# Patient Record
Sex: Male | Born: 1940 | Race: White | Hispanic: No | Marital: Married | Smoking: Former smoker
Health system: Southern US, Community
[De-identification: ages and names within clinical notes are randomized; demographics above are authoritative.]

## PROBLEM LIST (undated history)

## (undated) DIAGNOSIS — K219 Gastro-esophageal reflux disease without esophagitis: Secondary | ICD-10-CM

## (undated) DIAGNOSIS — C61 Malignant neoplasm of prostate: Secondary | ICD-10-CM

## (undated) DIAGNOSIS — Z87442 Personal history of urinary calculi: Secondary | ICD-10-CM

## (undated) DIAGNOSIS — IMO0001 Reserved for inherently not codable concepts without codable children: Secondary | ICD-10-CM

## (undated) DIAGNOSIS — K449 Diaphragmatic hernia without obstruction or gangrene: Secondary | ICD-10-CM

## (undated) DIAGNOSIS — E8881 Metabolic syndrome: Secondary | ICD-10-CM

## (undated) DIAGNOSIS — G4700 Insomnia, unspecified: Secondary | ICD-10-CM

## (undated) DIAGNOSIS — Z974 Presence of external hearing-aid: Secondary | ICD-10-CM

## (undated) DIAGNOSIS — K589 Irritable bowel syndrome without diarrhea: Secondary | ICD-10-CM

## (undated) DIAGNOSIS — N189 Chronic kidney disease, unspecified: Secondary | ICD-10-CM

## (undated) DIAGNOSIS — I350 Nonrheumatic aortic (valve) stenosis: Secondary | ICD-10-CM

## (undated) DIAGNOSIS — I10 Essential (primary) hypertension: Secondary | ICD-10-CM

## (undated) DIAGNOSIS — H9190 Unspecified hearing loss, unspecified ear: Secondary | ICD-10-CM

## (undated) DIAGNOSIS — E669 Obesity, unspecified: Secondary | ICD-10-CM

## (undated) DIAGNOSIS — N200 Calculus of kidney: Secondary | ICD-10-CM

## (undated) DIAGNOSIS — I447 Left bundle-branch block, unspecified: Secondary | ICD-10-CM

## (undated) DIAGNOSIS — M1990 Unspecified osteoarthritis, unspecified site: Secondary | ICD-10-CM

## (undated) DIAGNOSIS — E785 Hyperlipidemia, unspecified: Secondary | ICD-10-CM

## (undated) DIAGNOSIS — F17201 Nicotine dependence, unspecified, in remission: Secondary | ICD-10-CM

## (undated) DIAGNOSIS — E039 Hypothyroidism, unspecified: Secondary | ICD-10-CM

## (undated) DIAGNOSIS — J302 Other seasonal allergic rhinitis: Secondary | ICD-10-CM

## (undated) DIAGNOSIS — R011 Cardiac murmur, unspecified: Secondary | ICD-10-CM

## (undated) DIAGNOSIS — I639 Cerebral infarction, unspecified: Secondary | ICD-10-CM

## (undated) DIAGNOSIS — K6289 Other specified diseases of anus and rectum: Secondary | ICD-10-CM

## (undated) DIAGNOSIS — N400 Enlarged prostate without lower urinary tract symptoms: Secondary | ICD-10-CM

## (undated) DIAGNOSIS — K5790 Diverticulosis of intestine, part unspecified, without perforation or abscess without bleeding: Secondary | ICD-10-CM

## (undated) DIAGNOSIS — K649 Unspecified hemorrhoids: Secondary | ICD-10-CM

## (undated) HISTORY — DX: Hyperlipidemia, unspecified: E78.5

## (undated) HISTORY — DX: Metabolic syndrome: E88.810

## (undated) HISTORY — DX: Metabolic syndrome: E88.81

## (undated) HISTORY — DX: Nicotine dependence, unspecified, in remission: F17.201

## (undated) HISTORY — DX: Essential (primary) hypertension: I10

## (undated) HISTORY — DX: Nonrheumatic aortic (valve) stenosis: I35.0

## (undated) HISTORY — PX: ANKLE FRACTURE SURGERY: SHX122

## (undated) HISTORY — DX: Chronic kidney disease, unspecified: N18.9

## (undated) HISTORY — DX: Calculus of kidney: N20.0

## (undated) HISTORY — PX: TONSILLECTOMY: SUR1361

## (undated) HISTORY — DX: Insomnia, unspecified: G47.00

## (undated) HISTORY — DX: Benign prostatic hyperplasia without lower urinary tract symptoms: N40.0

## (undated) HISTORY — DX: Diverticulosis of intestine, part unspecified, without perforation or abscess without bleeding: K57.90

## (undated) HISTORY — DX: Left bundle-branch block, unspecified: I44.7

## (undated) HISTORY — DX: Obesity, unspecified: E66.9

## (undated) HISTORY — PX: CHOLECYSTECTOMY: SHX55

## (undated) HISTORY — DX: Diaphragmatic hernia without obstruction or gangrene: K44.9

## (undated) HISTORY — DX: Other specified diseases of anus and rectum: K62.89

## (undated) HISTORY — DX: Malignant neoplasm of prostate: C61

## (undated) HISTORY — DX: Unspecified hemorrhoids: K64.9

## (undated) HISTORY — PX: CATARACT EXTRACTION: SUR2

---

## 1998-05-26 DIAGNOSIS — I350 Nonrheumatic aortic (valve) stenosis: Secondary | ICD-10-CM

## 1998-05-26 HISTORY — DX: Nonrheumatic aortic (valve) stenosis: I35.0

## 2001-04-19 ENCOUNTER — Encounter: Payer: Self-pay | Admitting: Family Medicine

## 2001-04-19 ENCOUNTER — Ambulatory Visit (HOSPITAL_COMMUNITY): Admission: RE | Admit: 2001-04-19 | Discharge: 2001-04-19 | Payer: Self-pay | Admitting: Family Medicine

## 2004-03-07 ENCOUNTER — Encounter (HOSPITAL_COMMUNITY): Admission: RE | Admit: 2004-03-07 | Discharge: 2004-04-06 | Payer: Self-pay | Admitting: Family Medicine

## 2004-05-26 DIAGNOSIS — K649 Unspecified hemorrhoids: Secondary | ICD-10-CM

## 2004-05-26 DIAGNOSIS — K449 Diaphragmatic hernia without obstruction or gangrene: Secondary | ICD-10-CM

## 2004-05-26 HISTORY — PX: COLONOSCOPY: SHX174

## 2004-05-26 HISTORY — PX: ESOPHAGOGASTRODUODENOSCOPY: SHX1529

## 2004-05-26 HISTORY — DX: Unspecified hemorrhoids: K64.9

## 2004-05-26 HISTORY — DX: Diaphragmatic hernia without obstruction or gangrene: K44.9

## 2004-08-13 ENCOUNTER — Encounter: Admission: RE | Admit: 2004-08-13 | Discharge: 2004-08-13 | Payer: Self-pay | Admitting: Gastroenterology

## 2004-08-30 ENCOUNTER — Encounter: Admission: RE | Admit: 2004-08-30 | Discharge: 2004-08-30 | Payer: Self-pay | Admitting: Gastroenterology

## 2005-03-05 ENCOUNTER — Other Ambulatory Visit: Admission: RE | Admit: 2005-03-05 | Discharge: 2005-03-05 | Payer: Self-pay | Admitting: Dermatology

## 2006-03-11 ENCOUNTER — Ambulatory Visit (HOSPITAL_COMMUNITY): Admission: RE | Admit: 2006-03-11 | Discharge: 2006-03-11 | Payer: Self-pay | Admitting: Urology

## 2009-08-24 DIAGNOSIS — N200 Calculus of kidney: Secondary | ICD-10-CM

## 2009-08-24 HISTORY — DX: Calculus of kidney: N20.0

## 2009-08-26 ENCOUNTER — Emergency Department (HOSPITAL_COMMUNITY): Admission: EM | Admit: 2009-08-26 | Discharge: 2009-08-26 | Payer: Self-pay | Admitting: Emergency Medicine

## 2009-08-26 ENCOUNTER — Encounter: Payer: Self-pay | Admitting: Cardiology

## 2009-08-26 LAB — CONVERTED CEMR LAB
CO2: 24 meq/L
Calcium: 9.7 mg/dL
Chloride: 104 meq/L
HCT: 45.8 %
Hemoglobin: 15.8 g/dL
Platelets: 205 10*3/uL
WBC: 11 10*3/uL

## 2010-05-26 DIAGNOSIS — I447 Left bundle-branch block, unspecified: Secondary | ICD-10-CM

## 2010-05-26 HISTORY — DX: Left bundle-branch block, unspecified: I44.7

## 2010-06-03 ENCOUNTER — Ambulatory Visit (HOSPITAL_COMMUNITY)
Admission: RE | Admit: 2010-06-03 | Discharge: 2010-06-03 | Payer: Self-pay | Source: Home / Self Care | Attending: Family Medicine | Admitting: Family Medicine

## 2010-06-06 ENCOUNTER — Encounter (INDEPENDENT_AMBULATORY_CARE_PROVIDER_SITE_OTHER): Payer: Self-pay | Admitting: *Deleted

## 2010-06-06 LAB — CONVERTED CEMR LAB
ALT: 42 units/L
AST: 27 units/L
Alkaline Phosphatase: 60 units/L
BUN: 15 mg/dL
Calcium: 9.4 mg/dL
Chloride: 105 meq/L
Cholesterol: 170 mg/dL
Creatinine, Ser: 1.15 mg/dL
Hgb A1c MFr Bld: 5.9 %
Sodium: 140 meq/L
Triglycerides: 177 mg/dL

## 2010-06-28 ENCOUNTER — Encounter (INDEPENDENT_AMBULATORY_CARE_PROVIDER_SITE_OTHER): Payer: Self-pay | Admitting: *Deleted

## 2010-07-02 ENCOUNTER — Encounter: Payer: Self-pay | Admitting: Cardiology

## 2010-07-02 ENCOUNTER — Ambulatory Visit (INDEPENDENT_AMBULATORY_CARE_PROVIDER_SITE_OTHER): Payer: Medicare Other | Admitting: Cardiology

## 2010-07-02 DIAGNOSIS — R9431 Abnormal electrocardiogram [ECG] [EKG]: Secondary | ICD-10-CM

## 2010-07-02 DIAGNOSIS — I359 Nonrheumatic aortic valve disorder, unspecified: Secondary | ICD-10-CM

## 2010-07-02 DIAGNOSIS — E669 Obesity, unspecified: Secondary | ICD-10-CM | POA: Insufficient documentation

## 2010-07-02 DIAGNOSIS — N200 Calculus of kidney: Secondary | ICD-10-CM | POA: Insufficient documentation

## 2010-07-02 DIAGNOSIS — G4700 Insomnia, unspecified: Secondary | ICD-10-CM | POA: Insufficient documentation

## 2010-07-03 ENCOUNTER — Encounter: Payer: Self-pay | Admitting: Cardiology

## 2010-07-03 NOTE — Miscellaneous (Signed)
Summary: LABS CMP,LIPIDS,A1C,06/06/2009  Clinical Lists Changes  Observations: Added new observation of CALCIUM: 9.4 mg/dL (60/45/4098 11:91) Added new observation of ALBUMIN: 4.2 g/dL (47/82/9562 13:08) Added new observation of PROTEIN, TOT: 6.9 g/dL (65/78/4696 29:52) Added new observation of SGPT (ALT): 42 units/L (06/06/2010 10:08) Added new observation of SGOT (AST): 27 units/L (06/06/2010 10:08) Added new observation of ALK PHOS: 60 units/L (06/06/2010 10:08) Added new observation of CREATININE: 1.15 mg/dL (84/13/2440 10:27) Added new observation of BUN: 15 mg/dL (25/36/6440 34:74) Added new observation of BG RANDOM: 122 mg/dL (25/95/6387 56:43) Added new observation of CO2 PLSM/SER: 25 meq/L (06/06/2010 10:08) Added new observation of CL SERUM: 105 meq/L (06/06/2010 10:08) Added new observation of K SERUM: 4.3 meq/L (06/06/2010 10:08) Added new observation of NA: 140 meq/L (06/06/2010 10:08) Added new observation of LDL: 101 mg/dL (32/95/1884 16:60) Added new observation of HDL: 34 mg/dL (63/05/6008 93:23) Added new observation of TRIGLYC TOT: 177 mg/dL (55/73/2202 54:27) Added new observation of CHOLESTEROL: 170 mg/dL (11/16/7626 31:51) Added new observation of HGBA1C: 5.9 % (06/06/2010 10:08)

## 2010-07-17 NOTE — Assessment & Plan Note (Signed)
Summary: [facility]   Visit Type:  Initial Consult Primary Provider:  Dr. Katharine Look   History of Present Illness: [name] is seen at the kind request of Dr. Sudie Bailey for evaluation of aortic valve disease and left bundle branch block.  This nice gentleman was previously seen by Dr. Daleen Squibb for approximately 10 years ago after experiencing an episode of diaphoresis, nausea and near syncope during a highly stressful conversation at work.  Noninvasive assessment at that time was apparently normal.  Records have been requested, but are not yet available.  Since then, he has done quite well.  He has been told of a murmur for some years, but has never previously been told of an abnormal EKG.  No prior EKG tracings are available in the [facility] system.  CT Scan  Procedure date:  08/26/2009  Findings:       CT ABDOMEN AND PELVIS WITHOUT CONTRAST   IMPRESSION:   2-3 mm stone identified in the bladder, just to the right at the   UVJ.  Given the position, this is probably in the bladder lumen   although it is possible it could still be just barely in the UVJ.   There is some edema around the right ureter, but no evidence for   hydronephrosis at this time.    Read By:  Kennith Center,  M.D.  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  THE LEFT VERTEBRAL IS DOMINANT.  THE CAROTID BIFURCATIONS APPEAR UNREMARKABLE.  MILD POSTERIOR WALL PLAQUING OF THE PROXIMAL RIGHT INTERNAL CAROTID ARTERY.  NO SIGNIFICANT STENOSIS IS DETECTED.  THERE IS NO EVIDENCE OF UNUSUAL COURSE OF THE ARTERIES.  NO EVIDENCE OF  DISSECTION.   IMPRESSION:  MILD STENOSIS OF PROXIMAL RIGHT ICA, LESS   THAN 30%     VO:HYWVPX E. Frazier Richards, M.D.  EKG  Procedure date:  07/02/2010  Findings:      Normal sinus rhythm with PVCs Borderline left atrial abnormality Left axis deviation Left bundle branch block No previous tracing for comparison  -  Date:  08/26/2009    WBC: 11    HGB: 15.8    HCT: 45.8    PLT: 205    MCV: 92   Current Medications (verified): 1)  Terazosin Hcl 5 Mg Caps (Terazosin Hcl) .... Take 1 Tab Daily 2)  Alprazolam 0.5 Mg Tabs (Alprazolam) .... Take 1 Tab Qid 3)  Vitamin C Cr 500 Mg Cr-Caps (Ascorbic Acid) .... Take 1 Tab Daily 4)  Acyclovir 800 Mg Tabs (Acyclovir) .... Take 1 Tab Daily 5)  Vitamin D3 1000 Unit Tabs (Cholecalciferol) .... Take 1 Tab Daily 6)  Aspirin 325 Mg Tabs (Aspirin) .... Take 1 Tab Daily 7)  Omega-3 Fish Oil 1200 Mg Caps (Omega-3 Fatty Acids) .... Take 1 Tab Daily 8)  Melatonin 3 Mg Tabs (Melatonin) .... Take 1 Tab Daily 9)  Meclizine Hcl 25 Mg Tabs (Meclizine Hcl) .... Take 1 Tab Daily 10)  Glycopyrrolate 2 Mg Tabs (Glycopyrrolate) .... As Needed 11)  Robinul-Forte 2 Mg Tabs (Glycopyrrolate) .... Take 2 Times Daily  Allergies (verified): No Known Drug Allergies  Comments:  Nurse/Medical Assistant: patient brought med list uses [facility] mail order  [facility] short term [place]  Past History:  Family History: Last updated: 07-14-10 Father: Died at age 80 as a result of myocardial infarction Mother: Died at age 58 as a result of congestive heart failure Siblings: 2 brothers are deceased, one due to cirrhosis and one due to CHF; 2 sisters are alive and well.  Social History: Last updated: 07/14/2010 Employment-retired; previously worked for [employer]; lives locally; 2 children Tobacco Use - No.  Alcohol Use - no Regular Exercise - no Drug Use - no  Past Medical History: Mild aortic stenosis and insufficiency-evaluation  by Dr. 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  Social History: Employment-retired; previously worked for [employer]; lives locally; 2 children Tobacco Use - No.  Alcohol Use - no Regular Exercise - no Drug Use - no  Review of Systems       Requires corrective lenses; occasional positional vertigo when moving her head quickly or in directions; mild prostatism; arthritic discomfort of the fingers, hands and back.  All other systems reviewed and are negative.  Vital Signs:  Patient profile:   70 year old male Height:      66 inches Weight:      180 pounds BMI:     29.16 O2 Sat:      96 % on Room air Pulse rate:   94 / minute BP sitting:   136 / 84  (left arm)  Vitals Entered By: Dreama Saa, CNA 07/14/2010 1:52 PM)  O2 Flow:  Room air  Physical Exam  General:  Overweight but vigorous appearing; well-developed; no acute distress: HEENT-[place]/AT; PERRL; bilateral arcus; EOM intact; conjunctiva and lids nl:  Neck-No JVD; no carotid bruits: Endocrine-No thyromegaly: Lungs-No tachypnea, clear without rales, rhonchi or wheezes: CV-normal PMI; normal S1 and S2; grade 2/6 early to mid peaking systolic ejection murmur at the cardiac base without radiation to the carotids Abdomen-BS normal; soft and non-tender without masses or organomegaly: MS-No deformities, cyanosis or clubbing: Neurologic-Nl cranial nerves;  symmetric strength and tone: Skin- Warm, no sig. 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  This conduction abnormality can be related to aortic stenosis when calcification extends into the left bundle, but certainly is common in the absence of valvular heart disease.  Although it is associated with a somewhat higher risk for coronary disease, no special testing or treatment based solely upon the presence of LBBB is appropriate.  I described the possibility of progression of disease resulting in need for pacemaker at some point in the future, but such an event is of low likelihood.  Problem # 2:  HYPERTENSION (ICD-401.1) Blood pressure control is adequate on a single agent.  Although treatment of hypertension with Hytrin alone is not ideal, this agent is providing benefit in managing symptoms of benign prostatic hypertrophy and  will be continued.  He may well need a second antihypertensive as he ages.  Problem # 3:  HYPERLIPIDEMIA (ICD-272.4) He has mildly elevated triglycerides and depressed HDL.  No pharmacologic therapy is warranted.  Problem # 4:  FASTING HYPERGLYCEMIA (ICD-790.29) Patient has evidence for metabolic syndrome with obesity, insulin resistance, and elevated triglycerides, depressed HDL and mild hypertension.  Weight loss would certainly be desirable.  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  My hope is that progression will be gradual and that intervention will never be required; however, I will plan to follow Calvin Moreno annually for management of his valvular disease as well as his other cardiovascular issues.  Patient Instructions: 1)  Your physician recommends that you schedule a follow-up appointment in: 1 year

## 2010-08-14 LAB — CBC
HCT: 45.8 % (ref 39.0–52.0)
Hemoglobin: 15.8 g/dL (ref 13.0–17.0)
WBC: 11 10*3/uL — ABNORMAL HIGH (ref 4.0–10.5)

## 2010-08-14 LAB — DIFFERENTIAL
Basophils Absolute: 0 10*3/uL (ref 0.0–0.1)
Basophils Relative: 0 % (ref 0–1)
Eosinophils Absolute: 0 10*3/uL (ref 0.0–0.7)
Lymphocytes Relative: 9 % — ABNORMAL LOW (ref 12–46)
Monocytes Absolute: 0.4 10*3/uL (ref 0.1–1.0)
Monocytes Relative: 3 % (ref 3–12)
Neutro Abs: 9.6 10*3/uL — ABNORMAL HIGH (ref 1.7–7.7)

## 2010-08-14 LAB — BASIC METABOLIC PANEL
Calcium: 9.7 mg/dL (ref 8.4–10.5)
Creatinine, Ser: 1.61 mg/dL — ABNORMAL HIGH (ref 0.4–1.5)
GFR calc non Af Amer: 43 mL/min — ABNORMAL LOW (ref >60)

## 2010-08-14 LAB — URINALYSIS, ROUTINE W REFLEX MICROSCOPIC
Hgb urine dipstick: NEGATIVE
Protein, ur: NEGATIVE mg/dL
Urobilinogen, UA: 0.2 mg/dL (ref 0.0–1.0)
pH: 5 (ref 5.0–8.0)

## 2010-11-24 DIAGNOSIS — C61 Malignant neoplasm of prostate: Secondary | ICD-10-CM

## 2010-11-24 HISTORY — DX: Malignant neoplasm of prostate: C61

## 2011-02-12 ENCOUNTER — Ambulatory Visit (HOSPITAL_COMMUNITY)
Admission: RE | Admit: 2011-02-12 | Discharge: 2011-02-12 | Disposition: A | Discharge status: Home / Self Care | Payer: Medicare Other | Source: Ambulatory Visit | Attending: Family Medicine | Admitting: Family Medicine

## 2011-02-12 ENCOUNTER — Other Ambulatory Visit (HOSPITAL_COMMUNITY): Payer: Self-pay | Admitting: Family Medicine

## 2011-02-12 DIAGNOSIS — R05 Cough: Secondary | ICD-10-CM

## 2011-02-12 DIAGNOSIS — R059 Cough, unspecified: Secondary | ICD-10-CM | POA: Insufficient documentation

## 2011-05-12 ENCOUNTER — Encounter: Payer: Self-pay | Admitting: Cardiology

## 2011-05-27 DIAGNOSIS — K5790 Diverticulosis of intestine, part unspecified, without perforation or abscess without bleeding: Secondary | ICD-10-CM

## 2011-05-27 HISTORY — DX: Diverticulosis of intestine, part unspecified, without perforation or abscess without bleeding: K57.90

## 2011-07-08 ENCOUNTER — Encounter: Payer: Self-pay | Admitting: Cardiology

## 2011-07-08 ENCOUNTER — Ambulatory Visit (INDEPENDENT_AMBULATORY_CARE_PROVIDER_SITE_OTHER): Payer: Medicare Other | Admitting: Cardiology

## 2011-07-08 DIAGNOSIS — E785 Hyperlipidemia, unspecified: Secondary | ICD-10-CM | POA: Insufficient documentation

## 2011-07-08 DIAGNOSIS — I10 Essential (primary) hypertension: Secondary | ICD-10-CM | POA: Insufficient documentation

## 2011-07-08 DIAGNOSIS — I350 Nonrheumatic aortic (valve) stenosis: Secondary | ICD-10-CM

## 2011-07-08 DIAGNOSIS — E782 Mixed hyperlipidemia: Secondary | ICD-10-CM | POA: Insufficient documentation

## 2011-07-08 DIAGNOSIS — E8881 Metabolic syndrome: Secondary | ICD-10-CM | POA: Insufficient documentation

## 2011-07-08 DIAGNOSIS — I359 Nonrheumatic aortic valve disorder, unspecified: Secondary | ICD-10-CM

## 2011-07-08 DIAGNOSIS — F17201 Nicotine dependence, unspecified, in remission: Secondary | ICD-10-CM | POA: Insufficient documentation

## 2011-07-08 DIAGNOSIS — I447 Left bundle-branch block, unspecified: Secondary | ICD-10-CM | POA: Insufficient documentation

## 2011-07-08 NOTE — Assessment & Plan Note (Addendum)
Physical examination suggests that aortic valve disease is very mild.  There is no suggestion of symptoms related to AS/AI.  Continued annual reassessments are all that is required at present.

## 2011-07-08 NOTE — Assessment & Plan Note (Signed)
Blood pressure is a bit elevated at this visit, but patient monitors at home and rarely detects systolics above 140.  Accordingly, current therapy will be continued.

## 2011-07-08 NOTE — Assessment & Plan Note (Addendum)
Patient has done well with low to moderate dose statin therapy.  Recent lipid profile reveals optimal results, especially in the absence of documented significant vascular disease.

## 2011-07-08 NOTE — Patient Instructions (Addendum)
Your physician recommends that you schedule a follow-up appointment in: 1 year  Low salt diet (Information included)

## 2011-07-08 NOTE — Progress Notes (Signed)
Patient ID: Calvin Moreno, male   DOB: [birth year], 71 y.o.   MRN: [id number] HPI: Scheduled return visit for this very nice gentleman for further evaluation and treatment of aortic valve disease, hypertension and hyperlipidemia.  Calvin Moreno also has metabolic syndrome, but has not required pharmacologic therapy for mild hyperglycemia.  He has done well since his last visit with fairly good exercise tolerance, no need for urgent medical care and no hospitalizations.  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  Presently, he feels as if he is back to normal.  Prior to Admission medications   Medication Sig Start Date End Date Taking? Authorizing Provider  acyclovir (ZOVIRAX) 800 MG tablet Take 800 mg by mouth daily.     Yes Historical Provider, MD  ALPRAZolam Prudy Feeler) 0.5 MG tablet Take 0.5 mg by mouth 4 (four) times daily.     Yes Historical Provider, MD  aspirin 325 MG tablet Take 325 mg by mouth daily.     Yes Historical Provider, MD  cholecalciferol (VITAMIN D) 1000 UNITS tablet Take 1,000 Units by mouth daily.     Yes Historical Provider, MD  glycopyrrolate (ROBINUL) 2 MG tablet Take 2 mg by mouth as needed.     Yes Historical Provider, MD  meclizine (ANTIVERT) 25 MG tablet Take 25 mg by mouth daily.     Yes Historical Provider, MD  Melatonin 3 MG TABS Take 1 tablet by mouth daily.     Yes Historical Provider, MD  Omega-3 Fatty Acids (OMEGA-3 FISH OIL) 1200 MG CAPS Take 1 capsule by mouth daily.     Yes Historical Provider, MD  simvastatin (ZOCOR) 20 MG tablet Take 20 mg by mouth every evening.   Yes Historical Provider, MD  terazosin (HYTRIN) 5 MG capsule Take 5 mg by mouth at bedtime.     Yes Historical Provider, MD  vitamin C (ASCORBIC ACID) 500 MG tablet Take 500 mg by mouth daily.     Yes Historical Provider, MD     No Known Allergies    Past medical history, social history, and family history reviewed and updated.  ROS: Denies orthopnea, PND, pedal edema, claudication, chest discomfort, exertional dyspnea, palpitations, lightheadedness and syncope.  PHYSICAL EXAM: BP 152/84  Pulse 98  Resp 16  Ht 5\' 6"  (1.676 m)  Wt 82.101 kg (181 lb)  BMI 29.21 kg/m2; 1 pound increase in weight since last office visit General-Well developed; no acute distress Body habitus-overweight Neck-No JVD; no carotid bruits Lungs-clear lung fields; resonant to percussion Cardiovascular-normal PMI; normal S1 and S2; grade 1/6 systolic ejection murmur best heard at the cardiac base Abdomen-normal bowel sounds; soft and non-tender without masses or organomegaly Musculoskeletal-No deformities, no cyanosis or clubbing Neurologic-Normal cranial nerves; symmetric strength and tone Skin-Warm, no significant lesions Extremities-distal pulses intact; trace edema  ASSESSMENT AND PLAN:  Le[place] Bing, MD 07/08/2011 2:12 PM

## 2011-07-09 ENCOUNTER — Encounter: Payer: Self-pay | Admitting: Cardiology

## 2011-11-06 ENCOUNTER — Ambulatory Visit (INDEPENDENT_AMBULATORY_CARE_PROVIDER_SITE_OTHER): Payer: Medicare Other | Admitting: Gastroenterology

## 2011-11-06 ENCOUNTER — Encounter: Payer: Self-pay | Admitting: Gastroenterology

## 2011-11-06 ENCOUNTER — Other Ambulatory Visit: Payer: Self-pay | Admitting: Internal Medicine

## 2011-11-06 VITALS — BP 149/72 | HR 111 | Temp 98.1°F | Ht 66.0 in | Wt 182.0 lb

## 2011-11-06 DIAGNOSIS — R194 Change in bowel habit: Secondary | ICD-10-CM

## 2011-11-06 DIAGNOSIS — K625 Hemorrhage of anus and rectum: Secondary | ICD-10-CM

## 2011-11-06 DIAGNOSIS — R198 Other specified symptoms and signs involving the digestive system and abdomen: Secondary | ICD-10-CM

## 2011-11-06 MED ORDER — PEG 3350-KCL-NA BICARB-NACL 420 G PO SOLR: ORAL | Status: AC

## 2011-11-06 NOTE — Progress Notes (Signed)
Primary Care Physician:  KNOWLTON,STEPHEN D, MD  Primary Gastroenterologist:  Michael Rourk, MD  Chief Complaint  Patient presents with  . Encopresis    HPI:  Calvin Moreno is a 70 y.o. male here at the request of Dr. Knowlton for further evaluation of change in stools.  Alternating normal to loose stools noted since XRT for prostate cancer last year. Occasional brbpr on toilet tissue. Fecal urgency with episodes of incontinence. Symptoms seemed to start with treatments but seemed to get worse over the past few months.  Last TCS in 2007 by Dr. Edwards. Patient states at that time he was having postprandial loose stool. We have requested records. He states he was given Robinul Forte which seemed to help but caused constipation. No melena or recent abx. No heartburn, anorexia, weight loss.    Current Outpatient Prescriptions  Medication Sig Dispense Refill  . acyclovir (ZOVIRAX) 800 MG tablet Take 800 mg by mouth daily.        . ALPRAZolam (XANAX) 0.5 MG tablet Take 0.5 mg by mouth at bedtime as needed.       . aspirin 325 MG tablet Take 325 mg by mouth daily.        . cholecalciferol (VITAMIN D) 1000 UNITS tablet Take 1,000 Units by mouth daily.        . fexofenadine (ALLEGRA) 180 MG tablet Take 180 mg by mouth daily.      . meclizine (ANTIVERT) 25 MG tablet Take 25 mg by mouth daily.        . Melatonin 3 MG TABS Take 1 tablet by mouth daily.        . Omega-3 Fatty Acids (OMEGA-3 FISH OIL) 1200 MG CAPS Take 1 capsule by mouth daily.        . simvastatin (ZOCOR) 20 MG tablet Take 20 mg by mouth every evening.      . terazosin (HYTRIN) 5 MG capsule Take 5 mg by mouth at bedtime.        . vitamin C (ASCORBIC ACID) 500 MG tablet Take 500 mg by mouth daily.          Allergies as of 11/06/2011 - Review Complete 11/06/2011  Allergen Reaction Noted  . Codeine Nausea And Vomiting 11/06/2011   Past Medical History  Diagnosis Date  . Aortic stenosis, mild 2000    And insufficiency,  evaluation by Dr. Wall in 2000  . Hypertension     Mild internal carotid artery plaque on MRI/MRA in 2002  . Hyperlipidemia     Elevated triglycerides  . Left bundle branch block 2012    2012  . Tobacco abuse, in remission     60 pack years; quit in 1999  . Insomnia   . Obesity   . Nephrolithiasis 08/2009    2011  . Chronic kidney disease     Borderline; creatinine of 1.6 in 08/2009, but 1.15 in 05/2010  . Benign prostatic hypertrophy     PSA of 1.39 in 09/2010  . Chronic sinusitis     By MRI  . Metabolic syndrome     Fasting hyperglycemia  . Prostate cancer 11/2010    s/p XRT    Past Surgical History  Procedure Date  . Cholecystectomy   . Tonsillectomy   . Colonoscopy 2007    Dr. Edwards  . Ankle fracture surgery     right    Family History  Problem Relation Age of Onset  . Heart failure Mother   . Heart attack   Father   . Heart failure Brother     bladder cancer (agent orange exposure)  . Cirrhosis Brother     chronic viral hepatitis  . Colon cancer Neg Hx    History   Social History  . Marital Status: Married    Spouse Name: N/A    Number of Children: 2  . Years of Education: N/A   Occupational History  . Retired [employer]   Social History Main Topics  . Smoking status: Former Smoker -- 1.5 packs/day for 40 years    Quit date: 11/04/1997  . Smokeless tobacco: Never Used   Comment: Quit in 1999  . Alcohol Use: No  . Drug Use: No  . Sexually Active: None   Other Topics Concern  . None   Social History Narrative   Married with 2 childrenLives locallyNo regular exercise     ROS:  General: Negative for anorexia, weight loss, fever, chills, fatigue, weakness. Eyes: Negative for vision changes.  ENT: Negative for hoarseness, difficulty swallowing , nasal congestion. CV: Negative for chest pain, angina, palpitations, dyspnea on exertion, peripheral edema.  Respiratory: Negative for dyspnea at rest, dyspnea on exertion, cough, sputum, wheezing.   GI: See history of present illness. GU:  Negative for dysuria, hematuria, urinary incontinence, urinary frequency, nocturnal urination.  MS: Negative for joint pain, low back pain.  Derm: positive for rash on arms, has appt with dermatologist  Neuro: Negative for weakness, abnormal sensation, seizure, frequent headaches, memory loss, confusion.  Psych: Negative for anxiety, depression, suicidal ideation, hallucinations.  Endo: Negative for unusual weight change.  Heme: Negative for bruising or bleeding. Allergy: Negative for rash or hives.    Physical Examination:  BP 149/72  Pulse 111  Temp 98.1 F (36.7 C) (Temporal)  Ht 5' 6" (1.676 m)  Wt 182 lb (82.555 kg)  BMI 29.38 kg/m2   General: Well-nourished, well-developed in no acute distress.  Head: Normocephalic, atraumatic.   Eyes: Conjunctiva pink, no icterus. Mouth: Oropharyngeal mucosa moist and pink , no lesions erythema or exudate. Neck: Supple without thyromegaly, masses, or lymphadenopathy.  Lungs: Clear to auscultation bilaterally.  Heart: Regular rate and rhythm, no murmurs rubs or gallops.  Abdomen: Bowel sounds are normal, nontender, nondistended, no hepatosplenomegaly or masses, no abdominal bruits or    hernia , no rebound or guarding.   Rectal: defer to time of tcs Extremities: No lower extremity edema. No clubbing or deformities.  Neuro: Alert and oriented x 4 , grossly normal neurologically.  Skin: Warm and dry, no rash or jaundice.   Psych: Alert and cooperative, normal mood and affect.   

## 2011-11-07 ENCOUNTER — Encounter: Payer: Self-pay | Admitting: Gastroenterology

## 2011-11-07 NOTE — Assessment & Plan Note (Signed)
Change in bowels since XRT for prostate cancer last year. Now with fecal urgency and episodic incontinence, intermittent rectal bleeding. Patient describes previous postprandial loose stools years ago. Will request copy of prior TCS. Recommend TCS at this time for further evaluation of new symptoms. Suspect radiation proctitis, but need to consider microscopic colitis, IBD.

## 2011-11-07 NOTE — Progress Notes (Signed)
Faxed to PCP

## 2011-11-18 ENCOUNTER — Encounter (HOSPITAL_COMMUNITY): Payer: Self-pay | Admitting: Pharmacy Technician

## 2011-12-03 ENCOUNTER — Encounter (HOSPITAL_COMMUNITY): Payer: Self-pay | Admitting: *Deleted

## 2011-12-03 ENCOUNTER — Ambulatory Visit (HOSPITAL_COMMUNITY)
Admission: RE | Admit: 2011-12-03 | Discharge: 2011-12-03 | Disposition: A | Discharge status: Home / Self Care | Payer: Medicare Other | Source: Ambulatory Visit | Attending: Internal Medicine | Admitting: Internal Medicine

## 2011-12-03 ENCOUNTER — Encounter (HOSPITAL_COMMUNITY)
Admission: RE | Disposition: A | Discharge status: Home / Self Care | Payer: Self-pay | Source: Ambulatory Visit | Attending: Internal Medicine

## 2011-12-03 DIAGNOSIS — K5730 Diverticulosis of large intestine without perforation or abscess without bleeding: Secondary | ICD-10-CM | POA: Insufficient documentation

## 2011-12-03 DIAGNOSIS — E785 Hyperlipidemia, unspecified: Secondary | ICD-10-CM | POA: Insufficient documentation

## 2011-12-03 DIAGNOSIS — R197 Diarrhea, unspecified: Secondary | ICD-10-CM | POA: Insufficient documentation

## 2011-12-03 DIAGNOSIS — K6289 Other specified diseases of anus and rectum: Secondary | ICD-10-CM | POA: Insufficient documentation

## 2011-12-03 DIAGNOSIS — K921 Melena: Secondary | ICD-10-CM | POA: Insufficient documentation

## 2011-12-03 DIAGNOSIS — I10 Essential (primary) hypertension: Secondary | ICD-10-CM | POA: Insufficient documentation

## 2011-12-03 DIAGNOSIS — K625 Hemorrhage of anus and rectum: Secondary | ICD-10-CM

## 2011-12-03 DIAGNOSIS — Z79899 Other long term (current) drug therapy: Secondary | ICD-10-CM | POA: Insufficient documentation

## 2011-12-03 DIAGNOSIS — R194 Change in bowel habit: Secondary | ICD-10-CM

## 2011-12-03 HISTORY — DX: Unspecified osteoarthritis, unspecified site: M19.90

## 2011-12-03 HISTORY — PX: COLONOSCOPY: SHX5424

## 2011-12-03 HISTORY — DX: Cerebral infarction, unspecified: I63.9

## 2011-12-03 HISTORY — DX: Irritable bowel syndrome, unspecified: K58.9

## 2011-12-03 HISTORY — PX: OTHER SURGICAL HISTORY: SHX169

## 2011-12-03 HISTORY — DX: Cardiac murmur, unspecified: R01.1

## 2011-12-03 SURGERY — COLONOSCOPY
Anesthesia: Moderate Sedation

## 2011-12-03 MED ORDER — MEPERIDINE HCL 100 MG/ML IJ SOLN
INTRAMUSCULAR | Status: AC
  Filled 2011-12-03: qty 1

## 2011-12-03 MED ORDER — MIDAZOLAM HCL 5 MG/5ML IJ SOLN
INTRAMUSCULAR | Status: DC | PRN
  Administered 2011-12-03: 2 mg via INTRAVENOUS
  Administered 2011-12-03: 0.5 mg via INTRAVENOUS
  Administered 2011-12-03 (×3): 1 mg via INTRAVENOUS

## 2011-12-03 MED ORDER — STERILE WATER FOR IRRIGATION IR SOLN
Status: DC | PRN
  Administered 2011-12-03: 09:00:00

## 2011-12-03 MED ORDER — MIDAZOLAM HCL 5 MG/5ML IJ SOLN
INTRAMUSCULAR | Status: AC
  Filled 2011-12-03: qty 10

## 2011-12-03 MED ORDER — SODIUM CHLORIDE 0.45 % IV SOLN
Freq: Once | INTRAVENOUS | Status: AC
  Administered 2011-12-03: 1000 mL via INTRAVENOUS

## 2011-12-03 MED ORDER — MEPERIDINE HCL 100 MG/ML IJ SOLN
INTRAMUSCULAR | Status: DC | PRN
  Administered 2011-12-03: 25 mg via INTRAVENOUS
  Administered 2011-12-03: 50 mg via INTRAVENOUS
  Administered 2011-12-03 (×2): 25 mg via INTRAVENOUS

## 2011-12-03 NOTE — H&P (View-Only) (Signed)
Primary Care Physician:  Milana Obey, MD  Primary Gastroenterologist:  Roetta Sessions, MD  Chief Complaint  Patient presents with  . Encopresis    HPI:  Calvin Moreno is a 71 y.o. male here at the request of Dr. Sudie Bailey for further evaluation of change in stools.  Alternating normal to loose stools noted since XRT for prostate cancer last year. Occasional brbpr on toilet tissue. Fecal urgency with episodes of incontinence. Symptoms seemed to start with treatments but seemed to get worse over the past few months.  Last TCS in 2007 by Dr. Randa Evens. Patient states at that time he was having postprandial loose stool. We have requested records. He states he was given Robinul Forte which seemed to help but caused constipation. No melena or recent abx. No heartburn, anorexia, weight loss.    Current Outpatient Prescriptions  Medication Sig Dispense Refill  . acyclovir (ZOVIRAX) 800 MG tablet Take 800 mg by mouth daily.        Marland Kitchen ALPRAZolam (XANAX) 0.5 MG tablet Take 0.5 mg by mouth at bedtime as needed.       Marland Kitchen aspirin 325 MG tablet Take 325 mg by mouth daily.        . cholecalciferol (VITAMIN D) 1000 UNITS tablet Take 1,000 Units by mouth daily.        . fexofenadine (ALLEGRA) 180 MG tablet Take 180 mg by mouth daily.      . meclizine (ANTIVERT) 25 MG tablet Take 25 mg by mouth daily.        . Melatonin 3 MG TABS Take 1 tablet by mouth daily.        . Omega-3 Fatty Acids (OMEGA-3 FISH OIL) 1200 MG CAPS Take 1 capsule by mouth daily.        . simvastatin (ZOCOR) 20 MG tablet Take 20 mg by mouth every evening.      . terazosin (HYTRIN) 5 MG capsule Take 5 mg by mouth at bedtime.        . vitamin C (ASCORBIC ACID) 500 MG tablet Take 500 mg by mouth daily.          Allergies as of 11/06/2011 - Review Complete 11/06/2011  Allergen Reaction Noted  . Codeine Nausea And Vomiting 11/06/2011   Past Medical History  Diagnosis Date  . Aortic stenosis, mild 2000    And insufficiency,  evaluation by Dr. Daleen Squibb in 2000  . Hypertension     Mild internal carotid artery plaque on MRI/MRA in 2002  . Hyperlipidemia     Elevated triglycerides  . Left bundle branch block 2012    2012  . Tobacco abuse, in remission     60 pack years; quit in 1999  . Insomnia   . Obesity   . Nephrolithiasis 08/2009    2011  . Chronic kidney disease     Borderline; creatinine of 1.6 in 08/2009, but 1.15 in 05/2010  . Benign prostatic hypertrophy     PSA of 1.39 in 09/2010  . Chronic sinusitis     By MRI  . Metabolic syndrome     Fasting hyperglycemia  . Prostate cancer 11/2010    s/p XRT    Past Surgical History  Procedure Date  . Cholecystectomy   . Tonsillectomy   . Colonoscopy 2007    Dr. Randa Evens  . Ankle fracture surgery     right    Family History  Problem Relation Age of Onset  . Heart failure Mother   . Heart attack  Father   . Heart failure Brother     bladder cancer (agent orange exposure)  . Cirrhosis Brother     chronic viral hepatitis  . Colon cancer Neg Hx    History   Social History  . Marital Status: Married    Spouse Name: N/A    Number of Children: 2  . Years of Education: N/A   Occupational History  . Retired [employer]   Social History Main Topics  . Smoking status: Former Smoker -- 1.5 packs/day for 40 years    Quit date: 11/04/1997  . Smokeless tobacco: Never Used   Comment: Quit in 1999  . Alcohol Use: No  . Drug Use: No  . Sexually Active: None   Other Topics Concern  . None   Social History Narrative   Married with 2 childrenLives locallyNo regular exercise     ROS:  General: Negative for anorexia, weight loss, fever, chills, fatigue, weakness. Eyes: Negative for vision changes.  ENT: Negative for hoarseness, difficulty swallowing , nasal congestion. CV: Negative for chest pain, angina, palpitations, dyspnea on exertion, peripheral edema.  Respiratory: Negative for dyspnea at rest, dyspnea on exertion, cough, sputum, wheezing.   GI: See history of present illness. GU:  Negative for dysuria, hematuria, urinary incontinence, urinary frequency, nocturnal urination.  MS: Negative for joint pain, low back pain.  Derm: positive for rash on arms, has appt with dermatologist  Neuro: Negative for weakness, abnormal sensation, seizure, frequent headaches, memory loss, confusion.  Psych: Negative for anxiety, depression, suicidal ideation, hallucinations.  Endo: Negative for unusual weight change.  Heme: Negative for bruising or bleeding. Allergy: Negative for rash or hives.    Physical Examination:  BP 149/72  Pulse 111  Temp 98.1 F (36.7 C) (Temporal)  Ht 5\' 6"  (1.676 m)  Wt 182 lb (82.555 kg)  BMI 29.38 kg/m2   General: Well-nourished, well-developed in no acute distress.  Head: Normocephalic, atraumatic.   Eyes: Conjunctiva pink, no icterus. Mouth: Oropharyngeal mucosa moist and pink , no lesions erythema or exudate. Neck: Supple without thyromegaly, masses, or lymphadenopathy.  Lungs: Clear to auscultation bilaterally.  Heart: Regular rate and rhythm, no murmurs rubs or gallops.  Abdomen: Bowel sounds are normal, nontender, nondistended, no hepatosplenomegaly or masses, no abdominal bruits or    hernia , no rebound or guarding.   Rectal: defer to time of tcs Extremities: No lower extremity edema. No clubbing or deformities.  Neuro: Alert and oriented x 4 , grossly normal neurologically.  Skin: Warm and dry, no rash or jaundice.   Psych: Alert and cooperative, normal mood and affect.

## 2011-12-03 NOTE — Progress Notes (Signed)
Ambulated to BR to expel lots of flatus, thereby relieving abd distension.

## 2011-12-03 NOTE — Op Note (Signed)
[facility] [address] [place], [place]  [address]  COLONOSCOPY PROCEDURE REPORT  PATIENT:  Calvin Moreno, Calvin Moreno  MR#:  [id number] BIRTHDATE:  1940/08/15, 70 yrs. old  GENDER:  male ENDOSCOPIST:  R. Roetta Sessions, MD FACP [facility] REF. BY:  Gareth Morgan, M.D. PROCEDURE DATE:  12/03/2011 PROCEDURE:  Ileocolonoscopy with segmental biopsy and APC ablation of rectal lesion  INDICATIONS:  Hematochezia; diarrhea; intermittent incontinence status post radiation therapy for prostate cancer  INFORMED CONSENT:  The risks, benefits, alternatives and imponderables including but not limited to bleeding, perforation as well as the possibility of a missed lesion have been reviewed. The potential for biopsy, lesion removal, etc. have also been discussed.  Questions have been answered.  All parties agreeable. Please see the history and physical in the medical record for more information.  MEDICATIONS:  Demerol 125 mg IV and Versed 5.5 mg IV in divided doses  DESCRIPTION OF PROCEDURE:  After a digital rectal exam was performed, the EC-[id number] ([id number]) colonoscope was advanced from the anus through the rectum and colon to the area of the cecum, ileocecal valve and appendiceal orifice.  The cecum was deeply intubated.  These structures were well-seen and photographed for the record.  From the level of the cecum and ileocecal valve, the scope was slowly and cautiously withdrawn.  The mucosal surfaces were carefully surveyed utilizing scope tip deflection to facilitate fold flattening as needed.  The scope was pulled down into the rectum where a thorough examination including retroflexion was performed. <<PROCEDUREIMAGES>>  FINDINGS:  Adequate preparation. Neovascular changes involving the distal 2 cm of rectal mucosa circumferentially with some friability. 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 Remainder of the rectum appeared normal. Sigmoid diverticula;  the remainder of the colonic mucosa as well as the distal 10 cm of terminal ileal mucosa appeared normal.  THERAPEUTIC / DIAGNOSTIC MANEUVERS PERFORMED:   Biopsies of the Ascending and sigmoid segments were taken to rule out microscopic colitis.    Subsequently, utilizing the APC circular probe on rectal settings multiple applications at 20 J each were applied to the neovascular changes in the distal rectum. Blood vessels were sealed nicely without apparent complication and no bleeding.  COMPLICATIONS:  None  CECAL WITHDRAWAL TIME: 14 minutes  IMPRESSION:  Colonic diverticulosis. 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 Status post segmental colon Biopsy  RECOMMENDATIONS:  Will make further recommendations once pathology is available for review. Patient will likely benefit from a combination daily fiber and resin binder therapy if there is no evidence of microscopic colitis.  ______________________________ R. Roetta Sessions, MD Caleen Essex  CC:  Gareth Morgan, M.D.  n. eSIGNED:   R. Roetta Sessions at 12/03/2011 09:54 AM  Elna Breslow, [phone]

## 2011-12-03 NOTE — Interval H&P Note (Signed)
History and Physical Interval Note:  12/03/2011 8:54 AM  [name]  has presented today for surgery, with the diagnosis of RECTAL BLEED, CHANGE IN BOWEL HABITS  The various methods of treatment have been discussed with the patient and family. After consideration of risks, benefits and other options for treatment, the patient has consented to  Procedure(s) (LRB): COLONOSCOPY (N/A) as a surgical intervention .  The patient's history has been reviewed, patient examined, no change in status, stable for surgery.  I have reviewed the patients' chart and labs.  Questions were answered to the patient's satisfaction.     Calvin Moreno

## 2011-12-08 ENCOUNTER — Encounter (HOSPITAL_COMMUNITY): Payer: Self-pay | Admitting: Internal Medicine

## 2011-12-09 ENCOUNTER — Encounter: Payer: Self-pay | Admitting: Internal Medicine

## 2011-12-09 NOTE — Progress Notes (Unsigned)
rx called to [facility].

## 2011-12-09 NOTE — Progress Notes (Unsigned)
  Pt said he really needs to talk with you personally on the phone. He has multiple questions and wants to get to the bottom of his problems. His phone number is [phone] jl

## 2011-12-09 NOTE — Progress Notes (Unsigned)
Per RMR-Discussed the findings of recent colonoscopy with patient. He is having a little bit of bloody mucous-like discharge with bowel movements. He says he's not constipated. After some discussion, I have decided to hold off on Questran. We'll give him a one-month course of Canasa suppositories (1 Gram per rectum at bedtime) x30 days. No refills. Office visit with [place] in one month to reassess. I told him he may need another APC treatment later this year.

## 2011-12-22 ENCOUNTER — Encounter: Payer: Self-pay | Admitting: Internal Medicine

## 2011-12-30 ENCOUNTER — Ambulatory Visit: Payer: Medicare Other | Admitting: Internal Medicine

## 2012-01-20 ENCOUNTER — Ambulatory Visit: Payer: Medicare Other | Admitting: Internal Medicine

## 2012-02-03 ENCOUNTER — Ambulatory Visit (INDEPENDENT_AMBULATORY_CARE_PROVIDER_SITE_OTHER): Payer: Medicare Other | Admitting: Internal Medicine

## 2012-02-03 ENCOUNTER — Encounter: Payer: Self-pay | Admitting: Internal Medicine

## 2012-02-03 VITALS — BP 137/79 | HR 99 | Temp 98.2°F | Ht 66.0 in | Wt 181.2 lb

## 2012-02-03 DIAGNOSIS — K6289 Other specified diseases of anus and rectum: Secondary | ICD-10-CM

## 2012-02-03 DIAGNOSIS — K589 Irritable bowel syndrome without diarrhea: Secondary | ICD-10-CM

## 2012-02-03 DIAGNOSIS — K627 Radiation proctitis: Secondary | ICD-10-CM

## 2012-02-03 NOTE — Patient Instructions (Signed)
Use Levsin - one under the tongue before a meal as needed  Office visit with me in 3 months  Call in the interim if you don't see steady improvement

## 2012-02-03 NOTE — Progress Notes (Signed)
Primary Care Physician:  Milana Obey, MD Primary Gastroenterologist:  Dr. Jena Gauss  Pre-Procedure History & Physical: HPI:  Calvin Moreno is a 71 y.o. male here for radiation induced proctitis. Status post APC treatment of the proctitis back in June/July. Course of mesalamine suppositories add. Biopsies were negative for microscopic colitis. All in all, patient is doing better. He does have baseline symptoms of intermittent postprandial abdominal cramps and nonbloody diarrhea for years Dr. Randa Evens previously gave him Robinul Forte. He says he took some of his old [place] recently seem to help him bowel function he been more. Has 2-3 bowel movements daily blood per rectum this steadily tapered off. No real abdominal pain. No history of urinary difficulty or glaucoma.  Past Medical History  Diagnosis Date  . Aortic stenosis, mild 2000    And insufficiency, evaluation by Dr. Daleen Squibb in 2000  . Hypertension     Mild internal carotid artery plaque on MRI/MRA in 2002  . Hyperlipidemia     Elevated triglycerides  . Left bundle branch block 2012    2012  . Tobacco abuse, in remission     60 pack years; quit in 1999  . Insomnia   . Obesity   . Nephrolithiasis 08/2009    2011  . Chronic kidney disease     Borderline; creatinine of 1.6 in 08/2009, but 1.15 in 05/2010  . Benign prostatic hypertrophy     PSA of 1.39 in 09/2010  . Chronic sinusitis     By MRI  . Metabolic syndrome     Fasting hyperglycemia  . Prostate cancer 11/2010    s/p XRT  . Murmur   . Stroke     TIA  . IBS (irritable bowel syndrome)   . Arthritis   . Diverticulosis   . Proctitis     radiation induced    Past Surgical History  Procedure Date  . Cholecystectomy   . Tonsillectomy   . Colonoscopy 2007    Dr. Randa Evens  . Ankle fracture surgery     left  . Colonoscopy 12/03/2011    Dr. Jena Gauss- colonic diverticulosis, radiation-induced proctitis- s/p APC ablation, no microscopic colitis on bx    Prior to Admission  medications   Medication Sig Start Date End Date Taking? Authorizing Provider  acyclovir (ZOVIRAX) 800 MG tablet Take 800 mg by mouth daily.     Yes Historical Provider, MD  ALPRAZolam Prudy Feeler) 0.5 MG tablet Take 0.5 mg by mouth at bedtime as needed. For sleep   Yes Historical Provider, MD  aspirin 325 MG tablet Take 325 mg by mouth daily.     Yes Historical Provider, MD  cholecalciferol (VITAMIN D) 1000 UNITS tablet Take 1,000 Units by mouth daily.     Yes Historical Provider, MD  fexofenadine (ALLEGRA) 180 MG tablet Take 180 mg by mouth daily as needed. For allergies   Yes Historical Provider, MD  glycopyrrolate (ROBINUL) 2 MG tablet Take 2 mg by mouth daily.   Yes Historical Provider, MD  meclizine (ANTIVERT) 25 MG tablet Take 25 mg by mouth at bedtime.    Yes Historical Provider, MD  Melatonin 3 MG TABS Take 1 tablet by mouth at bedtime as needed. For sleep   Yes Historical Provider, MD  Omega-3 Fatty Acids (OMEGA-3 FISH OIL) 1200 MG CAPS Take 1 capsule by mouth daily.     Yes Historical Provider, MD  simvastatin (ZOCOR) 20 MG tablet Take 20 mg by mouth every evening.   Yes Historical Provider, MD  terazosin (HYTRIN) 5 MG capsule Take 5 mg by mouth at bedtime.     Yes Historical Provider, MD  vitamin C (ASCORBIC ACID) 500 MG tablet Take 500 mg by mouth daily.     Yes Historical Provider, MD    Allergies as of 02/03/2012 - Review Complete 02/03/2012  Allergen Reaction Noted  . Codeine Nausea And Vomiting 11/06/2011    Family History  Problem Relation Age of Onset  . Heart failure Mother   . Heart attack Father   . Heart failure Brother     bladder cancer (agent orange exposure)  . Cirrhosis Brother     chronic viral hepatitis  . Colon cancer Neg Hx     History   Social History  . Marital Status: Married    Spouse Name: N/A    Number of Children: 2  . Years of Education: N/A   Occupational History  . Retired [employer]   Social History Main Topics  . Smoking  status: Former Smoker -- 1.5 packs/day for 40 years    Quit date: 11/04/1997  . Smokeless tobacco: Never Used   Comment: Quit in 1999  . Alcohol Use: No  . Drug Use: No  . Sexually Active: Not on file   Other Topics Concern  . Not on file   Social History Narrative   Married with 2 childrenLives locallyNo regular exercise    Review of Systems: See HPI, otherwise negative ROS  Physical Exam: BP 137/79  Pulse 99  Temp 98.2 F (36.8 C) (Temporal)  Ht 5\' 6"  (1.676 m)  Wt 181 lb 3.2 oz (82.192 kg)  BMI 29.25 kg/m2 General:   Alert,  Well-developed, well-nourished, pleasant and cooperative in NAD Skin:  Intact without significant lesions or rashes. Eyes:  Sclera clear, no icterus.   Conjunctiva pink. Ears:  Normal auditory acuity. Abdomen: Non-distended, normal bowel sounds.  Soft and nontender without appreciable mass or hepatosplenomegaly.    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 Doing better since APC ablation and mesalamine therapy topically. He wants additional help with slowing urgency down which is basically going back to some of his IBS symptoms.  Recommendations: A trial of Levsin 0.125 mg tablets sublingually or orally before meals as needed.  Office visit with me in 3 months. If he does not experience any improvement, he is to let me know in the interim.

## 2012-03-10 ENCOUNTER — Other Ambulatory Visit: Payer: Self-pay

## 2012-03-10 MED ORDER — HYOSCYAMINE SULFATE 0.125 MG SL SUBL: 0.1250 mg | SUBLINGUAL_TABLET | SUBLINGUAL | Status: DC | PRN

## 2012-04-26 ENCOUNTER — Encounter: Payer: Self-pay | Admitting: *Deleted

## 2012-06-18 ENCOUNTER — Ambulatory Visit (INDEPENDENT_AMBULATORY_CARE_PROVIDER_SITE_OTHER): Payer: Medicare Other | Admitting: Internal Medicine

## 2012-06-18 ENCOUNTER — Encounter: Payer: Self-pay | Admitting: Internal Medicine

## 2012-06-18 VITALS — BP 147/79 | HR 91 | Temp 98.4°F | Ht 66.0 in | Wt 182.4 lb

## 2012-06-18 DIAGNOSIS — K589 Irritable bowel syndrome without diarrhea: Secondary | ICD-10-CM

## 2012-06-18 DIAGNOSIS — K6289 Other specified diseases of anus and rectum: Secondary | ICD-10-CM

## 2012-06-18 DIAGNOSIS — K627 Radiation proctitis: Secondary | ICD-10-CM

## 2012-06-18 DIAGNOSIS — K580 Irritable bowel syndrome with diarrhea: Secondary | ICD-10-CM

## 2012-06-18 NOTE — Progress Notes (Signed)
Primary Care Physician:  Milana Obey, MD Primary Gastroenterologist:  Dr. Jena Gauss  Pre-Procedure History & Physical: HPI:  Calvin Moreno is a 72 y.o. male here for followup of IBS-D. and radiation proctitis. Patient reports some decrease in rectal bleeding with harder stools over the past few months. Continues to have intermittent bouts of abdominal cramps and nonbloody diarrhea. Occasional bouts of rectal incontinence. Patient did not think Levsin helped. We recommended Questran previously but patient denies taking.  Dr. Randa Evens endoscopy reports reviewed. He also apparently made a diagnosis of irritable bowel syndrome.  Past Medical History  Diagnosis Date  . Aortic stenosis, mild 2000    And insufficiency, evaluation by Dr. Daleen Squibb in 2000  . Hypertension     Mild internal carotid artery plaque on MRI/MRA in 2002  . Hyperlipidemia     Elevated triglycerides  . Left bundle branch block 2012    2012  . Tobacco abuse, in remission     60 pack years; quit in 1999  . Insomnia   . Obesity   . Nephrolithiasis 08/2009    2011  . Chronic kidney disease     Borderline; creatinine of 1.6 in 08/2009, but 1.15 in 05/2010  . Benign prostatic hypertrophy     PSA of 1.39 in 09/2010  . Chronic sinusitis     By MRI  . Metabolic syndrome     Fasting hyperglycemia  . Prostate cancer 11/2010    s/p XRT  . Murmur   . Stroke     TIA  . IBS (irritable bowel syndrome)   . Arthritis   . Diverticulosis 2013  . Proctitis     radiation induced  . Hiatal hernia 2006  . Hemorrhoids 2006    Past Surgical History  Procedure Date  . Cholecystectomy   . Tonsillectomy   . Colonoscopy 2006    Dr. Henry Russel mucosa throughout colon, moderate internal hemorrhoids. bx= benign colonic mucosa  . Ankle fracture surgery     left  . Colonoscopy 12/03/2011    Dr. Jena Gauss- colonic diverticulosis, radiation-induced proctitis- s/p APC ablation, no microscopic colitis on bx  . Esophagogastroduodenoscopy 2006     Dr. Randa Evens- small hiatal hernia and slightly reddened distal esophagus o/w normal    Prior to Admission medications   Medication Sig Start Date End Date Taking? Authorizing Provider  acyclovir (ZOVIRAX) 800 MG tablet Take 800 mg by mouth daily.     Yes Historical Provider, MD  ALPRAZolam Prudy Feeler) 0.5 MG tablet Take 0.5 mg by mouth at bedtime as needed. For sleep   Yes Historical Provider, MD  aspirin 325 MG tablet Take 325 mg by mouth daily.     Yes Historical Provider, MD  cholecalciferol (VITAMIN D) 1000 UNITS tablet Take 1,000 Units by mouth daily.     Yes Historical Provider, MD  fexofenadine (ALLEGRA) 180 MG tablet Take 180 mg by mouth daily as needed. For allergies   Yes Historical Provider, MD  glycopyrrolate (ROBINUL) 2 MG tablet Take 2 mg by mouth daily.   Yes Historical Provider, MD  hyoscyamine (LEVSIN SL) 0.125 MG SL tablet Place 1 tablet (0.125 mg total) under the tongue every 4 (four) hours as needed for cramping. 03/10/12  Yes Nira Retort, NP  meclizine (ANTIVERT) 25 MG tablet Take 25 mg by mouth at bedtime.    Yes Historical Provider, MD  Melatonin 3 MG TABS Take 1 tablet by mouth at bedtime as needed. For sleep   Yes Historical Provider, MD  Omega-3  Fatty Acids (OMEGA-3 FISH OIL) 1200 MG CAPS Take 1 capsule by mouth daily.     Yes Historical Provider, MD  simvastatin (ZOCOR) 20 MG tablet Take 20 mg by mouth every evening.   Yes Historical Provider, MD  terazosin (HYTRIN) 5 MG capsule Take 5 mg by mouth at bedtime.     Yes Historical Provider, MD  vitamin C (ASCORBIC ACID) 500 MG tablet Take 500 mg by mouth daily.     Yes Historical Provider, MD    Allergies as of 06/18/2012 - Review Complete 06/18/2012  Allergen Reaction Noted  . Codeine Nausea And Vomiting 11/06/2011    Family History  Problem Relation Age of Onset  . Heart failure Mother   . Heart attack Father   . Heart failure Brother     bladder cancer (agent orange exposure)  . Cirrhosis Brother     chronic  viral hepatitis  . Colon cancer Neg Hx     History   Social History  . Marital Status: Married    Spouse Name: N/A    Number of Children: 2  . Years of Education: N/A   Occupational History  . Retired [employer]   Social History Main Topics  . Smoking status: Former Smoker -- 1.5 packs/day for 40 years    Quit date: 11/04/1997  . Smokeless tobacco: Never Used     Comment: Quit in 1999  . Alcohol Use: No  . Drug Use: No  . Sexually Active: Not on file   Other Topics Concern  . Not on file   Social History Narrative   Married with 2 childrenLives locallyNo regular exercise    Review of Systems: See HPI, otherwise negative ROS  Physical Exam: BP 147/79  Pulse 91  Temp 98.4 F (36.9 C) (Oral)  Ht 5\' 6"  (1.676 m)  Wt 182 lb 6.4 oz (82.736 kg)  BMI 29.44 kg/m2 General:   Alert,  Well-developed, well-nourished, pleasant and cooperative in NAD Detailed examination deferred   Impression/Plan:  Pleasant 72 year old, long-standing IBS-D now with superimposed radiation proctitis. Rectal bleeding has lessened since APC treatment of his inflamed rectal mucosa. He continues to have intermittent abdominal cramps and diarrhea and occasional bouts of incontinence.  Denies trying Questran previously although we previously recommended it. Levsin did not help much previously.  Recommendations:  Let's go ahead and try  Questran 2 g orally once daily. Medication not be taken within 2 hours of other medications This medication is primarily for improving triglycerides and cholesterol. Off label use reviewed. Trial of Librax 1 tablet before meals and at bedtime as needed for abdominal cramps and episodes of diarrhea Patient is to call me in 3 weeks and let me know how you doing with these new medications Office visit with me in 6 weeks He may need one more APC treatment of his rectum at some point.

## 2012-06-18 NOTE — Patient Instructions (Addendum)
Try Questran 2 g orally once daily. Medication not be taken within 2 hours of other medications This medication is primarily for improving triglycerides and cholesterol. In your case, I am utilizing it to help with your bowel symptoms.  Trial of Librax 1 tablet before meals and at bedtime as needed for abdominal cramps and episodes of diarrhea  Call me in 3 weeks and let me know how you doing with these new medications  Office visit with me in 6 weeks  As discussed, you may need one more treatment of your radiation induced rectal inflammation later in the year

## 2012-07-09 ENCOUNTER — Ambulatory Visit: Payer: Medicare Other | Admitting: Cardiology

## 2012-07-20 ENCOUNTER — Telehealth: Payer: Self-pay

## 2012-07-20 NOTE — Telephone Encounter (Signed)
Pt called with 3 week progress report. He said he was doing somewhat better on the new medications. He stated he was going to continue to take them for now and will call back with any other problems.

## 2012-07-20 NOTE — Telephone Encounter (Signed)
Noted  

## 2012-07-21 ENCOUNTER — Ambulatory Visit (INDEPENDENT_AMBULATORY_CARE_PROVIDER_SITE_OTHER): Payer: Medicare Other | Admitting: Physician Assistant

## 2012-07-21 ENCOUNTER — Encounter: Payer: Self-pay | Admitting: Physician Assistant

## 2012-07-21 VITALS — BP 145/75 | HR 93 | Ht 66.0 in | Wt 188.4 lb

## 2012-07-21 DIAGNOSIS — E785 Hyperlipidemia, unspecified: Secondary | ICD-10-CM

## 2012-07-21 DIAGNOSIS — I359 Nonrheumatic aortic valve disorder, unspecified: Secondary | ICD-10-CM

## 2012-07-21 DIAGNOSIS — I447 Left bundle-branch block, unspecified: Secondary | ICD-10-CM

## 2012-07-21 DIAGNOSIS — E669 Obesity, unspecified: Secondary | ICD-10-CM

## 2012-07-21 DIAGNOSIS — I350 Nonrheumatic aortic (valve) stenosis: Secondary | ICD-10-CM

## 2012-07-21 DIAGNOSIS — I10 Essential (primary) hypertension: Secondary | ICD-10-CM

## 2012-07-21 NOTE — Assessment & Plan Note (Signed)
Recent labs brought with him today cholesterol is 124 HDL early one triglycerides 207 glucose 132

## 2012-07-21 NOTE — Assessment & Plan Note (Signed)
unchanged

## 2012-07-21 NOTE — Progress Notes (Signed)
HPI:  This is a 72 year old gentleman who is here for followup of hypertension, hyperlipidemia, and aortic valve disease. He has mild aortic stenosis on 2-D echo on 06/03/2010  He is here for his yearly followup. He denies any chest pain, palpitations, dyspnea, dyspnea on exertion,  or presyncope. He usually walks regularly but has had a hard time keeping up with the weather this winter. He he hopes to start a regular exercise program the spring.  He does have dizziness with quick movement of his neck or head for which he takes meclizine.   Allergies: -- Codeine -- Nausea And Vomiting  Current Outpatient Prescriptions on File Prior to Visit: acyclovir (ZOVIRAX) 800 MG tablet, Take 800 mg by mouth daily.  , Disp: , Rfl:  ALPRAZolam (XANAX) 0.5 MG tablet, Take 0.5 mg by mouth at bedtime as needed. For sleep, Disp: , Rfl:  aspirin 325 MG tablet, Take 325 mg by mouth daily.  , Disp: , Rfl:  cholecalciferol (VITAMIN D) 1000 UNITS tablet, Take 1,000 Units by mouth daily.  , Disp: , Rfl:  fexofenadine (ALLEGRA) 180 MG tablet, Take 180 mg by mouth daily as needed. For allergies, Disp: , Rfl:  glycopyrrolate (ROBINUL) 2 MG tablet, Take 2 mg by mouth daily., Disp: , Rfl:  hyoscyamine (LEVSIN SL) 0.125 MG SL tablet, Place 1 tablet (0.125 mg total) under the tongue every 4 (four) hours as needed for cramping., Disp: 60 tablet, Rfl: 1 meclizine (ANTIVERT) 25 MG tablet, Take 25 mg by mouth at bedtime. , Disp: , Rfl:  Melatonin 3 MG TABS, Take 1 tablet by mouth at bedtime as needed. For sleep, Disp: , Rfl:  Omega-3 Fatty Acids (OMEGA-3 FISH OIL) 1200 MG CAPS, Take 1 capsule by mouth daily.  , Disp: , Rfl:  simvastatin (ZOCOR) 20 MG tablet, Take 20 mg by mouth every evening., Disp: , Rfl:  terazosin (HYTRIN) 5 MG capsule, Take 5 mg by mouth at bedtime.  , Disp: , Rfl:  vitamin C (ASCORBIC ACID) 500 MG tablet, Take 500 mg by mouth daily.  , Disp: , Rfl:   No current facility-administered medications on file  prior to visit.   Past Medical History:   Aortic stenosis, mild                           2000           Comment:And insufficiency, evaluation by Dr. Daleen Squibb in               2000   Hypertension                                                   Comment:Mild internal carotid artery plaque on MRI/MRA               in 2002   Hyperlipidemia                                                 Comment:Elevated triglycerides   Left bundle branch block                        2012  Comment:2012   Tobacco abuse, in remission                                    Comment:60 pack years; quit in 1999   Insomnia                                                     Obesity                                                      Nephrolithiasis                                 08/2009         Comment:2011   Chronic kidney disease                                         Comment:Borderline; creatinine of 1.6 in 08/2009, but               1.15 in 05/2010   Benign prostatic hypertrophy                                   Comment:PSA of 1.39 in 09/2010   Chronic sinusitis                                              Comment:By MRI   Metabolic syndrome                                             Comment:Fasting hyperglycemia   Prostate cancer                                 11/2010         Comment:s/p XRT   Murmur                                                       Stroke                                                         Comment:TIA   IBS (irritable bowel syndrome)  Arthritis                                                    Diverticulosis                                  2013         Proctitis                                                      Comment:radiation induced   Hiatal hernia                                   2006         Hemorrhoids                                     2006        Past Surgical History:   CHOLECYSTECTOMY                                                TONSILLECTOMY                                                 COLONOSCOPY                                      2006           Comment:Dr. Edwards-normal mucosa throughout colon,               moderate internal hemorrhoids. bx= benign               colonic mucosa   ANKLE FRACTURE SURGERY                                          Comment:left   COLONOSCOPY                                      12/03/2011      Comment:Dr. Rourk- colonic diverticulosis,               radiation-induced proctitis- s/p APC ablation,               no microscopic colitis on bx   ESOPHAGOGASTRODUODENOSCOPY                       2006  Comment:Dr. Randa Evens- small hiatal hernia and slightly               reddened distal esophagus o/w normal  Review of patient's family history indicates:   Heart failure                  Mother                   Heart attack                   Father                   Heart failure                  Brother                    Comment: bladder cancer (agent orange exposure)   Cirrhosis                      Brother                    Comment: chronic viral hepatitis   Colon cancer                   Neg Hx                   Social History   Marital Status: Married             Spouse Name:                      Years of Education:                 Number of children: 2           Occupational History Occupation          Associate Professor            Comment              Retired             Nurse, adult [employer]     Social History Main Topics   Smoking Status: Former Smoker                   Packs/Day: 1.50  Years: 40        Quit date: 11/04/1997   Smokeless Status: Never Used                       Comment: Quit in 1999   Alcohol Use: No             Drug Use: No             Sexual Activity: Not on file        Other Topics            Concern   None on file  Social History Narrative   Married with 2 children   Lives locally   No regular exercise    OZH:YQMV of hearing, see  history of present illness otherwise negative   PHYSICAL EXAM: Well-nournished, in no acute distress. Neck: No JVD, HJR, Bruit, or thyroid enlargement  Lungs: No tachypnea, clear without wheezing, rales, or rhonchi  Cardiovascular: RRR, PMI not displaced, 2/6 systolic murmur at the left sternal border and 2/6 systolic murmur at the apex, no  bruit, thrill, or heave.  Abdomen: BS normal. Soft without organomegaly, masses, lesions or tenderness.  Extremities: without cyanosis, clubbing or edema. Good distal pulses bilateral  SKin: Warm, no lesions or rashes   Musculoskeletal: No deformities  Neuro: no focal signs  BP 145/75  Pulse 93  Ht 5\' 6"  (1.676 m)  Wt 188 lb 6.4 oz (85.458 kg)  BMI 30.42 kg/m2    ZOX:WRUEAV sinus rhythm with left bundle branch block

## 2012-07-21 NOTE — Patient Instructions (Addendum)
Your physician recommends that you schedule a follow-up appointment in: ONE YEAR WITH RR  Your physician has requested that you have an echocardiogram. Echocardiography is a painless test that uses sound waves to create images of your heart. It provides your doctor with information about the size and shape of your heart and how well your heart's chambers and valves are working. This procedure takes approximately one hour. There are no restrictions for this procedure.  WE WILL CALL YOU WITH THE TEST RESULTS

## 2012-07-21 NOTE — Assessment & Plan Note (Signed)
Weight loss recommended 

## 2012-07-21 NOTE — Assessment & Plan Note (Signed)
Is been 2 years since his last 2-D echo at which time he had mild aortic stenosis. We will recheck a 2-D echo

## 2012-07-28 ENCOUNTER — Ambulatory Visit (HOSPITAL_COMMUNITY)
Admission: RE | Admit: 2012-07-28 | Discharge: 2012-07-28 | Disposition: A | Discharge status: Home / Self Care | Payer: Medicare Other | Source: Ambulatory Visit | Attending: Physician Assistant | Admitting: Physician Assistant

## 2012-07-28 DIAGNOSIS — I359 Nonrheumatic aortic valve disorder, unspecified: Secondary | ICD-10-CM

## 2012-07-28 DIAGNOSIS — E785 Hyperlipidemia, unspecified: Secondary | ICD-10-CM | POA: Insufficient documentation

## 2012-07-28 DIAGNOSIS — I350 Nonrheumatic aortic (valve) stenosis: Secondary | ICD-10-CM

## 2012-07-28 DIAGNOSIS — E669 Obesity, unspecified: Secondary | ICD-10-CM

## 2012-07-28 DIAGNOSIS — I447 Left bundle-branch block, unspecified: Secondary | ICD-10-CM | POA: Insufficient documentation

## 2012-07-28 DIAGNOSIS — I10 Essential (primary) hypertension: Secondary | ICD-10-CM | POA: Insufficient documentation

## 2012-07-28 NOTE — Progress Notes (Signed)
*  PRELIMINARY RESULTS* Echocardiogram 2D Echocardiogram has been performed.  Conrad [place] 07/28/2012, 2:33 PM

## 2012-07-30 ENCOUNTER — Ambulatory Visit: Payer: Medicare Other | Admitting: Internal Medicine

## 2012-08-26 ENCOUNTER — Encounter: Payer: Self-pay | Admitting: Internal Medicine

## 2012-08-31 ENCOUNTER — Ambulatory Visit (INDEPENDENT_AMBULATORY_CARE_PROVIDER_SITE_OTHER): Payer: Medicare Other | Admitting: Internal Medicine

## 2012-08-31 ENCOUNTER — Encounter: Payer: Self-pay | Admitting: Internal Medicine

## 2012-08-31 VITALS — BP 136/75 | HR 89 | Temp 98.2°F | Ht 66.0 in | Wt 186.6 lb

## 2012-08-31 DIAGNOSIS — K627 Radiation proctitis: Secondary | ICD-10-CM

## 2012-08-31 DIAGNOSIS — K589 Irritable bowel syndrome without diarrhea: Secondary | ICD-10-CM

## 2012-08-31 DIAGNOSIS — K6289 Other specified diseases of anus and rectum: Secondary | ICD-10-CM

## 2012-08-31 NOTE — Patient Instructions (Addendum)
Continue Metamucil daily  Finish out current prescription of Questran and then stop  Continue to use librax as needed  Office visit here in 6 months

## 2012-08-31 NOTE — Progress Notes (Signed)
Primary Care Physician:  Milana Obey, MD Primary Gastroenterologist:  Dr. Jena Gauss  Pre-Procedure History & Physical: HPI:  Calvin Moreno is a 72 y.o. male here for  symptomatic radiation-induced proctitis/IBS. Overall, the general trend has been towards decrease episodes of rectal bleeding and diarrhea. Started taking Metamucil on his own and this seemed to normalize his bowel function even more. He feels Questran from time to time, even at low dose, "binds him up". Librax before meals but tends to ameliorate episodes of postprandial abdominal cramps and diarrhea.  Past Medical History  Diagnosis Date  . Aortic stenosis, mild 2000    And insufficiency, evaluation by Dr. Daleen Squibb in 2000  . Hypertension     Mild internal carotid artery plaque on MRI/MRA in 2002  . Hyperlipidemia     Elevated triglycerides  . Left bundle branch block 2012    2012  . Tobacco abuse, in remission     60 pack years; quit in 1999  . Insomnia   . Obesity   . Nephrolithiasis 08/2009    2011  . Chronic kidney disease     Borderline; creatinine of 1.6 in 08/2009, but 1.15 in 05/2010  . Benign prostatic hypertrophy     PSA of 1.39 in 09/2010  . Chronic sinusitis     By MRI  . Metabolic syndrome     Fasting hyperglycemia  . Prostate cancer 11/2010    s/p XRT  . Murmur   . Stroke     TIA  . IBS (irritable bowel syndrome)   . Arthritis   . Diverticulosis 2013  . Proctitis     radiation induced  . Hiatal hernia 2006  . Hemorrhoids 2006    Past Surgical History  Procedure Laterality Date  . Cholecystectomy    . Tonsillectomy    . Colonoscopy  2006    Dr. Henry Russel mucosa throughout colon, moderate internal hemorrhoids. bx= benign colonic mucosa  . Ankle fracture surgery      left  . Colonoscopy  12/03/2011    Dr. Jena Gauss- colonic diverticulosis, radiation-induced proctitis- s/p APC ablation, no microscopic colitis on bx  . Esophagogastroduodenoscopy  2006    Dr. Randa Evens- small hiatal hernia and  slightly reddened distal esophagus o/w normal  . Ileocolonoscopy  12/03/2011    [id number] diverticulosis. Radiation-induced proctitis status post APC ablation. Status post segmental colon Biopsy    Prior to Admission medications   Medication Sig Start Date End Date Taking? Authorizing Provider  acyclovir (ZOVIRAX) 800 MG tablet Take 800 mg by mouth 3 (three) times daily.    Yes Historical Provider, MD  ALPRAZolam Prudy Feeler) 0.5 MG tablet Take 0.5 mg by mouth at bedtime as needed. For sleep   Yes Historical Provider, MD  aspirin 325 MG tablet Take 325 mg by mouth daily.     Yes Historical Provider, MD  cholecalciferol (VITAMIN D) 1000 UNITS tablet Take 1,000 Units by mouth daily.     Yes Historical Provider, MD  clidinium-chlordiazePOXIDE (LIBRAX) 2.5-5 MG per capsule Take 1 capsule by mouth 4 (four) times daily -  before meals and at bedtime.   Yes Historical Provider, MD  fexofenadine (ALLEGRA) 180 MG tablet Take 180 mg by mouth daily as needed. For allergies   Yes Historical Provider, MD  glycopyrrolate (ROBINUL) 2 MG tablet Take 2 mg by mouth daily.   Yes Historical Provider, MD  hyoscyamine (LEVSIN SL) 0.125 MG SL tablet Place 1 tablet (0.125 mg total) under the tongue every 4 (four) hours as  needed for cramping. 03/10/12  Yes Nira Retort, NP  meclizine (ANTIVERT) 25 MG tablet Take 25 mg by mouth at bedtime.    Yes Historical Provider, MD  Melatonin 3 MG TABS Take 1 tablet by mouth at bedtime as needed. For sleep   Yes Historical Provider, MD  Omega-3 Fatty Acids (OMEGA-3 FISH OIL) 1200 MG CAPS Take 1 capsule by mouth daily.     Yes Historical Provider, MD  simvastatin (ZOCOR) 20 MG tablet Take 20 mg by mouth every evening.   Yes Historical Provider, MD  terazosin (HYTRIN) 5 MG capsule Take 5 mg by mouth at bedtime.     Yes Historical Provider, MD  vitamin C (ASCORBIC ACID) 500 MG tablet Take 500 mg by mouth daily.     Yes Historical Provider, MD    Allergies as of 08/31/2012 - Review  Complete 08/31/2012  Allergen Reaction Noted  . Codeine Nausea And Vomiting 11/06/2011    Family History  Problem Relation Age of Onset  . Heart failure Mother   . Heart attack Father   . Heart failure Brother     bladder cancer (agent orange exposure)  . Cirrhosis Brother     chronic viral hepatitis  . Colon cancer Neg Hx     History   Social History  . Marital Status: Married    Spouse Name: N/A    Number of Children: 2  . Years of Education: N/A   Occupational History  . Retired [employer]   Social History Main Topics  . Smoking status: Former Smoker -- 1.50 packs/day for 40 years    Quit date: 11/04/1997  . Smokeless tobacco: Never Used     Comment: Quit in 1999  . Alcohol Use: No  . Drug Use: No  . Sexually Active: Not on file   Other Topics Concern  . Not on file   Social History Narrative   Married with 2 children   Lives locally   No regular exercise    Review of Systems: See HPI, otherwise negative ROS  Physical Exam: BP 136/75  Pulse 89  Temp(Src) 98.2 F (36.8 C) (Oral)  Ht 5\' 6"  (1.676 m)  Wt 186 lb 9.6 oz (84.641 kg)  BMI 30.13 kg/m2 General:   Alert,  Well-developed, well-nourished, pleasant and cooperative in NAD Skin:  Intact without significant lesions or rashes. Eyes:  Sclera clear, no icterus.   Conjunctiva pink. Ears:  Normal auditory acuity. Nose:  No deformity, discharge,  or lesions. Mouth:  No deformity or lesions. Neck:  Supple; no masses or thyromegaly. No significant cervical adenopathy. Lungs:  Clear throughout to auscultation.   No wheezes, crackles, or rhonchi. No acute distress. Heart:  Regular rate and rhythm; no murmurs, clicks, rubs,  or gallops. Abdomen: Non-distended, normal bowel sounds.  Soft and nontender without appreciable mass or hepatosplenomegaly.  Pulses:  Normal pulses noted. Extremities:  Without clubbing or edema.  Impression/Plan: 72 year old fellow of radiation-induced proctitis now with  gradually improving symptoms after APC treatment of the inflamed rectal mucosa. 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  He may well do as good with Metamucil in the past with the addition of Questran.  Recommendations:   Take Metamucil every day, even on days you feel  is not needed. Finish out prescription of Questran and stop it. Utilize Librax before meals and at bedtime as needed for abdominal cramps and diarrhea. Would consider taking one of these capsules particularly before going out  to eat.  Office visit here in 6 months.

## 2013-02-18 ENCOUNTER — Encounter: Payer: Self-pay | Admitting: Internal Medicine

## 2013-06-28 ENCOUNTER — Other Ambulatory Visit (HOSPITAL_COMMUNITY): Payer: Self-pay | Admitting: Family Medicine

## 2013-06-28 ENCOUNTER — Ambulatory Visit (HOSPITAL_COMMUNITY)
Admission: RE | Admit: 2013-06-28 | Discharge: 2013-06-28 | Disposition: A | Discharge status: Home / Self Care | Payer: Medicare Other | Source: Ambulatory Visit | Attending: Family Medicine | Admitting: Family Medicine

## 2013-06-28 DIAGNOSIS — R61 Generalized hyperhidrosis: Secondary | ICD-10-CM

## 2013-07-01 ENCOUNTER — Ambulatory Visit (INDEPENDENT_AMBULATORY_CARE_PROVIDER_SITE_OTHER): Payer: Medicare Other | Admitting: Internal Medicine

## 2013-07-01 ENCOUNTER — Encounter: Payer: Self-pay | Admitting: Internal Medicine

## 2013-07-01 VITALS — BP 142/79 | HR 111 | Temp 98.3°F | Wt 182.0 lb

## 2013-07-01 DIAGNOSIS — K6289 Other specified diseases of anus and rectum: Secondary | ICD-10-CM

## 2013-07-01 DIAGNOSIS — K589 Irritable bowel syndrome without diarrhea: Secondary | ICD-10-CM

## 2013-07-01 DIAGNOSIS — K627 Radiation proctitis: Secondary | ICD-10-CM

## 2013-07-01 NOTE — Patient Instructions (Signed)
Schedule FS with sedation for APC ablate radiation proctitis

## 2013-07-01 NOTE — Progress Notes (Signed)
Primary Care Physician:  Cadance Raus Bellow, MD Primary Gastroenterologist:  Dr. Gala Romney  Pre-Procedure History & Physical: HPI:  Calvin Moreno is a 72 y.o. male here for  followup of IBS and radiation proctitis. Has to 3 episodes of rectal bleeding weekly. Has intermittent diarrhea. Had one APC session back in 2013. Has intermittent postprandial abdominal cramps and diarrhea. No microscopic colitis on prior biopsies.   Past Medical History  Diagnosis Date  . Aortic stenosis, mild 2000    And insufficiency, evaluation by Dr. Verl Blalock in 2000  . Hypertension     Mild internal carotid artery plaque on MRI/MRA in 2002  . Hyperlipidemia     Elevated triglycerides  . Left bundle branch block 2012    2012  . Tobacco abuse, in remission     60 pack years; quit in 1999  . Insomnia   . Obesity   . Nephrolithiasis 08/2009    2011  . Chronic kidney disease     Borderline; creatinine of 1.6 in 08/2009, but 1.15 in 05/2010  . Benign prostatic hypertrophy     PSA of 1.39 in 09/2010  . Chronic sinusitis     By MRI  . Metabolic syndrome     Fasting hyperglycemia  . Prostate cancer 11/2010    s/p XRT  . Murmur   . Stroke     TIA  . IBS (irritable bowel syndrome)   . Arthritis   . Diverticulosis 2013  . Proctitis     radiation induced  . Hiatal hernia 2006  . Hemorrhoids 2006    Past Surgical History  Procedure Laterality Date  . Cholecystectomy    . Tonsillectomy    . Colonoscopy  2006    Dr. Ward Givens mucosa throughout colon, moderate internal hemorrhoids. bx= benign colonic mucosa  . Ankle fracture surgery      left  . Colonoscopy  12/03/2011    Dr. Gala Romney- colonic diverticulosis, radiation-induced proctitis- s/p APC ablation, no microscopic colitis on bx  . Esophagogastroduodenoscopy  2006    Dr. Oletta Lamas- small hiatal hernia and slightly reddened distal esophagus o/w normal  . Ileocolonoscopy  12/03/2011    [id number] diverticulosis. Radiation-induced proctitis status post APC  ablation. Status post segmental colon Biopsy    Prior to Admission medications   Medication Sig Start Date End Date Taking? Authorizing Provider  acyclovir (ZOVIRAX) 800 MG tablet Take 800 mg by mouth 3 (three) times daily.    Yes Historical Provider, MD  ALPRAZolam Duanne Moron) 0.5 MG tablet Take 0.5 mg by mouth at bedtime as needed. For sleep   Yes Historical Provider, MD  aspirin 325 MG tablet Take 325 mg by mouth daily.     Yes Historical Provider, MD  cholecalciferol (VITAMIN D) 1000 UNITS tablet Take 1,000 Units by mouth daily.     Yes Historical Provider, MD  glycopyrrolate (ROBINUL) 2 MG tablet Take 2 mg by mouth daily.   Yes Historical Provider, MD  meclizine (ANTIVERT) 25 MG tablet Take 25 mg by mouth at bedtime.    Yes Historical Provider, MD  Melatonin 3 MG TABS Take 1 tablet by mouth at bedtime as needed. For sleep   Yes Historical Provider, MD  Omega-3 Fatty Acids (OMEGA-3 FISH OIL) 1200 MG CAPS Take 1 capsule by mouth daily.     Yes Historical Provider, MD  simvastatin (ZOCOR) 20 MG tablet Take 20 mg by mouth every evening.   Yes Historical Provider, MD  terazosin (HYTRIN) 5 MG capsule Take 5 mg by mouth  at bedtime.     Yes Historical Provider, MD  vitamin C (ASCORBIC ACID) 500 MG tablet Take 500 mg by mouth daily.     Yes Historical Provider, MD  clidinium-chlordiazePOXIDE (LIBRAX) 2.5-5 MG per capsule Take 1 capsule by mouth 4 (four) times daily -  before meals and at bedtime.    Historical Provider, MD  fexofenadine (ALLEGRA) 180 MG tablet Take 180 mg by mouth daily as needed. For allergies    Historical Provider, MD  hyoscyamine (LEVSIN SL) 0.125 MG SL tablet Place 1 tablet (0.125 mg total) under the tongue every 4 (four) hours as needed for cramping. 03/10/12   Orvil Feil, NP    Allergies as of 07/01/2013 - Review Complete 07/01/2013  Allergen Reaction Noted  . Codeine Nausea And Vomiting 11/06/2011    Family History  Problem Relation Age of Onset  . Heart failure Mother    . Heart attack Father   . Heart failure Brother     bladder cancer (agent orange exposure)  . Cirrhosis Brother     chronic viral hepatitis  . Colon cancer Neg Hx     History   Social History  . Marital Status: Married    Spouse Name: N/A    Number of Children: 2  . Years of Education: N/A   Occupational History  . Retired [employer]   Social History Main Topics  . Smoking status: Former Smoker -- 1.50 packs/day for 40 years    Quit date: 11/04/1997  . Smokeless tobacco: Never Used     Comment: Quit in 1999  . Alcohol Use: No  . Drug Use: No  . Sexual Activity: Not on file   Other Topics Concern  . Not on file   Social History Narrative   Married with 2 children   Lives locally   No regular exercise    Review of Systems: See HPI, otherwise negative ROS  Physical Exam: BP 142/79  Pulse 111  Temp(Src) 98.3 F (36.8 C) (Oral)  Wt 182 lb (82.555 kg) General:   Alert,  Well-developed, well-nourished, pleasant and cooperative in NAD Skin:  Intact without significant lesions or rashes. Eyes:  Sclera clear, no icterus.   Conjunctiva pink. Ears:  Normal auditory acuity. Nose:  No deformity, discharge,  or lesions. Mouth:  No deformity or lesions. Neck:  Supple; no masses or thyromegaly. No significant cervical adenopathy. Lungs:  Clear throughout to auscultation.   No wheezes, crackles, or rhonchi. No acute distress. Heart:  Regular rate and rhythm; no murmurs, clicks, rubs,  or gallops. Abdomen: Non-distended, normal bowel sounds.  Soft and nontender without appreciable mass or hepatosplenomegaly.  Pulses:  Normal pulses noted. Extremities:  Without clubbing or edema. Rectal-  deferred until the time of sigmoidoscopy  Impression/Plan: 73 year old gentleman with a radiation proctitis intermittent paper hematochezia. This is an aggravation for the patient. I feel he would benefit from additional APC treatment. Although even another treatment may not  completely resolve his rectal bleeding. Plan for sigmoidoscopy with IV conscious sedation and APC ablation of abnormal rectal blood vessels in the near future. Risks, benefits, limitations and imponderables reviewed. He is agreeable. I will likely add back low-dose Questran to his regimen and hold on the Metamucil as he complained of significant diarrhea after APC treatment previously. Further recommendations to follow.

## 2013-07-04 ENCOUNTER — Other Ambulatory Visit: Payer: Self-pay | Admitting: Internal Medicine

## 2013-07-04 ENCOUNTER — Encounter (HOSPITAL_COMMUNITY): Payer: Self-pay | Admitting: Pharmacy Technician

## 2013-07-04 DIAGNOSIS — K627 Radiation proctitis: Secondary | ICD-10-CM

## 2013-07-13 ENCOUNTER — Ambulatory Visit: Admit: 2013-07-13 | Payer: Self-pay | Admitting: Internal Medicine

## 2013-07-13 ENCOUNTER — Encounter (HOSPITAL_COMMUNITY): Payer: Self-pay | Admitting: *Deleted

## 2013-07-13 ENCOUNTER — Encounter (HOSPITAL_COMMUNITY)
Admission: RE | Disposition: A | Discharge status: Home / Self Care | Payer: Self-pay | Source: Ambulatory Visit | Attending: Internal Medicine

## 2013-07-13 ENCOUNTER — Ambulatory Visit (HOSPITAL_COMMUNITY)
Admission: RE | Admit: 2013-07-13 | Discharge: 2013-07-13 | Disposition: A | Discharge status: Home / Self Care | Payer: Medicare Other | Source: Ambulatory Visit | Attending: Internal Medicine | Admitting: Internal Medicine

## 2013-07-13 DIAGNOSIS — Z8546 Personal history of malignant neoplasm of prostate: Secondary | ICD-10-CM | POA: Insufficient documentation

## 2013-07-13 DIAGNOSIS — Z87891 Personal history of nicotine dependence: Secondary | ICD-10-CM | POA: Insufficient documentation

## 2013-07-13 DIAGNOSIS — K625 Hemorrhage of anus and rectum: Secondary | ICD-10-CM | POA: Insufficient documentation

## 2013-07-13 DIAGNOSIS — K6289 Other specified diseases of anus and rectum: Secondary | ICD-10-CM | POA: Insufficient documentation

## 2013-07-13 DIAGNOSIS — K5120 Ulcerative (chronic) proctitis without complications: Secondary | ICD-10-CM

## 2013-07-13 DIAGNOSIS — K627 Radiation proctitis: Secondary | ICD-10-CM

## 2013-07-13 DIAGNOSIS — N189 Chronic kidney disease, unspecified: Secondary | ICD-10-CM | POA: Insufficient documentation

## 2013-07-13 DIAGNOSIS — I129 Hypertensive chronic kidney disease with stage 1 through stage 4 chronic kidney disease, or unspecified chronic kidney disease: Secondary | ICD-10-CM | POA: Insufficient documentation

## 2013-07-13 DIAGNOSIS — Y842 Radiological procedure and radiotherapy as the cause of abnormal reaction of the patient, or of later complication, without mention of misadventure at the time of the procedure: Secondary | ICD-10-CM | POA: Insufficient documentation

## 2013-07-13 HISTORY — PX: HOT HEMOSTASIS: SHX5433

## 2013-07-13 HISTORY — PX: FLEXIBLE SIGMOIDOSCOPY: SHX5431

## 2013-07-13 SURGERY — SIGMOIDOSCOPY, FLEXIBLE
Anesthesia: Moderate Sedation

## 2013-07-13 MED ORDER — MEPERIDINE HCL 100 MG/ML IJ SOLN
INTRAMUSCULAR | Status: AC
  Filled 2013-07-13: qty 2

## 2013-07-13 MED ORDER — STERILE WATER FOR IRRIGATION IR SOLN
Status: DC | PRN
  Administered 2013-07-13: 12:00:00

## 2013-07-13 MED ORDER — SODIUM CHLORIDE 0.9 % IV SOLN: INTRAVENOUS | Status: DC

## 2013-07-13 MED ORDER — ONDANSETRON HCL 4 MG/2ML IJ SOLN
INTRAMUSCULAR | Status: AC
  Filled 2013-07-13: qty 2

## 2013-07-13 MED ORDER — MIDAZOLAM HCL 5 MG/5ML IJ SOLN
INTRAMUSCULAR | Status: AC
  Filled 2013-07-13: qty 10

## 2013-07-13 MED ORDER — ONDANSETRON HCL 4 MG/2ML IJ SOLN
INTRAMUSCULAR | Status: DC | PRN
  Administered 2013-07-13: 4 mg via INTRAVENOUS

## 2013-07-13 MED ORDER — MIDAZOLAM HCL 5 MG/5ML IJ SOLN
INTRAMUSCULAR | Status: DC | PRN
  Administered 2013-07-13: 1 mg via INTRAVENOUS
  Administered 2013-07-13: 2 mg via INTRAVENOUS
  Administered 2013-07-13 (×2): 1 mg via INTRAVENOUS
  Administered 2013-07-13: 2 mg via INTRAVENOUS

## 2013-07-13 MED ORDER — MEPERIDINE HCL 100 MG/ML IJ SOLN
INTRAMUSCULAR | Status: DC | PRN
  Administered 2013-07-13: 50 mg via INTRAVENOUS
  Administered 2013-07-13 (×3): 25 mg via INTRAVENOUS

## 2013-07-13 NOTE — H&P (View-Only) (Signed)
Primary Care Physician:  Glynis Hunsucker Bellow, MD Primary Gastroenterologist:  Dr. Gala Romney  Pre-Procedure History & Physical: HPI:  Calvin Moreno is a 73 y.o. male here for  followup of IBS and radiation proctitis. Has to 3 episodes of rectal bleeding weekly. Has intermittent diarrhea. Had one APC session back in 2013. Has intermittent postprandial abdominal cramps and diarrhea. No microscopic colitis on prior biopsies.   Past Medical History  Diagnosis Date  . Aortic stenosis, mild 2000    And insufficiency, evaluation by Dr. Verl Blalock in 2000  . Hypertension     Mild internal carotid artery plaque on MRI/MRA in 2002  . Hyperlipidemia     Elevated triglycerides  . Left bundle branch block 2012    2012  . Tobacco abuse, in remission     60 pack years; quit in 1999  . Insomnia   . Obesity   . Nephrolithiasis 08/2009    2011  . Chronic kidney disease     Borderline; creatinine of 1.6 in 08/2009, but 1.15 in 05/2010  . Benign prostatic hypertrophy     PSA of 1.39 in 09/2010  . Chronic sinusitis     By MRI  . Metabolic syndrome     Fasting hyperglycemia  . Prostate cancer 11/2010    s/p XRT  . Murmur   . Stroke     TIA  . IBS (irritable bowel syndrome)   . Arthritis   . Diverticulosis 2013  . Proctitis     radiation induced  . Hiatal hernia 2006  . Hemorrhoids 2006    Past Surgical History  Procedure Laterality Date  . Cholecystectomy    . Tonsillectomy    . Colonoscopy  2006    Dr. Ward Givens mucosa throughout colon, moderate internal hemorrhoids. bx= benign colonic mucosa  . Ankle fracture surgery      left  . Colonoscopy  12/03/2011    Dr. Gala Romney- colonic diverticulosis, radiation-induced proctitis- s/p APC ablation, no microscopic colitis on bx  . Esophagogastroduodenoscopy  2006    Dr. Oletta Lamas- small hiatal hernia and slightly reddened distal esophagus o/w normal  . Ileocolonoscopy  12/03/2011    [id number] diverticulosis. Radiation-induced proctitis status post APC  ablation. Status post segmental colon Biopsy    Prior to Admission medications   Medication Sig Start Date End Date Taking? Authorizing Provider  acyclovir (ZOVIRAX) 800 MG tablet Take 800 mg by mouth 3 (three) times daily.    Yes Historical Provider, MD  ALPRAZolam Duanne Moron) 0.5 MG tablet Take 0.5 mg by mouth at bedtime as needed. For sleep   Yes Historical Provider, MD  aspirin 325 MG tablet Take 325 mg by mouth daily.     Yes Historical Provider, MD  cholecalciferol (VITAMIN D) 1000 UNITS tablet Take 1,000 Units by mouth daily.     Yes Historical Provider, MD  glycopyrrolate (ROBINUL) 2 MG tablet Take 2 mg by mouth daily.   Yes Historical Provider, MD  meclizine (ANTIVERT) 25 MG tablet Take 25 mg by mouth at bedtime.    Yes Historical Provider, MD  Melatonin 3 MG TABS Take 1 tablet by mouth at bedtime as needed. For sleep   Yes Historical Provider, MD  Omega-3 Fatty Acids (OMEGA-3 FISH OIL) 1200 MG CAPS Take 1 capsule by mouth daily.     Yes Historical Provider, MD  simvastatin (ZOCOR) 20 MG tablet Take 20 mg by mouth every evening.   Yes Historical Provider, MD  terazosin (HYTRIN) 5 MG capsule Take 5 mg by mouth  at bedtime.     Yes Historical Provider, MD  vitamin C (ASCORBIC ACID) 500 MG tablet Take 500 mg by mouth daily.     Yes Historical Provider, MD  clidinium-chlordiazePOXIDE (LIBRAX) 2.5-5 MG per capsule Take 1 capsule by mouth 4 (four) times daily -  before meals and at bedtime.    Historical Provider, MD  fexofenadine (ALLEGRA) 180 MG tablet Take 180 mg by mouth daily as needed. For allergies    Historical Provider, MD  hyoscyamine (LEVSIN SL) 0.125 MG SL tablet Place 1 tablet (0.125 mg total) under the tongue every 4 (four) hours as needed for cramping. 03/10/12   Anna W Sams, NP    Allergies as of 07/01/2013 - Review Complete 07/01/2013  Allergen Reaction Noted  . Codeine Nausea And Vomiting 11/06/2011    Family History  Problem Relation Age of Onset  . Heart failure Mother    . Heart attack Father   . Heart failure Brother     bladder cancer (agent orange exposure)  . Cirrhosis Brother     chronic viral hepatitis  . Colon cancer Neg Hx     History   Social History  . Marital Status: Married    Spouse Name: N/A    Number of Children: 2  . Years of Education: N/A   Occupational History  . Retired [employer]   Social History Main Topics  . Smoking status: Former Smoker -- 1.50 packs/day for 40 years    Quit date: 11/04/1997  . Smokeless tobacco: Never Used     Comment: Quit in 1999  . Alcohol Use: No  . Drug Use: No  . Sexual Activity: Not on file   Other Topics Concern  . Not on file   Social History Narrative   Married with 2 children   Lives locally   No regular exercise    Review of Systems: See HPI, otherwise negative ROS  Physical Exam: BP 142/79  Pulse 111  Temp(Src) 98.3 F (36.8 C) (Oral)  Wt 182 lb (82.555 kg) General:   Alert,  Well-developed, well-nourished, pleasant and cooperative in NAD Skin:  Intact without significant lesions or rashes. Eyes:  Sclera clear, no icterus.   Conjunctiva pink. Ears:  Normal auditory acuity. Nose:  No deformity, discharge,  or lesions. Mouth:  No deformity or lesions. Neck:  Supple; no masses or thyromegaly. No significant cervical adenopathy. Lungs:  Clear throughout to auscultation.   No wheezes, crackles, or rhonchi. No acute distress. Heart:  Regular rate and rhythm; no murmurs, clicks, rubs,  or gallops. Abdomen: Non-distended, normal bowel sounds.  Soft and nontender without appreciable mass or hepatosplenomegaly.  Pulses:  Normal pulses noted. Extremities:  Without clubbing or edema. Rectal-  deferred until the time of sigmoidoscopy  Impression/Plan: 73-year-old gentleman with a radiation proctitis intermittent paper hematochezia. This is an aggravation for the patient. I feel he would benefit from additional APC treatment. Although even another treatment may not  completely resolve his rectal bleeding. Plan for sigmoidoscopy with IV conscious sedation and APC ablation of abnormal rectal blood vessels in the near future. Risks, benefits, limitations and imponderables reviewed. He is agreeable. I will likely add back low-dose Questran to his regimen and hold on the Metamucil as he complained of significant diarrhea after APC treatment previously. Further recommendations to follow.  

## 2013-07-13 NOTE — Discharge Instructions (Signed)
Standard sigmoidoscopy instructions provided  The Xylocaine jelly provided 3 times a day as needed for transient rectal discomfort  Begin Questran 2 g orally daily not to be taken within 2 hours of other medications. This is given to firm up stool  a bit and not for treating a lipid issue  Office visit with [place] in 6 weeks    Flexible Sigmoidoscopy Your caregiver has ordered a flexible sigmoidoscopy. This is an exam to evaluate your lower colon. In this exam your colon is cleansed and a short fiber optic tube is inserted through your rectum and into your colon. The fiber optic scope (endoscope) is a short bundle of enclosed flexible small glass fibers. It transmits light to the area examined and images from that area to your caregiver. You do not have to worry about glass breakage in the endoscope. Discomfort is usually minimal. Sedatives and pain medications are generally not required. This exam helps to detect tumors (lumps), polyps, inflammation (swelling and soreness), and areas of bleeding. It may also be used to take biopsies. These are small pieces of tissue taken to examine under a microscope. LET YOUR CAREGIVER KNOW ABOUT:  Allergies.  Medications taken including herbs, eye drops, over the counter medications, and creams.  Use of steroids (by mouth or creams).  Previous problems with anesthetics or novocaine  Possibility of pregnancy, if this applies.  History of blood clots (thrombophlebitis).  History of bleeding or blood problems.  Previous surgery.  Other health problems. BEFORE THE PROCEDURE Eat normally the night before the exam. Your caregiver may order a mild enema or laxative the night before. No eating or drinking should occur after midnight until the procedure is completed. A rectal suppository or enemas may be given in the morning prior to your procedure. You will be brought to the examination area in a hospital gown. You should be present 60 minutes prior to your  procedure or as directed.  AFTER THE PROCEDURE   There is sometimes a little blood passed with the first bowel movement. Do not be concerned. Because air is often used during the exam, it is not unusual to pass gas and experience abdominal (belly) cramping. Walking or a warm pack on your abdomen may help with this. Do not sleep with a heating pad as burns can occur.  You may resume all normal eating and activities.  Only take over-the-counter or prescription medicines for pain, discomfort, or fever as directed by your caregiver. Do not use aspirin or blood thinners if a biopsy (tissue sample) was taken. Consult your caregiver for medication usage if biopsies were taken.  Call for your results as instructed by your caregiver. Remember, it is your responsibility to obtain the results of your biopsy. Do not assume everything is fine because you do not hear from your caregiver. SEEK IMMEDIATE MEDICAL CARE IF:  An oral temperature above 102 F (38.9 C) develops.  You pass large blood clots or fill a toilet with blood following the procedure. This may also occur 10 to 14 days following the procedure. It is more likely if a biopsy was taken.  You develop abdominal pain not relieved with medication or that is getting worse rather than better. Document Released: 05/09/2000 Document Revised: 08/04/2011 Document Reviewed: 02/19/2005 [facility] Patient Information 2014 [place].

## 2013-07-13 NOTE — Op Note (Signed)
NAME:  Calvin Moreno, Calvin Moreno              ACCOUNT NO.:  [id number]  MEDICAL RECORD NO.:  [id number]  LOCATION:  [facility]                          FACILITY:  [facility]  PHYSICIAN:  R. Garfield Cornea, MD [place]:  06-14-1940  DATE OF PROCEDURE:  07/13/2013 DATE OF DISCHARGE:                              OPERATIVE REPORT   PROCEDURE PERFORMED:  Sigmoidoscopy with argon plasma coagulation ablation.  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  This a.m. intermittent diarrhea, sigmoidoscopy now being done with intent on ablating the neovascularization with APC treatment.  Risks, benefits, limitations, alternatives have been reviewed.  Questions answered. Please see the documentation medical record.  PROCEDURE NOTE:  O2 saturation, blood pressure, pulse, respirations monitored throughout the entirety of the procedure.  CONSCIOUS SEDATION:  Versed 7 mg IV Demerol 125 mg IV in divided doses. Zofran 4 mg IV.  INSTRUMENT:  Pentax video chip colonoscope.  FINDINGS:  Digital rectal exam revealed no abnormalities.  Endoscopic findings revealed diffuse areas of neovascularization and active oozing along the distal anterior rectal wall, otherwise the rectal mucosa appeared normal.  Scope was advanced into the sigmoid colon in one-to- one fashion to 40 cm.  The patient had a couple of diverticula, otherwise the colonic mucosa at 40 cm appeared normal from this level, scope was slowly withdrawn.  All previously mucosal surfaces were again seen.  No other abnormalities were observed.  Utilizing the APC circular probe set on rectal settings, the neovascular mucosa was ablated with multiple applications of 20 joules each.  This was done without difficulty or apparent complication.  The distal and inflamed rectal mucosa was treated with topical Xylocaine jelly following the procedure. The patient tolerated the  procedure well.  IMPRESSION: 1. Neovascular changes of the rectum with active oozing consistent     with chronic radiation proctitis.  Otherwise, negative     sigmoidoscopy to 40 cm. 2. Status post argon plasma coagulation ablation as described above.  RECOMMENDATIONS: 1. Questran 2 g daily off label, use reviewed with the patient.  Not     to be taken within 2 hours before or after other medications.  This     will be done to form up his stool a bit and diminished stooling     frequency. 2. Office visit with [place] in 6 weeks.     Bridgette Habermann, MD Quentin Ore     RMR/MEDQ  D:  07/13/2013  T:  07/13/2013  Job:  [id number]

## 2013-07-13 NOTE — Interval H&P Note (Signed)
History and Physical Interval Note:  07/13/2013 11:44 AM  Calvin Moreno  has presented today for surgery, with the diagnosis of RADIATION PROCTITIS  The various methods of treatment have been discussed with the patient and family. After consideration of risks, benefits and other options for treatment, the patient has consented to  Procedure(s) with comments: FLEXIBLE SIGMOIDOSCOPY (N/A) - 12:15 HOT HEMOSTASIS (ARGON PLASMA COAGULATION/BICAP) (N/A) as a surgical intervention .  The patient's history has been reviewed, patient examined, no change in status, stable for surgery.  I have reviewed the patient's chart and labs.  Questions were answered to the patient's satisfaction.   No change. Sigmoidoscopy with therapeutic intervention per plan.   Risks, benefits, alternatives and imponderables reviewed. All parties agreeable.   Manus Rudd

## 2013-07-15 ENCOUNTER — Encounter (HOSPITAL_COMMUNITY): Payer: Self-pay | Admitting: Internal Medicine

## 2013-07-25 ENCOUNTER — Encounter: Payer: Self-pay | Admitting: Cardiology

## 2013-07-25 ENCOUNTER — Ambulatory Visit (INDEPENDENT_AMBULATORY_CARE_PROVIDER_SITE_OTHER): Payer: Medicare Other | Admitting: Cardiology

## 2013-07-25 VITALS — BP 153/87 | HR 118 | Ht 66.0 in | Wt 182.0 lb

## 2013-07-25 DIAGNOSIS — I359 Nonrheumatic aortic valve disorder, unspecified: Secondary | ICD-10-CM

## 2013-07-25 DIAGNOSIS — E785 Hyperlipidemia, unspecified: Secondary | ICD-10-CM

## 2013-07-25 DIAGNOSIS — I10 Essential (primary) hypertension: Secondary | ICD-10-CM

## 2013-07-25 DIAGNOSIS — I350 Nonrheumatic aortic (valve) stenosis: Secondary | ICD-10-CM

## 2013-07-25 NOTE — Patient Instructions (Signed)
Your physician recommends that you schedule a follow-up appointment in: 1 year with Dr Bryna Colander will receive a reminder letter two months in advance reminding you to call and schedule your appointment. If you don't receive this letter, please contact our office.  Your physician has recommended you make the following change in your medication:  Please start Aspirin 81 mg daily Stop Aspirin 325 mg

## 2013-07-25 NOTE — Progress Notes (Signed)
Clinical Summary [name] is a 73 y.o.male former patient of Dr Calvin Moreno, this is our first visit together. He is seen for the following medical problems.   1. HTN - does not check regularly at home - compliant with meds  2. Hyperlipidemia - compliant with statin - last panel in our system 05/2012: TC 124 HDL 31 TG 207 LDL 52  3. Aortic stenosis - recent follow up echo completed, mild to moderate AS (mean grad 11, AVA 1.2 by planimetry (continuity values not reported) - denies any chest pain, no SOB, no syncope   Past Medical History  Diagnosis Date  . Aortic stenosis, mild 2000    And insufficiency, evaluation by Dr. Verl Moreno in 2000  . Hypertension     Mild internal carotid artery plaque on MRI/MRA in 2002  . Hyperlipidemia     Elevated triglycerides  . Left bundle Calvin Moreno block 2012    2012  . Tobacco abuse, in remission     60 pack years; quit in 1999  . Insomnia   . Obesity   . Nephrolithiasis 08/2009    2011  . Chronic kidney disease     Borderline; creatinine of 1.6 in 08/2009, but 1.15 in 05/2010  . Benign prostatic hypertrophy     PSA of 1.39 in 09/2010  . Chronic sinusitis     By MRI  . Metabolic syndrome     Fasting hyperglycemia  . Prostate cancer 11/2010    s/p XRT  . Murmur   . Stroke     TIA  . IBS (irritable bowel syndrome)   . Arthritis   . Diverticulosis 2013  . Proctitis     radiation induced  . Hiatal hernia 2006  . Hemorrhoids 2006     Allergies  Allergen Reactions  . Codeine Nausea And Vomiting     Current Outpatient Prescriptions  Medication Sig Dispense Refill  . acyclovir (ZOVIRAX) 800 MG tablet Take 800 mg by mouth 3 (three) times daily.       Marland Kitchen ALPRAZolam (XANAX) 0.5 MG tablet Take 0.5 mg by mouth at bedtime as needed. For sleep      . aspirin 325 MG tablet Take 325 mg by mouth daily.        . cholecalciferol (VITAMIN D) 1000 UNITS tablet Take 1,000 Units by mouth daily.        . clidinium-chlordiazePOXIDE (LIBRAX) 2.5-5 MG  per capsule Take 1 capsule by mouth 4 (four) times daily -  before meals and at bedtime.      . fexofenadine (ALLEGRA) 180 MG tablet Take 180 mg by mouth daily as needed. For allergies      . glycopyrrolate (ROBINUL) 2 MG tablet Take 2 mg by mouth 2 (two) times daily as needed.       . meclizine (ANTIVERT) 25 MG tablet Take 25 mg by mouth at bedtime.       . Melatonin 3 MG TABS Take 1 tablet by mouth at bedtime as needed. For sleep      . Omega-3 Fatty Acids (OMEGA-3 FISH OIL) 1200 MG CAPS Take 1 capsule by mouth daily.        . simvastatin (ZOCOR) 20 MG tablet Take 20 mg by mouth every evening.      . terazosin (HYTRIN) 5 MG capsule Take 10 mg by mouth at bedtime.       . vitamin C (ASCORBIC ACID) 500 MG tablet Take 1,000 mg by mouth daily.  No current facility-administered medications for this visit.     Past Surgical History  Procedure Laterality Date  . Cholecystectomy    . Tonsillectomy    . Colonoscopy  2006    Dr. Ward Moreno mucosa throughout colon, moderate internal hemorrhoids. bx= benign colonic mucosa  . Ankle fracture surgery      left  . Colonoscopy  12/03/2011    Dr. Gala Moreno- colonic diverticulosis, radiation-induced proctitis- s/p APC ablation, no microscopic colitis on bx  . Esophagogastroduodenoscopy  2006    Dr. Oletta Moreno- small hiatal hernia and slightly reddened distal esophagus o/w normal  . Ileocolonoscopy  12/03/2011    [id number] diverticulosis. Radiation-induced proctitis status post APC ablation. Status post segmental colon Biopsy  . Flexible sigmoidoscopy N/A 07/13/2013    Procedure: FLEXIBLE SIGMOIDOSCOPY;  Surgeon: Calvin Dolin, MD;  Location: [facility];  Service: Endoscopy;  Laterality: N/A;  12:15  . Hot hemostasis N/A 07/13/2013    Procedure: HOT HEMOSTASIS (ARGON PLASMA COAGULATION/BICAP);  Surgeon: Calvin Dolin, MD;  Location: [facility];  Service: Endoscopy;  Laterality: N/A;     Allergies  Allergen Reactions  . Codeine Nausea And  Vomiting      Family History  Problem Relation Age of Onset  . Heart failure Mother   . Heart attack Father   . Heart failure Brother     bladder cancer (agent orange exposure)  . Cirrhosis Brother     chronic viral hepatitis  . Colon cancer Neg Hx      Social History [name] reports that he quit smoking about 15 years ago. He has never used smokeless tobacco. [name] reports that he does not drink alcohol.   Review of Systems CONSTITUTIONAL: No weight loss, fever, chills, weakness or fatigue.  HEENT: Eyes: No visual loss, blurred vision, double vision or yellow sclerae.No hearing loss, sneezing, congestion, runny nose or sore throat.  SKIN: No rash or itching.  CARDIOVASCULAR: per HPI RESPIRATORY: No shortness of breath, cough or sputum.  GASTROINTESTINAL: No anorexia, nausea, vomiting or diarrhea. No abdominal pain or blood.  GENITOURINARY: No burning on urination, no polyuria NEUROLOGICAL: No headache, dizziness, syncope, paralysis, ataxia, numbness or tingling in the extremities. No change in bowel or bladder control.  MUSCULOSKELETAL: No muscle, back pain, joint pain or stiffness.  LYMPHATICS: No enlarged nodes. No history of splenectomy.  PSYCHIATRIC: No history of depression or anxiety.  ENDOCRINOLOGIC: No reports of sweating, cold or heat intolerance. No polyuria or polydipsia.  Marland Kitchen   Physical Examination p 115 bp 140/70 Wt 182 lbs BMI 29 Gen: resting comfortably, no acute distress HEENT: no scleral icterus, pupils equal round and reactive, no palptable cervical adenopathy,  CV: RRR, 2/6 early peaking systolic murmur RUSB Resp: Clear to auscultation bilaterally GI: abdomen is soft, non-tender, non-distended, normal bowel sounds, no hepatosplenomegaly MSK: extremities are warm, no edema.  Skin: warm, no rash Neuro:  no focal deficits Psych: appropriate affect   Diagnostic Studies 07/2012 Echo LVEF 55-60%, no WMA, mild-mod AS (mean grad 11, AVA by  planimetry 1.2, continuity area not listed)    Assessment and Plan  1. HTN - at goal, continue current meds  2. Hyperlipidemia - at goal,continue current statin. Encouraged on dietary and lifestyle modifications to help lower TGs and increase HDL  3. Aortic stenosis - mild to moderate by most recent echo - denies any significant symptoms - continue to follow clinically.    Follow 1 year  Arnoldo Lenis, M.D., F.A.C.C.

## 2013-07-26 ENCOUNTER — Telehealth: Payer: Self-pay | Admitting: *Deleted

## 2013-07-26 NOTE — Telephone Encounter (Signed)
Spoke with pt- he was taking the [place] and got constipated. He went 3 days without a bm, finally had one today and it was very hard. He stopped the [place] and wants to know what he can do to soften the stool. Advised him to try otc miralax prn. He asked about a stool softener and I advised him about otc colace. Pt stated he would try colace first and let us know how he was doing. Asked him if he has ever tried any fiber and he said yes and that fiber works too good on him and gives him diarrhea. Pt will call back if he continues to have problems.

## 2013-07-26 NOTE — Telephone Encounter (Signed)
Pt called saying Dr. Gala Romney did a lower rectal check 07/13/13 and the last 3 days pt says he can't go to the bathroom, pt would like to know what does he need to do. Please advise [phone]

## 2013-07-27 NOTE — Telephone Encounter (Signed)
I'm glad we finally controlled his diarrhea. Once he gets his bowels moving and would consider Questran 2 g every other day along with Colace daily. This should combination should effectively combat diarrhea producing an easy stool without constipation

## 2013-08-04 ENCOUNTER — Encounter: Payer: Self-pay | Admitting: Cardiology

## 2013-08-25 ENCOUNTER — Encounter (INDEPENDENT_AMBULATORY_CARE_PROVIDER_SITE_OTHER): Payer: Self-pay

## 2013-08-25 ENCOUNTER — Encounter: Payer: Self-pay | Admitting: Gastroenterology

## 2013-08-25 ENCOUNTER — Ambulatory Visit (INDEPENDENT_AMBULATORY_CARE_PROVIDER_SITE_OTHER): Payer: Medicare Other | Admitting: Gastroenterology

## 2013-08-25 VITALS — BP 139/73 | HR 86 | Temp 97.1°F | Ht 66.0 in | Wt 182.6 lb

## 2013-08-25 DIAGNOSIS — K627 Radiation proctitis: Secondary | ICD-10-CM

## 2013-08-25 DIAGNOSIS — K6289 Other specified diseases of anus and rectum: Secondary | ICD-10-CM

## 2013-08-25 MED ORDER — MESALAMINE 1000 MG RE SUPP: 1000.0000 mg | Freq: Every day | RECTAL | Status: DC

## 2013-08-25 NOTE — Progress Notes (Signed)
cc'd to pcp 

## 2013-08-25 NOTE — Patient Instructions (Signed)
I have sent in a suppository for you to place per rectum each night for 2 weeks.   Continue Questran every other day or every few days as needed for loose stool.   We will see you back in 4 weeks!

## 2013-08-25 NOTE — Assessment & Plan Note (Signed)
With APC ablation Feb 2015; rectal bleeding resolved but notes intermittent rectal discomfort/pressure. Will provide [place] suppositories each evening for 2 weeks but have provided enough for a month if needed. Continue small dosing of supplemental fiber each evening with Questran prn . Return in 4 weeks.

## 2013-08-25 NOTE — Progress Notes (Signed)
Referring Provider: Robert Bellow, MD Primary Care Physician:  Robert Bellow, MD Primary GI: Dr. Gala Romney   Chief Complaint  Patient presents with  . Follow-up    having some problems since procedure.    HPI:   Calvin Moreno presents today in follow-up after ABC ablation for radiation-induced proctitis. Rectal bleeding resolved. Notes he goes from one extreme to the other (loose stool to constipation) but has figured out a way to "balance" it out. Takes a small amount of Metamucil each evening and Questran approximately 1-2 times a week. This has provided more predictable bowel habits. Occasionally will have postprandial loose stool if going out to eat. Notes rectal discomfort/pressure intermittently, without aggravating or relieving factors.    Past Medical History  Diagnosis Date  . Aortic stenosis, mild 2000    And insufficiency, evaluation by Dr. Verl Blalock in 2000  . Hypertension     Mild internal carotid artery plaque on MRI/MRA in 2002  . Hyperlipidemia     Elevated triglycerides  . Left bundle branch block 2012    2012  . Tobacco abuse, in remission     60 pack years; quit in 1999  . Insomnia   . Obesity   . Nephrolithiasis 08/2009    2011  . Chronic kidney disease     Borderline; creatinine of 1.6 in 08/2009, but 1.15 in 05/2010  . Benign prostatic hypertrophy     PSA of 1.39 in 09/2010  . Chronic sinusitis     By MRI  . Metabolic syndrome     Fasting hyperglycemia  . Prostate cancer 11/2010    s/p XRT  . Murmur   . Stroke     TIA  . IBS (irritable bowel syndrome)   . Arthritis   . Diverticulosis 2013  . Proctitis     radiation induced  . Hiatal hernia 2006  . Hemorrhoids 2006    Past Surgical History  Procedure Laterality Date  . Cholecystectomy    . Tonsillectomy    . Colonoscopy  2006    Dr. Ward Givens mucosa throughout colon, moderate internal hemorrhoids. bx= benign colonic mucosa  . Ankle fracture surgery      left  . Colonoscopy   12/03/2011    Dr. Gala Romney- colonic diverticulosis, radiation-induced proctitis- s/p APC ablation, no microscopic colitis on bx  . Esophagogastroduodenoscopy  2006    Dr. Oletta Lamas- small hiatal hernia and slightly reddened distal esophagus o/w normal  . Ileocolonoscopy  12/03/2011    [id number] diverticulosis. Radiation-induced proctitis status post APC ablation. Status post segmental colon Biopsy  . Flexible sigmoidoscopy N/A 07/13/2013    [id number] changes of the rectum with active oozing consistent with chronic radiation proctitis.  Otherwise, negative sigmoidoscopy to 40 cm.Status post argon plasma coagulation ablation   . Hot hemostasis N/A 07/13/2013    Procedure: HOT HEMOSTASIS (ARGON PLASMA COAGULATION/BICAP);  Surgeon: Daneil Dolin, MD;  Location: [facility];  Service: Endoscopy;  Laterality: N/A;    Current Outpatient Prescriptions  Medication Sig Dispense Refill  . acyclovir (ZOVIRAX) 800 MG tablet Take 800 mg by mouth 3 (three) times daily.       Marland Kitchen ALPRAZolam (XANAX) 0.5 MG tablet Take 0.5 mg by mouth at bedtime as needed. For sleep      . aspirin EC 81 MG tablet Take 81 mg by mouth daily.      . cholecalciferol (VITAMIN D) 1000 UNITS tablet Take 1,000 Units by mouth daily.        Marland Kitchen  cholestyramine (QUESTRAN) 4 GM/DOSE powder Take 2 g by mouth as needed.      . clidinium-chlordiazePOXIDE (LIBRAX) 2.5-5 MG per capsule Take 1 capsule by mouth 4 (four) times daily -  before meals and at bedtime.      . fexofenadine (ALLEGRA) 180 MG tablet Take 180 mg by mouth daily as needed. For allergies      . glycopyrrolate (ROBINUL) 2 MG tablet Take 2 mg by mouth 2 (two) times daily as needed.       . meclizine (ANTIVERT) 25 MG tablet Take 25 mg by mouth at bedtime.       . Melatonin 3 MG TABS Take 1 tablet by mouth at bedtime as needed. For sleep      . Omega-3 Fatty Acids (OMEGA-3 FISH OIL) 1200 MG CAPS Take 1 capsule by mouth daily.        . polyethylene glycol (MIRALAX / GLYCOLAX)  packet Take 17 g by mouth daily as needed.      . simvastatin (ZOCOR) 20 MG tablet Take 20 mg by mouth every evening.      . terazosin (HYTRIN) 5 MG capsule Take 10 mg by mouth at bedtime.       . vitamin C (ASCORBIC ACID) 500 MG tablet Take 1,000 mg by mouth daily.        No current facility-administered medications for this visit.    Allergies as of 08/25/2013 - Review Complete 08/25/2013  Allergen Reaction Noted  . Codeine Nausea And Vomiting 11/06/2011    Family History  Problem Relation Age of Onset  . Heart failure Mother   . Heart attack Father   . Heart failure Brother     bladder cancer (agent orange exposure)  . Cirrhosis Brother     chronic viral hepatitis  . Colon cancer Neg Hx     History   Social History  . Marital Status: Married    Spouse Name: N/A    Number of Children: 2  . Years of Education: N/A   Occupational History  . Retired [employer]   Social History Main Topics  . Smoking status: Former Smoker -- 1.50 packs/day for 40 years    Quit date: 11/04/1997  . Smokeless tobacco: Never Used     Comment: Quit in 1999  . Alcohol Use: No  . Drug Use: No  . Sexual Activity: None   Other Topics Concern  . None   Social History Narrative   Married with 2 children   [place]ives locally   No regular exercise    Review of Systems: Gen: Denies fever, chills, anorexia. Denies fatigue, weakness, weight loss.  CV: Denies chest pain, palpitations, syncope, peripheral edema, and claudication. Resp: Denies dyspnea at rest, cough, wheezing, coughing up blood, and pleurisy. GI: Denies vomiting blood, jaundice, and fecal incontinence.   Denies dysphagia or odynophagia. Derm: Denies rash, itching, dry skin Psych: Denies depression, anxiety, memory loss, confusion. No homicidal or suicidal ideation.  Heme: Denies bruising, bleeding, and enlarged lymph nodes.  Physical Exam: BP 139/73  Pulse 86  Temp(Src) 97.1 F (36.2 C) (Oral)  Ht 5\' 6"  (1.676 m)  Wt  182 lb 9.6 oz (82.827 kg)  BMI 29.49 kg/m2 General:   Alert and oriented. No distress noted. Pleasant and cooperative.  Head:  Normocephalic and atraumatic. Ears: HOH Mouth:  Oral mucosa pink and moist. Good dentition. No lesions. Abdomen:  +BS, soft, non-tender and non-distended. No rebound or guarding. ?possible umbilical hernia Extremities:  Without  edema. Neurologic:  Alert and  oriented x4;  grossly normal neurologically. Skin:  Intact without significant lesions or rashes. Psych:  Alert and cooperative. Normal mood and affect.

## 2013-10-04 ENCOUNTER — Encounter (INDEPENDENT_AMBULATORY_CARE_PROVIDER_SITE_OTHER): Payer: Self-pay

## 2013-10-04 ENCOUNTER — Ambulatory Visit (INDEPENDENT_AMBULATORY_CARE_PROVIDER_SITE_OTHER): Payer: Medicare Other | Admitting: Internal Medicine

## 2013-10-04 ENCOUNTER — Encounter: Payer: Self-pay | Admitting: Internal Medicine

## 2013-10-04 VITALS — BP 142/71 | HR 95 | Temp 98.0°F | Ht 66.0 in | Wt 182.4 lb

## 2013-10-04 DIAGNOSIS — K627 Radiation proctitis: Secondary | ICD-10-CM

## 2013-10-04 DIAGNOSIS — K6289 Other specified diseases of anus and rectum: Secondary | ICD-10-CM

## 2013-10-04 NOTE — Progress Notes (Signed)
Primary Care Physician:  Marijo Quizon Bellow, MD Primary Gastroenterologist:  Dr. Gala Romney  Pre-Procedure History & Physical: HPI:  Calvin Moreno is a 73 y.o. male here for rectal bleeding in the setting of known radiation proctitis. Status post APC ablation at sigmoidoscopy earlier this year. Had followup treatment with mesalamine suppositories. He is back today reporting he's done very well;  he has manipulated his Metamucil/cholestyramine regimen a bit. Has 1-3 bowel movements daily. Hasn't seen any further bleeding. He is quite pleased.  Past Medical History  Diagnosis Date  . Aortic stenosis, mild 2000    And insufficiency, evaluation by Dr. Verl Blalock in 2000  . Hypertension     Mild internal carotid artery plaque on MRI/MRA in 2002  . Hyperlipidemia     Elevated triglycerides  . Left bundle branch block 2012    2012  . Tobacco abuse, in remission     60 pack years; quit in 1999  . Insomnia   . Obesity   . Nephrolithiasis 08/2009    2011  . Chronic kidney disease     Borderline; creatinine of 1.6 in 08/2009, but 1.15 in 05/2010  . Benign prostatic hypertrophy     PSA of 1.39 in 09/2010  . Chronic sinusitis     By MRI  . Metabolic syndrome     Fasting hyperglycemia  . Prostate cancer 11/2010    s/p XRT  . Murmur   . Stroke     TIA  . IBS (irritable bowel syndrome)   . Arthritis   . Diverticulosis 2013  . Proctitis     radiation induced  . Hiatal hernia 2006  . Hemorrhoids 2006    Past Surgical History  Procedure Laterality Date  . Cholecystectomy    . Tonsillectomy    . Colonoscopy  2006    Dr. Ward Givens mucosa throughout colon, moderate internal hemorrhoids. bx= benign colonic mucosa  . Ankle fracture surgery      left  . Colonoscopy  12/03/2011    Dr. Gala Romney- colonic diverticulosis, radiation-induced proctitis- s/p APC ablation, no microscopic colitis on bx  . Esophagogastroduodenoscopy  2006    Dr. Oletta Lamas- small hiatal hernia and slightly reddened distal  esophagus o/w normal  . Ileocolonoscopy  12/03/2011    DVV:OHYWVPX diverticulosis. Radiation-induced proctitis status post APC ablation. Status post segmental colon Biopsy  . Flexible sigmoidoscopy N/A 07/13/2013    TGG:YIRSWNIOEVO changes of the rectum with active oozing consistent with chronic radiation proctitis.  Otherwise, negative sigmoidoscopy to 40 cm.Status post argon plasma coagulation ablation   . Hot hemostasis N/A 07/13/2013    Procedure: HOT HEMOSTASIS (ARGON PLASMA COAGULATION/BICAP);  Surgeon: Daneil Dolin, MD;  Location: [facility];  Service: Endoscopy;  Laterality: N/A;    Prior to Admission medications   Medication Sig Start Date End Date Taking? Authorizing Provider  acyclovir (ZOVIRAX) 800 MG tablet Take 800 mg by mouth 3 (three) times daily.    Yes Historical Provider, MD  ALPRAZolam Duanne Moron) 0.5 MG tablet Take 0.5 mg by mouth at bedtime as needed. For sleep   Yes Historical Provider, MD  aspirin EC 81 MG tablet Take 81 mg by mouth daily.   Yes Historical Provider, MD  cholecalciferol (VITAMIN D) 1000 UNITS tablet Take 1,000 Units by mouth daily.     Yes Historical Provider, MD  cholestyramine Lucrezia Starch) 4 GM/DOSE powder Take 2 g by mouth as needed.   Yes Historical Provider, MD  clidinium-chlordiazePOXIDE (LIBRAX) 2.5-5 MG per capsule Take 1 capsule  by mouth 4 (four) times daily -  before meals and at bedtime.   Yes Historical Provider, MD  fexofenadine (ALLEGRA) 180 MG tablet Take 180 mg by mouth daily as needed. For allergies   Yes Historical Provider, MD  glycopyrrolate (ROBINUL) 2 MG tablet Take 2 mg by mouth 2 (two) times daily as needed.    Yes Historical Provider, MD  meclizine (ANTIVERT) 25 MG tablet Take 25 mg by mouth at bedtime.    Yes Historical Provider, MD  Melatonin 3 MG TABS Take 1 tablet by mouth at bedtime as needed. For sleep   Yes Historical Provider, MD  mesalamine (CANASA) 1000 MG suppository Place 1 suppository (1,000 mg total) rectally at bedtime.  For 14 days 08/25/13  Yes Orvil Feil, NP  Omega-3 Fatty Acids (OMEGA-3 FISH OIL) 1200 MG CAPS Take 1 capsule by mouth daily.     Yes Historical Provider, MD  polyethylene glycol (MIRALAX / GLYCOLAX) packet Take 17 g by mouth daily as needed.   Yes Historical Provider, MD  simvastatin (ZOCOR) 20 MG tablet Take 20 mg by mouth every evening.   Yes Historical Provider, MD  terazosin (HYTRIN) 5 MG capsule Take 10 mg by mouth at bedtime.    Yes Historical Provider, MD  vitamin C (ASCORBIC ACID) 500 MG tablet Take 1,000 mg by mouth daily.    Yes Historical Provider, MD    Allergies as of 10/04/2013 - Review Complete 10/04/2013  Allergen Reaction Noted  . Codeine Nausea And Vomiting 11/06/2011    Family History  Problem Relation Age of Onset  . Heart failure Mother   . Heart attack Father   . Heart failure Brother     bladder cancer (agent orange exposure)  . Cirrhosis Brother     chronic viral hepatitis  . Colon cancer Neg Hx     History   Social History  . Marital Status: Married    Spouse Name: N/A    Number of Children: 2  . Years of Education: N/A   Occupational History  . Retired [employer]   Social History Main Topics  . Smoking status: Former Smoker -- 1.50 packs/day for 40 years    Quit date: 11/04/1997  . Smokeless tobacco: Never Used     Comment: Quit in 1999  . Alcohol Use: No  . Drug Use: No  . Sexual Activity: Not on file   Other Topics Concern  . Not on file   Social History Narrative   Married with 2 children   Lives locally   No regular exercise    Review of Systems: See HPI, otherwise negative ROS  Physical Exam: BP 142/71  Pulse 95  Temp(Src) 98 F (36.7 C) (Oral)  Ht 5\' 6"  (1.676 m)  Wt 182 lb 6.4 oz (82.736 kg)  BMI 29.45 kg/m2 General:   Alert,  Well-developed, well-nourished, pleasant and cooperative in NAD Detailed exam deferred   Impression:  Radiation proctitis-clinically significantly improved now. He's manipulated the  timing and dosing of Metamucil and Questran just a bit to optimize bowel function.  Recommendations:  Continue Metamucil and cholestyramine daily. Will allow him some latitude in dosing and frequency as long as he adheres to dosing precautions with cholestyramine and does not exceed 4 g daily. Office visit here in 6 months and when necessary.     Notice: This dictation was prepared with Dragon dictation along with smaller phrase technology. Any transcriptional errors that result from this process are unintentional and may  not be corrected upon review.

## 2013-10-04 NOTE — Patient Instructions (Addendum)
Continue Questran and Metamucil as directed  Office visit with [place] in 6 months

## 2013-12-07 ENCOUNTER — Encounter (HOSPITAL_COMMUNITY): Payer: Self-pay | Admitting: Pharmacy Technician

## 2013-12-09 NOTE — Patient Instructions (Signed)
Calvin Moreno  12/09/2013   Your procedure is scheduled on:  12/19/2013  Report to [facility] at 6:15 AM.  Call this number if you have problems the morning of surgery: [phone]   Remember:   Do not eat food or drink liquids after midnight.   Take these medicines the morning of surgery with A SIP OF WATER: Zovarax, Xanax, Allegra, Hytrin and Antivert if needed   Do not wear jewelry, make-up or nail polish.  Do not wear lotions, powders, or perfumes. You may wear deodorant.  Do not shave 48 hours prior to surgery. Men may shave face and neck.  Do not bring valuables to the hospital.  [facility] is not responsible for any belongings or valuables.               Contacts, dentures or bridgework may not be worn into surgery.  Leave suitcase in the car. After surgery it may be brought to your room.  For patients admitted to the hospital, discharge time is determined by your treatment team.               Patients discharged the day of surgery will not be allowed to drive home.  Name and phone number of your driver:   Special Instructions: N/A   Please read over the following fact sheets that you were given: Anesthesia Post-op Instructions and Care and Recovery After Surgery   PATIENT INSTRUCTIONS POST-ANESTHESIA  IMMEDIATELY FOLLOWING SURGERY:  Do not drive or operate machinery for the first twenty four hours after surgery.  Do not make any important decisions for twenty four hours after surgery or while taking narcotic pain medications or sedatives.  If you develop intractable nausea and vomiting or a severe headache please notify your doctor immediately.  FOLLOW-UP:  Please make an appointment with your surgeon as instructed. You do not need to follow up with anesthesia unless specifically instructed to do so.  WOUND CARE INSTRUCTIONS (if applicable):  Keep a dry clean dressing on the anesthesia/puncture wound site if there is drainage.  Once the wound has quit draining you may  leave it open to air.  Generally you should leave the bandage intact for twenty four hours unless there is drainage.  If the epidural site drains for more than 36-48 hours please call the anesthesia department.  QUESTIONS?:  Please feel free to call your physician or the hospital operator if you have any questions, and they will be happy to assist you.      Cataract Surgery Care After Refer to this sheet in the next few weeks. These instructions provide you with information on caring for yourself after your procedure. Your caregiver may also give you more specific instructions. Your treatment has been planned according to current medical practices, but problems sometimes occur. Call your caregiver if you have any problems or questions after your procedure.  HOME CARE INSTRUCTIONS   Avoid strenuous activities as directed by your caregiver.  Ask your caregiver when you can resume driving.  Use eyedrops or other medicines to help healing and control pressure inside your eye as directed by your caregiver.  Only take over-the-counter or prescription medicines for pain, discomfort, or fever as directed by your caregiver.  Do not to touch or rub your eyes.  You may be instructed to use a protective shield during the first few days and nights after surgery. If not, wear sunglasses to protect your eyes. This is to protect the eye from pressure or from  being accidentally bumped.  Keep the area around your eye clean and dry. Avoid swimming or allowing water to hit you directly in the face while showering. Keep soap and shampoo out of your eyes.  Do not bend or lift heavy objects. Bending increases pressure in the eye. You can walk, climb stairs, and do light household chores.  Do not put a contact lens into the eye that had surgery until your caregiver says it is okay to do so.  Ask your doctor when you can return to work. This will depend on the kind of work that you do. If you work in a dusty  environment, you may be advised to wear protective eyewear for a period of time.  Ask your caregiver when it will be safe to engage in sexual activity.  Continue with your regular eye exams as directed by your caregiver. What to expect:  It is normal to feel itching and mild discomfort for a few days after cataract surgery. Some fluid discharge is also common, and your eye may be sensitive to light and touch.  After 1 to 2 days, even moderate discomfort should disappear. In most cases, healing will take about 6 weeks.  If you received an intraocular lens (IOL), you may notice that colors are very bright or have a blue tinge. Also, if you have been in bright sunlight, everything may appear reddish for a few hours. If you see these color tinges, it is because your lens is clear and no longer cloudy. Within a few months after receiving an IOL, these extra colors should go away. When you have healed, you will probably need new glasses. SEEK MEDICAL CARE IF:   You have increased bruising around your eye.  You have discomfort not helped by medicine. SEEK IMMEDIATE MEDICAL CARE IF:   You have a fever.  You have a worsening or sudden vision loss.  You have redness, swelling, or increasing pain in the eye.  You have a thick discharge from the eye that had surgery. MAKE SURE YOU:  Understand these instructions.  Will watch your condition.  Will get help right away if you are not doing well or get worse. Document Released: 11/29/2004 Document Revised: 08/04/2011 Document Reviewed: 01/03/2011 [facility] Patient Information 2015 [place], [place]. This information is not intended to replace advice given to you by your health care provider. Make sure you discuss any questions you have with your health care provider.

## 2013-12-12 ENCOUNTER — Encounter (HOSPITAL_COMMUNITY)
Admission: RE | Admit: 2013-12-12 | Discharge: 2013-12-12 | Disposition: A | Discharge status: Home / Self Care | Payer: Medicare Other | Source: Ambulatory Visit | Attending: Ophthalmology | Admitting: Ophthalmology

## 2013-12-12 ENCOUNTER — Encounter (HOSPITAL_COMMUNITY): Payer: Self-pay

## 2013-12-12 DIAGNOSIS — Z01812 Encounter for preprocedural laboratory examination: Secondary | ICD-10-CM | POA: Insufficient documentation

## 2013-12-12 HISTORY — DX: Reserved for inherently not codable concepts without codable children: IMO0001

## 2013-12-12 HISTORY — DX: Other seasonal allergic rhinitis: J30.2

## 2013-12-12 HISTORY — DX: Presence of external hearing-aid: Z97.4

## 2013-12-12 HISTORY — DX: Unspecified hearing loss, unspecified ear: H91.90

## 2013-12-12 LAB — HEMOGLOBIN AND HEMATOCRIT, BLOOD
HCT: 44.1 % (ref 39.0–52.0)
Hemoglobin: 15.2 g/dL (ref 13.0–17.0)

## 2013-12-12 LAB — BASIC METABOLIC PANEL
Anion gap: 9 (ref 5–15)
BUN: 11 mg/dL (ref 6–23)
CHLORIDE: 101 meq/L (ref 96–112)
CO2: 29 mEq/L (ref 19–32)
CREATININE: 1.04 mg/dL (ref 0.50–1.35)
Calcium: 9.7 mg/dL (ref 8.4–10.5)
GFR calc Af Amer: 80 mL/min — ABNORMAL LOW (ref >90)
GFR calc non Af Amer: 69 mL/min — ABNORMAL LOW (ref >90)
GLUCOSE: 131 mg/dL — AB (ref 70–99)
POTASSIUM: 4.4 meq/L (ref 3.7–5.3)
Sodium: 139 mEq/L (ref 137–147)

## 2013-12-12 NOTE — Progress Notes (Signed)
12/12/13 1249  OBSTRUCTIVE SLEEP APNEA  Have you ever been diagnosed with sleep apnea through a sleep study? No  Do you snore loudly (loud enough to be heard through closed doors)?  1  Do you often feel tired, fatigued, or sleepy during the daytime? 0  Has anyone observed you stop breathing during your sleep? 0  Do you have, or are you being treated for high blood pressure? 1  BMI more than 35 kg/m2? 0  Age over 75 years old? 1  Neck circumference greater than 40 cm/16 inches? 1  Gender: 1  Obstructive Sleep Apnea Score 5  Score 4 or greater  Results sent to PCP

## 2013-12-16 MED ORDER — CYCLOPENTOLATE-PHENYLEPHRINE OP SOLN OPTIME - NO CHARGE
OPHTHALMIC | Status: AC
  Filled 2013-12-16: qty 2

## 2013-12-16 MED ORDER — NEOMYCIN-POLYMYXIN-DEXAMETH 3.5-10000-0.1 OP SUSP
OPHTHALMIC | Status: AC
  Filled 2013-12-16: qty 5

## 2013-12-16 MED ORDER — TETRACAINE HCL 0.5 % OP SOLN
OPHTHALMIC | Status: AC
  Filled 2013-12-16: qty 2

## 2013-12-16 MED ORDER — LIDOCAINE HCL 3.5 % OP GEL
OPHTHALMIC | Status: AC
  Filled 2013-12-16: qty 1

## 2013-12-16 MED ORDER — PHENYLEPHRINE HCL 2.5 % OP SOLN
OPHTHALMIC | Status: AC
  Filled 2013-12-16: qty 15

## 2013-12-16 MED ORDER — LIDOCAINE HCL (PF) 1 % IJ SOLN
INTRAMUSCULAR | Status: AC
  Filled 2013-12-16: qty 2

## 2013-12-19 ENCOUNTER — Ambulatory Visit (HOSPITAL_COMMUNITY)
Admission: RE | Admit: 2013-12-19 | Discharge: 2013-12-19 | Disposition: A | Discharge status: Home / Self Care | Payer: Medicare Other | Source: Ambulatory Visit | Attending: Ophthalmology | Admitting: Ophthalmology

## 2013-12-19 ENCOUNTER — Encounter (HOSPITAL_COMMUNITY)
Admission: RE | Disposition: A | Discharge status: Home / Self Care | Payer: Self-pay | Source: Ambulatory Visit | Attending: Ophthalmology

## 2013-12-19 ENCOUNTER — Ambulatory Visit (HOSPITAL_COMMUNITY): Payer: Medicare Other | Admitting: Anesthesiology

## 2013-12-19 ENCOUNTER — Encounter (HOSPITAL_COMMUNITY): Payer: Medicare Other | Admitting: Anesthesiology

## 2013-12-19 ENCOUNTER — Encounter (HOSPITAL_COMMUNITY): Payer: Self-pay | Admitting: *Deleted

## 2013-12-19 DIAGNOSIS — K449 Diaphragmatic hernia without obstruction or gangrene: Secondary | ICD-10-CM | POA: Insufficient documentation

## 2013-12-19 DIAGNOSIS — H2510 Age-related nuclear cataract, unspecified eye: Secondary | ICD-10-CM | POA: Insufficient documentation

## 2013-12-19 DIAGNOSIS — Z87891 Personal history of nicotine dependence: Secondary | ICD-10-CM | POA: Insufficient documentation

## 2013-12-19 DIAGNOSIS — I359 Nonrheumatic aortic valve disorder, unspecified: Secondary | ICD-10-CM | POA: Insufficient documentation

## 2013-12-19 HISTORY — PX: CATARACT EXTRACTION W/PHACO: SHX586

## 2013-12-19 SURGERY — PHACOEMULSIFICATION, CATARACT, WITH IOL INSERTION
Anesthesia: Monitor Anesthesia Care | Site: Eye | Laterality: Right

## 2013-12-19 MED ORDER — LIDOCAINE HCL (PF) 1 % IJ SOLN
INTRAOCULAR | Status: DC | PRN
  Administered 2013-12-19: 08:00:00 via OPHTHALMIC

## 2013-12-19 MED ORDER — EPINEPHRINE HCL 1 MG/ML IJ SOLN
INTRAMUSCULAR | Status: AC
  Filled 2013-12-19: qty 1

## 2013-12-19 MED ORDER — LACTATED RINGERS IV SOLN
INTRAVENOUS | Status: DC
  Administered 2013-12-19: 07:00:00 via INTRAVENOUS

## 2013-12-19 MED ORDER — EPINEPHRINE HCL 1 MG/ML IJ SOLN
INTRAOCULAR | Status: DC | PRN
  Administered 2013-12-19: 08:00:00

## 2013-12-19 MED ORDER — MIDAZOLAM HCL 2 MG/2ML IJ SOLN
INTRAMUSCULAR | Status: AC
  Filled 2013-12-19: qty 2

## 2013-12-19 MED ORDER — CYCLOPENTOLATE-PHENYLEPHRINE 0.2-1 % OP SOLN
1.0000 [drp] | OPHTHALMIC | Status: AC
  Administered 2013-12-19 (×3): 1 [drp] via OPHTHALMIC

## 2013-12-19 MED ORDER — FENTANYL CITRATE 0.05 MG/ML IJ SOLN
INTRAMUSCULAR | Status: AC
  Filled 2013-12-19: qty 2

## 2013-12-19 MED ORDER — BSS IO SOLN
INTRAOCULAR | Status: DC | PRN
  Administered 2013-12-19: 15 mL via INTRAOCULAR

## 2013-12-19 MED ORDER — NEOMYCIN-POLYMYXIN-DEXAMETH 3.5-10000-0.1 OP SUSP
OPHTHALMIC | Status: DC | PRN
  Administered 2013-12-19: 1 [drp] via OPHTHALMIC

## 2013-12-19 MED ORDER — PHENYLEPHRINE HCL 2.5 % OP SOLN
1.0000 [drp] | OPHTHALMIC | Status: AC
  Administered 2013-12-19 (×3): 1 [drp] via OPHTHALMIC

## 2013-12-19 MED ORDER — LIDOCAINE HCL 3.5 % OP GEL
1.0000 "application " | Freq: Once | OPHTHALMIC | Status: AC
  Administered 2013-12-19: 1 via OPHTHALMIC

## 2013-12-19 MED ORDER — KETOROLAC TROMETHAMINE 0.5 % OP SOLN: 1.0000 [drp] | OPHTHALMIC | Status: AC

## 2013-12-19 MED ORDER — POVIDONE-IODINE 5 % OP SOLN
OPHTHALMIC | Status: DC | PRN
  Administered 2013-12-19: 1 via OPHTHALMIC

## 2013-12-19 MED ORDER — MIDAZOLAM HCL 2 MG/2ML IJ SOLN
1.0000 mg | INTRAMUSCULAR | Status: DC | PRN
  Administered 2013-12-19: 2 mg via INTRAVENOUS

## 2013-12-19 MED ORDER — FENTANYL CITRATE 0.05 MG/ML IJ SOLN
25.0000 ug | INTRAMUSCULAR | Status: AC
  Administered 2013-12-19 (×2): 25 ug via INTRAVENOUS

## 2013-12-19 MED ORDER — LIDOCAINE 3.5 % OP GEL OPTIME - NO CHARGE
OPHTHALMIC | Status: DC | PRN
  Administered 2013-12-19: 1 [drp] via OPHTHALMIC

## 2013-12-19 MED ORDER — PROVISC 10 MG/ML IO SOLN
INTRAOCULAR | Status: DC | PRN
  Administered 2013-12-19: 0.85 mL via INTRAOCULAR

## 2013-12-19 MED ORDER — LACTATED RINGERS IV SOLN
INTRAVENOUS | Status: DC | PRN
  Administered 2013-12-19: 07:00:00 via INTRAVENOUS

## 2013-12-19 MED ORDER — MIDAZOLAM HCL 5 MG/5ML IJ SOLN
INTRAMUSCULAR | Status: DC | PRN
  Administered 2013-12-19 (×2): 1 mg via INTRAVENOUS

## 2013-12-19 MED ORDER — TETRACAINE HCL 0.5 % OP SOLN
1.0000 [drp] | OPHTHALMIC | Status: AC
  Administered 2013-12-19 (×3): 1 [drp] via OPHTHALMIC

## 2013-12-19 SURGICAL SUPPLY — 11 items
CLOTH BEACON ORANGE TIMEOUT ST (SAFETY) ×3 IMPLANT
EYE SHIELD UNIVERSAL CLEAR (GAUZE/BANDAGES/DRESSINGS) ×3 IMPLANT
GLOVE BIOGEL PI IND STRL 6.5 (GLOVE) ×1 IMPLANT
GLOVE BIOGEL PI INDICATOR 6.5 (GLOVE) ×2
GLOVE EXAM NITRILE MD LF STRL (GLOVE) ×3 IMPLANT
PAD ARMBOARD 7.5X6 YLW CONV (MISCELLANEOUS) ×3 IMPLANT
SIGHTPATH CAT PROC W REG LENS (Ophthalmic Related) ×3 IMPLANT
SYRINGE LUER LOK 1CC (MISCELLANEOUS) ×3 IMPLANT
TAPE SURG TRANSPORE 1 IN (GAUZE/BANDAGES/DRESSINGS) ×1 IMPLANT
TAPE SURGICAL TRANSPORE 1 IN (GAUZE/BANDAGES/DRESSINGS) ×2
WATER STERILE IRR 250ML POUR (IV SOLUTION) ×3 IMPLANT

## 2013-12-19 NOTE — Transfer of Care (Signed)
Immediate Anesthesia Transfer of Care Note  Patient: Calvin Moreno  Procedure(s) Performed: Procedure(s): CATARACT EXTRACTION PHACO AND INTRAOCULAR LENS PLACEMENT RIGHT EYE CDE=14.78 (Right)  Patient Location: Short Stay  Anesthesia Type:MAC  Level of Consciousness: awake, alert , oriented and patient cooperative  Airway & Oxygen Therapy: Patient Spontanous Breathing  Post-op Assessment: Report given to PACU RN and Post -op Vital signs reviewed and stable  Post vital signs: Reviewed and stable  Complications: No apparent anesthesia complications

## 2013-12-19 NOTE — Anesthesia Postprocedure Evaluation (Signed)
  Anesthesia Post-op Note  Patient: Calvin Moreno  Procedure(s) Performed: Procedure(s): CATARACT EXTRACTION PHACO AND INTRAOCULAR LENS PLACEMENT RIGHT EYE CDE=14.78 (Right)  Patient Location: Short Stay  Anesthesia Type:MAC  Level of Consciousness: awake, alert , oriented and patient cooperative  Airway and Oxygen Therapy: Patient Spontanous Breathing  Post-op Pain: none  Post-op Assessment: Post-op Vital signs reviewed, Patient's Cardiovascular Status Stable, Respiratory Function Stable, Patent Airway, No signs of Nausea or vomiting and Pain level controlled  Post-op Vital Signs: Reviewed and stable  Last Vitals:  Filed Vitals:   12/19/13 0715  BP: 136/67  Temp:   Resp: 16    Complications: No apparent anesthesia complications

## 2013-12-19 NOTE — Anesthesia Preprocedure Evaluation (Signed)
Anesthesia Evaluation  Patient identified by MRN, date of birth, ID band Patient awake    Reviewed: Allergy & Precautions, H&P , NPO status , Patient's Chart, lab work & pertinent test results  Airway Mallampati: III TM Distance: >3 FB     Dental  (+) Teeth Intact, Poor Dentition   Pulmonary former smoker,  breath sounds clear to auscultation        Cardiovascular Pt. on medications + Peripheral Vascular Disease (carotid) + dysrhythmias + Valvular Problems/Murmurs AS and AI Rhythm:Regular Rate:Normal     Neuro/Psych TIA   GI/Hepatic hiatal hernia,   Endo/Other    Renal/GU Renal disease     Musculoskeletal   Abdominal   Peds  Hematology   Anesthesia Other Findings   Reproductive/Obstetrics                           Anesthesia Physical Anesthesia Plan  ASA: III  Anesthesia Plan: MAC   Post-op Pain Management:    Induction: Intravenous  Airway Management Planned: Nasal Cannula  Additional Equipment:   Intra-op Plan:   Post-operative Plan:   Informed Consent: I have reviewed the patients History and Physical, chart, labs and discussed the procedure including the risks, benefits and alternatives for the proposed anesthesia with the patient or authorized representative who has indicated his/her understanding and acceptance.     Plan Discussed with:   Anesthesia Plan Comments:         Anesthesia Quick Evaluation

## 2013-12-19 NOTE — Op Note (Signed)
Date of Admission: 12/19/2013  Date of Surgery: 12/19/2013   Pre-Op Dx: Cataract Right Eye  Post-Op Dx: Nuclear Cataract Right  Eye,  Dx Code 366.16  Surgeon: Tonny Branch, M.D.  Assistants: None  Anesthesia: Topical with MAC  Indications: Painless, progressive loss of vision with compromise of daily activities.  Surgery: Cataract Extraction with Intraocular lens Implant Right Eye  Discription: The patient had dilating drops and viscous lidocaine placed into the Right eye in the pre-op holding area. After transfer to the operating room, a time out was performed. The patient was then prepped and draped. Beginning with a 22 degree blade a paracentesis port was made at the surgeon's 2 o'clock position. The anterior chamber was then filled with 1% non-preserved lidocaine. This was followed by filling the anterior chamber with Provisc.  A 2.81mm keratome blade was used to make a clear corneal incision at the temporal limbus.  A bent cystatome needle was used to create a continuous tear capsulotomy. Hydrodissection was performed with balanced salt solution on a Fine canula. The lens nucleus was then removed using the phacoemulsification handpiece. Residual cortex was removed with the I&A handpiece. The anterior chamber and capsular bag were refilled with Provisc. A posterior chamber intraocular lens was placed into the capsular bag with it's injector. The implant was positioned with the Kuglan hook. The Provisc was then removed from the anterior chamber and capsular bag with the I&A handpiece. Stromal hydration of the main incision and paracentesis port was performed with BSS on a Fine canula. The wounds were tested for leak which was negative. The patient tolerated the procedure well. There were no operative complications. The patient was then transferred to the recovery room in stable condition.  Complications: None  Specimen: None  EBL: None  Prosthetic device: Hoya iSert 250, power 20.5 D, SN  [id number].

## 2013-12-19 NOTE — H&P (Signed)
I have reviewed the H&P, the patient was re-examined, and I have identified no interval changes in medical condition and plan of care since the history and physical of record  

## 2013-12-19 NOTE — Discharge Instructions (Signed)

## 2013-12-19 NOTE — Anesthesia Procedure Notes (Signed)
Procedure Name: MAC Date/Time: 12/19/2013 7:25 AM Performed by: Andree Elk, AMY A Pre-anesthesia Checklist: Patient identified, Emergency Drugs available, Suction available, Patient being monitored and Timeout performed Oxygen Delivery Method: Nasal cannula

## 2013-12-20 ENCOUNTER — Encounter (HOSPITAL_COMMUNITY): Payer: Self-pay | Admitting: Ophthalmology

## 2014-03-07 ENCOUNTER — Encounter: Payer: Self-pay | Admitting: Internal Medicine

## 2014-07-31 ENCOUNTER — Ambulatory Visit (INDEPENDENT_AMBULATORY_CARE_PROVIDER_SITE_OTHER): Payer: Medicare Other | Admitting: Cardiology

## 2014-07-31 ENCOUNTER — Encounter: Payer: Self-pay | Admitting: Cardiology

## 2014-07-31 VITALS — BP 150/88 | HR 93 | Ht 66.0 in | Wt 182.8 lb

## 2014-07-31 DIAGNOSIS — I350 Nonrheumatic aortic (valve) stenosis: Secondary | ICD-10-CM

## 2014-07-31 DIAGNOSIS — I447 Left bundle-branch block, unspecified: Secondary | ICD-10-CM

## 2014-07-31 DIAGNOSIS — I10 Essential (primary) hypertension: Secondary | ICD-10-CM

## 2014-07-31 DIAGNOSIS — E785 Hyperlipidemia, unspecified: Secondary | ICD-10-CM

## 2014-07-31 MED ORDER — AMLODIPINE BESYLATE 5 MG PO TABS: 5.0000 mg | ORAL_TABLET | Freq: Every day | ORAL | Status: DC

## 2014-07-31 NOTE — Progress Notes (Signed)
Clinical Summary Mr. Gutridge is a 74 y.o.male seen today for follow up of the following medical problems.   1. HTN - does not check regularly at home - compliant with meds - on hytrin per pcp  2. Hyperlipidemia - compliant with statin - no recent panel in our system, reports recent labs with pcp this AM  3. Aortic stenosis -  mild to moderate AS (mean grad 11, AVA 1.2 by planimetry (continuity values not reported) - denies any chest pain, no SOB, no syncope Past Medical History  Diagnosis Date  . Aortic stenosis, mild 2000    And insufficiency, evaluation by Dr. Verl Blalock in 2000  . Hypertension     Mild internal carotid artery plaque on MRI/MRA in 2002  . Hyperlipidemia     Elevated triglycerides  . Left bundle Sharday Michl block 2012    2012  . Tobacco abuse, in remission     60 pack years; quit in 1999  . Insomnia   . Obesity   . Nephrolithiasis 08/2009    2011  . Chronic kidney disease     Borderline; creatinine of 1.6 in 08/2009, but 1.15 in 05/2010  . Benign prostatic hypertrophy     PSA of 1.39 in 09/2010  . Chronic sinusitis     By MRI  . Metabolic syndrome     Fasting hyperglycemia  . Prostate cancer 11/2010    s/p XRT  . Murmur   . IBS (irritable bowel syndrome)   . Arthritis   . Diverticulosis 2013  . Proctitis     radiation induced  . Hiatal hernia 2006  . Hemorrhoids 2006  . Seasonal allergies   . Stroke     TIA, no residual, seen on MRI  . Hearing impaired   . Uses hearing aid     bilateral     Allergies  Allergen Reactions  . Codeine Nausea And Vomiting     Current Outpatient Prescriptions  Medication Sig Dispense Refill  . acyclovir (ZOVIRAX) 800 MG tablet Take 800 mg by mouth 3 (three) times daily.     Marland Kitchen ALPRAZolam (XANAX) 0.5 MG tablet Take 0.5 mg by mouth at bedtime as needed. For sleep    . aspirin EC 81 MG tablet Take 81 mg by mouth daily.    . cholecalciferol (VITAMIN D) 1000 UNITS tablet Take 1,000 Units by mouth daily.      .  fexofenadine (ALLEGRA) 180 MG tablet Take 180 mg by mouth daily as needed. For allergies    . glycopyrrolate (ROBINUL) 2 MG tablet Take 2 mg by mouth 2 (two) times daily as needed (IBS).     Marland Kitchen meclizine (ANTIVERT) 25 MG tablet Take 25 mg by mouth at bedtime.     . Melatonin 3 MG TABS Take 1 tablet by mouth at bedtime. For sleep    . Omega-3 Fatty Acids (OMEGA-3 FISH OIL) 1200 MG CAPS Take 1 capsule by mouth daily.      . simvastatin (ZOCOR) 20 MG tablet Take 20 mg by mouth every evening.    . terazosin (HYTRIN) 5 MG capsule Take 10 mg by mouth at bedtime.     . vitamin C (ASCORBIC ACID) 500 MG tablet Take 1,000 mg by mouth daily.      No current facility-administered medications for this visit.     Past Surgical History  Procedure Laterality Date  . Cholecystectomy    . Tonsillectomy    . Colonoscopy  2006  Dr. Ward Givens mucosa throughout colon, moderate internal hemorrhoids. bx= benign colonic mucosa  . Ankle fracture surgery      left  . Colonoscopy  12/03/2011    Dr. Gala Romney- colonic diverticulosis, radiation-induced proctitis- s/p APC ablation, no microscopic colitis on bx  . Esophagogastroduodenoscopy  2006    Dr. Oletta Lamas- small hiatal hernia and slightly reddened distal esophagus o/w normal  . Ileocolonoscopy  12/03/2011    [id number] diverticulosis. Radiation-induced proctitis status post APC ablation. Status post segmental colon Biopsy  . Flexible sigmoidoscopy N/A 07/13/2013    [id number] changes of the rectum with active oozing consistent with chronic radiation proctitis.  Otherwise, negative sigmoidoscopy to 40 cm.Status post argon plasma coagulation ablation   . Hot hemostasis N/A 07/13/2013    Procedure: HOT HEMOSTASIS (ARGON PLASMA COAGULATION/BICAP);  Surgeon: Daneil Dolin, MD;  Location: [facility];  Service: Endoscopy;  Laterality: N/A;  . Cataract extraction w/phaco Right 12/19/2013    Procedure: CATARACT EXTRACTION PHACO AND INTRAOCULAR LENS PLACEMENT  RIGHT EYE CDE=14.78;  Surgeon: Tonny Wilferd Ritson, MD;  Location: [facility];  Service: Ophthalmology;  Laterality: Right;     Allergies  Allergen Reactions  . Codeine Nausea And Vomiting      Family History  Problem Relation Age of Onset  . Heart failure Mother   . Heart attack Father   . Heart failure Brother     bladder cancer (agent orange exposure)  . Cirrhosis Brother     chronic viral hepatitis  . Colon cancer Neg Hx      Social History Mr. Nohr reports that he quit smoking about 16 years ago. He has never used smokeless tobacco. Mr. Fentress reports that he does not drink alcohol.   Review of Systems CONSTITUTIONAL: No weight loss, fever, chills, weakness or fatigue.  HEENT: Eyes: No visual loss, blurred vision, double vision or yellow sclerae.No hearing loss, sneezing, congestion, runny nose or sore throat.  SKIN: No rash or itching.  CARDIOVASCULAR: per HPI RESPIRATORY: No shortness of breath, cough or sputum.  GASTROINTESTINAL: No anorexia, nausea, vomiting or diarrhea. No abdominal pain or blood.  GENITOURINARY: No burning on urination, no polyuria NEUROLOGICAL: No headache, dizziness, syncope, paralysis, ataxia, numbness or tingling in the extremities. No change in bowel or bladder control.  MUSCULOSKELETAL: No muscle, back pain, joint pain or stiffness.  LYMPHATICS: No enlarged nodes. No history of splenectomy.  PSYCHIATRIC: No history of depression or anxiety.  ENDOCRINOLOGIC: No reports of sweating, cold or heat intolerance. No polyuria or polydipsia.  Marland Kitchen   Physical Examination p 93 bp 150/88 Wt 182 lbs BMI 30 Gen: resting comfortably, no acute distress HEENT: no scleral icterus, pupils equal round and reactive, no palptable cervical adenopathy,  CV: RRR, 3/6 systolic murmur RUSB, no JVD Resp: Clear to auscultation bilaterally GI: abdomen is soft, non-tender, non-distended, normal bowel sounds, no hepatosplenomegaly MSK: extremities are warm, no edema.  Skin:  warm, no rash Neuro:  no focal deficits Psych: appropriate affect   Diagnostic Studies 07/2012 Echo LVEF 55-60%, no WMA, mild-mod AS (mean grad 11, AVA by planimetry 1.2, continuity area not listed)     Assessment and Plan  1. HTN - elevated in clinic, will start norvasc 5mg  daily  2. Hyperlipidemia - request most recent panel from pcp - continue current statin  3. Aortic stenosis - mild to moderate AS 2 years ago, will repeat echo    F/u 1 year  Arnoldo Lenis, M.D.

## 2014-07-31 NOTE — Patient Instructions (Signed)
Your physician wants you to follow-up in: 1 year with Dr.Branch You will receive a reminder letter in the mail two months in advance. If you don't receive a letter, please call our office to schedule the follow-up appointment.   START Norvasc 5 mg daily     Your physician has requested that you have an echocardiogram. Echocardiography is a painless test that uses sound waves to create images of your heart. It provides your doctor with information about the size and shape of your heart and how well your heart's chambers and valves are working. This procedure takes approximately one hour. There are no restrictions for this procedure.      Thank you for choosing [facility] !

## 2014-08-04 ENCOUNTER — Ambulatory Visit (HOSPITAL_COMMUNITY)
Admission: RE | Admit: 2014-08-04 | Discharge: 2014-08-04 | Disposition: A | Discharge status: Home / Self Care | Payer: Medicare Other | Source: Ambulatory Visit | Attending: Cardiology | Admitting: Cardiology

## 2014-08-04 DIAGNOSIS — I350 Nonrheumatic aortic (valve) stenosis: Secondary | ICD-10-CM | POA: Diagnosis not present

## 2014-08-04 NOTE — Progress Notes (Signed)
  Echocardiogram 2D Echocardiogram has been performed.  Calvin Moreno 08/04/2014, 3:09 PM

## 2014-09-18 ENCOUNTER — Telehealth: Payer: Self-pay | Admitting: Cardiovascular Disease

## 2014-09-18 DIAGNOSIS — I10 Essential (primary) hypertension: Secondary | ICD-10-CM

## 2014-09-18 NOTE — Telephone Encounter (Signed)
Patient states that new medications are making his ankles swell / tg

## 2014-09-18 NOTE — Telephone Encounter (Signed)
Pt started on Norvasc, will forward to Dr Harl Bowie for advise

## 2014-09-19 MED ORDER — HYDROCHLOROTHIAZIDE 25 MG PO TABS: 12.5000 mg | ORAL_TABLET | Freq: Every day | ORAL | Status: DC

## 2014-09-19 NOTE — Telephone Encounter (Signed)
Stop norvasc. Please start HCTZ 12.5mg  daily, needs BMET and Mg in 2 weeks   Zandra Abts MD

## 2014-09-19 NOTE — Telephone Encounter (Signed)
Spoke with pt,escribed rx to [facility],mailed lab slips

## 2014-10-03 LAB — BASIC METABOLIC PANEL
BUN: 13 mg/dL (ref 6–23)
CALCIUM: 9.7 mg/dL (ref 8.4–10.5)
CO2: 30 mEq/L (ref 19–32)
Chloride: 102 mEq/L (ref 96–112)
Creat: 1.16 mg/dL (ref 0.50–1.35)
Glucose, Bld: 116 mg/dL — ABNORMAL HIGH (ref 70–99)
POTASSIUM: 3.7 meq/L (ref 3.5–5.3)
Sodium: 141 mEq/L (ref 135–145)

## 2014-10-03 LAB — MAGNESIUM: Magnesium: 2 mg/dL (ref 1.5–2.5)

## 2015-08-10 ENCOUNTER — Encounter: Payer: Self-pay | Admitting: Cardiology

## 2015-08-10 ENCOUNTER — Ambulatory Visit (INDEPENDENT_AMBULATORY_CARE_PROVIDER_SITE_OTHER): Payer: Medicare Other | Admitting: Cardiology

## 2015-08-10 VITALS — BP 142/76 | HR 122 | Ht 65.0 in | Wt 181.0 lb

## 2015-08-10 DIAGNOSIS — R000 Tachycardia, unspecified: Secondary | ICD-10-CM | POA: Diagnosis not present

## 2015-08-10 DIAGNOSIS — I10 Essential (primary) hypertension: Secondary | ICD-10-CM | POA: Diagnosis not present

## 2015-08-10 DIAGNOSIS — E785 Hyperlipidemia, unspecified: Secondary | ICD-10-CM | POA: Diagnosis not present

## 2015-08-10 DIAGNOSIS — I350 Nonrheumatic aortic (valve) stenosis: Secondary | ICD-10-CM

## 2015-08-10 NOTE — Progress Notes (Addendum)
Patient ID: Calvin Moreno, male   DOB: 08-26-1940, 75 y.o.   MRN: [id number]     Clinical Summary [name] is a 75 y.o.male seen today for follow up of the following medical problems.   1. HTN - compliant with meds. Home bp's typically 100s/70s  2. Hyperlipidemia - compliant with statin - , reports recent labs with pcp   3. Aortic stenosis - mild to moderate AS by echo 07/2014 with mean grad 18, AVA 1.25.   - Denies any SOB, no chest pain, no syncope.   4. Sinus tachcyardia Patient with frequent elevated heart rates in clinic. Checks heart rates regularly at home, typically in low 90s. Denies any palpitations. Does have some white coat anxiety.   Past Medical History  Diagnosis Date  . Aortic stenosis, mild 2000    And insufficiency, evaluation by Dr. Verl Blalock in 2000  . Hypertension     Mild internal carotid artery plaque on MRI/MRA in 2002  . Hyperlipidemia     Elevated triglycerides  . Left bundle Calvin Moreno block 2012    2012  . Tobacco abuse, in remission     60 pack years; quit in 1999  . Insomnia   . Obesity   . Nephrolithiasis 08/2009    2011  . Chronic kidney disease     Borderline; creatinine of 1.6 in 08/2009, but 1.15 in 05/2010  . Benign prostatic hypertrophy     PSA of 1.39 in 09/2010  . Chronic sinusitis     By MRI  . Metabolic syndrome     Fasting hyperglycemia  . Prostate cancer 11/2010    s/p XRT  . Murmur   . IBS (irritable bowel syndrome)   . Arthritis   . Diverticulosis 2013  . Proctitis     radiation induced  . Hiatal hernia 2006  . Hemorrhoids 2006  . Seasonal allergies   . Stroke     TIA, no residual, seen on MRI  . Hearing impaired   . Uses hearing aid     bilateral     Allergies  Allergen Reactions  . Norvasc [Amlodipine Besylate] Swelling    Ankle swelling  . Codeine Nausea And Vomiting     Current Outpatient Prescriptions  Medication Sig Dispense Refill  . acyclovir (ZOVIRAX) 800 MG tablet Take 800 mg by mouth 3 (three)  times daily.     Marland Kitchen ALPRAZolam (XANAX) 0.5 MG tablet Take 0.5 mg by mouth at bedtime as needed. For sleep    . aspirin EC 81 MG tablet Take 81 mg by mouth daily.    . cholecalciferol (VITAMIN D) 1000 UNITS tablet Take 1,000 Units by mouth daily.      . fexofenadine (ALLEGRA) 180 MG tablet Take 180 mg by mouth daily as needed. For allergies    . glycopyrrolate (ROBINUL) 2 MG tablet Take 2 mg by mouth 2 (two) times daily as needed (IBS).     . hydrochlorothiazide (HYDRODIURIL) 25 MG tablet Take 0.5 tablets (12.5 mg total) by mouth daily. 45 tablet 0  . meclizine (ANTIVERT) 25 MG tablet Take 25 mg by mouth at bedtime.     . Melatonin 3 MG TABS Take 1 tablet by mouth at bedtime. For sleep    . Omega-3 Fatty Acids (OMEGA-3 FISH OIL) 1200 MG CAPS Take 1 capsule by mouth daily.      . simvastatin (ZOCOR) 20 MG tablet Take 20 mg by mouth every evening.    . terazosin (HYTRIN) 5 MG capsule  Take 10 mg by mouth at bedtime.     . vitamin C (ASCORBIC ACID) 500 MG tablet Take 1,000 mg by mouth daily.      No current facility-administered medications for this visit.     Past Surgical History  Procedure Laterality Date  . Cholecystectomy    . Tonsillectomy    . Colonoscopy  2006    Dr. Ward Givens mucosa throughout colon, moderate internal hemorrhoids. bx= benign colonic mucosa  . Ankle fracture surgery      left  . Colonoscopy  12/03/2011    Dr. Gala Romney- colonic diverticulosis, radiation-induced proctitis- s/p APC ablation, no microscopic colitis on bx  . Esophagogastroduodenoscopy  2006    Dr. Oletta Lamas- small hiatal hernia and slightly reddened distal esophagus o/w normal  . Ileocolonoscopy  12/03/2011    [id number] diverticulosis. Radiation-induced proctitis status post APC ablation. Status post segmental colon Biopsy  . Flexible sigmoidoscopy N/A 07/13/2013    [id number] changes of the rectum with active oozing consistent with chronic radiation proctitis.  Otherwise, negative sigmoidoscopy to  40 cm.Status post argon plasma coagulation ablation   . Hot hemostasis N/A 07/13/2013    Procedure: HOT HEMOSTASIS (ARGON PLASMA COAGULATION/BICAP);  Surgeon: Daneil Dolin, MD;  Location: [facility];  Service: Endoscopy;  Laterality: N/A;  . Cataract extraction w/phaco Right 12/19/2013    Procedure: CATARACT EXTRACTION PHACO AND INTRAOCULAR LENS PLACEMENT RIGHT EYE CDE=14.78;  Surgeon: Tonny , MD;  Location: [facility];  Service: Ophthalmology;  Laterality: Right;     Allergies  Allergen Reactions  . Norvasc [Amlodipine Besylate] Swelling    Ankle swelling  . Codeine Nausea And Vomiting      Family History  Problem Relation Age of Onset  . Heart failure Mother   . Heart attack Father   . Heart failure Brother     bladder cancer (agent orange exposure)  . Cirrhosis Brother     chronic viral hepatitis  . Colon cancer Neg Hx      Social History [name] reports that he quit smoking about 17 years ago. He has never used smokeless tobacco. [name] reports that he does not drink alcohol.   Review of Systems CONSTITUTIONAL: No weight loss, fever, chills, weakness or fatigue.  HEENT: Eyes: No visual loss, blurred vision, double vision or yellow sclerae.No hearing loss, sneezing, congestion, runny nose or sore throat.  SKIN: No rash or itching.  CARDIOVASCULAR: per HPI RESPIRATORY: No shortness of breath, cough or sputum.  GASTROINTESTINAL: No anorexia, nausea, vomiting or diarrhea. No abdominal pain or blood.  GENITOURINARY: No burning on urination, no polyuria NEUROLOGICAL: No headache, dizziness, syncope, paralysis, ataxia, numbness or tingling in the extremities. No change in bowel or bladder control.  MUSCULOSKELETAL: No muscle, back pain, joint pain or stiffness.  LYMPHATICS: No enlarged nodes. No history of splenectomy.  PSYCHIATRIC: No history of depression or anxiety.  ENDOCRINOLOGIC: No reports of sweating, cold or heat intolerance. No polyuria or polydipsia.    Marland Kitchen   Physical Examination Filed Vitals:   08/10/15 1126  BP: 142/76  Pulse: 122   Filed Vitals:   08/10/15 1126  Height: 5\' 5"  (1.651 m)  Weight: 181 lb (82.101 kg)    Gen: resting comfortably, no acute distress HEENT: no scleral icterus, pupils equal round and reactive, no palptable cervical adenopathy,  CV: RRR, no m/r/g, no jvd Resp: Clear to auscultation bilaterally GI: abdomen is soft, non-tender, non-distended, normal bowel sounds, no hepatosplenomegaly MSK: extremities are warm, no edema.  Skin: warm,  no rash Neuro:  no focal deficits Psych: appropriate affect   Diagnostic Studies 07/2012 Echo LVEF 55-60%, no WMA, mild-mod AS (mean grad 11, AVA by planimetry 1.2, continuity area not listed)  07/2014 echo Study Conclusions  - Left ventricle: The cavity size was normal. Wall thickness was increased in a pattern of mild LVH. Systolic function was normal. The estimated ejection fraction was in the range of 60% to 65%. Wall motion was normal; there were no regional wall motion abnormalities. Doppler parameters are consistent with abnormal left ventricular relaxation (grade 1 diastolic dysfunction). - Ventricular septum: Septal motion showed abnormal function and dyssynergy. - Aortic valve: Moderately calcified annulus. Trileaflet; moderately calcified leaflets. Cusp separation was moderately reduced. There was moderate stenosis. There was mild regurgitation. Mean gradient (S): 18 mm Hg. Peak gradient (S): 32 mm Hg. VTI ratio of LVOT to aortic valve: 0.43. Valve area (VTI): 1.25 cm^2. Planimetry valve area 1.5 cm2. - Mitral valve: Moderately calcified annulus. There was trivial regurgitation. - Left atrium: The atrium was mildly dilated. - Right atrium: Central venous pressure (est): 3 mm Hg. - Atrial septum: No defect or patent foramen ovale was identified. - Tricuspid valve: There was trivial regurgitation. - Pulmonary arteries: Systolic  pressure could not be accurately estimated. - Pericardium, extracardiac: There was no pericardial effusion.    Assessment and Plan  1. HTN - at goal by home numbers, continue current meds  2. Hyperlipidemia - request most recent panel from pcp - we will continue current statin  3. Aortic stenosis - mild to moderate AS, conitnue to monitor. Repeat echo next year  4. 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 Has some component of anxiety at our appointments, no symptoms. No further workup at this time.  F/u 1 year      Arnoldo Lenis, M.D.

## 2015-08-10 NOTE — Patient Instructions (Signed)
Your physician wants you to follow-up in: 1 year You will receive a reminder letter in the mail two months in advance. If you don't receive a letter, please call our office to schedule the follow-up appointment.   Your physician recommends that you continue on your current medications as directed. Please refer to the Current Medication list given to you today.    If you need a refill on your cardiac medications before your next appointment, please call your pharmacy.      Thank you for choosing [facility] !        

## 2016-01-14 ENCOUNTER — Encounter: Payer: Self-pay | Admitting: Cardiology

## 2016-07-18 ENCOUNTER — Encounter: Payer: Self-pay | Admitting: Internal Medicine

## 2016-07-18 ENCOUNTER — Ambulatory Visit (INDEPENDENT_AMBULATORY_CARE_PROVIDER_SITE_OTHER): Payer: Medicare Other | Admitting: Internal Medicine

## 2016-07-18 VITALS — BP 137/81 | HR 92 | Temp 97.9°F | Ht 66.0 in | Wt 177.6 lb

## 2016-07-18 DIAGNOSIS — R197 Diarrhea, unspecified: Secondary | ICD-10-CM

## 2016-07-18 NOTE — Progress Notes (Signed)
Primary Care Physician:  Kendry Pfarr Bellow, MD Primary Gastroenterologist:  Dr. Gala Romney  Pre-Procedure History & Physical: HPI:  Calvin Moreno is a 76 y.o. male here for further evaluation of recurrent diarrhea and rectal bleeding. History of radiation proctitis treated with APC 2015. Long history of IBS-D, with possibly an element of post cholecystectomy diarrhea not excluded. Patient states he has recurrent intermittent, watery diarrhea bouts of incontinence.   Previously, combination of Metamucil and Questran improve the symptoms considerably. Along the way, he came off those agents. He brings me a pamphlet in today talking about pancreatic exocrine insufficiency. He notes he's had greasy stools from time to time-particularly malodorous and hard to flush.  Colon biopsies previously negative for microscopic colitis.  Past Medical History:  Diagnosis Date  . Aortic stenosis, mild 2000   And insufficiency, evaluation by Dr. Verl Blalock in 2000  . Arthritis   . Benign prostatic hypertrophy    PSA of 1.39 in 09/2010  . Chronic kidney disease    Borderline; creatinine of 1.6 in 08/2009, but 1.15 in 05/2010  . Chronic sinusitis    By MRI  . Diverticulosis 2013  . Hearing impaired   . Hemorrhoids 2006  . Hiatal hernia 2006  . Hyperlipidemia    Elevated triglycerides  . Hypertension    Mild internal carotid artery plaque on MRI/MRA in 2002  . IBS (irritable bowel syndrome)   . Insomnia   . Left bundle branch block 2012   2012  . Metabolic syndrome    Fasting hyperglycemia  . Murmur   . Nephrolithiasis 08/2009   2011  . Obesity   . Proctitis    radiation induced  . Prostate cancer ([place]) 11/2010   s/p XRT  . Seasonal allergies   . Stroke [facility])    TIA, no residual, seen on MRI  . 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 Uses hearing aid    bilateral    Past Surgical History:  Procedure Laterality Date  . ANKLE FRACTURE SURGERY     left  . CATARACT  EXTRACTION W/PHACO Right 12/19/2013   Procedure: CATARACT EXTRACTION PHACO AND INTRAOCULAR LENS PLACEMENT RIGHT EYE CDE=14.78;  Surgeon: Tonny Branch, MD;  Location: [facility];  Service: Ophthalmology;  Laterality: Right;  . CHOLECYSTECTOMY    . COLONOSCOPY  2006   Dr. Ward Givens mucosa throughout colon, moderate internal hemorrhoids. bx= benign colonic mucosa  . COLONOSCOPY  12/03/2011   Dr. Gala Romney- colonic diverticulosis, radiation-induced proctitis- s/p APC ablation, no microscopic colitis on bx  . ESOPHAGOGASTRODUODENOSCOPY  2006   Dr. Oletta Lamas- small hiatal hernia and slightly reddened distal esophagus o/w normal  . FLEXIBLE SIGMOIDOSCOPY N/A 07/13/2013   [id number] changes of the rectum with active oozing consistent with chronic radiation proctitis.  Otherwise, negative sigmoidoscopy to 40 cm.Status post argon plasma coagulation ablation   . HOT HEMOSTASIS N/A 07/13/2013   Procedure: HOT HEMOSTASIS (ARGON PLASMA COAGULATION/BICAP);  Surgeon: Daneil Dolin, MD;  Location: [facility];  Service: Endoscopy;  Laterality: N/A;  . Ileocolonoscopy  12/03/2011   [id number] diverticulosis. Radiation-induced proctitis status post APC ablation. Status post segmental colon Biopsy  . TONSILLECTOMY      Prior to Admission medications   Medication Sig Start Date End Date Taking? Authorizing Provider  acyclovir (ZOVIRAX) 800 MG tablet Take 800 mg by mouth 3 (three) times daily.    Yes Historical Provider, MD  ALPRAZolam Duanne Moron) 0.5 MG tablet Take 0.5 mg  by mouth at bedtime as needed. For sleep   Yes Historical Provider, MD  aspirin EC 81 MG tablet Take 81 mg by mouth daily.   Yes Historical Provider, MD  cholecalciferol (VITAMIN D) 1000 UNITS tablet Take 1,000 Units by mouth daily.     Yes Historical Provider, MD  fexofenadine (ALLEGRA) 180 MG tablet Take 180 mg by mouth daily as needed. For allergies   Yes Historical Provider, MD  glycopyrrolate (ROBINUL) 2 MG tablet Take 2 mg by mouth 2 (two)  times daily as needed (IBS).    Yes Historical Provider, MD  hydrochlorothiazide (HYDRODIURIL) 25 MG tablet Take 0.5 tablets (12.5 mg total) by mouth daily. 09/19/14  Yes Arnoldo Lenis, MD  meclizine (ANTIVERT) 25 MG tablet Take 25 mg by mouth at bedtime.    Yes Historical Provider, MD  Melatonin 3 MG TABS Take 1 tablet by mouth at bedtime. For sleep   Yes Historical Provider, MD  Omega-3 Fatty Acids (OMEGA-3 FISH OIL) 1200 MG CAPS Take 1 capsule by mouth daily.     Yes Historical Provider, MD  simvastatin (ZOCOR) 20 MG tablet Take 20 mg by mouth every evening.   Yes Historical Provider, MD  terazosin (HYTRIN) 5 MG capsule Take 10 mg by mouth at bedtime.    Yes Historical Provider, MD  vitamin C (ASCORBIC ACID) 500 MG tablet Take 1,000 mg by mouth daily.    Yes Historical Provider, MD    Allergies as of 07/18/2016 - Review Complete 07/18/2016  Allergen Reaction Noted  . Norvasc [amlodipine besylate] Swelling 09/19/2014  . Codeine Nausea And Vomiting 11/06/2011    Family History  Problem Relation Age of Onset  . Heart failure Mother   . Heart attack Father   . Heart failure Brother     bladder cancer (agent orange exposure)  . Cirrhosis Brother     chronic viral hepatitis  . Colon cancer Neg Hx     Social History   Social History  . Marital status: Married    Spouse name: N/A  . Number of children: 2  . Years of education: N/A   Occupational History  . Retired [employer]   Social History Main Topics  . Smoking status: Former Smoker    Packs/day: 1.50    Years: 40.00    Quit date: 11/04/1997  . Smokeless tobacco: Never Used     Comment: Quit in 1999  . Alcohol use No  . Drug use: No  . Sexual activity: Not on file   Other Topics Concern  . Not on file   Social History Narrative   Married with 2 children   Lives locally   No regular exercise    Review of Systems: See HPI, otherwise negative ROS  Physical Exam: BP 137/81   Pulse 92   Temp 97.9 F  (36.6 C) (Oral)   Ht 5\' 6"  (1.676 m)   Wt 177 lb 9.6 oz (80.6 kg)   BMI 28.67 kg/m  General:   Alert,   pleasant and cooperative in NAD Neck:  Supple; no masses or thyromegaly. No significant cervical adenopathy. Lungs:  Clear throughout to auscultation.   No wheezes, crackles, or rhonchi. No acute distress. Heart:  Regular rate and rhythm; no murmurs, clicks, rubs,  or gallops. Abdomen: Non-distended, normal bowel sounds.  Soft and nontender without appreciable mass or hepatosplenomegaly.  Pulses:  Normal pulses noted. Extremities:  Without clubbing or edema.  Impression:  76 year old gentleman with recurrent intermittent diarrhea with  incontinence and  paper hematochezia as well. 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 Some symptoms including particularly malodorous, greasy stools is an interesting observation.  Recent antibiotics for staph infection involving his lower extremity. It's been 5 years since he had his entire colon looked at.   Recommendations:  Stool for Cdiff testing  If negative; 2 week trial of Creon  I have recommended a diagnostic colonoscopy in the near future.   The risks, benefits, limitations, alternatives and imponderables have been reviewed with the patient. Questions have been answered. Colonoscopy recommended but patient not interested in having one at this time.  This was discussed. He seems to understand the risks.  Further recommendations to follow        Notice: This dictation was prepared with Dragon dictation along with smaller phrase technology. Any transcriptional errors that result from this process are unintentional and may not be corrected upon review.

## 2016-07-18 NOTE — Patient Instructions (Signed)
Stool for Cdiff testing  If negative; 2 week trial of Creon  Colonoscopy recommended but will hold off for now at your request  Further recommendations to follow

## 2016-07-22 LAB — CLOSTRIDIUM DIFFICILE BY PCR: Toxigenic C. Difficile by PCR: NOT DETECTED

## 2016-08-05 ENCOUNTER — Encounter: Payer: Self-pay | Admitting: Cardiology

## 2016-08-05 ENCOUNTER — Ambulatory Visit (INDEPENDENT_AMBULATORY_CARE_PROVIDER_SITE_OTHER): Payer: Medicare Other | Admitting: Cardiology

## 2016-08-05 VITALS — BP 120/68 | HR 109 | Ht 66.0 in | Wt 181.0 lb

## 2016-08-05 DIAGNOSIS — R000 Tachycardia, unspecified: Secondary | ICD-10-CM

## 2016-08-05 DIAGNOSIS — E782 Mixed hyperlipidemia: Secondary | ICD-10-CM | POA: Diagnosis not present

## 2016-08-05 DIAGNOSIS — I350 Nonrheumatic aortic (valve) stenosis: Secondary | ICD-10-CM

## 2016-08-05 DIAGNOSIS — I10 Essential (primary) hypertension: Secondary | ICD-10-CM | POA: Diagnosis not present

## 2016-08-05 NOTE — Progress Notes (Signed)
Clinical Summary Calvin Moreno is a 76 y.o.male seen today for follow up of the following medical problems.   1. HTN - compliant with meds. Home bp's typically are around 130s/70s  2. Hyperlipidemia - regular labs with pcp - reports side effects on other statins in the past. Has done well on simva 20.   3. Aortic stenosis - mild to moderate AS by echo 07/2014 with mean grad 18, AVA 1.25.   - No SOB, no chest pain, or syncope.   4. Sinus tachcyardia Patient with frequent elevated heart rates in clinic. Checks heart rates regularly at home, typically in low 90s. Often has anxiety in the clinic - no recent palpitations.    5. Chronic LBBB  Past Medical History:  Diagnosis Date  . Aortic stenosis, mild 2000   And insufficiency, evaluation by Dr. Verl Blalock in 2000  . Arthritis   . Benign prostatic hypertrophy    PSA of 1.39 in 09/2010  . Chronic kidney disease    Borderline; creatinine of 1.6 in 08/2009, but 1.15 in 05/2010  . Chronic sinusitis    By MRI  . Diverticulosis 2013  . Hearing impaired   . Hemorrhoids 2006  . Hiatal hernia 2006  . Hyperlipidemia    Elevated triglycerides  . Hypertension    Mild internal carotid artery plaque on MRI/MRA in 2002  . IBS (irritable bowel syndrome)   . Insomnia   . Left bundle Edythe Riches block 2012   2012  . Metabolic syndrome    Fasting hyperglycemia  . Murmur   . Nephrolithiasis 08/2009   2011  . Obesity   . Proctitis    radiation induced  . Prostate cancer ([place]) 11/2010   s/p XRT  . Seasonal allergies   . Stroke [facility])    TIA, no residual, seen on MRI  . 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 Uses hearing aid    bilateral     Allergies  Allergen Reactions  . Norvasc [Amlodipine Besylate] Swelling    Ankle swelling  . Codeine Nausea And Vomiting     Current Outpatient Prescriptions  Medication Sig Dispense Refill  . acyclovir (ZOVIRAX) 800 MG tablet Take 800 mg by mouth 3 (three) times  daily.     Marland Kitchen ALPRAZolam (XANAX) 0.5 MG tablet Take 0.5 mg by mouth at bedtime as needed. For sleep    . aspirin EC 81 MG tablet Take 81 mg by mouth daily.    . cholecalciferol (VITAMIN D) 1000 UNITS tablet Take 1,000 Units by mouth daily.      . fexofenadine (ALLEGRA) 180 MG tablet Take 180 mg by mouth daily as needed. For allergies    . glycopyrrolate (ROBINUL) 2 MG tablet Take 2 mg by mouth 2 (two) times daily as needed (IBS).     . hydrochlorothiazide (HYDRODIURIL) 25 MG tablet Take 0.5 tablets (12.5 mg total) by mouth daily. 45 tablet 0  . meclizine (ANTIVERT) 25 MG tablet Take 25 mg by mouth at bedtime.     . Melatonin 3 MG TABS Take 1 tablet by mouth at bedtime. For sleep    . Omega-3 Fatty Acids (OMEGA-3 FISH OIL) 1200 MG CAPS Take 1 capsule by mouth daily.      . simvastatin (ZOCOR) 20 MG tablet Take 20 mg by mouth every evening.    . terazosin (HYTRIN) 5 MG capsule Take 10 mg by mouth at bedtime.     Marland Kitchen  vitamin C (ASCORBIC ACID) 500 MG tablet Take 1,000 mg by mouth daily.      No current facility-administered medications for this visit.      Past Surgical History:  Procedure Laterality Date  . ANKLE FRACTURE SURGERY     left  . CATARACT EXTRACTION W/PHACO Right 12/19/2013   Procedure: CATARACT EXTRACTION PHACO AND INTRAOCULAR LENS PLACEMENT RIGHT EYE CDE=14.78;  Surgeon: Tonny , MD;  Location: [facility];  Service: Ophthalmology;  Laterality: Right;  . CHOLECYSTECTOMY    . COLONOSCOPY  2006   Dr. Ward Givens mucosa throughout colon, moderate internal hemorrhoids. bx= benign colonic mucosa  . COLONOSCOPY  12/03/2011   Dr. Gala Romney- colonic diverticulosis, radiation-induced proctitis- s/p APC ablation, no microscopic colitis on bx  . ESOPHAGOGASTRODUODENOSCOPY  2006   Dr. Oletta Lamas- small hiatal hernia and slightly reddened distal esophagus o/w normal  . FLEXIBLE SIGMOIDOSCOPY N/A 07/13/2013   [id number] changes of the rectum with active oozing consistent with chronic radiation  proctitis.  Otherwise, negative sigmoidoscopy to 40 cm.Status post argon plasma coagulation ablation   . HOT HEMOSTASIS N/A 07/13/2013   Procedure: HOT HEMOSTASIS (ARGON PLASMA COAGULATION/BICAP);  Surgeon: Daneil Dolin, MD;  Location: [facility];  Service: Endoscopy;  Laterality: N/A;  . Ileocolonoscopy  12/03/2011   [id number] diverticulosis. Radiation-induced proctitis status post APC ablation. Status post segmental colon Biopsy  . TONSILLECTOMY       Allergies  Allergen Reactions  . Norvasc [Amlodipine Besylate] Swelling    Ankle swelling  . Codeine Nausea And Vomiting      Family History  Problem Relation Age of Onset  . Heart failure Mother   . Heart attack Father   . Heart failure Brother     bladder cancer (agent orange exposure)  . Cirrhosis Brother     chronic viral hepatitis  . Colon cancer Neg Hx      Social History Calvin Moreno reports that he quit smoking about 18 years ago. He has a 60.00 pack-year smoking history. He has never used smokeless tobacco. Calvin Moreno reports that he does not drink alcohol.   Review of Systems CONSTITUTIONAL: No weight loss, fever, chills, weakness or fatigue.  HEENT: Eyes: No visual loss, blurred vision, double vision or yellow sclerae.No hearing loss, sneezing, congestion, runny nose or sore throat.  SKIN: No rash or itching.  CARDIOVASCULAR: per hpi RESPIRATORY: No shortness of breath, cough or sputum.  GASTROINTESTINAL: No anorexia, nausea, vomiting or diarrhea. No abdominal pain or blood.  GENITOURINARY: No burning on urination, no polyuria NEUROLOGICAL: No headache, dizziness, syncope, paralysis, ataxia, numbness or tingling in the extremities. No change in bowel or bladder control.  MUSCULOSKELETAL: No muscle, back pain, joint pain or stiffness.  LYMPHATICS: No enlarged nodes. No history of splenectomy.  PSYCHIATRIC: No history of depression or anxiety.  ENDOCRINOLOGIC: No reports of sweating, cold or heat  intolerance. No polyuria or polydipsia.  Marland Kitchen   Physical Examination Vitals:   08/05/16 1416  BP: 120/68  Pulse: (!) 109   Vitals:   08/05/16 1416  Weight: 181 lb (82.1 kg)  Height: 5\' 6"  (1.676 m)    Gen: resting comfortably, no acute distress HEENT: no scleral icterus, pupils equal round and reactive, no palptable cervical adenopathy,  CV: RRR, 3/6 systolic murmur RUSB, no jvd Resp: Clear to auscultation bilaterally GI: abdomen is soft, non-tender, non-distended, normal bowel sounds, no hepatosplenomegaly MSK: extremities are warm, no edema.  Skin: warm, no rash Neuro:  no focal deficits Psych: appropriate affect  Diagnostic Studies 07/2012 Echo LVEF 55-60%, no WMA, mild-mod AS (mean grad 11, AVA by planimetry 1.2, continuity area not listed)  07/2014 echo Study Conclusions  - Left ventricle: The cavity size was normal. Wall thickness was increased in a pattern of mild LVH. Systolic function was normal. The estimated ejection fraction was in the range of 60% to 65%. Wall motion was normal; there were no regional wall motion abnormalities. Doppler parameters are consistent with abnormal left ventricular relaxation (grade 1 diastolic dysfunction). - Ventricular septum: Septal motion showed abnormal function and dyssynergy. - Aortic valve: Moderately calcified annulus. Trileaflet; moderately calcified leaflets. Cusp separation was moderately reduced. There was moderate stenosis. There was mild regurgitation. Mean gradient (S): 18 mm Hg. Peak gradient (S): 32 mm Hg. VTI ratio of LVOT to aortic valve: 0.43. Valve area (VTI): 1.25 cm^2. Planimetry valve area 1.5 cm2. - Mitral valve: Moderately calcified annulus. There was trivial regurgitation. - Left atrium: The atrium was mildly dilated. - Right atrium: Central venous pressure (est): 3 mm Hg. - Atrial septum: No defect or patent foramen ovale was identified. - Tricuspid valve: There was trivial  regurgitation. - Pulmonary arteries: Systolic pressure could not be accurately estimated. - Pericardium, extracardiac: There was no pericardial effusion.    Assessment and Plan  1. HTN - bp is at goal, continue current meds  2. Hyperlipidemia - we will continue current statin. Request labs from pcp  3. Aortic stenosis -mild to moderate AS by last echo. We will repeat study to evaluate for progression.   4. Sinus tachcardia - often anxious in clinic with high heart rates. Home heart rates WNL. EKG in clinic shows sinus tach chronic LBBB - continue to monitor.  F/u 1 year        Arnoldo Lenis, M.D

## 2016-08-05 NOTE — Patient Instructions (Signed)
Medication Instructions:  Your physician recommends that you continue on your current medications as directed. Please refer to the Current Medication list given to you today.   Labwork: I WILL REQUEST A COPY OF LAB WORK FROM PCP  Testing/Procedures: Your physician has requested that you have an echocardiogram. Echocardiography is a painless test that uses sound waves to create images of your heart. It provides your doctor with information about the size and shape of your heart and how well your heart's chambers and valves are working. This procedure takes approximately one hour. There are no restrictions for this procedure.   Follow-Up: Your physician wants you to follow-up in: 1 YEAR.  You will receive a reminder letter in the mail two months in advance. If you don't receive a letter, please call our office to schedule the follow-up appointment.   Any Other Special Instructions Will Be Listed Below (If Applicable).     If you need a refill on your cardiac medications before your next appointment, please call your pharmacy.

## 2016-08-07 ENCOUNTER — Telehealth: Payer: Self-pay | Admitting: Internal Medicine

## 2016-08-07 NOTE — Telephone Encounter (Signed)
Pt called to say that he has been using the Creon samples and they seem to be helping. Pt is asking for more samples of Creon. He said if he needed a prescription that if it was short term he uses [place] and if it was long term he uses the mail order with [employer]. Please advise. [phone]

## 2016-08-11 NOTE — Telephone Encounter (Signed)
3 with meals and 2 with snacks  Disp 300 w 11 refills

## 2016-08-11 NOTE — Telephone Encounter (Signed)
Dr.Rourk, we are out of creon samples. Do you want to send in rx? Will he be on it long term?

## 2016-08-12 ENCOUNTER — Ambulatory Visit (HOSPITAL_COMMUNITY)
Admission: RE | Admit: 2016-08-12 | Discharge: 2016-08-12 | Disposition: A | Discharge status: Home / Self Care | Payer: Medicare Other | Source: Ambulatory Visit | Attending: Cardiology | Admitting: Cardiology

## 2016-08-12 DIAGNOSIS — I083 Combined rheumatic disorders of mitral, aortic and tricuspid valves: Secondary | ICD-10-CM | POA: Insufficient documentation

## 2016-08-12 DIAGNOSIS — I350 Nonrheumatic aortic (valve) stenosis: Secondary | ICD-10-CM | POA: Diagnosis not present

## 2016-08-12 DIAGNOSIS — I700 Atherosclerosis of aorta: Secondary | ICD-10-CM | POA: Diagnosis not present

## 2016-08-12 LAB — ECHOCARDIOGRAM COMPLETE
AO mean calculated velocity dopler: 188 cm/s
AOVTI: 56.4 cm
AV Peak grad: 36 mmHg
AV VEL mean LVOT/AV: 0.4
AV peak Index: 0.38
AV pk vel: 299 cm/s
AVAREAMEANV: 0.8 cm2
AVAREAMEANVIN: 0.4 cm2/m2
AVAREAVTI: 0.76 cm2
AVAREAVTIIND: 0.39 cm2/m2
AVG: 18 mmHg
AVLVOTPG: 5 mmHg
AVPHT: 535 ms
Ao pk vel: 0.38 m/s
CHL CUP AV VEL: 0.77
CHL CUP STROKE VOLUME: 28 mL
EWDT: 201 ms
FS: 35 % (ref 28–44)
IV/PV OW: 1.03
LA diam end sys: 36 mm
LA diam index: 1.82 cm/m2
LA vol A4C: 40.3 ml
LA vol index: 20.6 mL/m2
LA vol: 40.8 mL
LASIZE: 36 mm
LDCA: 2.01 cm2
LVDIAVOL: 47 mL — AB (ref 62–150)
LVDIAVOLIN: 24 mL/m2
LVOT VTI: 21.5 cm
LVOT diameter: 16 mm
LVOT peak VTI: 0.38 cm
LVOTPV: 113 cm/s
LVOTSV: 43 mL
LVSYSVOL: 19 mL — AB (ref 21–61)
LVSYSVOLIN: 10 mL/m2
MV Dec: 201
MV Peak grad: 12 mmHg
MV pk E vel: 175 m/s
PW: 10.9 mm — AB (ref 0.6–1.1)
RV LATERAL S' VELOCITY: 12.4 cm/s
RV TAPSE: 12.1 mm
Simpson's disk: 60
Valve area index: 0.39
Valve area: 0.77 cm2

## 2016-08-12 MED ORDER — PANCRELIPASE (LIP-PROT-AMYL) 36000-114000 UNITS PO CPEP: ORAL_CAPSULE | ORAL | 11 refills | Status: DC

## 2016-08-12 NOTE — Telephone Encounter (Signed)
rx sent to the pharmacy. Pt is aware.

## 2016-08-12 NOTE — Progress Notes (Signed)
*  PRELIMINARY RESULTS* Echocardiogram 2D Echocardiogram has been performed.  Samuel Germany 08/12/2016, 3:56 PM

## 2016-08-12 NOTE — Addendum Note (Signed)
Addended by: Claudina Lick on: 08/12/2016 11:10 AM   Modules accepted: Orders

## 2016-09-09 ENCOUNTER — Other Ambulatory Visit (HOSPITAL_COMMUNITY): Payer: Self-pay | Admitting: Family Medicine

## 2016-09-09 DIAGNOSIS — I729 Aneurysm of unspecified site: Secondary | ICD-10-CM

## 2016-09-09 DIAGNOSIS — I10 Essential (primary) hypertension: Secondary | ICD-10-CM

## 2016-09-09 DIAGNOSIS — Z87891 Personal history of nicotine dependence: Secondary | ICD-10-CM

## 2016-09-09 DIAGNOSIS — Z136 Encounter for screening for cardiovascular disorders: Secondary | ICD-10-CM

## 2016-09-19 ENCOUNTER — Telehealth: Payer: Self-pay

## 2016-09-19 NOTE — Telephone Encounter (Signed)
Pt called- creon costs $2000.00 and has put him the doughnut hole. He cannot afford to keep taking it. He cannot use a copay card. I checked his formulary and Creon is on the cheapest tier.  I spoke with the creon drug rep, he advised pt try to get medicare prescription assistance and if denied for that, he may qualify for creon patient assistance. Drug rep gave me a card for 50 free Creon to help the pt while we are trying to get assistance. I need an rx for #50 creon.   Dr.Rourk is it ok for me to give the pt a rx for #50 creon to go with this card?

## 2016-09-19 NOTE — Telephone Encounter (Signed)
yes

## 2016-09-22 ENCOUNTER — Other Ambulatory Visit (HOSPITAL_COMMUNITY): Payer: Self-pay | Admitting: Family Medicine

## 2016-09-22 DIAGNOSIS — Z87891 Personal history of nicotine dependence: Secondary | ICD-10-CM

## 2016-09-22 NOTE — Telephone Encounter (Signed)
Pt came by the office, I gave him the rx and coupon and all the paperwork and explained everything to him. He stated he understood and would get to work on it right away and will bring paperwork back to me asap. He is also going to call his insurance co.

## 2016-09-22 NOTE — Telephone Encounter (Signed)
I have rx for #50 creon with coupon, LIS program information and patient assistance information ready for the pt. I tried to call him- [id number], asked him to either call me or to come to the office after 2pm and that I have information for him.

## 2016-09-26 ENCOUNTER — Encounter: Payer: Self-pay | Admitting: Cardiology

## 2016-09-26 ENCOUNTER — Ambulatory Visit (INDEPENDENT_AMBULATORY_CARE_PROVIDER_SITE_OTHER): Payer: Medicare Other | Admitting: Cardiology

## 2016-09-26 VITALS — BP 126/76 | HR 98 | Ht 66.0 in | Wt 177.0 lb

## 2016-09-26 DIAGNOSIS — I350 Nonrheumatic aortic (valve) stenosis: Secondary | ICD-10-CM

## 2016-09-26 NOTE — Patient Instructions (Signed)
Medication Instructions:  Your physician recommends that you continue on your current medications as directed. Please refer to the Current Medication list given to you today.   Labwork: NONE  Testing/Procedures: NONE  Follow-Up: Your physician recommends that you schedule a follow-up appointment in: 3 MONTHS    Any Other Special Instructions Will Be Listed Below (If Applicable).     If you need a refill on your cardiac medications before your next appointment, please call your pharmacy.   

## 2016-09-26 NOTE — Progress Notes (Signed)
Clinical Summary Calvin Moreno is a 76 y.o.male seen today for follow up of the following medical problems. This is a focused visit on his history of aortic stenosis.   1. Aortic stenosis - contacted by pcp to reevaluate for SOB.  - mild to moderate AS by echo 07/2014 with mean grad 18, AVA 1.25.  - No SOB, no chest pain, or syncope - note LVOT of 19 used for 2016 study, LVOT of 16 used in 2018 study - no chest pain - - reports severe sedentary lifestyle. Highest level of activity is working in garden. Can use ho or shovel 15-20 minutes, then has to rest. Though also limited by back pain. Mildy worst frome baseline. - no coughing or wheezing.         Past Medical History:  Diagnosis Date  . Aortic stenosis, mild 2000   And insufficiency, evaluation by Dr. Verl Blalock in 2000  . Arthritis   . Benign prostatic hypertrophy    PSA of 1.39 in 09/2010  . Chronic kidney disease    Borderline; creatinine of 1.6 in 08/2009, but 1.15 in 05/2010  . Chronic sinusitis    By MRI  . Diverticulosis 2013  . Hearing impaired   . Hemorrhoids 2006  . Hiatal hernia 2006  . Hyperlipidemia    Elevated triglycerides  . Hypertension    Mild internal carotid artery plaque on MRI/MRA in 2002  . IBS (irritable bowel syndrome)   . Insomnia   . Left bundle [place] block 2012   2012  . Metabolic syndrome    Fasting hyperglycemia  . Murmur   . Nephrolithiasis 08/2009   2011  . Obesity   . Proctitis    radiation induced  . Prostate cancer ([place]) 11/2010   s/p XRT  . Seasonal allergies   . Stroke [facility])    TIA, no residual, seen on MRI  . 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 Uses hearing aid    bilateral     Allergies  Allergen Reactions  . Norvasc [Amlodipine Besylate] Swelling    Ankle swelling  . Codeine Nausea And Vomiting     Current Outpatient Prescriptions  Medication Sig Dispense Refill  . acyclovir (ZOVIRAX) 800 MG tablet Take 800 mg by mouth 3 (three)  times daily.     Marland Kitchen ALPRAZolam (XANAX) 0.5 MG tablet Take 0.5 mg by mouth at bedtime as needed. For sleep    . aspirin EC 81 MG tablet Take 81 mg by mouth daily.    . cholecalciferol (VITAMIN D) 1000 UNITS tablet Take 1,000 Units by mouth daily.      . fexofenadine (ALLEGRA) 180 MG tablet Take 180 mg by mouth daily as needed. For allergies    . glycopyrrolate (ROBINUL) 2 MG tablet Take 2 mg by mouth 2 (two) times daily as needed (IBS).     . hydrochlorothiazide (HYDRODIURIL) 25 MG tablet Take 0.5 tablets (12.5 mg total) by mouth daily. 45 tablet 0  . lipase/protease/amylase (CREON) 36000 UNITS CPEP capsule Take 3 with meals and 2 with snacks. 300 capsule 11  . meclizine (ANTIVERT) 25 MG tablet Take 25 mg by mouth at bedtime.     . Melatonin 3 MG TABS Take 1 tablet by mouth at bedtime. For sleep    . Omega-3 Fatty Acids (OMEGA-3 FISH OIL) 1200 MG CAPS Take 1 capsule by mouth daily.      . simvastatin (ZOCOR) 20 MG tablet  Take 20 mg by mouth every evening.    . terazosin (HYTRIN) 5 MG capsule Take 10 mg by mouth at bedtime.     . vitamin C (ASCORBIC ACID) 500 MG tablet Take 1,000 mg by mouth daily.      No current facility-administered medications for this visit.      Past Surgical History:  Procedure Laterality Date  . ANKLE FRACTURE SURGERY     left  . CATARACT EXTRACTION W/PHACO Right 12/19/2013   Procedure: CATARACT EXTRACTION PHACO AND INTRAOCULAR LENS PLACEMENT RIGHT EYE CDE=14.78;  Surgeon: Tonny , MD;  Location: [facility];  Service: Ophthalmology;  Laterality: Right;  . CHOLECYSTECTOMY    . COLONOSCOPY  2006   Dr. Ward Givens mucosa throughout colon, moderate internal hemorrhoids. bx= benign colonic mucosa  . COLONOSCOPY  12/03/2011   Dr. Gala Romney- colonic diverticulosis, radiation-induced proctitis- s/p APC ablation, no microscopic colitis on bx  . ESOPHAGOGASTRODUODENOSCOPY  2006   Dr. Oletta Lamas- small hiatal hernia and slightly reddened distal esophagus o/w normal  . FLEXIBLE  SIGMOIDOSCOPY N/A 07/13/2013   [id number] changes of the rectum with active oozing consistent with chronic radiation proctitis.  Otherwise, negative sigmoidoscopy to 40 cm.Status post argon plasma coagulation ablation   . HOT HEMOSTASIS N/A 07/13/2013   Procedure: HOT HEMOSTASIS (ARGON PLASMA COAGULATION/BICAP);  Surgeon: Daneil Dolin, MD;  Location: [facility];  Service: Endoscopy;  Laterality: N/A;  . Ileocolonoscopy  12/03/2011   [id number] diverticulosis. Radiation-induced proctitis status post APC ablation. Status post segmental colon Biopsy  . TONSILLECTOMY       Allergies  Allergen Reactions  . Norvasc [Amlodipine Besylate] Swelling    Ankle swelling  . Codeine Nausea And Vomiting      Family History  Problem Relation Age of Onset  . Heart failure Mother   . Heart attack Father   . Heart failure Brother     bladder cancer (agent orange exposure)  . Cirrhosis Brother     chronic viral hepatitis  . Colon cancer Neg Hx      Social History Mr. Tener reports that he quit smoking about 18 years ago. He has a 60.00 pack-year smoking history. He has never used smokeless tobacco. Mr. Trautman reports that he does not drink alcohol.   Review of Systems CONSTITUTIONAL: No weight loss, fever, chills, weakness or fatigue.  HEENT: Eyes: No visual loss, blurred vision, double vision or yellow sclerae.No hearing loss, sneezing, congestion, runny nose or sore throat.  SKIN: No rash or itching.  CARDIOVASCULAR: per HPI RESPIRATORY: No shortness of breath, cough or sputum.  GASTROINTESTINAL: No anorexia, nausea, vomiting or diarrhea. No abdominal pain or blood.  GENITOURINARY: No burning on urination, no polyuria NEUROLOGICAL: No headache, dizziness, syncope, paralysis, ataxia, numbness or tingling in the extremities. No change in bowel or bladder control.  MUSCULOSKELETAL: No muscle, back pain, joint pain or stiffness.  LYMPHATICS: No enlarged nodes. No history of  splenectomy.  PSYCHIATRIC: No history of depression or anxiety.  ENDOCRINOLOGIC: No reports of sweating, cold or heat intolerance. No polyuria or polydipsia.  Marland Kitchen   Physical Examination Vitals:   09/26/16 1053  BP: 126/76  Pulse: 98   Vitals:   09/26/16 1053  Weight: 177 lb (80.3 kg)  Height: 5\' 6"  (1.676 m)    Gen: resting comfortably, no acute distress HEENT: no scleral icterus, pupils equal round and reactive, no palptable cervical adenopathy,  CV: RRR, 3/6 systolic murmur rusb, no jvd Resp: Clear to auscultation bilaterally GI: abdomen is soft, non-tender,  non-distended, normal bowel sounds, no hepatosplenomegaly MSK: extremities are warm, no edema.  Skin: warm, no rash Neuro:  no focal deficits Psych: appropriate affect   Diagnostic Studies 07/2012 Echo LVEF 55-60%, no WMA, mild-mod AS (mean grad 11, AVA by planimetry 1.2, continuity area not listed)  07/2014 echo Study Conclusions  - Left ventricle: The cavity size was normal. Wall thickness was increased in a pattern of mild LVH. Systolic function was normal. The estimated ejection fraction was in the range of 60% to 65%. Wall motion was normal; there were no regional wall motion abnormalities. Doppler parameters are consistent with abnormal left ventricular relaxation (grade 1 diastolic dysfunction). - Ventricular septum: Septal motion showed abnormal function and dyssynergy. - Aortic valve: Moderately calcified annulus. Trileaflet; moderately calcified leaflets. Cusp separation was moderately reduced. There was moderate stenosis. There was mild regurgitation. Mean gradient (S): 18 mm Hg. Peak gradient (S): 32 mm Hg. VTI ratio of LVOT to aortic valve: 0.43. Valve area (VTI): 1.25 cm^2. Planimetry valve area 1.5 cm2. - Mitral valve: Moderately calcified annulus. There was trivial regurgitation. - Left atrium: The atrium was mildly dilated. - Right atrium: Central venous pressure (est):  3 mm Hg. - Atrial septum: No defect or patent foramen ovale was identified. - Tricuspid valve: There was trivial regurgitation. - Pulmonary arteries: Systolic pressure could not be accurately estimated. - Pericardium, extracardiac: There was no pericardial effusion.   07/2016 echo Study Conclusions  - Left ventricle: The cavity size was normal. Wall thickness was   increased in a pattern of mild LVH. Systolic function was normal.   The estimated ejection fraction was in the range of 60% to 65%.   Wall motion was normal; there were no regional wall motion   abnormalities. - Aortic valve: Moderately calcified annulus. Moderately calcified   leaflets. There was mild regurgitation. Mean gradient (S): 18 mm   Hg. Peak gradient (S): 36 mm Hg. VTI ratio of LVOT to aortic   valve: 0.38. Valve area (VTI): 0.77 cm^2. Somewhat discordant   information in grading degree of aortic stenosis. Gradients   suggest mild range, however valve appearance and LVOT to AV   ratios are more consistent with moderate to severe aortic   stenosis. - Mitral valve: Moderately calcified annulus. There was trivial   regurgitation. - Right atrium: Central venous pressure (est): 3 mm Hg. - Atrial septum: No defect or patent foramen ovale was identified. - Tricuspid valve: There was trivial regurgitation. - Pulmonary arteries: Systolic pressure could not be accurately   estimated. - Pericardium, extracardiac: A prominent pericardial fat pad was   present.  Impressions:  - Mild LVH with LVEF 60-65%. Grade 2 diastolic dysfunction   suspected. Aortic valve is moderately calcified with at least   moderate calcific aortic stenosis as detailed above, information   is somewhat discordant however. Mild aortic regurgitation.   Moderate mitral annular calcification with trivial mitral   regurgitation. Trivial tricuspid regurgitation.  Assessment and Plan  1. Aortic stenosis - recent symptoms likely due to severe  deconditioning due to sedentary lifestyle - regarding his AV, the consensus of the data supports moderate AS. The AVA VTI is likely underestiamted due to LVOT measurement which was 16 vs 19 from prior study. The mean gradient and dimensionless index support moderate AS - based on data and symptoms do not suspect valve is an issue at this time - we will continue to monitor at this time. Symptoms also not conistent with CAD, do not see indication for  ischemic testing at this time.        Arnoldo Lenis, M.D.

## 2016-09-29 ENCOUNTER — Ambulatory Visit (HOSPITAL_COMMUNITY)
Admission: RE | Admit: 2016-09-29 | Discharge: 2016-09-29 | Disposition: A | Discharge status: Home / Self Care | Payer: Medicare Other | Source: Ambulatory Visit | Attending: Family Medicine | Admitting: Family Medicine

## 2016-09-29 DIAGNOSIS — Z87891 Personal history of nicotine dependence: Secondary | ICD-10-CM | POA: Insufficient documentation

## 2016-09-29 DIAGNOSIS — Z136 Encounter for screening for cardiovascular disorders: Secondary | ICD-10-CM | POA: Insufficient documentation

## 2017-01-12 ENCOUNTER — Encounter: Payer: Self-pay | Admitting: Cardiology

## 2017-01-12 ENCOUNTER — Ambulatory Visit (INDEPENDENT_AMBULATORY_CARE_PROVIDER_SITE_OTHER): Payer: Medicare Other | Admitting: Cardiology

## 2017-01-12 VITALS — BP 130/74 | HR 106 | Ht 65.5 in | Wt 178.0 lb

## 2017-01-12 DIAGNOSIS — I350 Nonrheumatic aortic (valve) stenosis: Secondary | ICD-10-CM

## 2017-01-12 DIAGNOSIS — E782 Mixed hyperlipidemia: Secondary | ICD-10-CM

## 2017-01-12 DIAGNOSIS — I10 Essential (primary) hypertension: Secondary | ICD-10-CM | POA: Diagnosis not present

## 2017-01-12 NOTE — Patient Instructions (Signed)

## 2017-01-12 NOTE — Progress Notes (Signed)
Clinical Summary Calvin Moreno is a 76 y.o.male seen today for follow up of lthe following medical problems.   1. Aortic stenosis - contacted by pcp to reevaluate for SOB.  - mild to moderate AS by echo 07/2014 with mean grad 18, AVA 1.25.  - No SOB, no chest pain, orsyncope - note LVOT of 19 used for 2016 study, LVOT of 16 used in 2018 study   - stable SOB/DOE since last visit. He reports he is mainly limited by lower back pain and leg weakness.   2. HTN -  Home bp's typically are around 140s/70s - compliant with meds  3. Hyperlipidemia - reports side effects on other statins in the past. Has done well on simva 20.  - most recent labs with pcp   4. Sinus tachcyardia Patient with frequent elevated heart rates in clinic. Checks heart rates regularly at home, typically in low 90s. Often has anxiety in the clinic    5. Chronic LBBB   Past Medical History:  Diagnosis Date  . Aortic stenosis, mild 2000   And insufficiency, evaluation by Dr. Verl Blalock in 2000  . Arthritis   . Benign prostatic hypertrophy    PSA of 1.39 in 09/2010  . Chronic kidney disease    Borderline; creatinine of 1.6 in 08/2009, but 1.15 in 05/2010  . Chronic sinusitis    By MRI  . Diverticulosis 2013  . Hearing impaired   . Hemorrhoids 2006  . Hiatal hernia 2006  . Hyperlipidemia    Elevated triglycerides  . Hypertension    Mild internal carotid artery plaque on MRI/MRA in 2002  . IBS (irritable bowel syndrome)   . Insomnia   . Left bundle Calvin Moreno block 2012   2012  . Metabolic syndrome    Fasting hyperglycemia  . Murmur   . Nephrolithiasis 08/2009   2011  . Obesity   . Proctitis    radiation induced  . Prostate cancer ([place]) 11/2010   s/p XRT  . Seasonal allergies   . Stroke [facility])    TIA, no residual, seen on MRI  . 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 Uses hearing aid    bilateral     Allergies  Allergen Reactions  . Norvasc [Amlodipine Besylate]  Swelling    Ankle swelling  . Codeine Nausea And Vomiting     Current Outpatient Prescriptions  Medication Sig Dispense Refill  . acyclovir (ZOVIRAX) 800 MG tablet Take 800 mg by mouth 3 (three) times daily.     Marland Kitchen ALPRAZolam (XANAX) 0.5 MG tablet Take 0.5 mg by mouth at bedtime as needed. For sleep    . aspirin EC 81 MG tablet Take 81 mg by mouth daily.    . cholecalciferol (VITAMIN D) 1000 UNITS tablet Take 1,000 Units by mouth daily.      . fexofenadine (ALLEGRA) 180 MG tablet Take 180 mg by mouth daily as needed. For allergies    . glycopyrrolate (ROBINUL) 2 MG tablet Take 2 mg by mouth 2 (two) times daily as needed (IBS).     . hydrochlorothiazide (HYDRODIURIL) 25 MG tablet Take 0.5 tablets (12.5 mg total) by mouth daily. 45 tablet 0  . lipase/protease/amylase (CREON) 36000 UNITS CPEP capsule Take 3 with meals and 2 with snacks. 300 capsule 11  . meclizine (ANTIVERT) 25 MG tablet Take 25 mg by mouth at bedtime.     . Melatonin 3 MG TABS Take 1 tablet  by mouth at bedtime. For sleep    . Omega-3 Fatty Acids (OMEGA-3 FISH OIL) 1200 MG CAPS Take 1 capsule by mouth daily.      . simvastatin (ZOCOR) 20 MG tablet Take 20 mg by mouth every evening.    . terazosin (HYTRIN) 5 MG capsule Take 10 mg by mouth at bedtime.     . vitamin C (ASCORBIC ACID) 500 MG tablet Take 1,000 mg by mouth daily.      No current facility-administered medications for this visit.      Past Surgical History:  Procedure Laterality Date  . ANKLE FRACTURE SURGERY     left  . CATARACT EXTRACTION W/PHACO Right 12/19/2013   Procedure: CATARACT EXTRACTION PHACO AND INTRAOCULAR LENS PLACEMENT RIGHT EYE CDE=14.78;  Surgeon: Tonny , MD;  Location: [facility]S;  Service: Ophthalmology;  Laterality: Right;  . CHOLECYSTECTOMY    . COLONOSCOPY  2006   Dr. Ward Givens mucosa throughout colon, moderate internal hemorrhoids. bx= benign colonic mucosa  . COLONOSCOPY  12/03/2011   Dr. Gala Romney- colonic diverticulosis,  radiation-induced proctitis- s/p APC ablation, no microscopic colitis on bx  . ESOPHAGOGASTRODUODENOSCOPY  2006   Dr. Oletta Lamas- small hiatal hernia and slightly reddened distal esophagus o/w normal  . FLEXIBLE SIGMOIDOSCOPY N/A 07/13/2013   [id number] changes of the rectum with active oozing consistent with chronic radiation proctitis.  Otherwise, negative sigmoidoscopy to 40 cm.Status post argon plasma coagulation ablation   . HOT HEMOSTASIS N/A 07/13/2013   Procedure: HOT HEMOSTASIS (ARGON PLASMA COAGULATION/BICAP);  Surgeon: Daneil Dolin, MD;  Location: [facility];  Service: Endoscopy;  Laterality: N/A;  . Ileocolonoscopy  12/03/2011   [id number] diverticulosis. Radiation-induced proctitis status post APC ablation. Status post segmental colon Biopsy  . TONSILLECTOMY       Allergies  Allergen Reactions  . Norvasc [Amlodipine Besylate] Swelling    Ankle swelling  . Codeine Nausea And Vomiting      Family History  Problem Relation Age of Onset  . Heart failure Mother   . Heart attack Father   . Heart failure Brother        bladder cancer (agent orange exposure)  . Cirrhosis Brother        chronic viral hepatitis  . Colon cancer Neg Hx      Social History Mr. Rugg reports that he quit smoking about 19 years ago. He has a 60.00 pack-year smoking history. He has never used smokeless tobacco. Mr. Blanke reports that he does not drink alcohol.   Review of Systems CONSTITUTIONAL: No weight loss, fever, chills, weakness or fatigue.  HEENT: Eyes: No visual loss, blurred vision, double vision or yellow sclerae.No hearing loss, sneezing, congestion, runny nose or sore throat.  SKIN: No rash or itching.  CARDIOVASCULAR: per hpi RESPIRATORY: per hpi GASTROINTESTINAL: No anorexia, nausea, vomiting or diarrhea. No abdominal pain or blood.  GENITOURINARY: No burning on urination, no polyuria NEUROLOGICAL: No headache, dizziness, syncope, paralysis, ataxia, numbness or  tingling in the extremities. No change in bowel or bladder control.  MUSCULOSKELETAL: No muscle, back pain, joint pain or stiffness.  LYMPHATICS: No enlarged nodes. No history of splenectomy.  PSYCHIATRIC: No history of depression or anxiety.  ENDOCRINOLOGIC: No reports of sweating, cold or heat intolerance. No polyuria or polydipsia.  Marland Kitchen   Physical Examination Vitals:   01/12/17 1143  BP: 130/74  Pulse: (!) 106  SpO2: 98%   Vitals:   01/12/17 1143  Weight: 178 lb (80.7 kg)  Height: 5' 5.5" (1.664 m)  Gen: resting comfortably, no acute distress HEENT: no scleral icterus, pupils equal round and reactive, no palptable cervical adenopathy,  CV: RRR, 3/6 systolic murmur rusb, no jvd Resp: Clear to auscultation bilaterally GI: abdomen is soft, non-tender, non-distended, normal bowel sounds, no hepatosplenomegaly MSK: extremities are warm, no edema.  Skin: warm, no rash Neuro:  no focal deficits Psych: appropriate affect   Diagnostic Studies 07/2012 Echo LVEF 55-60%, no WMA, mild-mod AS (mean grad 11, AVA by planimetry 1.2, continuity area not listed)  07/2014 echo Study Conclusions  - Left ventricle: The cavity size was normal. Wall thickness was increased in a pattern of mild LVH. Systolic function was normal. The estimated ejection fraction was in the range of 60% to 65%. Wall motion was normal; there were no regional wall motion abnormalities. Doppler parameters are consistent with abnormal left ventricular relaxation (grade 1 diastolic dysfunction). - Ventricular septum: Septal motion showed abnormal function and dyssynergy. - Aortic valve: Moderately calcified annulus. Trileaflet; moderately calcified leaflets. Cusp separation was moderately reduced. There was moderate stenosis. There was mild regurgitation. Mean gradient (S): 18 mm Hg. Peak gradient (S): 32 mm Hg. VTI ratio of LVOT to aortic valve: 0.43. Valve area (VTI): 1.25 cm^2.  Planimetry valve area 1.5 cm2. - Mitral valve: Moderately calcified annulus. There was trivial regurgitation. - Left atrium: The atrium was mildly dilated. - Right atrium: Central venous pressure (est): 3 mm Hg. - Atrial septum: No defect or patent foramen ovale was identified. - Tricuspid valve: There was trivial regurgitation. - Pulmonary arteries: Systolic pressure could not be accurately estimated. - Pericardium, extracardiac: There was no pericardial effusion.   07/2016 echo Study Conclusions  - Left ventricle: The cavity size was normal. Wall thickness was increased in a pattern of mild LVH. Systolic function was normal. The estimated ejection fraction was in the range of 60% to 65%. Wall motion was normal; there were no regional wall motion abnormalities. - Aortic valve: Moderately calcified annulus. Moderately calcified leaflets. There was mild regurgitation. Mean gradient (S): 18 mm Hg. Peak gradient (S): 36 mm Hg. VTI ratio of LVOT to aortic valve: 0.38. Valve area (VTI): 0.77 cm^2. Somewhat discordant information in grading degree of aortic stenosis. Gradients suggest mild range, however valve appearance and LVOT to AV ratios are more consistent with moderate to severe aortic stenosis. - Mitral valve: Moderately calcified annulus. There was trivial regurgitation. - Right atrium: Central venous pressure (est): 3 mm Hg. - Atrial septum: No defect or patent foramen ovale was identified. - Tricuspid valve: There was trivial regurgitation. - Pulmonary arteries: Systolic pressure could not be accurately estimated. - Pericardium, extracardiac: A prominent pericardial fat pad was present.  Impressions:  - Mild LVH with LVEF 60-65%. Grade 2 diastolic dysfunction suspected. Aortic valve is moderately calcified with at least moderate calcific aortic stenosis as detailed above, information is somewhat discordant however. Mild aortic  regurgitation. Moderate mitral annular calcification with trivial mitral regurgitation. Trivial tricuspid regurgitation.    Assessment and Plan  1. Aortic stenosis - recent symptoms likely due to severe deconditioning due to sedentary lifestyle - regarding his AV, the consensus of the data supports moderate AS. The AVA VTI is likely underestiamted due to LVOT measurement which was 16 vs 19 from prior study. The mean gradient and dimensionless index support moderate AS  - we will repeat echo in 6 months.     2. HTN - at goal, continue current meds  3. Hyperlipidemia - continue current statin, request labs from pcp  Arnoldo Lenis, M.D.

## 2017-07-15 ENCOUNTER — Encounter: Payer: Self-pay | Admitting: Cardiology

## 2017-07-15 ENCOUNTER — Ambulatory Visit (INDEPENDENT_AMBULATORY_CARE_PROVIDER_SITE_OTHER): Payer: Medicare Other | Admitting: Cardiology

## 2017-07-15 VITALS — BP 130/76 | HR 87 | Ht 66.0 in | Wt 181.0 lb

## 2017-07-15 DIAGNOSIS — E782 Mixed hyperlipidemia: Secondary | ICD-10-CM

## 2017-07-15 DIAGNOSIS — I10 Essential (primary) hypertension: Secondary | ICD-10-CM | POA: Diagnosis not present

## 2017-07-15 DIAGNOSIS — I350 Nonrheumatic aortic (valve) stenosis: Secondary | ICD-10-CM

## 2017-07-15 NOTE — Patient Instructions (Signed)
Medication Instructions:  Your physician recommends that you continue on your current medications as directed. Please refer to the Current Medication list given to you today.   Labwork: I WILL REQUEST LABS FROM PCP.   Testing/Procedures: Your physician has requested that you have an echocardiogram. Echocardiography is a painless test that uses sound waves to create images of your heart. It provides your doctor with information about the size and shape of your heart and how well your heart's chambers and valves are working. This procedure takes approximately one hour. There are no restrictions for this procedure.    Follow-Up: Your physician wants you to follow-up in: 6 MONTHS .  You will receive a reminder letter in the mail two months in advance. If you don't receive a letter, please call our office to schedule the follow-up appointment.   Any Other Special Instructions Will Be Listed Below (If Applicable).     If you need a refill on your cardiac medications before your next appointment, please call your pharmacy.   

## 2017-07-15 NOTE — Progress Notes (Signed)
Clinical Summary Mr. Lehrke is a 77 y.o.male  seen today for follow up of lthe following medical problems.   1. Aortic stenosis - contacted by pcp to reevaluate for SOB.  - mild to moderate AS by echo 07/2014 with mean grad 18, AVA 1.25.  - No SOB, no chest pain, orsyncope - note LVOT of 19 used for 2016 study, LVOT of 16 used in 2018 study   - stable SOB/DOE since last visit. He reports he is mainly limited by lower back pain and leg weakness.    2. HTN - compliant with meds  3. Hyperlipidemia - reports side effects on other statins in the past. Has done well on simva 20.  - compliant with meds   4. Sinus tachcyardia Patient with frequent elevated heart rates in clinic. Checks heart rates regularly at home, typically in low 90s. Often has anxiety in the clinic - no recent symptoms.     5. Chronic LBBB   Past Medical History:  Diagnosis Date  . Aortic stenosis, mild 2000   And insufficiency, evaluation by Dr. Verl Blalock in 2000  . Arthritis   . Benign prostatic hypertrophy    PSA of 1.39 in 09/2010  . Chronic kidney disease    Borderline; creatinine of 1.6 in 08/2009, but 1.15 in 05/2010  . Chronic sinusitis    By MRI  . Diverticulosis 2013  . Hearing impaired   . Hemorrhoids 2006  . Hiatal hernia 2006  . Hyperlipidemia    Elevated triglycerides  . Hypertension    Mild internal carotid artery plaque on MRI/MRA in 2002  . IBS (irritable bowel syndrome)   . Insomnia   . Left bundle Baldo Hufnagle block 2012   2012  . Metabolic syndrome    Fasting hyperglycemia  . Murmur   . Nephrolithiasis 08/2009   2011  . Obesity   . Proctitis    radiation induced  . Prostate cancer ([place]) 11/2010   s/p XRT  . Seasonal allergies   . Stroke [facility])    TIA, no residual, seen on MRI  . 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 Uses hearing aid    bilateral     Allergies  Allergen Reactions  . Norvasc [Amlodipine Besylate] Swelling    Ankle  swelling  . Codeine Nausea And Vomiting     Current Outpatient Medications  Medication Sig Dispense Refill  . acyclovir (ZOVIRAX) 800 MG tablet Take 800 mg by mouth 3 (three) times daily.     Marland Kitchen ALPRAZolam (XANAX) 0.5 MG tablet Take 0.5 mg by mouth at bedtime as needed. For sleep    . aspirin EC 81 MG tablet Take 81 mg by mouth daily.    . cholecalciferol (VITAMIN D) 1000 UNITS tablet Take 1,000 Units by mouth daily.      . fexofenadine (ALLEGRA) 180 MG tablet Take 180 mg by mouth daily as needed. For allergies    . glycopyrrolate (ROBINUL) 2 MG tablet Take 2 mg by mouth 2 (two) times daily as needed (IBS).     . hydrochlorothiazide (HYDRODIURIL) 25 MG tablet Take 0.5 tablets (12.5 mg total) by mouth daily. 45 tablet 0  . lipase/protease/amylase (CREON) 36000 UNITS CPEP capsule Take 3 with meals and 2 with snacks. 300 capsule 11  . meclizine (ANTIVERT) 25 MG tablet Take 25 mg by mouth at bedtime.     . Melatonin 3 MG TABS Take 1 tablet by mouth at  bedtime. For sleep    . Omega-3 Fatty Acids (OMEGA-3 FISH OIL) 1200 MG CAPS Take 1 capsule by mouth daily.      . simvastatin (ZOCOR) 20 MG tablet Take 20 mg by mouth every evening.    . terazosin (HYTRIN) 5 MG capsule Take 10 mg by mouth at bedtime.     . vitamin C (ASCORBIC ACID) 500 MG tablet Take 1,000 mg by mouth daily.      No current facility-administered medications for this visit.      Past Surgical History:  Procedure Laterality Date  . ANKLE FRACTURE SURGERY     left  . CATARACT EXTRACTION W/PHACO Right 12/19/2013   Procedure: CATARACT EXTRACTION PHACO AND INTRAOCULAR LENS PLACEMENT RIGHT EYE CDE=14.78;  Surgeon: Tonny , MD;  Location: [facility] ORS;  Service: Ophthalmology;  Laterality: Right;  . CHOLECYSTECTOMY    . COLONOSCOPY  2006   Dr. Ward Givens mucosa throughout colon, moderate internal hemorrhoids. bx= benign colonic mucosa  . COLONOSCOPY  12/03/2011   Dr. Gala Romney- colonic diverticulosis, radiation-induced proctitis- s/p  APC ablation, no microscopic colitis on bx  . ESOPHAGOGASTRODUODENOSCOPY  2006   Dr. Oletta Lamas- small hiatal hernia and slightly reddened distal esophagus o/w normal  . FLEXIBLE SIGMOIDOSCOPY N/A 07/13/2013   [id number] changes of the rectum with active oozing consistent with chronic radiation proctitis.  Otherwise, negative sigmoidoscopy to 40 cm.Status post argon plasma coagulation ablation   . HOT HEMOSTASIS N/A 07/13/2013   Procedure: HOT HEMOSTASIS (ARGON PLASMA COAGULATION/BICAP);  Surgeon: Daneil Dolin, MD;  Location: [facility] SUITE;  Service: Endoscopy;  Laterality: N/A;  . Ileocolonoscopy  12/03/2011   [id number] diverticulosis. Radiation-induced proctitis status post APC ablation. Status post segmental colon Biopsy  . TONSILLECTOMY       Allergies  Allergen Reactions  . Norvasc [Amlodipine Besylate] Swelling    Ankle swelling  . Codeine Nausea And Vomiting      Family History  Problem Relation Age of Onset  . Heart failure Mother   . Heart attack Father   . Heart failure Brother        bladder cancer (agent orange exposure)  . Cirrhosis Brother        chronic viral hepatitis  . Colon cancer Neg Hx      Social History Mr. Appling reports that he quit smoking about 19 years ago. He has a 60.00 pack-year smoking history. he has never used smokeless tobacco. Mr. Hollabaugh reports that he does not drink alcohol.   Review of Systems CONSTITUTIONAL: No weight loss, fever, chills, weakness or fatigue.  HEENT: Eyes: No visual loss, blurred vision, double vision or yellow sclerae.No hearing loss, sneezing, congestion, runny nose or sore throat.  SKIN: No rash or itching.  CARDIOVASCULAR: per hpi RESPIRATORY: per hpi GASTROINTESTINAL: No anorexia, nausea, vomiting or diarrhea. No abdominal pain or blood.  GENITOURINARY: No burning on urination, no polyuria NEUROLOGICAL: No headache, dizziness, syncope, paralysis, ataxia, numbness or tingling in the extremities. No change  in bowel or bladder control.  MUSCULOSKELETAL: No muscle, back pain, joint pain or stiffness.  LYMPHATICS: No enlarged nodes. No history of splenectomy.  PSYCHIATRIC: No history of depression or anxiety.  ENDOCRINOLOGIC: No reports of sweating, cold or heat intolerance. No polyuria or polydipsia.  Marland Kitchen   Physical Examination Vitals:   07/15/17 1401  BP: 130/76  Pulse: 87  SpO2: 98%   Vitals:   07/15/17 1401  Weight: 181 lb (82.1 kg)  Height: 5\' 6"  (1.676 m)    Gen: resting  comfortably, no acute distress HEENT: no scleral icterus, pupils equal round and reactive, no palptable cervical adenopathy,  CV: RRR, 3/6 systolic murmur rusb, no jvd Resp: Clear to auscultation bilaterally GI: abdomen is soft, non-tender, non-distended, normal bowel sounds, no hepatosplenomegaly MSK: extremities are warm, no edema.  Skin: warm, no rash Neuro:  no focal deficits Psych: appropriate affect   Diagnostic Studies 07/2012 Echo LVEF 55-60%, no WMA, mild-mod AS (mean grad 11, AVA by planimetry 1.2, continuity area not listed)  07/2014 echo Study Conclusions  - Left ventricle: The cavity size was normal. Wall thickness was increased in a pattern of mild LVH. Systolic function was normal. The estimated ejection fraction was in the range of 60% to 65%. Wall motion was normal; there were no regional wall motion abnormalities. Doppler parameters are consistent with abnormal left ventricular relaxation (grade 1 diastolic dysfunction). - Ventricular septum: Septal motion showed abnormal function and dyssynergy. - Aortic valve: Moderately calcified annulus. Trileaflet; moderately calcified leaflets. Cusp separation was moderately reduced. There was moderate stenosis. There was mild regurgitation. Mean gradient (S): 18 mm Hg. Peak gradient (S): 32 mm Hg. VTI ratio of LVOT to aortic valve: 0.43. Valve area (VTI): 1.25 cm^2. Planimetry valve area 1.5 cm2. - Mitral valve:  Moderately calcified annulus. There was trivial regurgitation. - Left atrium: The atrium was mildly dilated. - Right atrium: Central venous pressure (est): 3 mm Hg. - Atrial septum: No defect or patent foramen ovale was identified. - Tricuspid valve: There was trivial regurgitation. - Pulmonary arteries: Systolic pressure could not be accurately estimated. - Pericardium, extracardiac: There was no pericardial effusion.   07/2016 echo Study Conclusions  - Left ventricle: The cavity size was normal. Wall thickness was increased in a pattern of mild LVH. Systolic function was normal. The estimated ejection fraction was in the range of 60% to 65%. Wall motion was normal; there were no regional wall motion abnormalities. - Aortic valve: Moderately calcified annulus. Moderately calcified leaflets. There was mild regurgitation. Mean gradient (S): 18 mm Hg. Peak gradient (S): 36 mm Hg. VTI ratio of LVOT to aortic valve: 0.38. Valve area (VTI): 0.77 cm^2. Somewhat discordant information in grading degree of aortic stenosis. Gradients suggest mild range, however valve appearance and LVOT to AV ratios are more consistent with moderate to severe aortic stenosis. - Mitral valve: Moderately calcified annulus. There was trivial regurgitation. - Right atrium: Central venous pressure (est): 3 mm Hg. - Atrial septum: No defect or patent foramen ovale was identified. - Tricuspid valve: There was trivial regurgitation. - Pulmonary arteries: Systolic pressure could not be accurately estimated. - Pericardium, extracardiac: A prominent pericardial fat pad was present.  Impressions:  - Mild LVH with LVEF 60-65%. Grade 2 diastolic dysfunction suspected. Aortic valve is moderately calcified with at least moderate calcific aortic stenosis as detailed above, information is somewhat discordant however. Mild aortic regurgitation. Moderate mitral annular  calcification with trivial mitral regurgitation. Trivial tricuspid regurgitation.    Assessment and Plan  1. Aortic stenosis - we will repeat echo for continued surveillance     2. HTN - bp at goal, continue current meds  3. Hyperlipidemia - request labs from pcp - continue statin       Arnoldo Lenis, M.D.

## 2017-07-16 ENCOUNTER — Encounter: Payer: Self-pay | Admitting: Cardiology

## 2017-07-20 ENCOUNTER — Other Ambulatory Visit (HOSPITAL_COMMUNITY): Payer: Medicare Other

## 2017-07-20 ENCOUNTER — Ambulatory Visit (HOSPITAL_COMMUNITY)
Admission: RE | Admit: 2017-07-20 | Discharge: 2017-07-20 | Disposition: A | Discharge status: Home / Self Care | Payer: Medicare Other | Source: Ambulatory Visit | Attending: Cardiology | Admitting: Cardiology

## 2017-07-20 DIAGNOSIS — I061 Rheumatic aortic insufficiency: Secondary | ICD-10-CM | POA: Diagnosis not present

## 2017-07-20 DIAGNOSIS — I5030 Unspecified diastolic (congestive) heart failure: Secondary | ICD-10-CM | POA: Diagnosis not present

## 2017-07-20 DIAGNOSIS — I350 Nonrheumatic aortic (valve) stenosis: Secondary | ICD-10-CM | POA: Diagnosis present

## 2017-07-20 LAB — ECHOCARDIOGRAM COMPLETE
AO mean calculated velocity dopler: 205 cm/s
AV Area VTI: 0.9 cm2
AV Area mean vel: 0.99 cm2
AV Mean grad: 20 mmHg
AV Peak grad: 42 mmHg
AV VEL mean LVOT/AV: 0.35
AV peak Index: 0.46
AV pk vel: 325 cm/s
AVA: 1 cm2
AVAREAMEANVIN: 0.5 cm2/m2
AVAREAVTIIND: 0.51 cm2/m2
AVLVOTPG: 4 mmHg
Ao pk vel: 0.32 m/s
CHL CUP AV VALUE AREA INDEX: 0.51
CHL CUP AV VEL: 1
CHL CUP DOP CALC LVOT VTI: 22.3 cm
CHL CUP STROKE VOLUME: 41 mL
CHL CUP TV REG PEAK VELOCITY: 213 cm/s
E decel time: 190 msec
EERAT: 26.29
FS: 44 % (ref 28–44)
IVS/LV PW RATIO, ED: 1.05
LA diam index: 1.62 cm/m2
LA vol A4C: 37.4 ml
LA vol: 48.1 mL
LASIZE: 32 mm
LAVOLIN: 24.3 mL/m2
LDCA: 2.84 cm2
LEFT ATRIUM END SYS DIAM: 32 mm
LV E/e' medial: 26.29
LV E/e'average: 26.29
LV dias vol index: 34 mL/m2
LV e' LATERAL: 6.2 cm/s
LV sys vol index: 14 mL/m2
LVDIAVOL: 68 mL (ref 62–150)
LVOT SV: 63 mL
LVOT diameter: 19 mm
LVOT peak vel: 103 cm/s
LVOTVTI: 0.35 cm
LVSYSVOL: 28 mL
Lateral S' vel: 9.79 cm/s
MV Dec: 190
MV pk E vel: 163 m/s
MVPG: 11 mmHg
P 1/2 time: 456 ms
PW: 15.3 mm — AB (ref 0.6–1.1)
RV TAPSE: 10.8 mm
RV sys press: 21 mmHg
Simpson's disk: 59
TDI e' lateral: 6.2
TRMAXVEL: 213 cm/s
VTI: 63.2 cm

## 2017-07-20 NOTE — Progress Notes (Signed)
*  PRELIMINARY RESULTS* Echocardiogram 2D Echocardiogram has been performed.  Calvin Moreno 07/20/2017, 1:59 PM

## 2018-01-18 ENCOUNTER — Ambulatory Visit (INDEPENDENT_AMBULATORY_CARE_PROVIDER_SITE_OTHER): Payer: Medicare Other | Admitting: Cardiology

## 2018-01-18 ENCOUNTER — Encounter: Payer: Self-pay | Admitting: Cardiology

## 2018-01-18 VITALS — BP 130/74 | HR 102 | Ht 66.0 in | Wt 177.0 lb

## 2018-01-18 DIAGNOSIS — I10 Essential (primary) hypertension: Secondary | ICD-10-CM | POA: Diagnosis not present

## 2018-01-18 DIAGNOSIS — E782 Mixed hyperlipidemia: Secondary | ICD-10-CM

## 2018-01-18 DIAGNOSIS — I350 Nonrheumatic aortic (valve) stenosis: Secondary | ICD-10-CM

## 2018-01-18 NOTE — Patient Instructions (Signed)

## 2018-01-18 NOTE — Progress Notes (Signed)
Clinical Summary Calvin Moreno is a 77 y.o.male seen today for follow up of lthe following medical problems.  1. Aortic stenosis - mild to moderate AS by echo 07/2014 with mean grad 18, AVA 1.25.    - 06/2017 echo LVEF 60-65%, grade II diastolic dysfunction, moderate to severe AS per report (mean grad 22, AVA VTI 1, dimensionless index 0.35). Criteria would suggest more moderate AS  - some SOB he associates with deconditioning. Some chest pain better with belching. No syncope   2. HTN - he is compliant with meds  3. Hyperlipidemia - reports side effects on other statins in the past. Has done well on simva 20. - last labs with pcp   4. Sinus tachcyardia Patient with frequent elevated heart rates in clinic. Checks heart rates regularly at home, typically in low 90s. Often has anxiety in the clinic - no recent symptoms.     5. Chronic LBBB   Past Medical History:  Diagnosis Date  . Aortic stenosis, mild 2000   And insufficiency, evaluation by Calvin Moreno in 2000  . Arthritis   . Benign prostatic hypertrophy    PSA of 1.39 in 09/2010  . Chronic kidney disease    Borderline; creatinine of 1.6 in 08/2009, but 1.15 in 05/2010  . Chronic sinusitis    By MRI  . Diverticulosis 2013  . Hearing impaired   . Hemorrhoids 2006  . Hiatal hernia 2006  . Hyperlipidemia    Elevated triglycerides  . Hypertension    Mild internal carotid artery plaque on MRI/MRA in 2002  . IBS (irritable bowel syndrome)   . Insomnia   . Left bundle Calvin Moreno block 2012   2012  . Metabolic syndrome    Fasting hyperglycemia  . Murmur   . Nephrolithiasis 08/2009   2011  . Obesity   . Proctitis    radiation induced  . Prostate cancer (Calvin Moreno) 11/2010   s/p XRT  . Seasonal allergies   . Stroke [facility] More[facility])    TIA, no residual, seen on MRI  . 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 Uses hearing aid    bilateral     Allergies  Allergen Reactions  . Norvasc [Amlodipine  Besylate] Swelling    Ankle swelling  . Codeine Nausea And Vomiting     Current Outpatient Medications  Medication Sig Dispense Refill  . acyclovir (ZOVIRAX) 800 MG tablet Take 800 mg by mouth 3 (three) times daily.     Marland Kitchen ALPRAZolam (XANAX) 0.5 MG tablet Take 0.5 mg by mouth at bedtime as needed. For sleep    . aspirin EC 81 MG tablet Take 81 mg by mouth daily.    . cholecalciferol (VITAMIN D) 1000 UNITS tablet Take 1,000 Units by mouth daily.      . fexofenadine (ALLEGRA) 180 MG tablet Take 180 mg by mouth daily as needed. For allergies    . glycopyrrolate (ROBINUL) 2 MG tablet Take 2 mg by mouth 2 (two) times daily as needed (IBS).     . hydrochlorothiazide (HYDRODIURIL) 25 MG tablet Take 0.5 tablets (12.5 mg total) by mouth daily. 45 tablet 0  . lipase/protease/amylase (CREON) 36000 UNITS CPEP capsule Take 3 with meals and 2 with snacks. 300 capsule 11  . meclizine (ANTIVERT) 25 MG tablet Take 25 mg by mouth at bedtime.     . Melatonin 3 MG TABS Take 1 tablet by mouth at bedtime. For sleep    .  Omega-3 Fatty Acids (OMEGA-3 FISH OIL) 1200 MG CAPS Take 1 capsule by mouth daily.      . simvastatin (ZOCOR) 20 MG tablet Take 20 mg by mouth every evening.    . terazosin (HYTRIN) 5 MG capsule Take 10 mg by mouth at bedtime.     . vitamin C (ASCORBIC ACID) 500 MG tablet Take 1,000 mg by mouth daily.      No current facility-administered medications for this visit.      Past Surgical History:  Procedure Laterality Date  . ANKLE FRACTURE SURGERY     left  . CATARACT EXTRACTION W/PHACO Right 12/19/2013   Procedure: CATARACT EXTRACTION PHACO AND INTRAOCULAR LENS PLACEMENT RIGHT EYE CDE=14.78;  Surgeon: Calvin Moreno;  Location: [facility];  Service: Ophthalmology;  Laterality: Right;  . CHOLECYSTECTOMY    . COLONOSCOPY  2006   Calvin Moreno Moreno throughout colon, moderate internal hemorrhoids. bx= benign colonic Moreno  . COLONOSCOPY  12/03/2011   Calvin Moreno- colonic diverticulosis,  radiation-induced proctitis- s/p Calvin Moreno, no microscopic colitis on bx  . ESOPHAGOGASTRODUODENOSCOPY  2006   Calvin Moreno- small hiatal hernia and slightly reddened distal esophagus o/w normal  . FLEXIBLE SIGMOIDOSCOPY N/A 07/13/2013   [id number] changes of the rectum with active oozing consistent with chronic radiation proctitis.  Otherwise, negative sigmoidoscopy to 40 cm.Status post argon plasma coagulation Moreno   . HOT HEMOSTASIS N/A 07/13/2013   Procedure: HOT HEMOSTASIS (ARGON PLASMA COAGULATION/BICAP);  Surgeon: Calvin Moreno;  Location: [facility];  Service: Endoscopy;  Laterality: N/A;  . Ileocolonoscopy  12/03/2011   [id number] diverticulosis. Radiation-induced proctitis status post Calvin Moreno. Status post segmental colon Biopsy  . TONSILLECTOMY       Allergies  Allergen Reactions  . Norvasc [Amlodipine Besylate] Swelling    Ankle swelling  . Codeine Nausea And Vomiting      Family History  Problem Relation Age of Onset  . Heart failure Mother   . Heart attack Father   . Heart failure Brother        bladder cancer (agent orange exposure)  . Cirrhosis Brother        chronic viral hepatitis  . Colon cancer Neg Hx      Social History Calvin Moreno reports that he quit smoking about 20 years ago. He has a 60.00 pack-year smoking history. He has never used smokeless tobacco. Calvin Moreno reports that he does not drink alcohol.   Review of Systems CONSTITUTIONAL: No weight loss, fever, chills, weakness or fatigue.  HEENT: Eyes: No visual loss, blurred vision, double vision or yellow sclerae.No hearing loss, sneezing, congestion, runny nose or sore throat.  SKIN: No rash or itching.  CARDIOVASCULAR:  RESPIRATORY: No shortness of breath, cough or sputum.  GASTROINTESTINAL: No anorexia, nausea, vomiting or diarrhea. No abdominal pain or blood.  GENITOURINARY: No burning on urination, no polyuria NEUROLOGICAL: No headache, dizziness, syncope, paralysis,  ataxia, numbness or tingling in the extremities. No change in bowel or bladder control.  MUSCULOSKELETAL: No muscle, back pain, joint pain or stiffness.  LYMPHATICS: No enlarged nodes. No history of splenectomy.  PSYCHIATRIC: No history of depression or anxiety.  ENDOCRINOLOGIC: No reports of sweating, cold or heat intolerance. No polyuria or polydipsia.  Marland Kitchen   Physical Examination Vitals:   01/18/18 1253  BP: 130/74  Pulse: (!) 102  SpO2: 98%   Vitals:   01/18/18 1253  Weight: 177 lb (80.3 kg)  Height: 5\' 6"  (1.676 m)    Gen: resting comfortably,  no acute distress HEENT: no scleral icterus, pupils equal round and reactive, no palptable cervical adenopathy,  CV: RRR, 3/6 systolic murmur rusb, no jvd Resp: Clear to auscultation bilaterally GI: abdomen is soft, non-tender, non-distended, normal bowel sounds, no hepatosplenomegaly MSK: extremities are warm, no edema.  Skin: warm, no rash Neuro:  no focal deficits Psych: appropriate affect   Diagnostic Studies 07/2012 Echo LVEF 55-60%, no WMA, mild-mod AS (mean grad 11, AVA by planimetry 1.2, continuity area not listed)  07/2014 echo Study Conclusions  - Left ventricle: The cavity size was normal. Wall thickness was increased in a pattern of mild LVH. Systolic function was normal. The estimated ejection fraction was in the range of 60% to 65%. Wall motion was normal; there were no regional wall motion abnormalities. Doppler parameters are consistent with abnormal left ventricular relaxation (grade 1 diastolic dysfunction). - Ventricular septum: Septal motion showed abnormal function and dyssynergy. - Aortic valve: Moderately calcified annulus. Trileaflet; moderately calcified leaflets. Cusp separation was moderately reduced. There was moderate stenosis. There was mild regurgitation. Mean gradient (S): 18 mm Hg. Peak gradient (S): 32 mm Hg. VTI ratio of LVOT to aortic valve: 0.43. Valve area  (VTI): 1.25 cm^2. Planimetry valve area 1.5 cm2. - Mitral valve: Moderately calcified annulus. There was trivial regurgitation. - Left atrium: The atrium was mildly dilated. - Right atrium: Central venous pressure (est): 3 mm Hg. - Atrial septum: No defect or patent foramen ovale was identified. - Tricuspid valve: There was trivial regurgitation. - Pulmonary arteries: Systolic pressure could not be accurately estimated. - Pericardium, extracardiac: There was no pericardial effusion.   07/2016 echo Study Conclusions  - Left ventricle: The cavity size was normal. Wall thickness was increased in a pattern of mild LVH. Systolic function was normal. The estimated ejection fraction was in the range of 60% to 65%. Wall motion was normal; there were no regional wall motion abnormalities. - Aortic valve: Moderately calcified annulus. Moderately calcified leaflets. There was mild regurgitation. Mean gradient (S): 18 mm Hg. Peak gradient (S): 36 mm Hg. VTI ratio of LVOT to aortic valve: 0.38. Valve area (VTI): 0.77 cm^2. Somewhat discordant information in grading degree of aortic stenosis. Gradients suggest mild range, however valve appearance and LVOT to AV ratios are more consistent with moderate to severe aortic stenosis. - Mitral valve: Moderately calcified annulus. There was trivial regurgitation. - Right atrium: Central venous pressure (est): 3 mm Hg. - Atrial septum: No defect or patent foramen ovale was identified. - Tricuspid valve: There was trivial regurgitation. - Pulmonary arteries: Systolic pressure could not be accurately estimated. - Pericardium, extracardiac: A prominent pericardial fat pad was present.  Impressions:  - Mild LVH with LVEF 60-65%. Grade 2 diastolic dysfunction suspected. Aortic valve is moderately calcified with at least moderate calcific aortic stenosis as detailed above, information is somewhat discordant  however. Mild aortic regurgitation. Moderate mitral annular calcification with trivial mitral regurgitation. Trivial tricuspid regurgitation.  06/2017 echo Study Conclusions  - Left ventricle: The cavity size was normal. There was severe   concentric hypertrophy. Systolic function was normal. The   estimated ejection fraction was in the range of 60% to 65%. Wall   motion was normal; there were no regional wall motion   abnormalities. Features are consistent with a pseudonormal left   ventricular filling pattern, with concomitant abnormal relaxation   and increased filling pressure (grade 2 diastolic dysfunction).   Doppler parameters are consistent with high ventricular filling   pressure. - Ventricular septum: Septal motion  showed abnormal function and   dyssynergy. These changes are consistent with a left bundle   Tranquilino Fischler block. - Aortic valve: Moderately to severely calcified annulus.   Trileaflet; moderately calcified leaflets. There was moderate to   severe stenosis. There was mild regurgitation. Peak velocity (S):   325 cm/s. Mean gradient (S): 22 mm Hg. Valve area (VTI): 1 cm^2.   Valve area (Vmax): 0.9 cm^2. Valve area (Vmean): 0.99 cm^2. - Mitral valve: Moderately calcified annulus. - Right ventricle: Systolic function was mildly reduced. - Atrial septum: No defect or patent foramen ovale was identified.  Assessment and Plan  1. Aortic stenosis - moderate by majority of criteria, asymptomatic. COntinue to monitor at this time, repeat echo after next appointment in 06/2018 - EKG today shows mild sinus tach chronic LBBB  2. HTN - at goal continue current meds  3. Hyperlipidemia - continue statin, request labs from pcp      Arnoldo Lenis, M.D.

## 2018-02-23 ENCOUNTER — Ambulatory Visit (HOSPITAL_COMMUNITY)
Admission: RE | Admit: 2018-02-23 | Discharge: 2018-02-23 | Disposition: A | Discharge status: Home / Self Care | Payer: Medicare Other | Source: Ambulatory Visit | Attending: Nurse Practitioner | Admitting: Nurse Practitioner

## 2018-02-23 ENCOUNTER — Other Ambulatory Visit (HOSPITAL_COMMUNITY): Payer: Self-pay | Admitting: Nurse Practitioner

## 2018-02-23 DIAGNOSIS — R05 Cough: Secondary | ICD-10-CM | POA: Insufficient documentation

## 2018-02-23 DIAGNOSIS — R053 Chronic cough: Secondary | ICD-10-CM

## 2018-06-17 ENCOUNTER — Emergency Department (HOSPITAL_COMMUNITY): Payer: Medicare Other

## 2018-06-17 ENCOUNTER — Encounter (HOSPITAL_COMMUNITY): Payer: Self-pay | Admitting: Emergency Medicine

## 2018-06-17 ENCOUNTER — Emergency Department (HOSPITAL_COMMUNITY)
Admission: EM | Admit: 2018-06-17 | Discharge: 2018-06-17 | Disposition: A | Discharge status: Home / Self Care | Payer: Medicare Other | Attending: Emergency Medicine | Admitting: Emergency Medicine

## 2018-06-17 ENCOUNTER — Other Ambulatory Visit: Payer: Self-pay

## 2018-06-17 DIAGNOSIS — Z87891 Personal history of nicotine dependence: Secondary | ICD-10-CM | POA: Insufficient documentation

## 2018-06-17 DIAGNOSIS — R109 Unspecified abdominal pain: Secondary | ICD-10-CM | POA: Diagnosis not present

## 2018-06-17 DIAGNOSIS — I129 Hypertensive chronic kidney disease with stage 1 through stage 4 chronic kidney disease, or unspecified chronic kidney disease: Secondary | ICD-10-CM | POA: Insufficient documentation

## 2018-06-17 DIAGNOSIS — Z8673 Personal history of transient ischemic attack (TIA), and cerebral infarction without residual deficits: Secondary | ICD-10-CM | POA: Diagnosis not present

## 2018-06-17 DIAGNOSIS — N23 Unspecified renal colic: Secondary | ICD-10-CM | POA: Insufficient documentation

## 2018-06-17 DIAGNOSIS — N189 Chronic kidney disease, unspecified: Secondary | ICD-10-CM | POA: Diagnosis not present

## 2018-06-17 DIAGNOSIS — Z79899 Other long term (current) drug therapy: Secondary | ICD-10-CM | POA: Insufficient documentation

## 2018-06-17 DIAGNOSIS — Z7982 Long term (current) use of aspirin: Secondary | ICD-10-CM | POA: Insufficient documentation

## 2018-06-17 DIAGNOSIS — Z8546 Personal history of malignant neoplasm of prostate: Secondary | ICD-10-CM | POA: Insufficient documentation

## 2018-06-17 LAB — CBC WITH DIFFERENTIAL/PLATELET
Abs Immature Granulocytes: 0.01 10*3/uL (ref 0.00–0.07)
Basophils Absolute: 0 10*3/uL (ref 0.0–0.1)
Basophils Relative: 0 %
Eosinophils Absolute: 0.2 10*3/uL (ref 0.0–0.5)
Eosinophils Relative: 4 %
HCT: 45.4 % (ref 39.0–52.0)
Hemoglobin: 15.1 g/dL (ref 13.0–17.0)
Immature Granulocytes: 0 %
LYMPHS ABS: 1.3 10*3/uL (ref 0.7–4.0)
Lymphocytes Relative: 25 %
MCH: 35 pg — AB (ref 26.0–34.0)
MCHC: 33.3 g/dL (ref 30.0–36.0)
MCV: 105.3 fL — ABNORMAL HIGH (ref 80.0–100.0)
MONO ABS: 0.3 10*3/uL (ref 0.1–1.0)
Monocytes Relative: 6 %
Neutro Abs: 3.3 10*3/uL (ref 1.7–7.7)
Neutrophils Relative %: 65 %
Platelets: 183 10*3/uL (ref 150–400)
RBC: 4.31 MIL/uL (ref 4.22–5.81)
RDW: 12.1 % (ref 11.5–15.5)
WBC: 5.2 10*3/uL (ref 4.0–10.5)
nRBC: 0 % (ref 0.0–0.2)

## 2018-06-17 LAB — URINALYSIS, ROUTINE W REFLEX MICROSCOPIC
Bilirubin Urine: NEGATIVE
Glucose, UA: NEGATIVE mg/dL
KETONES UR: NEGATIVE mg/dL
Leukocytes, UA: NEGATIVE
Nitrite: NEGATIVE
PROTEIN: NEGATIVE mg/dL
Specific Gravity, Urine: 1.023 (ref 1.005–1.030)
pH: 5 (ref 5.0–8.0)

## 2018-06-17 LAB — BASIC METABOLIC PANEL
Anion gap: 9 (ref 5–15)
BUN: 14 mg/dL (ref 8–23)
CO2: 27 mmol/L (ref 22–32)
Calcium: 9 mg/dL (ref 8.9–10.3)
Chloride: 101 mmol/L (ref 98–111)
Creatinine, Ser: 1.23 mg/dL (ref 0.61–1.24)
GFR calc Af Amer: >60 mL/min (ref >60)
GFR calc non Af Amer: 55 mL/min — ABNORMAL LOW (ref >60)
Glucose, Bld: 170 mg/dL — ABNORMAL HIGH (ref 70–99)
Potassium: 3.5 mmol/L (ref 3.5–5.1)
Sodium: 137 mmol/L (ref 135–145)

## 2018-06-17 MED ORDER — ONDANSETRON HCL 4 MG PO TABS: 4.0000 mg | ORAL_TABLET | Freq: Four times a day (QID) | ORAL | 0 refills | Status: DC | PRN

## 2018-06-17 MED ORDER — HYDROMORPHONE HCL 1 MG/ML IJ SOLN
0.5000 mg | Freq: Once | INTRAMUSCULAR | Status: AC
  Administered 2018-06-17: 0.5 mg via INTRAVENOUS
  Filled 2018-06-17: qty 1

## 2018-06-17 MED ORDER — OXYCODONE-ACETAMINOPHEN 5-325 MG PO TABS: 1.0000 | ORAL_TABLET | ORAL | 0 refills | Status: DC | PRN

## 2018-06-17 MED ORDER — HYDROMORPHONE HCL 1 MG/ML IJ SOLN
1.0000 mg | Freq: Once | INTRAMUSCULAR | Status: AC
  Administered 2018-06-17: 1 mg via INTRAVENOUS
  Filled 2018-06-17: qty 1

## 2018-06-17 MED ORDER — TAMSULOSIN HCL 0.4 MG PO CAPS: 0.4000 mg | ORAL_CAPSULE | Freq: Every day | ORAL | 0 refills | Status: DC

## 2018-06-17 MED ORDER — ONDANSETRON HCL 4 MG/2ML IJ SOLN
4.0000 mg | Freq: Once | INTRAMUSCULAR | Status: AC
  Administered 2018-06-17: 4 mg via INTRAVENOUS
  Filled 2018-06-17: qty 2

## 2018-06-17 NOTE — ED Provider Notes (Signed)
[facility] EMERGENCY DEPARTMENT Provider Note   CSN: [id number] Arrival date & time: 06/17/18  0602     History   Chief Complaint Chief Complaint  Patient presents with  . Flank Pain    HPI Calvin Moreno is a 78 y.o. male.  Patient presents to the emergency department for evaluation of flank pain.  Patient reports that he was awakened from sleep by severe right flank pain this morning.  He reports that the pain was so bad that it caused him to have nausea and vomiting.  Patient reports that he had a kidney stone 8 or 10 years ago and thinks this feels similar.  He has not noticed any urinary frequency, dysuria or hematuria.  Pain is all in the low back, he has not had any abdominal pain.     Past Medical History:  Diagnosis Date  . Aortic stenosis, mild 2000   And insufficiency, evaluation by Dr. Verl Blalock in 2000  . Arthritis   . Benign prostatic hypertrophy    PSA of 1.39 in 09/2010  . Chronic kidney disease    Borderline; creatinine of 1.6 in 08/2009, but 1.15 in 05/2010  . Chronic sinusitis    By MRI  . Diverticulosis 2013  . Hearing impaired   . Hemorrhoids 2006  . Hiatal hernia 2006  . Hyperlipidemia    Elevated triglycerides  . Hypertension    Mild internal carotid artery plaque on MRI/MRA in 2002  . IBS (irritable bowel syndrome)   . Insomnia   . Left bundle branch block 2012   2012  . Metabolic syndrome    Fasting hyperglycemia  . Murmur   . Nephrolithiasis 08/2009   2011  . Obesity   . Proctitis    radiation induced  . Prostate cancer ([place]) 11/2010   s/p XRT  . Seasonal allergies   . Stroke [facility])    TIA, no residual, seen on MRI  . 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 Uses hearing aid    bilateral    Patient Active Problem List   Diagnosis Date Noted  . 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 Rectal bleed 11/06/2011  . Bowel habit changes 11/06/2011  . Aortic stenosis, mild   . Hyperlipidemia   . Left bundle branch block    . Tobacco abuse, in remission   . Metabolic syndrome   . Hypertension   . OBESITY 07/02/2010    Past Surgical History:  Procedure Laterality Date  . ANKLE FRACTURE SURGERY     left  . CATARACT EXTRACTION W/PHACO Right 12/19/2013   Procedure: CATARACT EXTRACTION PHACO AND INTRAOCULAR LENS PLACEMENT RIGHT EYE CDE=14.78;  Surgeon: Tonny Branch, MD;  Location: [facility] ORS;  Service: Ophthalmology;  Laterality: Right;  . CHOLECYSTECTOMY    . COLONOSCOPY  2006   Dr. Ward Givens mucosa throughout colon, moderate internal hemorrhoids. bx= benign colonic mucosa  . COLONOSCOPY  12/03/2011   Dr. Gala Romney- colonic diverticulosis, radiation-induced proctitis- s/p APC ablation, no microscopic colitis on bx  . ESOPHAGOGASTRODUODENOSCOPY  2006   Dr. Oletta Lamas- small hiatal hernia and slightly reddened distal esophagus o/w normal  . FLEXIBLE SIGMOIDOSCOPY N/A 07/13/2013   BDZ:[id number] changes of the rectum with active oozing consistent with chronic radiation proctitis.  Otherwise, negative sigmoidoscopy to 40 cm.Status post argon plasma coagulation ablation   . HOT HEMOSTASIS N/A 07/13/2013   Procedure: HOT HEMOSTASIS (ARGON PLASMA COAGULATION/BICAP);  Surgeon: Daneil Dolin, MD;  Location: [facility] ENDO SUITE;  Service: Endoscopy;  Laterality: N/A;  . Ileocolonoscopy  12/03/2011   GEZ:[id number] diverticulosis. Radiation-induced proctitis status post APC ablation. Status post segmental colon Biopsy  . TONSILLECTOMY          Home Medications    Prior to Admission medications   Medication Sig Start Date End Date Taking? Authorizing Provider  acyclovir (ZOVIRAX) 800 MG tablet Take 800 mg by mouth 3 (three) times daily.     [provider]  ALPRAZolam Duanne Moron) 0.5 MG tablet Take 0.5 mg by mouth at bedtime as needed. For sleep    [provider]  aspirin EC 81 MG tablet Take 81 mg by mouth daily.    [provider]  cholecalciferol (VITAMIN D) 1000 UNITS tablet Take 1,000 Units by mouth  daily.      [provider]  fexofenadine (ALLEGRA) 180 MG tablet Take 180 mg by mouth daily as needed. For allergies    [provider]  glycopyrrolate (ROBINUL) 2 MG tablet Take 2 mg by mouth 2 (two) times daily as needed (IBS).     [provider]  hydrochlorothiazide (HYDRODIURIL) 25 MG tablet Take 0.5 tablets (12.5 mg total) by mouth daily. 09/19/14   Arnoldo Lenis, MD  lipase/protease/amylase (CREON) 36000 UNITS CPEP capsule Take 3 with meals and 2 with snacks. 08/12/16   Rourk, Cristopher Estimable, MD  meclizine (ANTIVERT) 25 MG tablet Take 25 mg by mouth at bedtime.     [provider]  Melatonin 3 MG TABS Take 1 tablet by mouth at bedtime. For sleep    [provider]  Omega-3 Fatty Acids (OMEGA-3 FISH OIL) 1200 MG CAPS Take 1 capsule by mouth daily.      [provider]  simvastatin (ZOCOR) 20 MG tablet Take 20 mg by mouth every evening.    [provider]  terazosin (HYTRIN) 5 MG capsule Take 10 mg by mouth at bedtime.     [provider]  vitamin C (ASCORBIC ACID) 500 MG tablet Take 1,000 mg by mouth daily.     [provider]    Family History Family History  Problem Relation Age of Onset  . Heart failure Mother   . Heart attack Father   . Heart failure Brother        bladder cancer (agent orange exposure)  . Cirrhosis Brother        chronic viral hepatitis  . Colon cancer Neg Hx     Social History Social History   Tobacco Use  . Smoking status: Former Smoker    Packs/day: 1.50    Years: 40.00    Pack years: 60.00    Last attempt to quit: 11/04/1997    Years since quitting: 20.6  . Smokeless tobacco: Never Used  . Tobacco comment: Quit in 1999  Substance Use Topics  . Alcohol use: No    Alcohol/week: 0.0 standard drinks  . Drug use: No     Allergies   Norvasc [amlodipine besylate] and Codeine   Review of Systems Review of Systems  Gastrointestinal: Positive for nausea and vomiting.    Genitourinary: Positive for flank pain.  All other systems reviewed and are negative.    Physical Exam Updated Vital Signs BP (!) 161/67 (BP Location: Right Arm)   Pulse 100   Resp 19   Ht 5\' 5"  (1.651 m)   Wt 78.5 kg   SpO2 100%   BMI 28.79 kg/m   Physical Exam Vitals signs and nursing note reviewed.  Constitutional:      General: He is not in acute distress.    Appearance: Normal appearance. He is well-developed.  HENT:     Head: Normocephalic and atraumatic.     Right Ear: Hearing normal.     Left Ear: Hearing normal.     Nose: Nose normal.  Eyes:     Conjunctiva/sclera: Conjunctivae normal.     Pupils: Pupils are equal, round, and reactive to light.  Neck:     Musculoskeletal: Normal range of motion and neck supple.  Cardiovascular:     Rate and Rhythm: Regular rhythm.     Heart sounds: S1 normal and S2 normal. No murmur. No friction rub. No gallop.   Pulmonary:     Effort: Pulmonary effort is normal. No respiratory distress.     Breath sounds: Normal breath sounds.  Chest:     Chest wall: No tenderness.  Abdominal:     General: Bowel sounds are normal.     Palpations: Abdomen is soft.     Tenderness: There is no abdominal tenderness. There is right CVA tenderness. There is no guarding or rebound. Negative signs include Murphy's sign and McBurney's sign.     Hernia: No hernia is present.  Musculoskeletal: Normal range of motion.  Skin:    General: Skin is warm and dry.     Findings: No rash.  Neurological:     Mental Status: He is alert and oriented to person, place, and time.     GCS: GCS eye subscore is 4. GCS verbal subscore is 5. GCS motor subscore is 6.     Cranial Nerves: No cranial nerve deficit.     Sensory: No sensory deficit.     Coordination: Coordination normal.  Psychiatric:        Speech: Speech normal.        Behavior: Behavior normal.        Thought Content: Thought content normal.      ED Treatments / Results  Labs (all labs  ordered are listed, but only abnormal results are displayed) Labs Reviewed  CBC WITH DIFFERENTIAL/PLATELET  BASIC METABOLIC PANEL  URINALYSIS, ROUTINE W REFLEX MICROSCOPIC    EKG None  Radiology No results found.  Procedures Procedures (including critical care time)  Medications Ordered in ED Medications  ondansetron (ZOFRAN) injection 4 mg (has no administration in time range)  HYDROmorphone (DILAUDID) injection 1 mg (has no administration in time range)     Initial Impression / Assessment and Plan / ED Course  I have reviewed the triage vital signs and the nursing notes.  Pertinent labs & imaging results that were available during my care of the patient were reviewed by me and considered in my medical decision making (see chart for details).     Patient presents with sudden onset right flank pain associated with nausea and vomiting.  Patient does have a history of a single kidney stone many years ago.  Abdominal exam is benign.  Patient administered analgesia and antiemetics.  Will undergo urinalysis, basic labs and CT renal stone to evaluate for ureterolithiasis.  Will sign out to oncoming ER physician to follow-up results.  Final Clinical Impressions(s) / ED Diagnoses   Final diagnoses:  Right flank pain    ED Discharge Orders    None       Orpah Greek, MD 06/17/18 [phone]

## 2018-06-17 NOTE — ED Triage Notes (Signed)
Pt c/o right flank pain starting this morning with n/v, denies all urinary symptoms

## 2018-06-17 NOTE — Discharge Instructions (Signed)
Schedule follow-up with urology.  If your pain becomes uncontrollable or you develop fever, return to the ER immediately.

## 2018-07-21 ENCOUNTER — Ambulatory Visit (INDEPENDENT_AMBULATORY_CARE_PROVIDER_SITE_OTHER): Payer: Medicare Other | Admitting: Urology

## 2018-07-21 DIAGNOSIS — N201 Calculus of ureter: Secondary | ICD-10-CM | POA: Diagnosis not present

## 2018-07-21 DIAGNOSIS — R3912 Poor urinary stream: Secondary | ICD-10-CM | POA: Diagnosis not present

## 2018-07-22 ENCOUNTER — Ambulatory Visit (INDEPENDENT_AMBULATORY_CARE_PROVIDER_SITE_OTHER): Payer: Medicare Other | Admitting: Cardiology

## 2018-07-22 ENCOUNTER — Other Ambulatory Visit (HOSPITAL_COMMUNITY): Payer: Self-pay | Admitting: Urology

## 2018-07-22 ENCOUNTER — Other Ambulatory Visit: Payer: Self-pay | Admitting: Urology

## 2018-07-22 ENCOUNTER — Encounter: Payer: Self-pay | Admitting: Cardiology

## 2018-07-22 VITALS — BP 134/72 | HR 101 | Ht 66.0 in | Wt 177.4 lb

## 2018-07-22 DIAGNOSIS — I10 Essential (primary) hypertension: Secondary | ICD-10-CM

## 2018-07-22 DIAGNOSIS — E782 Mixed hyperlipidemia: Secondary | ICD-10-CM | POA: Diagnosis not present

## 2018-07-22 DIAGNOSIS — I350 Nonrheumatic aortic (valve) stenosis: Secondary | ICD-10-CM

## 2018-07-22 DIAGNOSIS — N200 Calculus of kidney: Secondary | ICD-10-CM

## 2018-07-22 NOTE — Patient Instructions (Signed)
Medication Instructions: Your physician recommends that you continue on your current medications as directed. Please refer to the Current Medication list given to you today.   Labwork: None today, we will request lab results from [facility]  Procedures/Testing: Your physician has requested that you have an echocardiogram. Echocardiography is a painless test that uses sound waves to create images of your heart. It provides your doctor with information about the size and shape of your heart and how well your heart's chambers and valves are working. This procedure takes approximately one hour. There are no restrictions for this procedure.    Follow-Up: 6 months with Dr.Branch  Any Additional Special Instructions Will Be Listed Below (If Applicable).     If you need a refill on your cardiac medications before your next appointment, please call your pharmacy.

## 2018-07-22 NOTE — Progress Notes (Signed)
Clinical Summary Mr. Doren is a 78 y.o.male seen today for follow up of lthe following medical problems.  1. Aortic stenosis - mild to moderate AS by echo 07/2014 with mean grad 18, AVA 1.25.    - 06/2017 echo LVEF 60-65%, grade II diastolic dysfunction, moderate to severe AS per report (mean grad 22, AVA VTI 1, dimensionless index 0.35). Criteria would suggest more moderate AS   - no new symptoms. No recent chest pain, SOB, presyncope/syncope  2. HTN -compliant with meds  3. Hyperlipidemia - reports side effects on other statins in the past. Has done well on simva 20. -labs followed by pcp   4. Sinus tachcyardia Patient with frequent elevated heart rates in clinic. Checks heart rates regularly at home, typically in low 90s. Often has anxiety in the clinic - no recent symptoms.  - no palpitatons.    5. Chronic LBBB   Past Medical History:  Diagnosis Date  . Aortic stenosis, mild 2000   And insufficiency, evaluation by Dr. Verl Blalock in 2000  . Arthritis   . Benign prostatic hypertrophy    PSA of 1.39 in 09/2010  . Chronic kidney disease    Borderline; creatinine of 1.6 in 08/2009, but 1.15 in 05/2010  . Chronic sinusitis    By MRI  . Diverticulosis 2013  . Hearing impaired   . Hemorrhoids 2006  . Hiatal hernia 2006  . Hyperlipidemia    Elevated triglycerides  . Hypertension    Mild internal carotid artery plaque on MRI/MRA in 2002  . IBS (irritable bowel syndrome)   . Insomnia   . Left bundle Maguadalupe Lata block 2012   2012  . Metabolic syndrome    Fasting hyperglycemia  . Murmur   . Nephrolithiasis 08/2009   2011  . Obesity   . Proctitis    radiation induced  . Prostate cancer ([place]) 11/2010   s/p XRT  . Seasonal allergies   . Stroke [facility])    TIA, no residual, seen on MRI  . 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 Uses hearing aid    bilateral     Allergies  Allergen Reactions  . Norvasc [Amlodipine Besylate]  Swelling    Ankle swelling  . Codeine Nausea And Vomiting     Current Outpatient Medications  Medication Sig Dispense Refill  . acyclovir (ZOVIRAX) 800 MG tablet Take 800 mg by mouth 3 (three) times daily.     Marland Kitchen ALPRAZolam (XANAX) 0.5 MG tablet Take 0.5 mg by mouth at bedtime as needed. For sleep    . aspirin EC 81 MG tablet Take 81 mg by mouth daily.    . cholecalciferol (VITAMIN D) 1000 UNITS tablet Take 1,000 Units by mouth daily.      . fexofenadine (ALLEGRA) 180 MG tablet Take 180 mg by mouth daily as needed. For allergies    . glycopyrrolate (ROBINUL) 2 MG tablet Take 2 mg by mouth 2 (two) times daily as needed (IBS).     . hydrochlorothiazide (HYDRODIURIL) 25 MG tablet Take 0.5 tablets (12.5 mg total) by mouth daily. 45 tablet 0  . lipase/protease/amylase (CREON) 36000 UNITS CPEP capsule Take 3 with meals and 2 with snacks. 300 capsule 11  . meclizine (ANTIVERT) 25 MG tablet Take 25 mg by mouth at bedtime.     . Melatonin 3 MG TABS Take 1 tablet by mouth at bedtime. For sleep    . Omega-3 Fatty Acids (OMEGA-3 FISH  OIL) 1200 MG CAPS Take 1 capsule by mouth daily.      . ondansetron (ZOFRAN) 4 MG tablet Take 1 tablet (4 mg total) by mouth every 6 (six) hours as needed for nausea or vomiting. 12 tablet 0  . oxyCODONE-acetaminophen (PERCOCET) 5-325 MG tablet Take 1-2 tablets by mouth every 4 (four) hours as needed. 20 tablet 0  . simvastatin (ZOCOR) 20 MG tablet Take 20 mg by mouth every evening.    . tamsulosin (FLOMAX) 0.4 MG CAPS capsule Take 1 capsule (0.4 mg total) by mouth daily. 7 capsule 0  . terazosin (HYTRIN) 5 MG capsule Take 10 mg by mouth at bedtime.     . vitamin C (ASCORBIC ACID) 500 MG tablet Take 1,000 mg by mouth daily.      No current facility-administered medications for this visit.      Past Surgical History:  Procedure Laterality Date  . ANKLE FRACTURE SURGERY     left  . CATARACT EXTRACTION W/PHACO Right 12/19/2013   Procedure: CATARACT EXTRACTION PHACO AND  INTRAOCULAR LENS PLACEMENT RIGHT EYE CDE=14.78;  Surgeon: Tonny , MD;  Location: [facility] ORS;  Service: Ophthalmology;  Laterality: Right;  . CHOLECYSTECTOMY    . COLONOSCOPY  2006   Dr. Ward Givens mucosa throughout colon, moderate internal hemorrhoids. bx= benign colonic mucosa  . COLONOSCOPY  12/03/2011   Dr. Gala Romney- colonic diverticulosis, radiation-induced proctitis- s/p APC ablation, no microscopic colitis on bx  . ESOPHAGOGASTRODUODENOSCOPY  2006   Dr. Oletta Lamas- small hiatal hernia and slightly reddened distal esophagus o/w normal  . FLEXIBLE SIGMOIDOSCOPY N/A 07/13/2013   [id number] changes of the rectum with active oozing consistent with chronic radiation proctitis.  Otherwise, negative sigmoidoscopy to 40 cm.Status post argon plasma coagulation ablation   . HOT HEMOSTASIS N/A 07/13/2013   Procedure: HOT HEMOSTASIS (ARGON PLASMA COAGULATION/BICAP);  Surgeon: Daneil Dolin, MD;  Location: [facility] END[facility] SUITE;  Service: Endoscopy;  Laterality: N/A;  . Ileocolonoscopy  12/03/2011   WLN:[id number] diverticulosis. Radiation-induced proctitis status post APC ablation. Status post segmental colon Biopsy  . TONSILLECTOMY       Allergies  Allergen Reactions  . Norvasc [Amlodipine Besylate] Swelling    Ankle swelling  . Codeine Nausea And Vomiting      Family History  Problem Relation Age of Onset  . Heart failure Mother   . Heart attack Father   . Heart failure Brother        bladder cancer (agent orange exposure)  . Cirrhosis Brother        chronic viral hepatitis  . Colon cancer Neg Hx      Social History Mr. Tosi reports that he quit smoking about 20 years ago. He has a 60.00 pack-year smoking history. He has never used smokeless tobacco. Mr. Roes reports no history of alcohol use.   Review of Systems CONSTITUTIONAL: No weight loss, fever, chills, weakness or fatigue.  HEENT: Eyes: No visual loss, blurred vision, double vision or yellow sclerae.No hearing loss,  sneezing, congestion, runny nose or sore throat.  SKIN: No rash or itching.  CARDIOVASCULAR: per hpi RESPIRATORY: No shortness of breath, cough or sputum.  GASTROINTESTINAL: No anorexia, nausea, vomiting or diarrhea. No abdominal pain or blood.  GENITOURINARY: No burning on urination, no polyuria NEUROLOGICAL: No headache, dizziness, syncope, paralysis, ataxia, numbness or tingling in the extremities. No change in bowel or bladder control.  MUSCULOSKELETAL: No muscle, back pain, joint pain or stiffness.  LYMPHATICS: No enlarged nodes. No history of splenectomy.  PSYCHIATRIC: No history  of depression or anxiety.  ENDOCRINOLOGIC: No reports of sweating, cold or heat intolerance. No polyuria or polydipsia.  Marland Kitchen   Physical Examination Vitals:   07/22/18 1118  BP: 134/72  Pulse: (!) 101  SpO2: 96%   Vitals:   07/22/18 1118  Weight: 177 lb 6.4 oz (80.5 kg)  Height: 5\' 6"  (1.676 m)    Gen: resting comfortably, no acute distress HEENT: no scleral icterus, pupils equal round and reactive, no palptable cervical adenopathy,  CV: RRR, 3/6 systolic murmur rusb, no jvd Resp: Clear to auscultation bilaterally GI: abdomen is soft, non-tender, non-distended, normal bowel sounds, no hepatosplenomegaly MSK: extremities are warm, no edema.  Skin: warm, no rash Neuro:  no focal deficits Psych: appropriate affect   Diagnostic Studies 07/2012 Echo LVEF 55-60%, no WMA, mild-mod AS (mean grad 11, AVA by planimetry 1.2, continuity area not listed)  07/2014 echo Study Conclusions  - Left ventricle: The cavity size was normal. Wall thickness was increased in a pattern of mild LVH. Systolic function was normal. The estimated ejection fraction was in the range of 60% to 65%. Wall motion was normal; there were no regional wall motion abnormalities. Doppler parameters are consistent with abnormal left ventricular relaxation (grade 1 diastolic dysfunction). - Ventricular septum: Septal  motion showed abnormal function and dyssynergy. - Aortic valve: Moderately calcified annulus. Trileaflet; moderately calcified leaflets. Cusp separation was moderately reduced. There was moderate stenosis. There was mild regurgitation. Mean gradient (S): 18 mm Hg. Peak gradient (S): 32 mm Hg. VTI ratio of LVOT to aortic valve: 0.43. Valve area (VTI): 1.25 cm^2. Planimetry valve area 1.5 cm2. - Mitral valve: Moderately calcified annulus. There was trivial regurgitation. - Left atrium: The atrium was mildly dilated. - Right atrium: Central venous pressure (est): 3 mm Hg. - Atrial septum: No defect or patent foramen ovale was identified. - Tricuspid valve: There was trivial regurgitation. - Pulmonary arteries: Systolic pressure could not be accurately estimated. - Pericardium, extracardiac: There was no pericardial effusion.   07/2016 echo Study Conclusions  - Left ventricle: The cavity size was normal. Wall thickness was increased in a pattern of mild LVH. Systolic function was normal. The estimated ejection fraction was in the range of 60% to 65%. Wall motion was normal; there were no regional wall motion abnormalities. - Aortic valve: Moderately calcified annulus. Moderately calcified leaflets. There was mild regurgitation. Mean gradient (S): 18 mm Hg. Peak gradient (S): 36 mm Hg. VTI ratio of LVOT to aortic valve: 0.38. Valve area (VTI): 0.77 cm^2. Somewhat discordant information in grading degree of aortic stenosis. Gradients suggest mild range, however valve appearance and LVOT to AV ratios are more consistent with moderate to severe aortic stenosis. - Mitral valve: Moderately calcified annulus. There was trivial regurgitation. - Right atrium: Central venous pressure (est): 3 mm Hg. - Atrial septum: No defect or patent foramen ovale was identified. - Tricuspid valve: There was trivial regurgitation. - Pulmonary arteries: Systolic  pressure could not be accurately estimated. - Pericardium, extracardiac: A prominent pericardial fat pad was present.  Impressions:  - Mild LVH with LVEF 60-65%. Grade 2 diastolic dysfunction suspected. Aortic valve is moderately calcified with at least moderate calcific aortic stenosis as detailed above, information is somewhat discordant however. Mild aortic regurgitation. Moderate mitral annular calcification with trivial mitral regurgitation. Trivial tricuspid regurgitation.  06/2017 echo Study Conclusions  - Left ventricle: The cavity size was normal. There was severe concentric hypertrophy. Systolic function was normal. The estimated ejection fraction was in the range of  60% to 65%. Wall motion was normal; there were no regional wall motion abnormalities. Features are consistent with a pseudonormal left ventricular filling pattern, with concomitant abnormal relaxation and increased filling pressure (grade 2 diastolic dysfunction). Doppler parameters are consistent with high ventricular filling pressure. - Ventricular septum: Septal motion showed abnormal function and dyssynergy. 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 - Aortic valve: Moderately to severely calcified annulus. Trileaflet; moderately calcified leaflets. There was moderate to severe stenosis. There was mild regurgitation. Peak velocity (S): 325 cm/s. Mean gradient (S): 22 mm Hg. Valve area (VTI): 1 cm^2. Valve area (Vmax): 0.9 cm^2. Valve area (Vmean): 0.99 cm^2. - Mitral valve: Moderately calcified annulus. - Right ventricle: Systolic function was mildly reduced. - Atrial septum: No defect or patent foramen ovale was identified.    Assessment and Plan   1. Aortic stenosis - moderate by majority of criteria, asymptomatic. - we will repeat his echo to look for any progression  2. HTN - essentially at goal, continue current meds  3.  Hyperlipidemia - request labs from pcp, continue statin   F/u 6 months   Arnoldo Lenis, M.D.

## 2018-07-26 ENCOUNTER — Other Ambulatory Visit (HOSPITAL_COMMUNITY): Payer: Self-pay | Admitting: Urology

## 2018-07-26 DIAGNOSIS — N200 Calculus of kidney: Secondary | ICD-10-CM

## 2018-07-29 ENCOUNTER — Ambulatory Visit (HOSPITAL_COMMUNITY)
Admission: RE | Admit: 2018-07-29 | Discharge: 2018-07-29 | Disposition: A | Discharge status: Home / Self Care | Payer: Medicare Other | Source: Ambulatory Visit | Attending: Urology | Admitting: Urology

## 2018-07-29 ENCOUNTER — Ambulatory Visit (HOSPITAL_BASED_OUTPATIENT_CLINIC_OR_DEPARTMENT_OTHER)
Admission: RE | Admit: 2018-07-29 | Discharge: 2018-07-29 | Disposition: A | Discharge status: Home / Self Care | Payer: Medicare Other | Source: Ambulatory Visit | Attending: Cardiology | Admitting: Cardiology

## 2018-07-29 ENCOUNTER — Other Ambulatory Visit: Payer: Self-pay

## 2018-07-29 DIAGNOSIS — N200 Calculus of kidney: Secondary | ICD-10-CM | POA: Diagnosis present

## 2018-07-29 DIAGNOSIS — I350 Nonrheumatic aortic (valve) stenosis: Secondary | ICD-10-CM

## 2018-07-29 NOTE — Progress Notes (Signed)
*  PRELIMINARY RESULTS* Echocardiogram 2D Echocardiogram has been performed.  Calvin Moreno 07/29/2018, 12:54 PM

## 2018-08-03 ENCOUNTER — Telehealth: Payer: Self-pay

## 2018-08-03 NOTE — Telephone Encounter (Signed)
-----   Message from Arnoldo Lenis, MD sent at 08/03/2018 12:40 PM EDT ----- Echo shows aortic valve is in the moderate to severe range, we will continue to monitor at this time   J BrancH MD

## 2018-08-03 NOTE — Telephone Encounter (Signed)
Called pt. No answer, left message for pt to return call.  

## 2018-10-27 ENCOUNTER — Ambulatory Visit (INDEPENDENT_AMBULATORY_CARE_PROVIDER_SITE_OTHER): Payer: Medicare Other | Admitting: Urology

## 2018-10-27 DIAGNOSIS — R3912 Poor urinary stream: Secondary | ICD-10-CM | POA: Diagnosis not present

## 2018-10-27 DIAGNOSIS — N201 Calculus of ureter: Secondary | ICD-10-CM

## 2018-11-17 ENCOUNTER — Other Ambulatory Visit: Payer: Self-pay

## 2018-11-17 ENCOUNTER — Other Ambulatory Visit (HOSPITAL_COMMUNITY): Payer: Self-pay | Admitting: Family Medicine

## 2018-11-17 ENCOUNTER — Ambulatory Visit (HOSPITAL_COMMUNITY)
Admission: RE | Admit: 2018-11-17 | Discharge: 2018-11-17 | Disposition: A | Discharge status: Home / Self Care | Payer: Medicare Other | Source: Ambulatory Visit | Attending: Family Medicine | Admitting: Family Medicine

## 2018-11-17 DIAGNOSIS — M79672 Pain in left foot: Secondary | ICD-10-CM | POA: Diagnosis present

## 2018-11-17 DIAGNOSIS — M25572 Pain in left ankle and joints of left foot: Secondary | ICD-10-CM

## 2018-12-07 ENCOUNTER — Other Ambulatory Visit (HOSPITAL_COMMUNITY): Payer: Self-pay | Admitting: Family Medicine

## 2018-12-07 ENCOUNTER — Ambulatory Visit (HOSPITAL_COMMUNITY)
Admission: RE | Admit: 2018-12-07 | Discharge: 2018-12-07 | Disposition: A | Discharge status: Home / Self Care | Payer: Medicare Other | Source: Ambulatory Visit | Attending: Family Medicine | Admitting: Family Medicine

## 2018-12-07 ENCOUNTER — Other Ambulatory Visit: Payer: Self-pay

## 2018-12-07 DIAGNOSIS — R200 Anesthesia of skin: Secondary | ICD-10-CM

## 2018-12-07 DIAGNOSIS — R208 Other disturbances of skin sensation: Secondary | ICD-10-CM | POA: Insufficient documentation

## 2019-01-26 ENCOUNTER — Ambulatory Visit: Payer: Medicare Other | Admitting: Urology

## 2019-02-02 ENCOUNTER — Encounter: Payer: Self-pay | Admitting: Cardiology

## 2019-02-02 ENCOUNTER — Telehealth: Payer: Self-pay

## 2019-02-02 ENCOUNTER — Ambulatory Visit (INDEPENDENT_AMBULATORY_CARE_PROVIDER_SITE_OTHER): Payer: Medicare Other | Admitting: Cardiology

## 2019-02-02 ENCOUNTER — Other Ambulatory Visit: Payer: Self-pay

## 2019-02-02 VITALS — BP 117/63 | HR 103 | Ht 65.5 in | Wt 174.3 lb

## 2019-02-02 DIAGNOSIS — I350 Nonrheumatic aortic (valve) stenosis: Secondary | ICD-10-CM

## 2019-02-02 DIAGNOSIS — I10 Essential (primary) hypertension: Secondary | ICD-10-CM

## 2019-02-02 DIAGNOSIS — E782 Mixed hyperlipidemia: Secondary | ICD-10-CM | POA: Diagnosis not present

## 2019-02-02 NOTE — Patient Instructions (Signed)
Medication Instructions:  Your physician recommends that you continue on your current medications as directed. Please refer to the Current Medication list given to you today.   Labwork: I will request labs from pcp   Testing/Procedures: None  Follow-Up: Your physician wants you to follow-up in: 6 months You will receive a reminder letter in the mail two months in advance. If you don't receive a letter, please call our office to schedule the follow-up appointment.   Any Other Special Instructions Will Be Listed Below (If Applicable).     If you need a refill on your cardiac medications before your next appointment, please call your pharmacy.   

## 2019-02-02 NOTE — Telephone Encounter (Signed)

## 2019-02-02 NOTE — Progress Notes (Signed)
Virtual Visit via Telephone Note   This visit type was conducted due to national recommendations for restrictions regarding the COVID-19 Pandemic (e.g. social distancing) in an effort to limit this patient's exposure and mitigate transmission in our community.  Due to his co-morbid illnesses, this patient is at least at moderate risk for complications without adequate follow up.  This format is felt to be most appropriate for this patient at this time.  The patient did not have access to video technology/had technical difficulties with video requiring transitioning to audio format only (telephone).  All issues noted in this document were discussed and addressed.  No physical exam could be performed with this format.  Please refer to the patient's chart for his  consent to telehealth for [facility].   Date:  02/02/2019   ID:  Calvin Moreno, DOB January 02, 1941, MRN [id number]  Patient Location: Home Provider Location: Office  PCP:  Lemmie Evens, MD  Cardiologist:  Carlyle Dolly, MD  Electrophysiologist:  None   Evaluation Performed:  Follow-Up Visit  Chief Complaint:  Follow up  History of Present Illness:    Calvin Moreno is a 78 y.o. male seen today for follow up of lthe following medical problems.  1. Aortic stenosis - 06/2017 echo LVEF 60-65%, grade II diastolic dysfunction, moderate to severe AS per report (mean grad 22, AVA VTI 1, dimensionless index 0.35). Criteria would suggest more moderate AS  - 07/2018 echo :LVEF >65%, mean grad 25, AVA VTI 0.65, dimensionless index 0.33, SVI  - continues to deny symptoms. Remains active, doing yard work which he tolerates without troubles.   2. HTN -remains compliant with meds  3. Hyperlipidemia - reports side effects on other statins in the past. Has done well on simva 20. -he reports most recent labs with pcp   4. Sinus tachcyardia Patient with frequent elevated heart rates in clinic. Checks heart rates regularly at  home, typically in low 90s. Often has anxiety in the clinic - no recent symptoms.  - denies any palpitations.    5. Chronic LBBB    The patient does not have symptoms concerning for COVID-19 infection (fever, chills, cough, or new shortness of breath).    Past Medical History:  Diagnosis Date  . Aortic stenosis, mild 2000   And insufficiency, evaluation by Dr. Verl Blalock in 2000  . Arthritis   . Benign prostatic hypertrophy    PSA of 1.39 in 09/2010  . Chronic kidney disease    Borderline; creatinine of 1.6 in 08/2009, but 1.15 in 05/2010  . Chronic sinusitis    By MRI  . Diverticulosis 2013  . Hearing impaired   . Hemorrhoids 2006  . Hiatal hernia 2006  . Hyperlipidemia    Elevated triglycerides  . Hypertension    Mild internal carotid artery plaque on MRI/MRA in 2002  . IBS (irritable bowel syndrome)   . Insomnia   . Left bundle Sarabeth Benton block 2012   2012  . Metabolic syndrome    Fasting hyperglycemia  . Murmur   . Nephrolithiasis 08/2009   2011  . Obesity   . Proctitis    radiation induced  . Prostate cancer ([place]) 11/2010   s/p XRT  . Seasonal allergies   . Stroke [facility])    TIA, no residual, seen on MRI  . 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 Uses hearing aid    bilateral   Past Surgical History:  Procedure Laterality  Date  . ANKLE FRACTURE SURGERY     left  . CATARACT EXTRACTION W/PHACO Right 12/19/2013   Procedure: CATARACT EXTRACTION PHACO AND INTRAOCULAR LENS PLACEMENT RIGHT EYE CDE=14.78;  Surgeon: Tonny , MD;  Location: [facility];  Service: Ophthalmology;  Laterality: Right;  . CHOLECYSTECTOMY    . COLONOSCOPY  2006   Dr. Ward Givens mucosa throughout colon, moderate internal hemorrhoids. bx= benign colonic mucosa  . COLONOSCOPY  12/03/2011   Dr. Gala Romney- colonic diverticulosis, radiation-induced proctitis- s/p APC ablation, no microscopic colitis on bx  . ESOPHAGOGASTRODUODENOSCOPY  2006   Dr. Oletta Lamas- small hiatal hernia and  slightly reddened distal esophagus o/w normal  . FLEXIBLE SIGMOIDOSCOPY N/A 07/13/2013   [id number] changes of the rectum with active oozing consistent with chronic radiation proctitis.  Otherwise, negative sigmoidoscopy to 40 cm.Status post argon plasma coagulation ablation   . HOT HEMOSTASIS N/A 07/13/2013   Procedure: HOT HEMOSTASIS (ARGON PLASMA COAGULATION/BICAP);  Surgeon: Daneil Dolin, MD;  Location: [facility] SUITE;  Service: Endoscopy;  Laterality: N/A;  . Ileocolonoscopy  12/03/2011   [id number] diverticulosis. Radiation-induced proctitis status post APC ablation. Status post segmental colon Biopsy  . TONSILLECTOMY       Current Meds  Medication Sig  . acyclovir (ZOVIRAX) 800 MG tablet Take 800 mg by mouth 3 (three) times daily.   Marland Kitchen ALPRAZolam (XANAX) 0.5 MG tablet Take 0.5 mg by mouth at bedtime as needed. For sleep  . aspirin EC 81 MG tablet Take 81 mg by mouth daily.  . cholecalciferol (VITAMIN D) 1000 UNITS tablet Take 1,000 Units by mouth daily.    . fexofenadine (ALLEGRA) 180 MG tablet Take 180 mg by mouth daily as needed. For allergies  . gabapentin (NEURONTIN) 100 MG capsule   . hydrochlorothiazide (HYDRODIURIL) 25 MG tablet Take 0.5 tablets (12.5 mg total) by mouth daily.  . meclizine (ANTIVERT) 25 MG tablet Take 25 mg by mouth at bedtime.   . Melatonin 3 MG TABS Take 1 tablet by mouth at bedtime. For sleep  . Omega-3 Fatty Acids (OMEGA-3 FISH OIL) 1200 MG CAPS Take 1 capsule by mouth daily.    . pantoprazole (PROTONIX) 40 MG tablet Take 40 mg by mouth as needed.   . simvastatin (ZOCOR) 20 MG tablet Take 20 mg by mouth every evening.  . terazosin (HYTRIN) 5 MG capsule Take 10 mg by mouth at bedtime.   . vitamin C (ASCORBIC ACID) 500 MG tablet Take 1,000 mg by mouth daily.   . [DISCONTINUED] terazosin (HYTRIN) 10 MG capsule      Allergies:   Norvasc [amlodipine besylate] and Codeine   Social History   Tobacco Use  . Smoking status: Former Smoker    Packs/day:  1.50    Years: 40.00    Pack years: 60.00    Quit date: 11/04/1997    Years since quitting: 21.2  . Smokeless tobacco: Never Used  . Tobacco comment: Quit in 1999  Substance Use Topics  . Alcohol use: No    Alcohol/week: 0.0 standard drinks  . Drug use: No     Family Hx: The patient's family history includes Cirrhosis in his brother; Heart attack in his father; Heart failure in his brother and mother. There is no history of Colon cancer.  ROS:   Please see the history of present illness.     All other systems reviewed and are negative.   Prior CV studies:   The following studies were reviewed today:  07/2012 Echo LVEF 55-60%, no WMA, mild-mod  AS (mean grad 11, AVA by planimetry 1.2, continuity area not listed)  07/2014 echo Study Conclusions  - Left ventricle: The cavity size was normal. Wall thickness was increased in a pattern of mild LVH. Systolic function was normal. The estimated ejection fraction was in the range of 60% to 65%. Wall motion was normal; there were no regional wall motion abnormalities. Doppler parameters are consistent with abnormal left ventricular relaxation (grade 1 diastolic dysfunction). - Ventricular septum: Septal motion showed abnormal function and dyssynergy. - Aortic valve: Moderately calcified annulus. Trileaflet; moderately calcified leaflets. Cusp separation was moderately reduced. There was moderate stenosis. There was mild regurgitation. Mean gradient (S): 18 mm Hg. Peak gradient (S): 32 mm Hg. VTI ratio of LVOT to aortic valve: 0.43. Valve area (VTI): 1.25 cm^2. Planimetry valve area 1.5 cm2. - Mitral valve: Moderately calcified annulus. There was trivial regurgitation. - Left atrium: The atrium was mildly dilated. - Right atrium: Central venous pressure (est): 3 mm Hg. - Atrial septum: No defect or patent foramen ovale was identified. - Tricuspid valve: There was trivial regurgitation. - Pulmonary arteries:  Systolic pressure could not be accurately estimated. - Pericardium, extracardiac: There was no pericardial effusion.   07/2016 echo Study Conclusions  - Left ventricle: The cavity size was normal. Wall thickness was increased in a pattern of mild LVH. Systolic function was normal. The estimated ejection fraction was in the range of 60% to 65%. Wall motion was normal; there were no regional wall motion abnormalities. - Aortic valve: Moderately calcified annulus. Moderately calcified leaflets. There was mild regurgitation. Mean gradient (S): 18 mm Hg. Peak gradient (S): 36 mm Hg. VTI ratio of LVOT to aortic valve: 0.38. Valve area (VTI): 0.77 cm^2. Somewhat discordant information in grading degree of aortic stenosis. Gradients suggest mild range, however valve appearance and LVOT to AV ratios are more consistent with moderate to severe aortic stenosis. - Mitral valve: Moderately calcified annulus. There was trivial regurgitation. - Right atrium: Central venous pressure (est): 3 mm Hg. - Atrial septum: No defect or patent foramen ovale was identified. - Tricuspid valve: There was trivial regurgitation. - Pulmonary arteries: Systolic pressure could not be accurately estimated. - Pericardium, extracardiac: A prominent pericardial fat pad was present.  Impressions:  - Mild LVH with LVEF 60-65%. Grade 2 diastolic dysfunction suspected. Aortic valve is moderately calcified with at least moderate calcific aortic stenosis as detailed above, information is somewhat discordant however. Mild aortic regurgitation. Moderate mitral annular calcification with trivial mitral regurgitation. Trivial tricuspid regurgitation.  06/2017 echo Study Conclusions  - Left ventricle: The cavity size was normal. There was severe concentric hypertrophy. Systolic function was normal. The estimated ejection fraction was in the range of 60% to 65%. Wall  motion was normal; there were no regional wall motion abnormalities. Features are consistent with a pseudonormal left ventricular filling pattern, with concomitant abnormal relaxation and increased filling pressure (grade 2 diastolic dysfunction). Doppler parameters are consistent with high ventricular filling pressure. - Ventricular septum: Septal motion showed abnormal function and dyssynergy. 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 - Aortic valve: Moderately to severely calcified annulus. Trileaflet; moderately calcified leaflets. There was moderate to severe stenosis. There was mild regurgitation. Peak velocity (S): 325 cm/s. Mean gradient (S): 22 mm Hg. Valve area (VTI): 1 cm^2. Valve area (Vmax): 0.9 cm^2. Valve area (Vmean): 0.99 cm^2. - Mitral valve: Moderately calcified annulus. - Right ventricle: Systolic function was mildly reduced. - Atrial septum: No defect or patent foramen ovale was  identified.  Labs/Other Tests and Data Reviewed:    EKG:  No ECG reviewed.  Recent Labs: 06/17/2018: BUN 14; Creatinine, Ser 1.23; Hemoglobin 15.1; Platelets 183; Potassium 3.5; Sodium 137   Recent Lipid Panel Lab Results  Component Value Date/Time   CHOL 170 06/06/2010   TRIG 177 06/06/2010   HDL 34 06/06/2010   LDLCALC 101 06/06/2010    Wt Readings from Last 3 Encounters:  02/02/19 174 lb 4.8 oz (79.1 kg)  07/22/18 177 lb 6.4 oz (80.5 kg)  06/17/18 173 lb (78.5 kg)     Objective:    Vital Signs:  BP 117/63   Pulse (!) 103   Ht 5' 5.5" (1.664 m)   Wt 174 lb 4.8 oz (79.1 kg)   BMI 28.56 kg/m    Normal affect. Normal speech pattern and tone. Comfortable, no apparent distress. No audible signs of SOB or wheezing. Comfortable, no apparent distress  ASSESSMENT & PLAN:    1. Aortic stenosis - moderate to severe by echo. Normal LVEF and asymptomatic, continue to monitor at this time  2. HTN - at goal, continue current meds   3. Hyperlipidemia - he will continue statin, request pcp labs    COVID-19 Education: The signs and symptoms of COVID-19 were discussed with the patient and how to seek care for testing (follow up with PCP or arrange E-visit).  The importance of social distancing was discussed today.  Time:   Today, I have spent 14 minutes with the patient with telehealth technology discussing the above problems.     Medication Adjustments/Labs and Tests Ordered: Current medicines are reviewed at length with the patient today.  Concerns regarding medicines are outlined above.   Tests Ordered: No orders of the defined types were placed in this encounter.   Medication Changes: No orders of the defined types were placed in this encounter.   Follow Up:  In Person in 6 month(s)  Signed, Carlyle Dolly, MD  02/02/2019 12:00 PM    [place]

## 2019-02-08 ENCOUNTER — Other Ambulatory Visit: Payer: Self-pay | Admitting: Family Medicine

## 2019-02-08 ENCOUNTER — Other Ambulatory Visit (HOSPITAL_COMMUNITY): Payer: Self-pay | Admitting: Family Medicine

## 2019-02-08 DIAGNOSIS — N400 Enlarged prostate without lower urinary tract symptoms: Secondary | ICD-10-CM

## 2019-02-08 DIAGNOSIS — G629 Polyneuropathy, unspecified: Secondary | ICD-10-CM

## 2019-02-08 DIAGNOSIS — E538 Deficiency of other specified B group vitamins: Secondary | ICD-10-CM

## 2019-02-08 DIAGNOSIS — I771 Stricture of artery: Secondary | ICD-10-CM

## 2019-02-11 ENCOUNTER — Other Ambulatory Visit: Payer: Self-pay

## 2019-02-11 ENCOUNTER — Ambulatory Visit (HOSPITAL_COMMUNITY)
Admission: RE | Admit: 2019-02-11 | Discharge: 2019-02-11 | Disposition: A | Discharge status: Home / Self Care | Payer: Medicare Other | Source: Ambulatory Visit | Attending: Family Medicine | Admitting: Family Medicine

## 2019-02-11 DIAGNOSIS — G629 Polyneuropathy, unspecified: Secondary | ICD-10-CM | POA: Diagnosis present

## 2019-02-11 DIAGNOSIS — I771 Stricture of artery: Secondary | ICD-10-CM | POA: Diagnosis not present

## 2019-02-11 DIAGNOSIS — E538 Deficiency of other specified B group vitamins: Secondary | ICD-10-CM

## 2019-02-11 DIAGNOSIS — N400 Enlarged prostate without lower urinary tract symptoms: Secondary | ICD-10-CM | POA: Diagnosis present

## 2019-03-09 ENCOUNTER — Ambulatory Visit (INDEPENDENT_AMBULATORY_CARE_PROVIDER_SITE_OTHER): Payer: Medicare Other | Admitting: Urology

## 2019-03-09 DIAGNOSIS — R3912 Poor urinary stream: Secondary | ICD-10-CM | POA: Diagnosis not present

## 2019-03-09 DIAGNOSIS — N401 Enlarged prostate with lower urinary tract symptoms: Secondary | ICD-10-CM

## 2019-05-24 ENCOUNTER — Ambulatory Visit: Payer: Medicare Other | Attending: Internal Medicine

## 2019-05-24 ENCOUNTER — Other Ambulatory Visit: Payer: Self-pay

## 2019-05-24 DIAGNOSIS — Z20822 Contact with and (suspected) exposure to covid-19: Secondary | ICD-10-CM

## 2019-05-25 LAB — NOVEL CORONAVIRUS, NAA: SARS-CoV-2, NAA: NOT DETECTED

## 2019-06-02 ENCOUNTER — Other Ambulatory Visit: Payer: Self-pay

## 2019-06-02 DIAGNOSIS — N200 Calculus of kidney: Secondary | ICD-10-CM

## 2019-06-21 ENCOUNTER — Ambulatory Visit: Payer: Medicare Other

## 2019-06-25 ENCOUNTER — Telehealth: Payer: Self-pay

## 2019-06-25 NOTE — Telephone Encounter (Signed)
Patient called stating that his wife was rescheduled for the COVID-19 vaccine but he has not heard anything. Process was explained to patient. He will wait for his notification.

## 2019-06-30 ENCOUNTER — Ambulatory Visit: Payer: Medicare Other | Attending: Internal Medicine

## 2019-06-30 DIAGNOSIS — Z23 Encounter for immunization: Secondary | ICD-10-CM | POA: Insufficient documentation

## 2019-07-25 ENCOUNTER — Ambulatory Visit: Payer: Medicare Other | Attending: Internal Medicine

## 2019-07-25 DIAGNOSIS — Z23 Encounter for immunization: Secondary | ICD-10-CM | POA: Insufficient documentation

## 2019-07-25 NOTE — Progress Notes (Signed)
   Covid-19 Vaccination Clinic  Name:  MAZIAH GLASPELL    MRN: [id number] DOB: [birth year]  07/25/2019  Mr. Poteat was observed post Covid-19 immunization for 15 minutes without incidence. He was provided with Vaccine Information Sheet and instruction to access the V-Safe system.   Mr. Munn was instructed to call 911 with any severe reactions post vaccine: Marland Kitchen Difficulty breathing  . Swelling of your face and throat  . A fast heartbeat  . A bad rash all over your body  . Dizziness and weakness    Immunizations Administered    Name Date Dose VIS Date Route   Pfizer COVID-19 Vaccine 07/25/2019  4:23 PM 0.3 mL 05/06/2019 Intramuscular   Manufacturer: [place]   Lot: [id number]   [place]: [id number]

## 2019-08-25 ENCOUNTER — Other Ambulatory Visit: Payer: Self-pay

## 2019-08-25 ENCOUNTER — Ambulatory Visit (HOSPITAL_COMMUNITY)
Admission: RE | Admit: 2019-08-25 | Discharge: 2019-08-25 | Disposition: A | Discharge status: Home / Self Care | Payer: Medicare Other | Source: Ambulatory Visit | Attending: Urology | Admitting: Urology

## 2019-08-25 DIAGNOSIS — N200 Calculus of kidney: Secondary | ICD-10-CM | POA: Insufficient documentation

## 2019-09-12 ENCOUNTER — Ambulatory Visit: Payer: Medicare Other | Admitting: Urology

## 2019-10-14 ENCOUNTER — Encounter: Payer: Self-pay | Admitting: Urology

## 2019-10-17 ENCOUNTER — Ambulatory Visit: Payer: Medicare Other | Admitting: Urology

## 2019-10-19 ENCOUNTER — Ambulatory Visit (INDEPENDENT_AMBULATORY_CARE_PROVIDER_SITE_OTHER): Payer: Medicare Other | Admitting: Urology

## 2019-10-19 ENCOUNTER — Encounter: Payer: Self-pay | Admitting: Urology

## 2019-10-19 ENCOUNTER — Other Ambulatory Visit: Payer: Self-pay

## 2019-10-19 VITALS — BP 102/49 | HR 121 | Temp 98.4°F | Ht 65.5 in | Wt 180.0 lb

## 2019-10-19 DIAGNOSIS — N200 Calculus of kidney: Secondary | ICD-10-CM | POA: Diagnosis not present

## 2019-10-19 DIAGNOSIS — R3912 Poor urinary stream: Secondary | ICD-10-CM | POA: Diagnosis not present

## 2019-10-19 DIAGNOSIS — N401 Enlarged prostate with lower urinary tract symptoms: Secondary | ICD-10-CM | POA: Diagnosis not present

## 2019-10-19 DIAGNOSIS — N138 Other obstructive and reflux uropathy: Secondary | ICD-10-CM

## 2019-10-19 LAB — POCT URINALYSIS DIPSTICK
Bilirubin, UA: NEGATIVE
Blood, UA: NEGATIVE
Glucose, UA: NEGATIVE
Ketones, UA: NEGATIVE
Leukocytes, UA: NEGATIVE
Nitrite, UA: NEGATIVE
Protein, UA: NEGATIVE
Spec Grav, UA: 1.025 (ref 1.010–1.025)
Urobilinogen, UA: NEGATIVE E.U./dL — AB
pH, UA: 5 (ref 5.0–8.0)

## 2019-10-19 MED ORDER — SILODOSIN 8 MG PO CAPS: 8.0000 mg | ORAL_CAPSULE | Freq: Every day | ORAL | 11 refills | Status: DC

## 2019-10-19 MED ORDER — FINASTERIDE 5 MG PO TABS: 5.0000 mg | ORAL_TABLET | Freq: Every day | ORAL | 3 refills | Status: DC

## 2019-10-19 NOTE — Progress Notes (Signed)
10/19/2019 3:07 PM   Calvin Moreno 19-May-1941 [id number]  Referring provider: Lemmie Evens, MD [place],  [place] [address]  Nephrolithiasis and weak stream  HPI: [name] is a 79yo here for followup for nephrolithiasis and weak urinary stream. NO stone events since last visit. Renal US showed no calculi, no hydronephrosis. He has a right lower pole simple cyst. He continues to have a weak urinary stream. He is on terazosin and finasteride which has improved his LUTS but his stream is still weak. Nocturia 1-2x. No urinary frequency. He has hesitancy. He often has to wait 15 minutes for his urine stream to start  His records from [facility] are as follows: 07/21/2018: The patient presents today with worsening stream, urinary frequency, hesitancy and urgency. He has a hx of prostate cancer and had IMRT in 2012. He was diagnosed with a kidney stone 6 weeks ago and was started on flomax. He has not noticed an improvement in his stream on the flomax.   10/27/2018: He noted no improvement in his stream with uroxatral 10g qhs.   03/09/2019: Since last visit he has switched back to terazosin and finasteride. He has nocturia 1x. Urine stream has imrpoved   CC: I have an enlarged prostate (follow-up). HPI: He is currently taking hytrin and proscar. He is not on new medications for symptoms of prostate enlargement.   He does have an abnormal sensation when needing to urinate. He is not having problems getting his urine stream started. He does have a good size and strength to his urinary stream. He is not having problems with emptying his bladder well. He does not dribble at the end of urination.   03/09/2019: He started terazosin and finasteride 2 weeks ago and notes improvement in his nocturia to 1x and an improved stream.    PMH: Past Medical History:  Diagnosis Date  . Aortic stenosis, mild 2000   And insufficiency, evaluation by Dr. Verl Blalock in 2000  . Arthritis   . Benign  prostatic hypertrophy    PSA of 1.39 in 09/2010  . Chronic kidney disease    Borderline; creatinine of 1.6 in 08/2009, but 1.15 in 05/2010  . Chronic sinusitis    By MRI  . Diverticulosis 2013  . Hearing impaired   . Hemorrhoids 2006  . Hiatal hernia 2006  . Hyperlipidemia    Elevated triglycerides  . Hypertension    Mild internal carotid artery plaque on MRI/MRA in 2002  . IBS (irritable bowel syndrome)   . Insomnia   . Left bundle branch block 2012   2012  . Metabolic syndrome    Fasting hyperglycemia  . Murmur   . Nephrolithiasis 08/2009   2011  . Obesity   . Proctitis    radiation induced  . Prostate cancer ([place]) 11/2010   s/p XRT  . Seasonal allergies   . Stroke [facility])    TIA, no residual, seen on MRI  . 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 Uses hearing aid    bilateral    Surgical History: Past Surgical History:  Procedure Laterality Date  . ANKLE FRACTURE SURGERY     left  . CATARACT EXTRACTION W/PHACO Right 12/19/2013   Procedure: CATARACT EXTRACTION PHACO AND INTRAOCULAR LENS PLACEMENT RIGHT EYE CDE=14.78;  Surgeon: Tonny Branch, MD;  Location: [facility];  Service: Ophthalmology;  Laterality: Right;  . CHOLECYSTECTOMY    . COLONOSCOPY  2006   Dr.  Edwards-normal mucosa throughout colon, moderate internal hemorrhoids. bx= benign colonic mucosa  . COLONOSCOPY  12/03/2011   Dr. Gala Romney- colonic diverticulosis, radiation-induced proctitis- s/p APC ablation, no microscopic colitis on bx  . ESOPHAGOGASTRODUODENOSCOPY  2006   Dr. Oletta Lamas- small hiatal hernia and slightly reddened distal esophagus o/w normal  . FLEXIBLE SIGMOIDOSCOPY N/A 07/13/2013   [id number] changes of the rectum with active oozing consistent with chronic radiation proctitis.  Otherwise, negative sigmoidoscopy to 40 cm.Status post argon plasma coagulation ablation   . HOT HEMOSTASIS N/A 07/13/2013   Procedure: HOT HEMOSTASIS (ARGON PLASMA COAGULATION/BICAP);  Surgeon: Daneil Dolin, MD;  Location: [facility];  Service: Endoscopy;  Laterality: N/A;  . Ileocolonoscopy  12/03/2011   [id number] diverticulosis. Radiation-induced proctitis status post APC ablation. Status post segmental colon Biopsy  . TONSILLECTOMY      Home Medications:  Allergies as of 10/19/2019      Reactions   Norvasc [amlodipine Besylate] Swelling   Ankle swelling   Codeine Nausea And Vomiting      Medication List       Accurate as of Oct 19, 2019  3:07 PM. If you have any questions, ask your nurse or doctor.        acyclovir 800 MG tablet Commonly known as: ZOVIRAX Take 800 mg by mouth 3 (three) times daily.   ALPRAZolam 0.5 MG tablet Commonly known as: XANAX Take 0.5 mg by mouth at bedtime as needed. For sleep   aspirin EC 81 MG tablet Take 81 mg by mouth daily.   cholecalciferol 25 MCG (1000 UNIT) tablet Commonly known as: VITAMIN D Take 1,000 Units by mouth daily.   clobetasol 0.05 % external solution Commonly known as: TEMOVATE APPLY TO THE SCALP TWICETDAILY AS NEEDED. ( DO NOT APPLY TO THE FACE, GROIN, UNDERARMS.)   fexofenadine 180 MG tablet Commonly known as: ALLEGRA Take 180 mg by mouth daily as needed. For allergies   finasteride 5 MG tablet Commonly known as: PROSCAR Take 5 mg by mouth daily.   gabapentin 100 MG capsule Commonly known as: NEURONTIN   hydrochlorothiazide 25 MG tablet Commonly known as: HYDRODIURIL Take 0.5 tablets (12.5 mg total) by mouth daily.   meclizine 25 MG tablet Commonly known as: ANTIVERT Take 25 mg by mouth at bedtime.   melatonin 3 MG Tabs tablet Take 1 tablet by mouth at bedtime. For sleep   Omega-3 Fish Oil 1200 MG Caps Take 1 capsule by mouth daily.   pantoprazole 40 MG tablet Commonly known as: PROTONIX Take 40 mg by mouth as needed.   simvastatin 20 MG tablet Commonly known as: ZOCOR Take 20 mg by mouth every evening.   terazosin 5 MG capsule Commonly known as: HYTRIN Take 10 mg by mouth at bedtime.    VITAMIN B 12 PO Take by mouth.   vitamin C 500 MG tablet Commonly known as: ASCORBIC ACID Take 1,000 mg by mouth daily.       Allergies:  Allergies  Allergen Reactions  . Norvasc [Amlodipine Besylate] Swelling    Ankle swelling  . Codeine Nausea And Vomiting    Family History: Family History  Problem Relation Age of Onset  . Heart failure Mother   . Heart attack Father   . Heart failure Brother        bladder cancer (agent orange exposure)  . Cirrhosis Brother        chronic viral hepatitis  . Colon cancer Neg Hx     Social History:  reports  that he quit smoking about 21 years ago. He has a 60.00 pack-year smoking history. He has never used smokeless tobacco. He reports that he does not drink alcohol or use drugs.  ROS: All other review of systems were reviewed and are negative except what is noted above in HPI  Physical Exam: BP (!) 102/49   Pulse (!) 121   Temp 98.4 F (36.9 C)   Ht 5' 5.5" (1.664 m)   Wt 180 lb (81.6 kg)   BMI 29.50 kg/m   Constitutional:  Alert and oriented, No acute distress. HEENT: [place] AT, moist mucus membranes.  Trachea midline, no masses. Cardiovascular: No clubbing, cyanosis, or edema. Respiratory: Normal respiratory effort, no increased work of breathing. GI: Abdomen is soft, nontender, nondistended, no abdominal masses GU: No CVA tenderness.  Lymph: No cervical or inguinal lymphadenopathy. Skin: No rashes, bruises or suspicious lesions. Neurologic: Grossly intact, no focal deficits, moving all 4 extremities. Psychiatric: Normal mood and affect.  Laboratory Data: Lab Results  Component Value Date   WBC 5.2 06/17/2018   HGB 15.1 06/17/2018   HCT 45.4 06/17/2018   MCV 105.3 (H) 06/17/2018   PLT 183 06/17/2018    Lab Results  Component Value Date   CREATININE 1.23 06/17/2018    No results found for: PSA  No results found for: TESTOSTERONE  Lab Results  Component Value Date   HGBA1C 5.9 06/06/2010    Urinalysis      Component Value Date/Time   COLORURINE YELLOW 06/17/2018 0626   APPEARANCEUR CLEAR 06/17/2018 0626   LABSPEC 1.023 06/17/2018 0626   PHURINE 5.0 06/17/2018 0626   GLUCOSEU NEGATIVE 06/17/2018 0626   HGBUR SMALL (A) 06/17/2018 0626   BILIRUBINUR neg 10/19/2019 1444   KETONESUR NEGATIVE 06/17/2018 0626   PROTEINUR Negative 10/19/2019 1444   PROTEINUR NEGATIVE 06/17/2018 0626   UROBILINOGEN negative (A) 10/19/2019 1444   UROBILINOGEN 0.2 08/26/2009 0930   NITRITE neg 10/19/2019 1444   NITRITE NEGATIVE 06/17/2018 0626   LEUKOCYTESUR Negative 10/19/2019 1444    Lab Results  Component Value Date   BACTERIA RARE (A) 06/17/2018    Pertinent Imaging: Renal US 4/1: images reviewed and disucssed with the patient No results found for this or any previous visit. No results found for this or any previous visit. No results found for this or any previous visit. No results found for this or any previous visit. Results for orders placed during the hospital encounter of 08/25/19  Ultrasound renal complete   Narrative CLINICAL DATA:  Chronic renal disease  EXAM: RENAL / URINARY TRACT ULTRASOUND COMPLETE  COMPARISON:  07/29/2018  FINDINGS: Right Kidney:  Renal measurements: 11.4 x 4.5 x 5.0 cm. = volume: 133 mL. 4.4 cm cyst is noted within the lower pole of the right kidney. It is exophytic in nature.  Left Kidney:  Renal measurements: 11.8 x 5.1 x 5.3 cm. = volume: 166 mL. Echogenicity within normal limits. No mass or hydronephrosis visualized.  Bladder:  Partially distended  Other:  Increased echogenicity of the liver is noted likely related to fatty infiltration.  IMPRESSION: Large exophytic left renal cyst stable from prior CT examination.  No mass lesion or hydronephrosis is noted.  Fatty liver.   Electronically Signed   By: Inez Catalina M.D.   On: 08/25/2019 20:31    No results found for this or any previous visit. No results found for this or any previous  visit. Results for orders placed during the hospital encounter of 06/17/18  CT RENAL  STONE STUDY   Narrative CLINICAL DATA:  Right flank pain starting this morning with nausea and vomiting.  EXAM: CT ABDOMEN AND PELVIS WITHOUT CONTRAST  TECHNIQUE: Multidetector CT imaging of the abdomen and pelvis was performed following the standard protocol without IV contrast.  COMPARISON:  08/26/2009  FINDINGS: Lower chest: Mitral annular calcification. Coronary atherosclerosis. No acute finding  Hepatobiliary: No focal liver abnormality.Cholecystectomy with normal common bile duct diameter.  Pancreas: Unremarkable.  Spleen: Unremarkable.  Adrenals/Urinary Tract: Negative adrenals. Mild right hydroureteronephrosis secondary to a 3 mm stone at the iliac vessel crossing. No additional urolithiasis. Chronic and nonspecific perinephric stranding on both sides. Exophytic right lower pole renal cyst. Prominent bladder wall thickness but under distended.  Stomach/Bowel:  No obstruction. No evident bowel inflammation.  Vascular/Lymphatic: Atherosclerotic calcification of the aorta and iliacs. No mass or adenopathy.  Reproductive:Prostatic calcifications and right-sided fiducial markers.  Other: No ascites or pneumoperitoneum.  Musculoskeletal: No acute abnormalities.  Osteopenia.  IMPRESSION: Mild right hydroureteronephrosis due to a 3 mm stone at the level of the iliac crossing.   Electronically Signed   By: Monte Fantasia M.D.   On: 06/17/2018 07:09     Assessment & Plan:    1. Calculus, renal -RTC 1 year with renal US - POCT urinalysis dipstick  2. Benign prostatic hyperplasia with urinary obstruction -rapaflo 8mg  daily, stop terazosin. Continue finasteride  3. Weak urinary stream -rapaflo 8g daily, stop terazosin. Continue finasteride   No follow-ups on file.  Nicolette Bang, MD  [facility] [place]

## 2019-10-19 NOTE — Patient Instructions (Signed)

## 2019-10-19 NOTE — Progress Notes (Signed)
Urological Symptom Review  Patient is experiencing the following symptoms: Frequent urination Get up at night to urinate Trouble starting stream Weak stream   Review of Systems  Gastrointestinal (upper)  : Negative for upper GI symptoms  Gastrointestinal (lower) : Diarrhea  Constitutional : Night Sweats  Skin: Negative for skin symptoms  Eyes: Negative for eye symptoms  Ear/Nose/Throat : Negative for Ear/Nose/Throat symptoms  Hematologic/Lymphatic: Negative for Hematologic/Lymphatic symptoms  Cardiovascular : Leg swelling  Respiratory : Negative for respiratory symptoms  Endocrine: Negative for endocrine symptoms  Musculoskeletal: Negative for musculoskeletal symptoms  Neurological: Negative for neurological symptoms  Psychologic: Negative for psychiatric symptoms

## 2019-10-20 ENCOUNTER — Telehealth: Payer: Self-pay

## 2019-10-20 NOTE — Telephone Encounter (Signed)
Pt notified by My chart

## 2020-01-18 ENCOUNTER — Other Ambulatory Visit: Payer: Self-pay

## 2020-01-18 ENCOUNTER — Other Ambulatory Visit: Payer: Self-pay | Admitting: *Deleted

## 2020-01-18 ENCOUNTER — Ambulatory Visit (INDEPENDENT_AMBULATORY_CARE_PROVIDER_SITE_OTHER): Payer: Medicare Other | Admitting: Cardiology

## 2020-01-18 ENCOUNTER — Telehealth: Payer: Self-pay | Admitting: *Deleted

## 2020-01-18 VITALS — BP 138/64 | HR 98 | Ht 65.5 in | Wt 182.0 lb

## 2020-01-18 DIAGNOSIS — I10 Essential (primary) hypertension: Secondary | ICD-10-CM | POA: Diagnosis not present

## 2020-01-18 DIAGNOSIS — I350 Nonrheumatic aortic (valve) stenosis: Secondary | ICD-10-CM

## 2020-01-18 DIAGNOSIS — E782 Mixed hyperlipidemia: Secondary | ICD-10-CM | POA: Diagnosis not present

## 2020-01-18 NOTE — Patient Instructions (Signed)
Medication Instructions:   Your physician recommends that you continue on your current medications as directed. Please refer to the Current Medication list given to you today.   *If you need a refill on your cardiac medications before your next appointment, please call your pharmacy*   Lab Work: [place]   If you have labs (blood work) drawn today and your tests are completely normal, you will receive your results only by: Marland Kitchen MyChart Message (if you have MyChart) OR . A paper copy in the mail If you have any lab test that is abnormal or we need to change your treatment, we will call you to review the results.   Testing/Procedures: Your physician has requested that you have an echocardiogram. Echocardiography is a painless test that uses sound waves to create images of your heart. It provides your doctor with information about the size and shape of your heart and how well your heart's chambers and valves are working. This procedure takes approximately one hour. There are no restrictions for this procedure.    Follow-Up: At [facility], you and your health needs are our priority.  As part of our continuing mission to provide you with exceptional heart care, we have created designated Provider Care Teams.  These Care Teams include your primary Cardiologist (physician) and Advanced Practice Providers (APPs -  Physician Assistants and Nurse Practitioners) who all work together to provide you with the care you need, when you need it.  We recommend signing up for the patient portal called "MyChart".  Sign up information is provided on this After Visit Summary.  MyChart is used to connect with patients for Virtual Visits (Telemedicine).  Patients are able to view lab/test results, encounter notes, upcoming appointments, etc.  Non-urgent messages can be sent to your provider as well.   To learn more about what you can do with MyChart, go to [email or web address].    Your next  appointment:   1 year(s)  The format for your next appointment:   In Person  Provider:   You may see Carlyle Dolly, MD or one of the following Advanced Practice Providers on your designated Care Team:    Bernerd Pho, PA-C   Ermalinda Barrios, [place]     Other Instructions

## 2020-01-18 NOTE — Telephone Encounter (Signed)
Requested last lab work  drawn

## 2020-01-18 NOTE — Progress Notes (Signed)
Clinical Summary Mr. Damian is a 79 y.o.male  seen today for follow up of lthe following medical problems.  1. Aortic stenosis - 06/2017 echo LVEF 60-65%, grade II diastolic dysfunction, moderate to severe AS per report (mean grad 22, AVA VTI 1, dimensionless index 0.35). Criteria would suggest more moderate AS  - 07/2018 echo :LVEF >65%, mean grad 25, AVA VTI 0.65, dimensionless index 0.33, SVI    - no recent SOB/DOE, no chest pains. 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    2. HTN -he is compliant with meds  3. Hyperlipidemia - reports side effects on other statins in the past. Has done well on simva 20. Labs followed by pcp   4. Sinus tachcyardia Patient with frequent elevated heart rates in clinic. Checks heart rates regularly at home, typically in low 90s. Often has anxiety in the clinic    5. Chronic LBBB    Past Medical History:  Diagnosis Date  . Aortic stenosis, mild 2000   And insufficiency, evaluation by Dr. Verl Blalock in 2000  . Arthritis   . Benign prostatic hypertrophy    PSA of 1.39 in 09/2010  . Chronic kidney disease    Borderline; creatinine of 1.6 in 08/2009, but 1.15 in 05/2010  . Chronic sinusitis    By MRI  . Diverticulosis 2013  . Hearing impaired   . Hemorrhoids 2006  . Hiatal hernia 2006  . Hyperlipidemia    Elevated triglycerides  . Hypertension    Mild internal carotid artery plaque on MRI/MRA in 2002  . IBS (irritable bowel syndrome)   . Insomnia   . Left bundle Akya Fiorello block 2012   2012  . Metabolic syndrome    Fasting hyperglycemia  . Murmur   . Nephrolithiasis 08/2009   2011  . Obesity   . Proctitis    radiation induced  . Prostate cancer ([place]) 11/2010   s/p XRT  . Seasonal allergies   . Stroke [facility])    TIA, no residual, seen on MRI  . 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 Uses hearing aid    bilateral     Allergies  Allergen Reactions  . Norvasc [Amlodipine Besylate] Swelling     Ankle swelling  . Codeine Nausea And Vomiting     Current Outpatient Medications  Medication Sig Dispense Refill  . acyclovir (ZOVIRAX) 800 MG tablet Take 800 mg by mouth 3 (three) times daily.     Marland Kitchen ALPRAZolam (XANAX) 0.5 MG tablet Take 0.5 mg by mouth at bedtime as needed. For sleep    . aspirin EC 81 MG tablet Take 81 mg by mouth daily.    . cholecalciferol (VITAMIN D) 1000 UNITS tablet Take 1,000 Units by mouth daily.      . clobetasol (TEMOVATE) 0.05 % external solution APPLY TO THE SCALP TWICETDAILY AS NEEDED. ( DO NOT APPLY TO THE FACE, GROIN, UNDERARMS.)    . Cyanocobalamin (VITAMIN B 12 PO) Take by mouth.    . fexofenadine (ALLEGRA) 180 MG tablet Take 180 mg by mouth daily as needed. For allergies    . finasteride (PROSCAR) 5 MG tablet Take 1 tablet (5 mg total) by mouth daily. 90 tablet 3  . gabapentin (NEURONTIN) 100 MG capsule     . hydrochlorothiazide (HYDRODIURIL) 25 MG tablet Take 0.5 tablets (12.5 mg total) by mouth daily. 45 tablet 0  . meclizine (ANTIVERT) 25 MG tablet Take 25 mg by mouth  at bedtime.     . Melatonin 3 MG TABS Take 1 tablet by mouth at bedtime. For sleep    . Omega-3 Fatty Acids (OMEGA-3 FISH OIL) 1200 MG CAPS Take 1 capsule by mouth daily.      . pantoprazole (PROTONIX) 40 MG tablet Take 40 mg by mouth as needed.     . silodosin (RAPAFLO) 8 MG CAPS capsule Take 1 capsule (8 mg total) by mouth daily with breakfast. 30 capsule 11  . simvastatin (ZOCOR) 20 MG tablet Take 20 mg by mouth every evening.    . terazosin (HYTRIN) 5 MG capsule Take 10 mg by mouth at bedtime.     . vitamin C (ASCORBIC ACID) 500 MG tablet Take 1,000 mg by mouth daily.      No current facility-administered medications for this visit.     Past Surgical History:  Procedure Laterality Date  . ANKLE FRACTURE SURGERY     left  . CATARACT EXTRACTION W/PHACO Right 12/19/2013   Procedure: CATARACT EXTRACTION PHACO AND INTRAOCULAR LENS PLACEMENT RIGHT EYE CDE=14.78;  Surgeon: Tonny , MD;  Location: [facility];  Service: Ophthalmology;  Laterality: Right;  . CHOLECYSTECTOMY    . COLONOSCOPY  2006   Dr. Ward Givens mucosa throughout colon, moderate internal hemorrhoids. bx= benign colonic mucosa  . COLONOSCOPY  12/03/2011   Dr. Gala Romney- colonic diverticulosis, radiation-induced proctitis- s/p APC ablation, no microscopic colitis on bx  . ESOPHAGOGASTRODUODENOSCOPY  2006   Dr. Oletta Lamas- small hiatal hernia and slightly reddened distal esophagus o/w normal  . FLEXIBLE SIGMOIDOSCOPY N/A 07/13/2013   MPN:[id number] changes of the rectum with active oozing consistent with chronic radiation proctitis.  Otherwise, negative sigmoidoscopy to 40 cm.Status post argon plasma coagulation ablation   . HOT HEMOSTASIS N/A 07/13/2013   Procedure: HOT HEMOSTASIS (ARGON PLASMA COAGULATION/BICAP);  Surgeon: Daneil Dolin, MD;  Location: [facility]ITE;  Service: Endoscopy;  Laterality: N/A;  . Ileocolonoscopy  12/03/2011   QQP:[id number] diverticulosis. Radiation-induced proctitis status post APC ablation. Status post segmental colon Biopsy  . TONSILLECTOMY       Allergies  Allergen Reactions  . Norvasc [Amlodipine Besylate] Swelling    Ankle swelling  . Codeine Nausea And Vomiting      Family History  Problem Relation Age of Onset  . Heart failure Mother   . Heart attack Father   . Heart failure Brother        bladder cancer (agent orange exposure)  . Cirrhosis Brother        chronic viral hepatitis  . Colon cancer Neg Hx      Social History Mr. Lenn reports that he quit smoking about 22 years ago. He has a 60.00 pack-year smoking history. He has never used smokeless tobacco. Mr. Fiorella reports no history of alcohol use.   Review of Systems CONSTITUTIONAL: No weight loss, fever, chills, weakness or fatigue.  HEENT: Eyes: No visual loss, blurred vision, double vision or yellow sclerae.No hearing loss, sneezing, congestion, runny nose or sore throat.  SKIN: No rash or  itching.  CARDIOVASCULAR: per hpi RESPIRATORY: No shortness of breath, cough or sputum.  GASTROINTESTINAL: No anorexia, nausea, vomiting or diarrhea. No abdominal pain or blood.  GENITOURINARY: No burning on urination, no polyuria NEUROLOGICAL: No headache, dizziness, syncope, paralysis, ataxia, numbness or tingling in the extremities. No change in bowel or bladder control.  MUSCULOSKELETAL: No muscle, back pain, joint pain or stiffness.  LYMPHATICS: No enlarged nodes. No history of splenectomy.  PSYCHIATRIC: No history of depression  or anxiety.  ENDOCRINOLOGIC: No reports of sweating, cold or heat intolerance. No polyuria or polydipsia.  Marland Kitchen   Physical Examination Today's Vitals   01/18/20 1336  BP: 138/64  Pulse: 98  SpO2: 96%  Weight: 182 lb (82.6 kg)  Height: 5' 5.5" (1.664 m)   Body mass index is 29.83 kg/m.  Gen: resting comfortably, no acute distress HEENT: no scleral icterus, pupils equal round and reactive, no palptable cervical adenopathy,  CV: RRR, 3/6 systolic murmur rusb, no jvd Resp: Clear to auscultation bilaterally GI: abdomen is soft, non-tender, non-distended, normal bowel sounds, no hepatosplenomegaly MSK: extremities are warm, no edema.  Skin: warm, no rash Neuro:  no focal deficits Psych: appropriate affect   Diagnostic Studies 07/2012 Echo LVEF 55-60%, no WMA, mild-mod AS (mean grad 11, AVA by planimetry 1.2, continuity area not listed)  07/2014 echo Study Conclusions  - Left ventricle: The cavity size was normal. Wall thickness was increased in a pattern of mild LVH. Systolic function was normal. The estimated ejection fraction was in the range of 60% to 65%. Wall motion was normal; there were no regional wall motion abnormalities. Doppler parameters are consistent with abnormal left ventricular relaxation (grade 1 diastolic dysfunction). - Ventricular septum: Septal motion showed abnormal function and dyssynergy. - Aortic valve:  Moderately calcified annulus. Trileaflet; moderately calcified leaflets. Cusp separation was moderately reduced. There was moderate stenosis. There was mild regurgitation. Mean gradient (S): 18 mm Hg. Peak gradient (S): 32 mm Hg. VTI ratio of LVOT to aortic valve: 0.43. Valve area (VTI): 1.25 cm^2. Planimetry valve area 1.5 cm2. - Mitral valve: Moderately calcified annulus. There was trivial regurgitation. - Left atrium: The atrium was mildly dilated. - Right atrium: Central venous pressure (est): 3 mm Hg. - Atrial septum: No defect or patent foramen ovale was identified. - Tricuspid valve: There was trivial regurgitation. - Pulmonary arteries: Systolic pressure could not be accurately estimated. - Pericardium, extracardiac: There was no pericardial effusion.   07/2016 echo Study Conclusions  - Left ventricle: The cavity size was normal. Wall thickness was increased in a pattern of mild LVH. Systolic function was normal. The estimated ejection fraction was in the range of 60% to 65%. Wall motion was normal; there were no regional wall motion abnormalities. - Aortic valve: Moderately calcified annulus. Moderately calcified leaflets. There was mild regurgitation. Mean gradient (S): 18 mm Hg. Peak gradient (S): 36 mm Hg. VTI ratio of LVOT to aortic valve: 0.38. Valve area (VTI): 0.77 cm^2. Somewhat discordant information in grading degree of aortic stenosis. Gradients suggest mild range, however valve appearance and LVOT to AV ratios are more consistent with moderate to severe aortic stenosis. - Mitral valve: Moderately calcified annulus. There was trivial regurgitation. - Right atrium: Central venous pressure (est): 3 mm Hg. - Atrial septum: No defect or patent foramen ovale was identified. - Tricuspid valve: There was trivial regurgitation. - Pulmonary arteries: Systolic pressure could not be accurately estimated. - Pericardium,  extracardiac: A prominent pericardial fat pad was present.  Impressions:  - Mild LVH with LVEF 60-65%. Grade 2 diastolic dysfunction suspected. Aortic valve is moderately calcified with at least moderate calcific aortic stenosis as detailed above, information is somewhat discordant however. Mild aortic regurgitation. Moderate mitral annular calcification with trivial mitral regurgitation. Trivial tricuspid regurgitation.  06/2017 echo Study Conclusions  - Left ventricle: The cavity size was normal. There was severe concentric hypertrophy. Systolic function was normal. The estimated ejection fraction was in the range of 60% to 65%. Wall motion  was normal; there were no regional wall motion abnormalities. Features are consistent with a pseudonormal left ventricular filling pattern, with concomitant abnormal relaxation and increased filling pressure (grade 2 diastolic dysfunction). Doppler parameters are consistent with high ventricular filling pressure. - Ventricular septum: Septal motion showed abnormal function and dyssynergy. These changes are consistent with a left bundle Suann Klier block. - Aortic valve: Moderately to severely calcified annulus. Trileaflet; moderately calcified leaflets. There was moderate to severe stenosis. There was mild regurgitation. Peak velocity (S): 325 cm/s. Mean gradient (S): 22 mm Hg. Valve area (VTI): 1 cm^2. Valve area (Vmax): 0.9 cm^2. Valve area (Vmean): 0.99 cm^2. - Mitral valve: Moderately calcified annulus. - Right ventricle: Systolic function was mildly reduced. - Atrial septum: No defect or patent foramen ovale was identified.  07/2018 echo IMPRESSIONS    1. The left ventricle has hyperdynamic systolic function, with an  ejection fraction of >65%. There is moderate asymmetric septal left  ventricular hypertrophy. Diastolic parameters are consistent with  restrictive filling.  2. The right  ventricle has normal systolic function. The cavity was  normal. There is no increase in right ventricular wall thickness. Right  ventricular systolic pressure could not be assessed.  3. The mitral valve is normal in structure. There is moderate mitral  annular calcification present.  4. The aortic root is normal in size and structure.  5. The tricuspid valve is normal in structure.  6. Aortic valve is possibly tricuspid, but leaflet structure not well  defined. Aortic valve regurgitation is mild by color flow Doppler. There  is moderate to more likely severe aortic stenosis with LVOT/AV VTI 0.33  and calculated valve area 0.83 cm2.  Gradients suggest paradoxical low gradient stenosis in the setting of  normal LVEF.     Assessment and Plan   1. Aortic stenosis - moderate to severe by echo. - has been asymptomatic - repeat surveillance echo  2. HTN - essentailly at goal, continue current meds  3. Hyperlipidemia - request pcp labs, continue statin  EKG today shows chronic LBBB    Arnoldo Lenis, M.D.

## 2020-01-21 IMAGING — DX LUMBAR SPINE - COMPLETE 4+ VIEW
5 series · 5 of 5 positions shown · non-contrast
Comparison: None.

CLINICAL DATA: Left foot numbness

EXAM:
LUMBAR SPINE - COMPLETE 4+ VIEW

[l-spine ap]
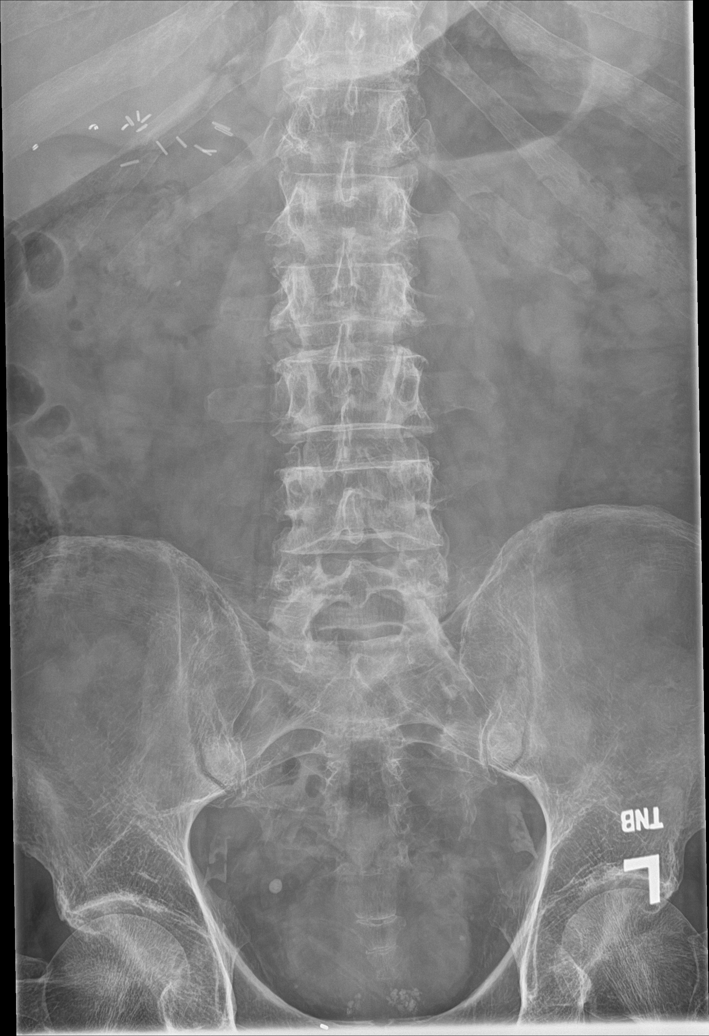

[l-spine obl (1 of 2)]
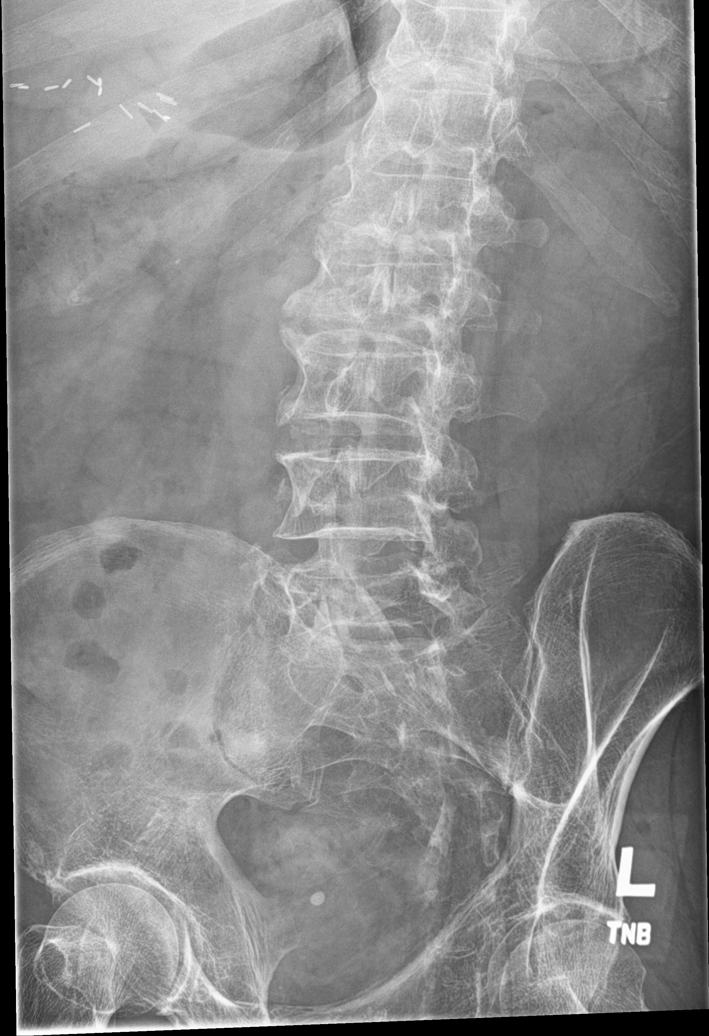

[l-spine obl (2 of 2)]
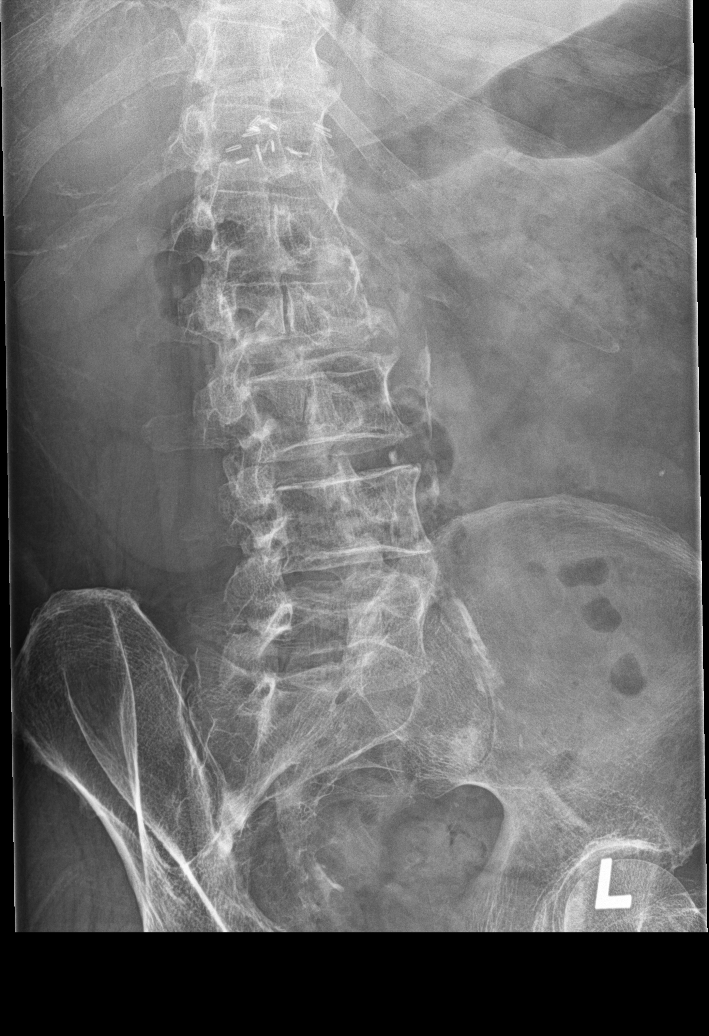

[l-spine lat]
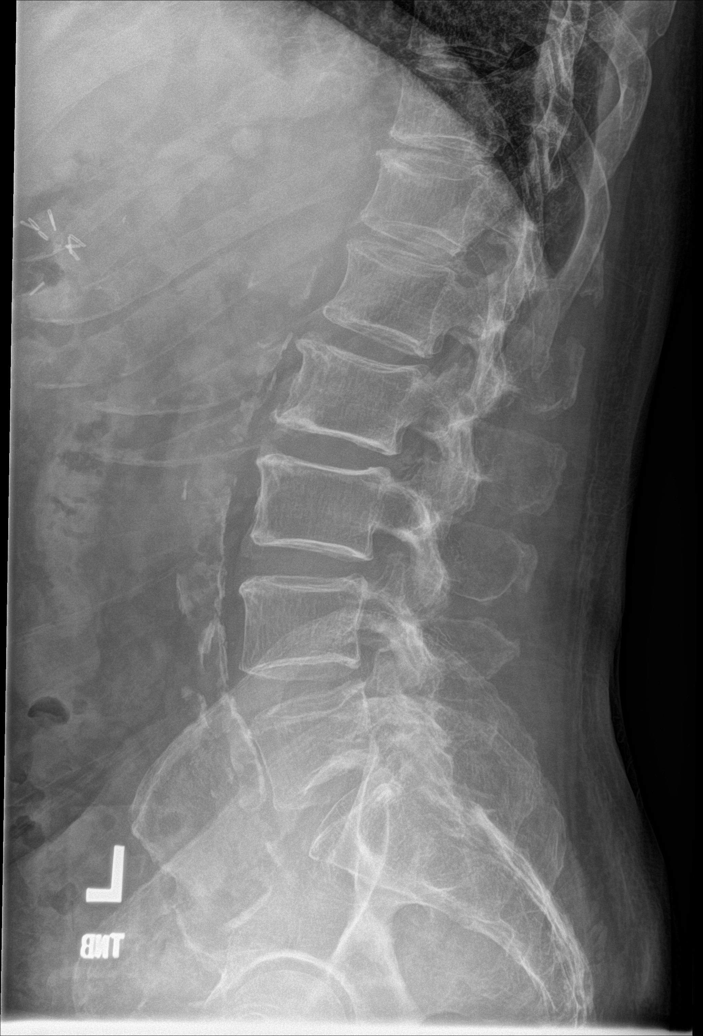

[l-spine spot]
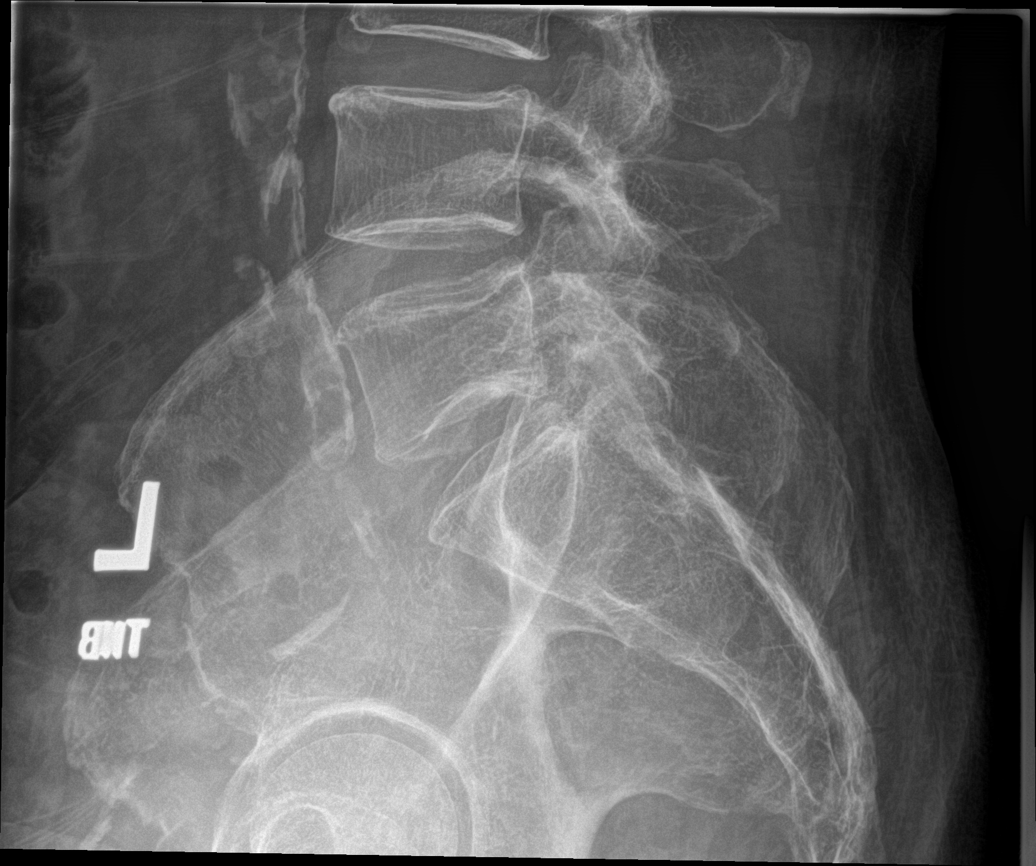

[5 of 5 positions shown; findings below may reference images not displayed]

FINDINGS: Five lumbar type vertebral bodies are well visualized. Vertebral
body height is well maintained. No pars defects are noted. Mild
osteophytic changes are seen. Aortic calcifications are noted
without aneurysmal dilatation. No soft tissue abnormality is seen.
IMPRESSION: Degenerative change without acute abnormality noted.

## 2020-01-23 ENCOUNTER — Other Ambulatory Visit (HOSPITAL_COMMUNITY): Payer: Medicare Other

## 2020-01-24 ENCOUNTER — Ambulatory Visit (HOSPITAL_COMMUNITY)
Admission: RE | Admit: 2020-01-24 | Discharge: 2020-01-24 | Disposition: A | Discharge status: Home / Self Care | Payer: Medicare Other | Source: Ambulatory Visit | Attending: Cardiology | Admitting: Cardiology

## 2020-01-24 ENCOUNTER — Other Ambulatory Visit: Payer: Self-pay

## 2020-01-24 DIAGNOSIS — I350 Nonrheumatic aortic (valve) stenosis: Secondary | ICD-10-CM | POA: Insufficient documentation

## 2020-01-24 LAB — ECHOCARDIOGRAM COMPLETE
AR max vel: 0.95 cm2
AV Area VTI: 0.87 cm2
AV Area mean vel: 0.99 cm2
AV Mean grad: 28.9 mmHg
AV Peak grad: 48.7 mmHg
Ao pk vel: 3.49 m/s
Area-P 1/2: 2.07 cm2
P 1/2 time: 558 msec
S' Lateral: 2.66 cm

## 2020-01-24 NOTE — Progress Notes (Signed)
*  PRELIMINARY RESULTS* Echocardiogram 2D Echocardiogram has been performed.  Calvin Moreno 01/24/2020, 3:00 PM

## 2020-01-26 ENCOUNTER — Telehealth: Payer: Self-pay | Admitting: *Deleted

## 2020-01-26 ENCOUNTER — Other Ambulatory Visit (HOSPITAL_COMMUNITY): Payer: Medicare Other

## 2020-01-26 NOTE — Telephone Encounter (Signed)
Pt voiced understanding

## 2020-01-26 NOTE — Telephone Encounter (Signed)
-----   Message from Arnoldo Lenis, MD sent at 01/26/2020 11:38 AM EDT ----- Aortic valve remains moderate to severely stiffened (aortic stenosis), overall stable. Just something to conitnue to monitor at this time  Zandra Abts MD

## 2020-03-06 ENCOUNTER — Encounter: Payer: Self-pay | Admitting: Internal Medicine

## 2020-03-10 ENCOUNTER — Ambulatory Visit: Payer: Medicare Other | Attending: Internal Medicine

## 2020-03-10 DIAGNOSIS — Z23 Encounter for immunization: Secondary | ICD-10-CM

## 2020-03-10 NOTE — Progress Notes (Signed)
   Covid-19 Vaccination Clinic  Name:  ANDRZEJ SCULLY    MRN: [id number] DOB: [birth year]  03/10/2020  Mr. Puig was observed post Covid-19 immunization for 15 minutes without incident. He was provided with Vaccine Information Sheet and instruction to access the V-Safe system.   Mr. Hughlett was instructed to call 911 with any severe reactions post vaccine: Marland Kitchen Difficulty breathing  . Swelling of face and throat  . A fast heartbeat  . A bad rash all over body  . Dizziness and weakness

## 2020-03-27 ENCOUNTER — Ambulatory Visit: Payer: Medicare Other | Admitting: Internal Medicine

## 2020-04-17 ENCOUNTER — Encounter: Payer: Self-pay | Admitting: Internal Medicine

## 2020-04-17 ENCOUNTER — Other Ambulatory Visit: Payer: Self-pay

## 2020-04-17 ENCOUNTER — Telehealth: Payer: Self-pay

## 2020-04-17 ENCOUNTER — Ambulatory Visit (INDEPENDENT_AMBULATORY_CARE_PROVIDER_SITE_OTHER): Payer: Medicare Other | Admitting: Internal Medicine

## 2020-04-17 VITALS — BP 142/70 | HR 107 | Temp 97.0°F | Ht 66.0 in | Wt 180.8 lb

## 2020-04-17 DIAGNOSIS — K627 Radiation proctitis: Secondary | ICD-10-CM

## 2020-04-17 DIAGNOSIS — K921 Melena: Secondary | ICD-10-CM | POA: Diagnosis not present

## 2020-04-17 NOTE — Patient Instructions (Signed)
Schedule a diagnostic colonoscopy (hematochezia and hemocult positive stool) Propofol ASA III  Further recommendations to follow

## 2020-04-17 NOTE — Telephone Encounter (Signed)
No PA needed for TCS per [facility] website:  BLOCKING - Product does not have notification requirements.

## 2020-04-17 NOTE — Progress Notes (Signed)
Primary Care Physician:  Lemmie Evens, MD Primary Gastroenterologist:  Dr. Gala Romney  Pre-Procedure History & Physical: HPI:  Calvin Moreno is a 79 y.o. male here for further evaluation Hemoccult positive stool.  Long history of radiation-induced proctitis.  Treated with APC 2013, 2015.  Colonoscopy in 2013 and flex sig 2015. History of diarrhea which has settled down;  he typically has 1 bowel movement daily;  he may have an episode of minor incontinence about every 3 to 4 months.  Does well otherwise.  Previously on Metamucil and Questran but none of that now.  A few years back, he read about pancreatic insufficiency and insisted trying a course of Creon.  He was given some samples which did not help.  Currently not taking.  Mild GERD takes Protonix 40 mg daily.  No dysphagia.  Past Medical History:  Diagnosis Date  . Aortic stenosis, mild 2000   And insufficiency, evaluation by Dr. Verl Blalock in 2000  . Arthritis   . Benign prostatic hypertrophy    PSA of 1.39 in 09/2010  . Chronic kidney disease    Borderline; creatinine of 1.6 in 08/2009, but 1.15 in 05/2010  . Chronic sinusitis    By MRI  . Diverticulosis 2013  . Hearing impaired   . Hemorrhoids 2006  . Hiatal hernia 2006  . Hyperlipidemia    Elevated triglycerides  . Hypertension    Mild internal carotid artery plaque on MRI/MRA in 2002  . IBS (irritable bowel syndrome)   . Insomnia   . Left bundle branch block 2012   2012  . Metabolic syndrome    Fasting hyperglycemia  . Murmur   . Nephrolithiasis 08/2009   2011  . Obesity   . Proctitis    radiation induced  . Prostate cancer ([place]) 11/2010   s/p XRT  . Seasonal allergies   . Stroke [facility])    TIA, no residual, seen on MRI  . 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 Uses hearing aid    bilateral    Past Surgical History:  Procedure Laterality Date  . ANKLE FRACTURE SURGERY     left  . CATARACT EXTRACTION W/PHACO Right 12/19/2013    Procedure: CATARACT EXTRACTION PHACO AND INTRAOCULAR LENS PLACEMENT RIGHT EYE CDE=14.78;  Surgeon: Tonny Branch, MD;  Location: [facility];  Service: Ophthalmology;  Laterality: Right;  . CHOLECYSTECTOMY    . COLONOSCOPY  2006   Dr. Ward Givens mucosa throughout colon, moderate internal hemorrhoids. bx= benign colonic mucosa  . COLONOSCOPY  12/03/2011   Dr. Gala Romney- colonic diverticulosis, radiation-induced proctitis- s/p APC ablation, no microscopic colitis on bx  . ESOPHAGOGASTRODUODENOSCOPY  2006   Dr. Oletta Lamas- small hiatal hernia and slightly reddened distal esophagus o/w normal  . FLEXIBLE SIGMOIDOSCOPY N/A 07/13/2013   [id number] changes of the rectum with active oozing consistent with chronic radiation proctitis.  Otherwise, negative sigmoidoscopy to 40 cm.Status post argon plasma coagulation ablation   . HOT HEMOSTASIS N/A 07/13/2013   Procedure: HOT HEMOSTASIS (ARGON PLASMA COAGULATION/BICAP);  Surgeon: Daneil Dolin, MD;  Location: [facility];  Service: Endoscopy;  Laterality: N/A;  . Ileocolonoscopy  12/03/2011   [id number] diverticulosis. Radiation-induced proctitis status post APC ablation. Status post segmental colon Biopsy  . TONSILLECTOMY      Prior to Admission medications   Medication Sig Start Date End Date Taking? Authorizing Provider  acyclovir (ZOVIRAX) 800 MG tablet Take 800 mg by mouth 3 (three) times daily.  Yes [provider]  ALPRAZolam Duanne Moron) 0.5 MG tablet Take 0.5 mg by mouth at bedtime as needed. For sleep   Yes [provider]  aspirin EC 81 MG tablet Take 81 mg by mouth daily.   Yes [provider]  cholecalciferol (VITAMIN D) 1000 UNITS tablet Take 1,000 Units by mouth daily.     Yes [provider]  clobetasol (TEMOVATE) 0.05 % external solution APPLY TO THE SCALP TWICETDAILY AS NEEDED. ( DO NOT APPLY TO THE FACE, GROIN, UNDERARMS.) 09/05/19  Yes [provider]  Cyanocobalamin (VITAMIN B 12 PO) Take by mouth.    Yes [provider]  fexofenadine (ALLEGRA) 180 MG tablet Take 180 mg by mouth daily as needed. For allergies   Yes [provider]  finasteride (PROSCAR) 5 MG tablet Take 1 tablet (5 mg total) by mouth daily. 10/19/19  Yes McKenzie, Candee Furbish, MD  gabapentin (NEURONTIN) 100 MG capsule 200mg  in am and at noon, 300mg  at bedtime 01/10/19  Yes [provider]  hydrochlorothiazide (HYDRODIURIL) 12.5 MG tablet Take 12.5 mg by mouth daily. 01/03/20  Yes [provider]  hydrochlorothiazide (HYDRODIURIL) 25 MG tablet Take 0.5 tablets (12.5 mg total) by mouth daily. 09/19/14  Yes BranchAlphonse Guild, MD  meclizine (ANTIVERT) 25 MG tablet Take 25 mg by mouth at bedtime.    Yes [provider]  Melatonin 3 MG TABS Take 1 tablet by mouth at bedtime. For sleep   Yes [provider]  Omega-3 Fatty Acids (OMEGA-3 FISH OIL) 1200 MG CAPS Take 1 capsule by mouth daily.     Yes [provider]  pantoprazole (PROTONIX) 40 MG tablet Take 40 mg by mouth as needed.  11/04/18  Yes [provider]  silodosin (RAPAFLO) 8 MG CAPS capsule Take 1 capsule (8 mg total) by mouth daily with breakfast. 10/19/19  Yes McKenzie, Candee Furbish, MD  simvastatin (ZOCOR) 20 MG tablet Take 20 mg by mouth every evening.   Yes [provider]  terazosin (HYTRIN) 5 MG capsule Take 10 mg by mouth at bedtime.    Yes [provider]  vitamin C (ASCORBIC ACID) 500 MG tablet Take 1,000 mg by mouth daily.    Yes [provider]    Allergies as of 04/17/2020 - Review Complete 04/17/2020  Allergen Reaction Noted  . Norvasc [amlodipine besylate] Swelling 09/19/2014  . Codeine Nausea And Vomiting 11/06/2011    Family History  Problem Relation Age of Onset  . Heart failure Mother   . Heart attack Father   . Heart failure Brother        bladder cancer (agent orange exposure)  . Cirrhosis Brother        chronic viral hepatitis  . Colon cancer Neg Hx      Social History   Socioeconomic History  . Marital status: Married    Spouse name: Not on file  . Number of children: 2  . Years of education: Not on file  . Highest education level: Not on file  Occupational History  . Occupation: Retired    Fish farm manager: [employer], [employer]  Tobacco Use  . Smoking status: Former Smoker    Packs/day: 1.50    Years: 40.00    Pack years: 60.00    Quit date: 11/04/1997    Years since quitting: 22.4  . Smokeless tobacco: Never Used  . Tobacco comment: Quit in 1999  Vaping Use  . Vaping Use: Never used  Substance and Sexual Activity  .  Alcohol use: No    Alcohol/week: 0.0 standard drinks  . Drug use: No  . Sexual activity: Not on file  Other Topics Concern  . Not on file  Social History Narrative   Married with 2 children   [place]ives locally   No regular exercise   Social Determinants of Health   Financial Resource Strain:   . Difficulty of Paying Living Expenses: Not on file  Food Insecurity:   . Worried About Charity fundraiser in the Last Year: Not on file  . Ran Out of Food in the Last Year: Not on file  Transportation Needs:   . Lack of Transportation (Medical): Not on file  . Lack of Transportation (Non-Medical): Not on file  Physical Activity:   . Days of Exercise per Week: Not on file  . Minutes of Exercise per Session: Not on file  Stress:   . Feeling of Stress : Not on file  Social Connections:   . Frequency of Communication with Friends and Family: Not on file  . Frequency of Social Gatherings with Friends and Family: Not on file  . Attends Religious Services: Not on file  . Active Member of Clubs or Organizations: Not on file  . Attends Archivist Meetings: Not on file  . Marital Status: Not on file  Intimate Partner Violence:   . Fear of Current or Ex-Partner: Not on file  . Emotionally Abused: Not on file  . Physically Abused: Not on file  . Sexually Abused: Not on file    Review of Systems: See HPI,  otherwise negative ROS  Physical Exam: BP (!) 142/70   Pulse (!) 107   Temp (!) 97 F (36.1 C)   Ht 5\' 6"  (1.676 m)   Wt 180 lb 12.8 oz (82 kg)   BMI 29.18 kg/m  General:   Alert,   pleasant and cooperative in NAD Neck:  Supple; no masses or thyromegaly. No significant cervical adenopathy. Lungs:  Clear throughout to auscultation.   No wheezes, crackles, or rhonchi. No acute distress. Heart:  Regular rate and rhythm; no murmurs, clicks, rubs,  or gallops. Abdomen: Non-distended, normal bowel sounds.  Soft and nontender without appreciable mass or hepatosplenomegaly.   Impression/Plan: 79 year old gentleman with slight rectal bleeding Hemoccult positive stool.  History of radiation proctitis.  Occult positive stool could easily be due to radiation effects involving the rectum.  However, its been almost 10 years since he had his entire colon looked at.  To evaluate further, I have offered him a diagnostic colonoscopy;  depending on findings he may benefit from [facility] of abnormal rectal mucosa as appropriate at the same time.  Has mild aortic stenosis and should do well with sedation. He also feels he has problems with hemorrhoids from time to time  Recommendations: I have offered this patient a diagnostic colonoscopy in the near future to further evaluate rectal bleeding,  Hemoccult positive stool.  We will utilize propofol.  ASA 3 The risks, benefits, limitations, alternatives and imponderables have been reviewed with the patient. Questions have been answered. All parties are agreeable.    Notice: This dictation was prepared with Dragon dictation along with smaller phrase technology. Any transcriptional errors that result from this process are unintentional and may not be corrected upon review.

## 2020-04-18 ENCOUNTER — Other Ambulatory Visit: Payer: Self-pay

## 2020-04-18 ENCOUNTER — Ambulatory Visit: Payer: Medicare Other | Admitting: Urology

## 2020-04-18 DIAGNOSIS — N200 Calculus of kidney: Secondary | ICD-10-CM

## 2020-06-06 NOTE — Patient Instructions (Signed)
Calvin Moreno  06/06/2020     @PREFPERIOPPHARMACY @   Your procedure is scheduled on  06/12/2020  Report to [facility] at  1:15  P.M.  Call this number if you have problems the morning of surgery:  [phone]   Remember:  Follow the diet and prep instrucions given to you by the office.                      Take these medicines the morning of surgery with A SIP OF WATER  Allegra, proscar, gabapentin, protonix, hytrin.    Do not wear jewelry, make-up or nail polish.  Do not wear lotions, powders, or perfumes, or deodorant. Please brush your teeth.  Do not shave 48 hours prior to surgery.  Men may shave face and neck.  Do not bring valuables to the hospital.  [facility] is not responsible for any belongings or valuables.  Contacts, dentures or bridgework may not be worn into surgery.  Leave your suitcase in the car.  After surgery it may be brought to your room.  For patients admitted to the hospital, discharge time will be determined by your treatment team.  Patients discharged the day of surgery will not be allowed to drive home.   Name and phone number of your driver:   family Special instructions:  DO NOT smoke the morning of your procedure.  Please read over the following fact sheets that you were given. Anesthesia Post-op Instructions and Care and Recovery After Surgery       Colonoscopy, Adult, Care After This sheet gives you information about how to care for yourself after your procedure. Your health care provider may also give you more specific instructions. If you have problems or questions, contact your health care provider. What can I expect after the procedure? After the procedure, it is common to have:  A small amount of blood in your stool for 24 hours after the procedure.  Some gas.  Mild cramping or bloating of your abdomen. Follow these instructions at home: Eating and drinking  Drink enough fluid to keep your urine pale  yellow.  Follow instructions from your health care provider about eating or drinking restrictions.  Resume your normal diet as instructed by your health care provider. Avoid heavy or fried foods that are hard to digest.   Activity  Rest as told by your health care provider.  Avoid sitting for a long time without moving. Get up to take short walks every 1-2 hours. This is important to improve blood flow and breathing. Ask for help if you feel weak or unsteady.  Return to your normal activities as told by your health care provider. Ask your health care provider what activities are safe for you. Managing cramping and bloating  Try walking around when you have cramps or feel bloated.  Apply heat to your abdomen as told by your health care provider. Use the heat source that your health care provider recommends, such as a moist heat pack or a heating pad. ? Place a towel between your skin and the heat source. ? Leave the heat on for 20-30 minutes. ? Remove the heat if your skin turns bright red. This is especially important if you are unable to feel pain, heat, or cold. You may have a greater risk of getting burned.   General instructions  If you were given a sedative during the procedure, it can affect you for several hours.  Do not drive or operate machinery until your health care provider says that it is safe.  For the first 24 hours after the procedure: ? Do not sign important documents. ? Do not drink alcohol. ? Do your regular daily activities at a slower pace than normal. ? Eat soft foods that are easy to digest.  Take over-the-counter and prescription medicines only as told by your health care provider.  Keep all follow-up visits as told by your health care provider. This is important. Contact a health care provider if:  You have blood in your stool 2-3 days after the procedure. Get help right away if you have:  More than a small spotting of blood in your stool.  Large blood  clots in your stool.  Swelling of your abdomen.  Nausea or vomiting.  A fever.  Increasing pain in your abdomen that is not relieved with medicine. Summary  After the procedure, it is common to have a small amount of blood in your stool. You may also have mild cramping and bloating of your abdomen.  If you were given a sedative during the procedure, it can affect you for several hours. Do not drive or operate machinery until your health care provider says that it is safe.  Get help right away if you have a lot of blood in your stool, nausea or vomiting, a fever, or increased pain in your abdomen. This information is not intended to replace advice given to you by your health care provider. Make sure you discuss any questions you have with your health care provider. Document Revised: 05/06/2019 Document Reviewed: 12/06/2018 Elsevier Patient Education  2021 [place] After This sheet gives you information about how to care for yourself after your procedure. Your health care provider may also give you more specific instructions. If you have problems or questions, contact your health care provider. What can I expect after the procedure? After the procedure, it is common to have:  Tiredness.  Forgetfulness about what happened after the procedure.  Impaired judgment for important decisions.  Nausea or vomiting.  Some difficulty with balance. Follow these instructions at home: For the time period you were told by your health care provider:  Rest as needed.  Do not participate in activities where you could fall or become injured.  Do not drive or use machinery.  Do not drink alcohol.  Do not take sleeping pills or medicines that cause drowsiness.  Do not make important decisions or sign legal documents.  Do not take care of children on your own.      Eating and drinking  Follow the diet that is recommended by your health care  provider.  Drink enough fluid to keep your urine pale yellow.  If you vomit: ? Drink water, juice, or soup when you can drink without vomiting. ? Make sure you have little or no nausea before eating solid foods. General instructions  Have a responsible adult stay with you for the time you are told. It is important to have someone help care for you until you are awake and alert.  Take over-the-counter and prescription medicines only as told by your health care provider.  If you have sleep apnea, surgery and certain medicines can increase your risk for breathing problems. Follow instructions from your health care provider about wearing your sleep device: ? Anytime you are sleeping, including during daytime naps. ? While taking prescription pain medicines, sleeping medicines, or medicines that make you drowsy.  Avoid smoking.  Keep all follow-up visits as told by your health care provider. This is important. Contact a health care provider if:  You keep feeling nauseous or you keep vomiting.  You feel light-headed.  You are still sleepy or having trouble with balance after 24 hours.  You develop a rash.  You have a fever.  You have redness or swelling around the IV site. Get help right away if:  You have trouble breathing.  You have new-onset confusion at home. Summary  For several hours after your procedure, you may feel tired. You may also be forgetful and have poor judgment.  Have a responsible adult stay with you for the time you are told. It is important to have someone help care for you until you are awake and alert.  Rest as told. Do not drive or operate machinery. Do not drink alcohol or take sleeping pills.  Get help right away if you have trouble breathing, or if you suddenly become confused. This information is not intended to replace advice given to you by your health care provider. Make sure you discuss any questions you have with your health care  provider. Document Revised: 01/26/2020 Document Reviewed: 04/14/2019 Elsevier Patient Education  2021 [employer].

## 2020-06-08 ENCOUNTER — Other Ambulatory Visit: Payer: Self-pay

## 2020-06-08 ENCOUNTER — Other Ambulatory Visit (HOSPITAL_COMMUNITY)
Admission: RE | Admit: 2020-06-08 | Discharge: 2020-06-08 | Disposition: A | Discharge status: Home / Self Care | Payer: Medicare Other | Source: Ambulatory Visit | Attending: Internal Medicine | Admitting: Internal Medicine

## 2020-06-08 ENCOUNTER — Encounter (HOSPITAL_COMMUNITY)
Admission: RE | Admit: 2020-06-08 | Discharge: 2020-06-08 | Disposition: A | Discharge status: Home / Self Care | Payer: Medicare Other | Source: Ambulatory Visit | Attending: Internal Medicine | Admitting: Internal Medicine

## 2020-06-08 ENCOUNTER — Encounter (HOSPITAL_COMMUNITY): Payer: Self-pay

## 2020-06-08 DIAGNOSIS — Z01812 Encounter for preprocedural laboratory examination: Secondary | ICD-10-CM | POA: Insufficient documentation

## 2020-06-08 DIAGNOSIS — Z20822 Contact with and (suspected) exposure to covid-19: Secondary | ICD-10-CM | POA: Insufficient documentation

## 2020-06-08 HISTORY — DX: Unspecified hearing loss, unspecified ear: H91.90

## 2020-06-08 LAB — BASIC METABOLIC PANEL
Anion gap: 8 (ref 5–15)
BUN: 17 mg/dL (ref 8–23)
CO2: 28 mmol/L (ref 22–32)
Calcium: 8.7 mg/dL — ABNORMAL LOW (ref 8.9–10.3)
Chloride: 102 mmol/L (ref 98–111)
Creatinine, Ser: 1.16 mg/dL (ref 0.61–1.24)
GFR, Estimated: >60 mL/min (ref >60)
Glucose, Bld: 180 mg/dL — ABNORMAL HIGH (ref 70–99)
Potassium: 3.4 mmol/L — ABNORMAL LOW (ref 3.5–5.1)
Sodium: 138 mmol/L (ref 135–145)

## 2020-06-08 LAB — SARS CORONAVIRUS 2 (TAT 6-24 HRS): SARS Coronavirus 2: NEGATIVE

## 2020-06-12 ENCOUNTER — Ambulatory Visit (HOSPITAL_COMMUNITY): Payer: Medicare Other | Admitting: Anesthesiology

## 2020-06-12 ENCOUNTER — Encounter (HOSPITAL_COMMUNITY): Payer: Self-pay | Admitting: Internal Medicine

## 2020-06-12 ENCOUNTER — Ambulatory Visit (HOSPITAL_COMMUNITY)
Admission: RE | Admit: 2020-06-12 | Discharge: 2020-06-12 | Disposition: A | Discharge status: Home / Self Care | Payer: Medicare Other | Attending: Internal Medicine | Admitting: Internal Medicine

## 2020-06-12 ENCOUNTER — Other Ambulatory Visit: Payer: Self-pay

## 2020-06-12 ENCOUNTER — Encounter (HOSPITAL_COMMUNITY)
Admission: RE | Disposition: A | Discharge status: Home / Self Care | Payer: Self-pay | Source: Home / Self Care | Attending: Internal Medicine

## 2020-06-12 DIAGNOSIS — Z8673 Personal history of transient ischemic attack (TIA), and cerebral infarction without residual deficits: Secondary | ICD-10-CM | POA: Insufficient documentation

## 2020-06-12 DIAGNOSIS — Z8379 Family history of other diseases of the digestive system: Secondary | ICD-10-CM | POA: Insufficient documentation

## 2020-06-12 DIAGNOSIS — Z8249 Family history of ischemic heart disease and other diseases of the circulatory system: Secondary | ICD-10-CM | POA: Diagnosis not present

## 2020-06-12 DIAGNOSIS — Z87891 Personal history of nicotine dependence: Secondary | ICD-10-CM | POA: Insufficient documentation

## 2020-06-12 DIAGNOSIS — I129 Hypertensive chronic kidney disease with stage 1 through stage 4 chronic kidney disease, or unspecified chronic kidney disease: Secondary | ICD-10-CM | POA: Insufficient documentation

## 2020-06-12 DIAGNOSIS — Z923 Personal history of irradiation: Secondary | ICD-10-CM | POA: Diagnosis not present

## 2020-06-12 DIAGNOSIS — Z8546 Personal history of malignant neoplasm of prostate: Secondary | ICD-10-CM | POA: Diagnosis not present

## 2020-06-12 DIAGNOSIS — K5790 Diverticulosis of intestine, part unspecified, without perforation or abscess without bleeding: Secondary | ICD-10-CM

## 2020-06-12 DIAGNOSIS — Z888 Allergy status to other drugs, medicaments and biological substances status: Secondary | ICD-10-CM | POA: Insufficient documentation

## 2020-06-12 DIAGNOSIS — K5730 Diverticulosis of large intestine without perforation or abscess without bleeding: Secondary | ICD-10-CM | POA: Diagnosis not present

## 2020-06-12 DIAGNOSIS — Z885 Allergy status to narcotic agent status: Secondary | ICD-10-CM | POA: Insufficient documentation

## 2020-06-12 DIAGNOSIS — K589 Irritable bowel syndrome without diarrhea: Secondary | ICD-10-CM | POA: Diagnosis not present

## 2020-06-12 DIAGNOSIS — K921 Melena: Secondary | ICD-10-CM | POA: Diagnosis present

## 2020-06-12 DIAGNOSIS — K627 Radiation proctitis: Secondary | ICD-10-CM | POA: Insufficient documentation

## 2020-06-12 DIAGNOSIS — N189 Chronic kidney disease, unspecified: Secondary | ICD-10-CM | POA: Diagnosis not present

## 2020-06-12 HISTORY — PX: COLONOSCOPY WITH PROPOFOL: SHX5780

## 2020-06-12 SURGERY — COLONOSCOPY WITH PROPOFOL
Anesthesia: General

## 2020-06-12 MED ORDER — LACTATED RINGERS IV SOLN: INTRAVENOUS | Status: DC

## 2020-06-12 MED ORDER — STERILE WATER FOR IRRIGATION IR SOLN
Status: DC | PRN
  Administered 2020-06-12: 100 mL

## 2020-06-12 MED ORDER — PROPOFOL 10 MG/ML IV BOLUS
INTRAVENOUS | Status: DC | PRN
  Administered 2020-06-12: 60 mg via INTRAVENOUS
  Administered 2020-06-12: 100 ug/kg/min via INTRAVENOUS

## 2020-06-12 NOTE — Op Note (Signed)
[facility] Patient Name: Calvin Moreno Procedure Date: 06/12/2020 9:45 AM MRN: [id number] Date of Birth: 04/11/1941 Attending MD: Norvel Richards , MD CSN: [id number] Age: 80 Admit Type: Outpatient Procedure:                Colonoscopy Indications:              Hematochezia, Heme positive stool Providers:                Norvel Richards, MD, Lambert Mody, Aram Candela Referring MD:              Medicines:                Propofol per Anesthesia Complications:            No immediate complications. Estimated Blood Loss:     Estimated blood loss: none. Procedure:                Pre-Anesthesia Assessment:                           - Prior to the procedure, a History and Physical                            was performed, and patient medications and                            allergies were reviewed. The patient's tolerance of                            previous anesthesia was also reviewed. The risks                            and benefits of the procedure and the sedation                            options and risks were discussed with the patient.                            All questions were answered, and informed consent                            was obtained. Prior Anticoagulants: The patient has                            taken no previous anticoagulant or antiplatelet                            agents. ASA Grade Assessment: III - A patient with                            severe systemic disease. After reviewing the risks  and benefits, the patient was deemed in                            satisfactory condition to undergo the procedure.                           After obtaining informed consent, the colonoscope                            was passed under direct vision. Throughout the                            procedure, the patient's blood pressure, pulse, and                            oxygen saturations  were monitored continuously. The                            CF-[id number] ([id number]) scope was introduced through                            the anus and advanced to the the cecum, identified                            by appendiceal orifice and ileocecal valve. The                            colonoscopy was performed without difficulty. The                            patient tolerated the procedure well. The quality                            of the bowel preparation was adequate. Scope In: 11:21:04 AM Scope Out: 11:39:53 AM Scope Withdrawal Time: 0 hours 12 minutes 33 seconds  Total Procedure Duration: 0 hours 18 minutes 49 seconds  Findings:      The perianal and digital rectal examinations were normal.      Scattered medium-mouthed diverticula were found in the sigmoid colon.       Again, localized area of neovascular changes found on the distal       anterior rectal wall. Rectal vault too small to retroflex. Seen well on       face. Remainder the rectal and colonic mucosa appeared normal. I       obtained a circular APC probe and judiciously ablated the neovascular       area with about 6 applications of APC at 20 J each. This was done       effectively without apparent complication. Good hemostasis maintained. Impression:               - Diverticulosis in the sigmoid colon.                           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 Moderate Sedation:      Moderate (conscious) sedation was personally administered by an       anesthesia professional. The following parameters were monitored: oxygen       saturation, heart rate, blood pressure, respiratory rate, EKG, adequacy       of pulmonary ventilation, and response to care. Recommendation:           - Patient has a contact number available for                            emergencies. The signs and symptoms of potential                            delayed complications  were discussed with the                            patient. Return to normal activities tomorrow.                            Written discharge instructions were provided to the                            patient.                           - Advance diet as tolerated.                           - Continue present medications.                           - No repeat colonoscopy due to age.                           - Return to GI clinic in 3 months. Procedure Code(s):        --- Professional ---                           [phone], Colonoscopy, flexible; diagnostic, including                            collection of specimen(s) by brushing or washing,                            when performed (separate procedure) Diagnosis Code(s):        --- Professional ---                           K92.1, Melena (includes Hematochezia)                           R19.5, Other fecal abnormalities                           K57.30, Diverticulosis of large intestine without  perforation or abscess without bleeding CPT copyright 2019 American Medical Association. All rights reserved. The codes documented in this report are preliminary and upon coder review may  be revised to meet current compliance requirements. Cristopher Estimable. Lilymarie Scroggins, MD Norvel Richards, MD 06/12/2020 11:56:28 AM This report has been signed electronically. Number of Addenda: 0

## 2020-06-12 NOTE — H&P (Signed)
@LOGO @   Primary Care Physician:  Lemmie Evens, MD Primary Gastroenterologist:  Dr. Gala Romney  Pre-Procedure History & Physical: HPI:  Calvin Moreno is a 80 y.o. male here for further evaluation of rectal bleeding Hemoccult positive stool.  History of radiation proctitis treated with APC previously.  Last lower GI tract examination was almost 10 years ago.  He is here for diagnostic colonoscopy.  Past Medical History:  Diagnosis Date  . Aortic stenosis, mild 2000   And insufficiency, evaluation by Dr. Verl Blalock in 2000  . Arthritis   . Benign prostatic hypertrophy    PSA of 1.39 in 09/2010  . Chronic kidney disease    Borderline; creatinine of 1.6 in 08/2009, but 1.15 in 05/2010  . Chronic sinusitis    By MRI  . Diverticulosis 2013  . Hearing impaired   . Hemorrhoids 2006  . Hiatal hernia 2006  . HOH (hard of hearing)   . Hyperlipidemia    Elevated triglycerides  . Hypertension    Mild internal carotid artery plaque on MRI/MRA in 2002  . IBS (irritable bowel syndrome)   . Insomnia   . Left bundle branch block 2012   2012  . Metabolic syndrome    Fasting hyperglycemia  . Murmur   . Nephrolithiasis 08/2009   2011  . Obesity   . Proctitis    radiation induced  . Prostate cancer ([place]) 11/2010   s/p XRT  . Seasonal allergies   . Stroke [facility])    TIA, no residual, seen on MRI  . 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 Uses hearing aid    bilateral    Past Surgical History:  Procedure Laterality Date  . ANKLE FRACTURE SURGERY     left  . CATARACT EXTRACTION Left   . CATARACT EXTRACTION W/PHACO Right 12/19/2013   Procedure: CATARACT EXTRACTION PHACO AND INTRAOCULAR LENS PLACEMENT RIGHT EYE CDE=14.78;  Surgeon: Tonny Branch, MD;  Location: [facility] ORS;  Service: Ophthalmology;  Laterality: Right;  . CHOLECYSTECTOMY    . COLONOSCOPY  2006   Dr. Ward Givens mucosa throughout colon, moderate internal hemorrhoids. bx= benign colonic mucosa  . COLONOSCOPY   12/03/2011   Dr. Gala Romney- colonic diverticulosis, radiation-induced proctitis- s/p APC ablation, no microscopic colitis on bx  . ESOPHAGOGASTRODUODENOSCOPY  2006   Dr. Oletta Lamas- small hiatal hernia and slightly reddened distal esophagus o/w normal  . FLEXIBLE SIGMOIDOSCOPY N/A 07/13/2013   WLN:[id number] changes of the rectum with active oozing consistent with chronic radiation proctitis.  Otherwise, negative sigmoidoscopy to 40 cm.Status post argon plasma coagulation ablation   . HOT HEMOSTASIS N/A 07/13/2013   Procedure: HOT HEMOSTASIS (ARGON PLASMA COAGULATION/BICAP);  Surgeon: Daneil Dolin, MD;  Location: [facility] ENDO SUITE;  Service: Endoscopy;  Laterality: N/A;  . Ileocolonoscopy  12/03/2011   YCX:KGYJEHU diverticulosis. Radiation-induced proctitis status post APC ablation. Status post segmental colon Biopsy  . TONSILLECTOMY      Prior to Admission medications   Medication Sig Start Date End Date Taking? Authorizing Provider  acyclovir (ZOVIRAX) 800 MG tablet Take 800 mg by mouth See admin instructions. Take 800 mg daily, increase to 800 mg 3 times daily as needed when having a fever blister flare   Yes [provider]  ALPRAZolam (XANAX) 0.5 MG tablet Take 0.5 mg by mouth at bedtime as needed for sleep.   Yes [provider]  Ascorbic Acid (VITAMIN C) 1000 MG tablet Take 1,000 mg by mouth daily.   Yes  [provider]  aspirin EC 81 MG tablet Take 81 mg by mouth at bedtime.   Yes [provider]  clobetasol (TEMOVATE) 0.05 % external solution Apply 1 application topically daily as needed (eczema). 09/05/19  Yes [provider]  finasteride (PROSCAR) 5 MG tablet Take 1 tablet (5 mg total) by mouth daily. 10/19/19  Yes McKenzie, Candee Furbish, MD  furosemide (LASIX) 20 MG tablet Take 20 mg by mouth daily.   Yes [provider]  gabapentin (NEURONTIN) 100 MG capsule Take 200-300 mg by mouth See admin instructions. Take 200 mg in the morning and 300 mg  at night 01/10/19  Yes [provider]  hydrochlorothiazide (HYDRODIURIL) 12.5 MG tablet Take 12.5 mg by mouth daily. 01/03/20  Yes [provider]  meclizine (ANTIVERT) 25 MG tablet Take 25 mg by mouth at bedtime.   Yes [provider]  Melatonin 3 MG TABS Take 3 mg by mouth at bedtime. For sleep   Yes [provider]  naproxen sodium (ALEVE) 220 MG tablet Take 220 mg by mouth daily as needed (pain).   Yes [provider]  pantoprazole (PROTONIX) 40 MG tablet Take 40 mg by mouth daily. 11/04/18  Yes [provider]  Polyethyl Glycol-Propyl Glycol (SYSTANE OP) Place 1 drop into both eyes 2 (two) times daily.   Yes [provider]  PREVIDENT 5000 BOOSTER PLUS 1.1 % PSTE Place 1 application onto teeth in the morning and at bedtime. 05/03/20  Yes [provider]  simvastatin (ZOCOR) 20 MG tablet Take 20 mg by mouth every evening.   Yes [provider]  terazosin (HYTRIN) 5 MG capsule Take 5-10 mg by mouth See admin instructions. Take 5 mg in the morning and 10 mg at night   Yes [provider]  vitamin B-12 (CYANOCOBALAMIN) 1000 MCG tablet Take 2,000 mcg by mouth daily.   Yes [provider]  fexofenadine (ALLEGRA) 180 MG tablet Take 180 mg by mouth daily as needed for allergies.    [provider]  silodosin (RAPAFLO) 8 MG CAPS capsule Take 1 capsule (8 mg total) by mouth daily with breakfast. Patient not taking: No sig reported 10/19/19   Cleon Gustin, MD    Allergies as of 04/17/2020 - Review Complete 04/17/2020  Allergen Reaction Noted  . Norvasc [amlodipine besylate] Swelling 09/19/2014  . Codeine Nausea And Vomiting 11/06/2011    Family History  Problem Relation Age of Onset  . Heart failure Mother   . Heart attack Father   . Heart failure Brother        bladder cancer (agent orange exposure)  . Cirrhosis Brother        chronic viral hepatitis  . Colon cancer Neg Hx      Social History   Socioeconomic History  . Marital status: Married    Spouse name: Not on file  . Number of children: 2  . Years of education: Not on file  . Highest education level: Not on file  Occupational History  . Occupation: Retired    Fish farm manager: Engineer, [employer]  Tobacco Use  . Smoking status: Former Smoker    Packs/day: 1.50    Years: 40.00    Pack years: 60.00    Quit date: 11/04/1997    Years since quitting: 22.6  . Smokeless tobacco: Never Used  . Tobacco comment: Quit in 1999  Vaping Use  . Vaping Use: Never used  Substance and Sexual Activity  . Alcohol use: No  Alcohol/week: 0.0 standard drinks  . Drug use: No  . Sexual activity: Yes  Other Topics Concern  . Not on file  Social History Narrative   Married with 2 children   Lives locally   No regular exercise   Social Determinants of Health   Financial Resource Strain: Not on file  Food Insecurity: Not on file  Transportation Needs: Not on file  Physical Activity: Not on file  Stress: Not on file  Social Connections: Not on file  Intimate Partner Violence: Not on file    Review of Systems: See HPI, otherwise negative ROS  Physical Exam: Ht 5\' 6"  (1.676 m)   Wt 81.6 kg   BMI 29.05 kg/m  General:   Alert,  Well-developed, well-nourished, pleasant and cooperative in NAD Mouth:  No deformity or lesions. Neck:  Supple; no masses or thyromegaly. No significant cervical adenopathy. Lungs:  Clear throughout to auscultation.   No wheezes, crackles, or rhonchi. No acute distress. Heart:  Regular rate and rhythm; no murmurs, clicks, rubs,  or gallops. Abdomen: Non-distended, normal bowel sounds.  Soft and nontender without appreciable mass or hepatosplenomegaly.  Pulses:  Normal pulses noted. Extremities:  Without clubbing or edema.  Impression/Plan: 80 year old gentleman intermittent rectal bleeding with a history of radiation proctitis treated previously.  Its been nearly 10 years since he had  his lower GI tract evaluate.  He is Hemoccult positive.  I will offer the patient a diagnostic colonoscopy today per plan. The risks, benefits, limitations, alternatives and imponderables have been reviewed with the patient. Questions have been answered. All parties are agreeable.      Notice: This dictation was prepared with Dragon dictation along with smaller phrase technology. Any transcriptional errors that result from this process are unintentional and may not be corrected upon review.

## 2020-06-12 NOTE — Anesthesia Postprocedure Evaluation (Signed)
Anesthesia Post Note  Patient: Calvin Moreno  Procedure(s) Performed: COLONOSCOPY WITH PROPOFOL (N/A )  Patient location during evaluation: PACU Anesthesia Type: General Level of consciousness: awake, oriented, awake and alert and patient cooperative Pain management: pain level controlled Vital Signs Assessment: post-procedure vital signs reviewed and stable Respiratory status: spontaneous breathing, respiratory function stable and nonlabored ventilation Cardiovascular status: blood pressure returned to baseline and stable Postop Assessment: no headache and no backache Anesthetic complications: no   No complications documented.   Last Vitals:  Vitals:   06/12/20 1002  BP: (!) 157/68  Pulse: (!) 108  Resp: (!) 21  Temp: 36.8 C  SpO2: 99%    Last Pain:  Vitals:   06/12/20 1119  TempSrc:   PainSc: 0-No pain                 Tacy Learn

## 2020-06-12 NOTE — Transfer of Care (Signed)
Immediate Anesthesia Transfer of Care Note  Patient: Calvin Moreno  Procedure(s) Performed: COLONOSCOPY WITH PROPOFOL (N/A )  Patient Location: PACU  Anesthesia Type:General  Level of Consciousness: awake, alert , oriented and patient cooperative  Airway & Oxygen Therapy: Patient Spontanous Breathing  Post-op Assessment: Report given to RN, Post -op Vital signs reviewed and stable and Patient moving all extremities  Post vital signs: Reviewed and stable  Last Vitals:  Vitals Value Taken Time  BP    Temp    Pulse    Resp    SpO2      Last Pain:  Vitals:   06/12/20 1119  TempSrc:   PainSc: 0-No pain      Patients Stated Pain Goal: 5 (65/46/50 3546)  Complications: No complications documented.

## 2020-06-12 NOTE — Discharge Instructions (Signed)
Colonoscopy Discharge Instructions  Read the instructions outlined below and refer to this sheet in the next few weeks. These discharge instructions provide you with general information on caring for yourself after you leave the hospital. Your doctor may also give you specific instructions. While your treatment has been planned according to the most current medical practices available, unavoidable complications occasionally occur. If you have any problems or questions after discharge, call Dr. Gala Romney at [phone]. ACTIVITY  You may resume your regular activity, but move at a slower pace for the next 24 hours.   Take frequent rest periods for the next 24 hours.   Walking will help get rid of the air and reduce the bloated feeling in your belly (abdomen).   No driving for 24 hours (because of the medicine (anesthesia) used during the test).    Do not sign any important legal documents or operate any machinery for 24 hours (because of the anesthesia used during the test).  NUTRITION  Drink plenty of fluids.   You may resume your normal diet as instructed by your doctor.   Begin with a light meal and progress to your normal diet. Heavy or fried foods are harder to digest and may make you feel sick to your stomach (nauseated).   Avoid alcoholic beverages for 24 hours or as instructed.  MEDICATIONS  You may resume your normal medications unless your doctor tells you otherwise.  WHAT YOU CAN EXPECT TODAY  Some feelings of bloating in the abdomen.   Passage of more gas than usual.   Spotting of blood in your stool or on the toilet paper.  IF YOU HAD POLYPS REMOVED DURING THE COLONOSCOPY:  No aspirin products for 7 days or as instructed.   No alcohol for 7 days or as instructed.   Eat a soft diet for the next 24 hours.  FINDING OUT THE RESULTS OF YOUR TEST Not all test results are available during your visit. If your test results are not back during the visit, make an appointment  with your caregiver to find out the results. Do not assume everything is normal if you have not heard from your caregiver or the medical facility. It is important for you to follow up on all of your test results.  SEEK IMMEDIATE MEDICAL ATTENTION IF:  You have more than a spotting of blood in your stool.   Your belly is swollen (abdominal distention).   You are nauseated or vomiting.   You have a temperature over 101.   You have abdominal pain or discomfort that is severe or gets worse throughout the day.   Diverticulosis information provided  No polyps found today.  Abnormal blood vessels in the rectum seen from prior radiation treatment.  These were treated.  A future colonoscopy is not recommended  Office visit with [place] in 3 months  At patient request, I called Reginold Agent at [phone] -no answer; got voicemail at [phone]-left a message.    Diverticulosis  Diverticulosis is a condition that develops when small pouches (diverticula) form in the wall of the large intestine (colon). The colon is where water is absorbed and stool (feces) is formed. The pouches form when the inside layer of the colon pushes through weak spots in the outer layers of the colon. You may have a few pouches or many of them. The pouches usually do not cause problems unless they become inflamed or infected. When this happens, the condition is called diverticulitis. What are the causes? The cause  of this condition is not known. What increases the risk? The following factors may make you more likely to develop this condition:  Being older than age 86. Your risk for this condition increases with age. Diverticulosis is rare among people younger than age 60. By age 46, many people have it.  Eating a low-fiber diet.  Having frequent constipation.  Being overweight.  Not getting enough exercise.  Smoking.  Taking over-the-counter pain medicines, like aspirin and ibuprofen.  Having a family  history of diverticulosis. What are the signs or symptoms? In most people, there are no symptoms of this condition. If you do have symptoms, they may include:  Bloating.  Cramps in the abdomen.  Constipation or diarrhea.  Pain in the lower left side of the abdomen. How is this diagnosed? Because diverticulosis usually has no symptoms, it is most often diagnosed during an exam for other colon problems. The condition may be diagnosed by:  Using a flexible scope to examine the colon (colonoscopy).  Taking an X-ray of the colon after dye has been put into the colon (barium enema).  Having a CT scan. How is this treated? You may not need treatment for this condition. Your health care provider may recommend treatment to prevent problems. You may need treatment if you have symptoms or if you previously had diverticulitis. Treatment may include:  Eating a high-fiber diet.  Taking a fiber supplement.  Taking a live bacteria supplement (probiotic).  Taking medicine to relax your colon.   Follow these instructions at home: Medicines  Take over-the-counter and prescription medicines only as told by your health care provider.  If told by your health care provider, take a fiber supplement or probiotic. Constipation prevention Your condition may cause constipation. To prevent or treat constipation, you may need to:  Drink enough fluid to keep your urine pale yellow.  Take over-the-counter or prescription medicines.  Eat foods that are high in fiber, such as beans, whole grains, and fresh fruits and vegetables.  Limit foods that are high in fat and processed sugars, such as fried or sweet foods.   General instructions  Try not to strain when you have a bowel movement.  Keep all follow-up visits as told by your health care provider. This is important. Contact a health care provider if you:  Have pain in your abdomen.  Have bloating.  Have cramps.  Have not had a bowel  movement in 3 days. Get help right away if:  Your pain gets worse.  Your bloating becomes very bad.  You have a fever or chills, and your symptoms suddenly get worse.  You vomit.  You have bowel movements that are bloody or black.  You have bleeding from your rectum. Summary  Diverticulosis is a condition that develops when small pouches (diverticula) form in the wall of the large intestine (colon).  You may have a few pouches or many of them.  This condition is most often diagnosed during an exam for other colon problems.  Treatment may include increasing the fiber in your diet, taking supplements, or taking medicines. This information is not intended to replace advice given to you by your health care provider. Make sure you discuss any questions you have with your health care provider. Document Revised: 12/09/2018 Document Reviewed: 12/09/2018 Elsevier Patient Education  2021 [place] After This sheet gives you information about how to care for yourself after your procedure. Your health care provider may also give you  more specific instructions. If you have problems or questions, contact your health care provider. What can I expect after the procedure? After the procedure, it is common to have:  Tiredness.  Forgetfulness about what happened after the procedure.  Impaired judgment for important decisions.  Nausea or vomiting.  Some difficulty with balance. Follow these instructions at home: For the time period you were told by your health care provider:  Rest as needed.  Do not participate in activities where you could fall or become injured.  Do not drive or use machinery.  Do not drink alcohol.  Do not take sleeping pills or medicines that cause drowsiness.  Do not make important decisions or sign legal documents.  Do not take care of children on your own.      Eating and drinking  Follow the diet that is  recommended by your health care provider.  Drink enough fluid to keep your urine pale yellow.  If you vomit: ? Drink water, juice, or soup when you can drink without vomiting. ? Make sure you have little or no nausea before eating solid foods. General instructions  Have a responsible adult stay with you for the time you are told. It is important to have someone help care for you until you are awake and alert.  Take over-the-counter and prescription medicines only as told by your health care provider.  If you have sleep apnea, surgery and certain medicines can increase your risk for breathing problems. Follow instructions from your health care provider about wearing your sleep device: ? Anytime you are sleeping, including during daytime naps. ? While taking prescription pain medicines, sleeping medicines, or medicines that make you drowsy.  Avoid smoking.  Keep all follow-up visits as told by your health care provider. This is important. Contact a health care provider if:  You keep feeling nauseous or you keep vomiting.  You feel light-headed.  You are still sleepy or having trouble with balance after 24 hours.  You develop a rash.  You have a fever.  You have redness or swelling around the IV site. Get help right away if:  You have trouble breathing.  You have new-onset confusion at home. Summary  For several hours after your procedure, you may feel tired. You may also be forgetful and have poor judgment.  Have a responsible adult stay with you for the time you are told. It is important to have someone help care for you until you are awake and alert.  Rest as told. Do not drive or operate machinery. Do not drink alcohol or take sleeping pills.  Get help right away if you have trouble breathing, or if you suddenly become confused. This information is not intended to replace advice given to you by your health care provider. Make sure you discuss any questions you have  with your health care provider. Document Revised: 01/26/2020 Document Reviewed: 04/14/2019 Elsevier Patient Education  2021 [employer].

## 2020-06-12 NOTE — Anesthesia Preprocedure Evaluation (Addendum)
Anesthesia Evaluation  Patient identified by MRN, date of birth, ID band Patient awake    Reviewed: Allergy & Precautions, NPO status , Patient's Chart, lab work & pertinent test results  History of Anesthesia Complications Negative for: history of anesthetic complications  Airway Mallampati: II  TM Distance: >3 FB Neck ROM: Full    Dental  (+) Dental Advisory Given, Teeth Intact   Pulmonary neg pulmonary ROS, former smoker,    Pulmonary exam normal breath sounds clear to auscultation       Cardiovascular Exercise Tolerance: Good hypertension, Pt. on medications Normal cardiovascular exam+ dysrhythmias + Valvular Problems/Murmurs AS  Rhythm:Regular Rate:Normal  1. Left ventricular ejection fraction, by estimation, is 60 to 65%. The  left ventricle has normal function. The left ventricle has no regional  wall motion abnormalities. There is moderate left ventricular hypertrophy.  Left ventricular diastolic  parameters are consistent with Grade I diastolic dysfunction (impaired  relaxation). Elevated left atrial pressure.  2. Right ventricular systolic function is normal. The right ventricular  size is normal.  3. The mitral valve is normal in structure. No evidence of mitral valve  regurgitation. No evidence of mitral stenosis.  4. The aortic valve has an indeterminant number of cusps. Aortic valve  regurgitation is mild. Moderate to severe aortic valve stenosis. Aortic  valve mean gradient measures 28.9 mmHg. Aortic valve peak gradient  measures 48.7 mmHg. Aortic valve area, by  VTI measures 0.87 cm.  5. The inferior vena cava is normal in size with greater than 50%  respiratory variability, suggesting right atrial pressure of 3 mmHg.    Neuro/Psych TIACVA negative psych ROS   GI/Hepatic Neg liver ROS, hiatal hernia,   Endo/Other  negative endocrine ROS  Renal/GU Renal InsufficiencyRenal disease      Musculoskeletal  (+) Arthritis ,   Abdominal   Peds  Hematology negative hematology ROS (+)   Anesthesia Other Findings   Reproductive/Obstetrics negative OB ROS                            Anesthesia Physical Anesthesia Plan  ASA: III  Anesthesia Plan: General   Post-op Pain Management:    Induction: Intravenous  PONV Risk Score and Plan: Propofol infusion and TIVA  Airway Management Planned: Nasal Cannula and Natural Airway  Additional Equipment:   Intra-op Plan:   Post-operative Plan:   Informed Consent: I have reviewed the patients History and Physical, chart, labs and discussed the procedure including the risks, benefits and alternatives for the proposed anesthesia with the patient or authorized representative who has indicated his/her understanding and acceptance.     Dental advisory given  Plan Discussed with: CRNA and Surgeon  Anesthesia Plan Comments:        Anesthesia Quick Evaluation

## 2020-06-13 ENCOUNTER — Encounter: Payer: Self-pay | Admitting: Internal Medicine

## 2020-06-18 ENCOUNTER — Encounter (HOSPITAL_COMMUNITY): Payer: Self-pay | Admitting: Internal Medicine

## 2020-08-14 ENCOUNTER — Other Ambulatory Visit: Payer: Self-pay

## 2020-08-14 ENCOUNTER — Ambulatory Visit (INDEPENDENT_AMBULATORY_CARE_PROVIDER_SITE_OTHER): Payer: Medicare Other | Admitting: Internal Medicine

## 2020-08-14 ENCOUNTER — Encounter: Payer: Self-pay | Admitting: Internal Medicine

## 2020-08-14 ENCOUNTER — Ambulatory Visit: Payer: Medicare Other | Admitting: Gastroenterology

## 2020-08-14 VITALS — BP 126/71 | HR 91 | Temp 97.3°F | Ht 66.0 in | Wt 178.4 lb

## 2020-08-14 DIAGNOSIS — K627 Radiation proctitis: Secondary | ICD-10-CM | POA: Diagnosis not present

## 2020-08-14 DIAGNOSIS — K5909 Other constipation: Secondary | ICD-10-CM | POA: Diagnosis not present

## 2020-08-14 DIAGNOSIS — K219 Gastro-esophageal reflux disease without esophagitis: Secondary | ICD-10-CM

## 2020-08-14 MED ORDER — PANTOPRAZOLE SODIUM 40 MG PO TBEC: 40.0000 mg | DELAYED_RELEASE_TABLET | Freq: Every day | ORAL | 4 refills | Status: DC

## 2020-08-14 NOTE — Progress Notes (Signed)
Primary Care Physician:  Lemmie Evens, MD Primary Gastroenterologist:  Dr. Gala Romney   Pre-Procedure History & Physical: HPI:  Calvin Moreno is a 80 y.o. 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 Patient states GERD well controlled on Protonix 40 mg daily.  No dysphagia.  Patient states since his colonoscopy he feels that he haas become constipated  - goes up to 3 days without a bowel movement.  Has to strain.  Has MiraLAX but does not take it. States he feels tired all the time; Neurontin was discontinued.  Made no difference.  He can fall asleep while reading the newspaper.  States he snores loudly.  His wife no longer sleeps in the same room with him.  Wakes up with a very dry mouth and does not feel he is refreshed after getting up in the morning.  No prior sleep study.  Past Medical History:  Diagnosis Date  . Aortic stenosis, mild 2000   And insufficiency, evaluation by Dr. Verl Blalock in 2000  . Arthritis   . Benign prostatic hypertrophy    PSA of 1.39 in 09/2010  . Chronic kidney disease    Borderline; creatinine of 1.6 in 08/2009, but 1.15 in 05/2010  . Chronic sinusitis    By MRI  . Diverticulosis 2013  . Hearing impaired   . Hemorrhoids 2006  . Hiatal hernia 2006  . HOH (hard of hearing)   . Hyperlipidemia    Elevated triglycerides  . Hypertension    Mild internal carotid artery plaque on MRI/MRA in 2002  . IBS (irritable bowel syndrome)   . Insomnia   . Left bundle branch block 2012   2012  . Metabolic syndrome    Fasting hyperglycemia  . Murmur   . Nephrolithiasis 08/2009   2011  . Obesity   . Proctitis    radiation induced  . Prostate cancer ([place]) 11/2010   s/p XRT  . Seasonal allergies   . Stroke [facility])    TIA, no residual, seen on MRI  . 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 Uses hearing aid    bilateral    Past Surgical History:  Procedure Laterality Date  . ANKLE FRACTURE SURGERY      left  . CATARACT EXTRACTION Left   . CATARACT EXTRACTION W/PHACO Right 12/19/2013   Procedure: CATARACT EXTRACTION PHACO AND INTRAOCULAR LENS PLACEMENT RIGHT EYE CDE=14.78;  Surgeon: Tonny Branch, MD;  Location: [facility];  Service: Ophthalmology;  Laterality: Right;  . CHOLECYSTECTOMY    . COLONOSCOPY  2006   Dr. Ward Givens mucosa throughout colon, moderate internal hemorrhoids. bx= benign colonic mucosa  . COLONOSCOPY  12/03/2011   Dr. Gala Romney- colonic diverticulosis, radiation-induced proctitis- s/p APC ablation, no microscopic colitis on bx  . COLONOSCOPY WITH PROPOFOL N/A 06/12/2020   Procedure: COLONOSCOPY WITH PROPOFOL;  Surgeon: Daneil Dolin, MD;  Location: [facility];  Service: Endoscopy;  Laterality: N/A;  2:45pm  . ESOPHAGOGASTRODUODENOSCOPY  2006   Dr. Oletta Lamas- small hiatal hernia and slightly reddened distal esophagus o/w normal  . FLEXIBLE SIGMOIDOSCOPY N/A 07/13/2013   HCW:[id number] changes of the rectum with active oozing consistent with chronic radiation proctitis.  Otherwise, negative sigmoidoscopy to 40 cm.Status post argon plasma coagulation ablation   . HOT HEMOSTASIS N/A 07/13/2013   Procedure: HOT HEMOSTASIS (ARGON PLASMA COAGULATION/BICAP);  Surgeon: Daneil Dolin, MD;  Location: [facility];  Service: Endoscopy;  Laterality: N/A;  .  Ileocolonoscopy  12/03/2011   BDZ:[id number] diverticulosis. Radiation-induced proctitis status post APC ablation. Status post segmental colon Biopsy  . TONSILLECTOMY      Prior to Admission medications   Medication Sig Start Date End Date Taking? Authorizing Provider  acyclovir (ZOVIRAX) 800 MG tablet Take 800 mg by mouth See admin instructions. Take 800 mg daily, increase to 800 mg 3 times daily as needed when having a fever blister flare   Yes [provider]  ALPRAZolam (XANAX) 0.5 MG tablet Take 0.5 mg by mouth at bedtime as needed for sleep.   Yes [provider]  Ascorbic Acid (VITAMIN C) 1000 MG tablet Take  1,000 mg by mouth daily.   Yes [provider]  aspirin EC 81 MG tablet Take 81 mg by mouth at bedtime.   Yes [provider]  clobetasol (TEMOVATE) 0.05 % external solution Apply 1 application topically daily as needed (eczema). 09/05/19  Yes [provider]  fexofenadine (ALLEGRA) 180 MG tablet Take 180 mg by mouth daily as needed for allergies.   Yes [provider]  finasteride (PROSCAR) 5 MG tablet Take 1 tablet (5 mg total) by mouth daily. 10/19/19  Yes McKenzie, Candee Furbish, MD  furosemide (LASIX) 20 MG tablet Take 20 mg by mouth daily.   Yes [provider]  hydrochlorothiazide (HYDRODIURIL) 12.5 MG tablet Take 12.5 mg by mouth daily. 01/03/20  Yes [provider]  meclizine (ANTIVERT) 25 MG tablet Take 25 mg by mouth at bedtime.   Yes [provider]  Melatonin 3 MG TABS Take 3 mg by mouth at bedtime. For sleep   Yes [provider]  naproxen sodium (ALEVE) 220 MG tablet Take 220 mg by mouth daily as needed (pain).   Yes [provider]  pantoprazole (PROTONIX) 40 MG tablet Take 40 mg by mouth daily. 11/04/18  Yes [provider]  Polyethyl Glycol-Propyl Glycol (SYSTANE OP) Place 1 drop into both eyes 2 (two) times daily.   Yes [provider]  pregabalin (LYRICA) 50 MG capsule Take 50 mg by mouth 3 (three) times daily.   Yes [provider]  PREVIDENT 5000 BOOSTER PLUS 1.1 % PSTE Place 1 application onto teeth in the morning and at bedtime. 05/03/20  Yes [provider]  simvastatin (ZOCOR) 20 MG tablet Take 20 mg by mouth every evening.   Yes [provider]  terazosin (HYTRIN) 5 MG capsule Take 5-10 mg by mouth See admin instructions. Take 5 mg in the morning and 10 mg at night   Yes [provider]  vitamin B-12 (CYANOCOBALAMIN) 1000 MCG tablet Take 2,000 mcg by mouth daily.   Yes [provider]    Allergies as of 08/14/2020 - Review Complete  08/14/2020  Allergen Reaction Noted  . Norvasc [amlodipine besylate] Swelling 09/19/2014  . Codeine Nausea And Vomiting 11/06/2011    Family History  Problem Relation Age of Onset  . Heart failure Mother   . Heart attack Father   . Heart failure Brother        bladder cancer (agent orange exposure)  . Cirrhosis Brother        chronic viral hepatitis  . Colon cancer Neg Hx     Social History   Socioeconomic History  . Marital status: Married    Spouse name: Not on file  . Number of children: 2  . Years of education: Not on file  . Highest education level: Not on file  Occupational History  .  Occupation: Retired    Fish farm manager: Engineer, [employer]  Tobacco Use  . Smoking status: Former Smoker    Packs/day: 1.50    Years: 40.00    Pack years: 60.00    Quit date: 11/04/1997    Years since quitting: 22.7  . Smokeless tobacco: Never Used  . Tobacco comment: Quit in 1999  Vaping Use  . Vaping Use: Never used  Substance and Sexual Activity  . Alcohol use: No    Alcohol/week: 0.0 standard drinks  . Drug use: No  . Sexual activity: Yes  Other Topics Concern  . Not on file  Social History Narrative   Married with 2 children   Lives locally   No regular exercise   Social Determinants of Health   Financial Resource Strain: Not on file  Food Insecurity: Not on file  Transportation Needs: Not on file  Physical Activity: Not on file  Stress: Not on file  Social Connections: Not on file  Intimate Partner Violence: Not on file    Review of Systems: See HPI, otherwise negative ROS  Physical Exam: BP 126/71   Pulse 91   Temp (!) 97.3 F (36.3 C) (Temporal)   Ht 5\' 6"  (1.676 m)   Wt 178 lb 6.4 oz (80.9 kg)   BMI 28.79 kg/m  General:   Alert,  pleasant and cooperative in NAD Abdomen: Obese.  Positive bowel sounds soft nontender but appreciable mass organomegaly  Pulses:  Normal pulses noted. Extremities:  Without clubbing or edema.  Impression/Plan: 80 year old  gentleman with GERD-well controlled on Protonix 40 mg daily.  No alarm symptoms.  History of altered bowel function.  No further rectal bleeding -  status post APC ablation of abnormal rectal blood vessels.  History of radiation proctitis.  He is now constipated.  He needs to liberalize use of MiraLAX.  No future colonoscopy recommended.  He has symptoms suspicious for obstructive sleep apnea.  Recommendations: Continue pantoprazole 40 mg daily dispense 30 or 90; with 1 year refill  Take 17 g of MiraLAX daily at bedtime as needed if no bowel movement on a given day  Future colonoscopy is not recommended  Follow-up appointment here in 1 year and as needed.  As discussed, you may have obstructive sleep apnea.  This could jeopardize her health.  Speak to Dr. Karie Kirks to see if he feels a sleep study is indicated.       Notice: This dictation was prepared with Dragon dictation along with smaller phrase technology. Any transcriptional errors that result from this process are unintentional and may not be corrected upon review.

## 2020-08-14 NOTE — Patient Instructions (Signed)
Continue pantoprazole 40 mg daily dispense 30 or 90; with 1 year refill  Take 17 g of MiraLAX daily at bedtime as needed if no bowel movement on a given day  Future colonoscopy is not recommended  Follow-up appointment here in 1 year and as needed.  As discussed, you may have obstructive sleep apnea.  This could jeopardize her health.  Speak to Dr. Karie Kirks to see if he feels a sleep study is indicated.

## 2020-10-02 ENCOUNTER — Ambulatory Visit (HOSPITAL_COMMUNITY)
Admission: RE | Admit: 2020-10-02 | Discharge: 2020-10-02 | Disposition: A | Discharge status: Home / Self Care | Payer: Medicare Other | Source: Ambulatory Visit | Attending: Urology | Admitting: Urology

## 2020-10-02 DIAGNOSIS — N200 Calculus of kidney: Secondary | ICD-10-CM | POA: Diagnosis present

## 2020-10-09 ENCOUNTER — Telehealth (INDEPENDENT_AMBULATORY_CARE_PROVIDER_SITE_OTHER): Payer: Medicare Other | Admitting: Urology

## 2020-10-09 ENCOUNTER — Encounter: Payer: Self-pay | Admitting: Urology

## 2020-10-09 ENCOUNTER — Other Ambulatory Visit: Payer: Self-pay

## 2020-10-09 DIAGNOSIS — N401 Enlarged prostate with lower urinary tract symptoms: Secondary | ICD-10-CM | POA: Diagnosis not present

## 2020-10-09 DIAGNOSIS — N138 Other obstructive and reflux uropathy: Secondary | ICD-10-CM | POA: Diagnosis not present

## 2020-10-09 DIAGNOSIS — N200 Calculus of kidney: Secondary | ICD-10-CM | POA: Diagnosis not present

## 2020-10-09 DIAGNOSIS — N400 Enlarged prostate without lower urinary tract symptoms: Secondary | ICD-10-CM | POA: Insufficient documentation

## 2020-10-09 DIAGNOSIS — R350 Frequency of micturition: Secondary | ICD-10-CM

## 2020-10-09 DIAGNOSIS — R3912 Poor urinary stream: Secondary | ICD-10-CM | POA: Insufficient documentation

## 2020-10-09 MED ORDER — MIRABEGRON ER 25 MG PO TB24: 25.0000 mg | ORAL_TABLET | Freq: Every day | ORAL | 0 refills | Status: DC

## 2020-10-09 NOTE — Progress Notes (Signed)
10/09/2020 3:14 PM   Calvin Moreno Oct 30, 1940 [id number]  Referring provider: Lemmie Evens, MD [place],  [place] [id number]   Patient location: home Physician location: office I connected with  Calvin Moreno on 10/09/20 by a video enabled telemedicine application and verified that I am speaking with the correct person using two identifiers.   I discussed the limitations of evaluation and management by telemedicine. The patient expressed understanding and agreed to proceed.    Followup BPH and nephrolithiasis  HPI: Calvin Moreno is a 80yo here for followup for nephrolithiasis and BPH. No stone events since last visit. Renal US 10/03/2020. He is currently on hytrin. He has post void dribbling. Nocturia 0x. He has urinary frequency every 1 hour. He was tried on rapaflo 8mg  which increased urinary frequency, urgency and dribbling.    PMH: Past Medical History:  Diagnosis Date  . Aortic stenosis, mild 2000   And insufficiency, evaluation by Dr. Verl Blalock in 2000  . Arthritis   . Benign prostatic hypertrophy    PSA of 1.39 in 09/2010  . Chronic kidney disease    Borderline; creatinine of 1.6 in 08/2009, but 1.15 in 05/2010  . Chronic sinusitis    By MRI  . Diverticulosis 2013  . Hearing impaired   . Hemorrhoids 2006  . Hiatal hernia 2006  . HOH (hard of hearing)   . Hyperlipidemia    Elevated triglycerides  . Hypertension    Mild internal carotid artery plaque on MRI/MRA in 2002  . IBS (irritable bowel syndrome)   . Insomnia   . Left bundle branch block 2012   2012  . Metabolic syndrome    Fasting hyperglycemia  . Murmur   . Nephrolithiasis 08/2009   2011  . Obesity   . Proctitis    radiation induced  . Prostate cancer ([place]) 11/2010   s/p XRT  . Seasonal allergies   . Stroke [facility])    TIA, no residual, seen on MRI  . 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 Uses hearing aid    bilateral    Surgical History: Past Surgical  History:  Procedure Laterality Date  . ANKLE FRACTURE SURGERY     left  . CATARACT EXTRACTION Left   . CATARACT EXTRACTION W/PHACO Right 12/19/2013   Procedure: CATARACT EXTRACTION PHACO AND INTRAOCULAR LENS PLACEMENT RIGHT EYE CDE=14.78;  Surgeon: Tonny Branch, MD;  Location: [facility] OR[facility];  Service: Ophthalmology;  Laterality: Right;  . CHOLECYSTECTOMY    . COLONOSCOPY  2006   Dr. Ward Givens mucosa throughout colon, moderate internal hemorrhoids. bx= benign colonic mucosa  . COLONOSCOPY  12/03/2011   Dr. Gala Romney- colonic diverticulosis, radiation-induced proctitis- s/p APC ablation, no microscopic colitis on bx  . COLONOSCOPY WITH PROPOFOL N/A 06/12/2020   Procedure: COLONOSCOPY WITH PROPOFOL;  Surgeon: Daneil Dolin, MD;  Location: [facility];  Service: Endoscopy;  Laterality: N/A;  2:45pm  . ESOPHAGOGASTRODUODENOSCOPY  2006   Dr. Oletta Lamas- small hiatal hernia and slightly reddened distal esophagus o/w normal  . FLEXIBLE SIGMOIDOSCOPY N/A 07/13/2013   [id number] changes of the rectum with active oozing consistent with chronic radiation proctitis.  Otherwise, negative sigmoidoscopy to 40 cm.Status post argon plasma coagulation ablation   . HOT HEMOSTASIS N/A 07/13/2013   Procedure: HOT HEMOSTASIS (ARGON PLASMA COAGULATION/BICAP);  Surgeon: Daneil Dolin, MD;  Location: [facility];  Service: Endoscopy;  Laterality: N/A;  . Ileocolonoscopy  12/03/2011   [id number] diverticulosis.  Radiation-induced proctitis status post APC ablation. Status post segmental colon Biopsy  . TONSILLECTOMY      Home Medications:  Allergies as of 10/09/2020      Reactions   Norvasc [amlodipine Besylate] Swelling   Ankle swelling   Codeine Nausea And Vomiting      Medication List       Accurate as of Oct 09, 2020  3:14 PM. If you have any questions, ask your nurse or doctor.        acyclovir 800 MG tablet Commonly known as: ZOVIRAX Take 800 mg by mouth See admin instructions. Take 800 mg daily,  increase to 800 mg 3 times daily as needed when having a fever blister flare   ALPRAZolam 0.5 MG tablet Commonly known as: XANAX Take 0.5 mg by mouth at bedtime as needed for sleep.   aspirin EC 81 MG tablet Take 81 mg by mouth at bedtime.   clobetasol 0.05 % external solution Commonly known as: TEMOVATE Apply 1 application topically daily as needed (eczema).   fexofenadine 180 MG tablet Commonly known as: ALLEGRA Take 180 mg by mouth daily as needed for allergies.   finasteride 5 MG tablet Commonly known as: PROSCAR Take 1 tablet (5 mg total) by mouth daily.   furosemide 20 MG tablet Commonly known as: LASIX Take 20 mg by mouth daily.   hydrochlorothiazide 12.5 MG tablet Commonly known as: HYDRODIURIL Take 12.5 mg by mouth daily.   meclizine 25 MG tablet Commonly known as: ANTIVERT Take 25 mg by mouth at bedtime.   melatonin 3 MG Tabs tablet Take 3 mg by mouth at bedtime. For sleep   naproxen sodium 220 MG tablet Commonly known as: ALEVE Take 220 mg by mouth daily as needed (pain).   pantoprazole 40 MG tablet Commonly known as: PROTONIX Take 40 mg by mouth daily.   pantoprazole 40 MG tablet Commonly known as: PROTONIX Take 1 tablet (40 mg total) by mouth daily.   pregabalin 50 MG capsule Commonly known as: LYRICA Take 50 mg by mouth 3 (three) times daily.   PreviDent 5000 Booster Plus 1.1 % Pste Generic drug: Sodium Fluoride Place 1 application onto teeth in the morning and at bedtime.   simvastatin 20 MG tablet Commonly known as: ZOCOR Take 20 mg by mouth every evening.   SYSTANE OP Place 1 drop into both eyes 2 (two) times daily.   terazosin 5 MG capsule Commonly known as: HYTRIN Take 5-10 mg by mouth See admin instructions. Take 5 mg in the morning and 10 mg at night   vitamin B-12 1000 MCG tablet Commonly known as: CYANOCOBALAMIN Take 2,000 mcg by mouth daily.   vitamin C 1000 MG tablet Take 1,000 mg by mouth daily.       Allergies:   Allergies  Allergen Reactions  . Norvasc [Amlodipine Besylate] Swelling    Ankle swelling  . Codeine Nausea And Vomiting    Family History: Family History  Problem Relation Age of Onset  . Heart failure Mother   . Heart attack Father   . Heart failure Brother        bladder cancer (agent orange exposure)  . Cirrhosis Brother        chronic viral hepatitis  . Colon cancer Neg Hx     Social History:  reports that he quit smoking about 22 years ago. He has a 60.00 pack-year smoking history. He has never used smokeless tobacco. He reports that he does not drink alcohol and does  not use drugs.  ROS: All other review of systems were reviewed and are negative except what is noted above in HPI  Physical Exam: There were no vitals taken for this visit.  Constitutional:  Alert and oriented, No acute distress. HEENT: [place] AT, moist mucus membranes.  Trachea midline, no masses. Cardiovascular: No clubbing, cyanosis, or edema. Respiratory: Normal respiratory effort, no increased work of breathing. GI: Abdomen is soft, nontender, nondistended, no abdominal masses GU: No CVA tenderness.  Lymph: No cervical or inguinal lymphadenopathy. Skin: No rashes, bruises or suspicious lesions. Neurologic: Grossly intact, no focal deficits, moving all 4 extremities. Psychiatric: Normal mood and affect.  Laboratory Data: Lab Results  Component Value Date   WBC 5.2 06/17/2018   HGB 15.1 06/17/2018   HCT 45.4 06/17/2018   MCV 105.3 (H) 06/17/2018   PLT 183 06/17/2018    Lab Results  Component Value Date   CREATININE 1.16 06/08/2020    No results found for: PSA  No results found for: TESTOSTERONE  Lab Results  Component Value Date   HGBA1C 5.9 06/06/2010    Urinalysis    Component Value Date/Time   COLORURINE YELLOW 06/17/2018 0626   APPEARANCEUR CLEAR 06/17/2018 0626   LABSPEC 1.023 06/17/2018 0626   PHURINE 5.0 06/17/2018 0626   GLUCOSEU NEGATIVE 06/17/2018 0626   HGBUR SMALL  (A) 06/17/2018 0626   BILIRUBINUR neg 10/19/2019 1444   KETONESUR NEGATIVE 06/17/2018 0626   PROTEINUR Negative 10/19/2019 1444   PROTEINUR NEGATIVE 06/17/2018 0626   UROBILINOGEN negative (A) 10/19/2019 1444   UROBILINOGEN 0.2 08/26/2009 0930   NITRITE neg 10/19/2019 1444   NITRITE NEGATIVE 06/17/2018 0626   LEUKOCYTESUR Negative 10/19/2019 1444    Lab Results  Component Value Date   BACTERIA RARE (A) 06/17/2018    Pertinent Imaging: Renal 10/03/2020: Images reviewed and discussed with the patient No results found for this or any previous visit.  No results found for this or any previous visit.  No results found for this or any previous visit.  No results found for this or any previous visit.  Results for orders placed during the hospital encounter of 10/02/20  US RENAL  Narrative CLINICAL DATA:  Nephrolithiasis  EXAM: RENAL / URINARY TRACT ULTRASOUND COMPLETE  COMPARISON:  08/25/2019  FINDINGS: Right Kidney:  Renal measurements: 11.1 x 5.0 x 5.2 cm = volume: 151 mL. Echogenicity within normal limits. No mass or hydronephrosis visualized. 5.4 cm simple cyst seen in the lower pole the right kidney.  Left Kidney:  Renal measurements: 12.1 x 5.2 x 5.2 cm = volume: 171 mL. Echogenicity within normal limits. No mass or hydronephrosis visualized.  Bladder:  Appears normal for degree of bladder distention.  Other:  Diffuse increased echogenicity of the visualized portions of the hepatic parenchyma are a nonspecific indicator of hepatocellular dysfunction, most commonly steatosis.  IMPRESSION: No significant sonographic abnormality of the kidneys. No sonographically evident renal calculi.   Electronically Signed By: Miachel Roux M.D. On: 10/03/2020 08:52  No results found for this or any previous visit.  No results found for this or any previous visit.  Results for orders placed during the hospital encounter of 06/17/18  CT RENAL STONE  STUDY  Narrative CLINICAL DATA:  Right flank pain starting this morning with nausea and vomiting.  EXAM: CT ABDOMEN AND PELVIS WITHOUT CONTRAST  TECHNIQUE: Multidetector CT imaging of the abdomen and pelvis was performed following the standard protocol without IV contrast.  COMPARISON:  08/26/2009  FINDINGS: Lower chest: Mitral annular  calcification. Coronary atherosclerosis. No acute finding  Hepatobiliary: No focal liver abnormality.Cholecystectomy with normal common bile duct diameter.  Pancreas: Unremarkable.  Spleen: Unremarkable.  Adrenals/Urinary Tract: Negative adrenals. Mild right hydroureteronephrosis secondary to a 3 mm stone at the iliac vessel crossing. No additional urolithiasis. Chronic and nonspecific perinephric stranding on both sides. Exophytic right lower pole renal cyst. Prominent bladder wall thickness but under distended.  Stomach/Bowel:  No obstruction. No evident bowel inflammation.  Vascular/Lymphatic: Atherosclerotic calcification of the aorta and iliacs. No mass or adenopathy.  Reproductive:Prostatic calcifications and right-sided fiducial markers.  Other: No ascites or pneumoperitoneum.  Musculoskeletal: No acute abnormalities.  Osteopenia.  IMPRESSION: Mild right hydroureteronephrosis due to a 3 mm stone at the level of the iliac crossing.   Electronically Signed By: Monte Fantasia M.D. On: 06/17/2018 07:09   Assessment & Plan:    1. Benign prostatic hyperplasia with urinary obstruction -Continue hytrin  2. Urinary frequency -we will try mirabegron 25mg  daily  3. Calculus, renal -RTC 1 year with a renal US   No follow-ups on file.  Nicolette Bang, MD  [facility] [place]

## 2020-10-09 NOTE — Patient Instructions (Signed)

## 2020-10-17 ENCOUNTER — Ambulatory Visit: Payer: Medicare Other | Admitting: Urology

## 2021-07-02 ENCOUNTER — Other Ambulatory Visit: Payer: Self-pay

## 2021-07-02 ENCOUNTER — Ambulatory Visit (HOSPITAL_COMMUNITY)
Admission: RE | Admit: 2021-07-02 | Discharge: 2021-07-02 | Disposition: A | Discharge status: Home / Self Care | Payer: Medicare Other | Source: Ambulatory Visit | Attending: Family Medicine | Admitting: Family Medicine

## 2021-07-02 ENCOUNTER — Other Ambulatory Visit (HOSPITAL_COMMUNITY): Payer: Self-pay | Admitting: Family Medicine

## 2021-07-02 DIAGNOSIS — M5410 Radiculopathy, site unspecified: Secondary | ICD-10-CM | POA: Insufficient documentation

## 2021-07-02 DIAGNOSIS — M545 Low back pain, unspecified: Secondary | ICD-10-CM

## 2021-08-05 ENCOUNTER — Other Ambulatory Visit (HOSPITAL_COMMUNITY): Payer: Self-pay | Admitting: Family Medicine

## 2021-08-05 ENCOUNTER — Other Ambulatory Visit: Payer: Self-pay | Admitting: Family Medicine

## 2021-08-05 DIAGNOSIS — M5416 Radiculopathy, lumbar region: Secondary | ICD-10-CM

## 2021-08-10 ENCOUNTER — Ambulatory Visit
Admission: EM | Admit: 2021-08-10 | Discharge: 2021-08-10 | Disposition: A | Discharge status: Home / Self Care | Payer: Medicare Other | Attending: Urgent Care | Admitting: Urgent Care

## 2021-08-10 ENCOUNTER — Other Ambulatory Visit: Payer: Self-pay

## 2021-08-10 ENCOUNTER — Encounter: Payer: Self-pay | Admitting: Emergency Medicine

## 2021-08-10 DIAGNOSIS — M79662 Pain in left lower leg: Secondary | ICD-10-CM

## 2021-08-10 DIAGNOSIS — L03116 Cellulitis of left lower limb: Secondary | ICD-10-CM | POA: Diagnosis not present

## 2021-08-10 MED ORDER — BACITRACIN ZINC 500 UNIT/GM EX OINT
1.0000 | TOPICAL_OINTMENT | Freq: Two times a day (BID) | CUTANEOUS | 0 refills | Status: DC
Start: 2021-08-10 — End: 2022-02-12

## 2021-08-10 MED ORDER — DOXYCYCLINE HYCLATE 100 MG PO CAPS: 100.0000 mg | ORAL_CAPSULE | Freq: Two times a day (BID) | ORAL | 0 refills | Status: DC

## 2021-08-10 NOTE — Discharge Instructions (Signed)
Please change your dressing 2-3 times daily. Each time you change your dressing, make sure you clean gently the wound with gentle soap and warm water. Pat your wound dry and let it air out if possible for 1-2 hours before reapplying another dressing. Apply the Bacitracin ointment. Use non-adherent (non-stick dressing). Cover it with Coban. Follow up with your regular doctor.  ? ?

## 2021-08-10 NOTE — ED Provider Notes (Signed)
?[facility] ? ? ?MRN: [id number] DOB: 14-Feb-1941 ? ?Subjective:  ? ?Calvin Moreno is a 81 y.o. male presenting for 2-week history of persistent left ankle redness, pain and weeping.  Patient cannot recall any particular injury.  He does have a history of an ankle surgery but this is remote.  No fever, nausea, vomiting.  Denies history of diabetes. ? ?No current facility-administered medications for this encounter. ? ?Current Outpatient Medications:  ?  pantoprazole (PROTONIX) 40 MG tablet, Take 1 tablet (40 mg total) by mouth daily., Disp: 90 tablet, Rfl: 4 ?  acyclovir (ZOVIRAX) 800 MG tablet, Take 800 mg by mouth See admin instructions. Take 800 mg daily, increase to 800 mg 3 times daily as needed when having a fever blister flare, Disp: , Rfl:  ?  ALPRAZolam (XANAX) 0.5 MG tablet, Take 0.5 mg by mouth at bedtime as needed for sleep., Disp: , Rfl:  ?  Ascorbic Acid (VITAMIN C) 1000 MG tablet, Take 1,000 mg by mouth daily., Disp: , Rfl:  ?  aspirin EC 81 MG tablet, Take 81 mg by mouth at bedtime., Disp: , Rfl:  ?  clobetasol (TEMOVATE) 0.05 % external solution, Apply 1 application topically daily as needed (eczema)., Disp: , Rfl:  ?  fexofenadine (ALLEGRA) 180 MG tablet, Take 180 mg by mouth daily as needed for allergies., Disp: , Rfl:  ?  finasteride (PROSCAR) 5 MG tablet, Take 1 tablet (5 mg total) by mouth daily., Disp: 90 tablet, Rfl: 3 ?  furosemide (LASIX) 20 MG tablet, Take 20 mg by mouth daily., Disp: , Rfl:  ?  hydrochlorothiazide (HYDRODIURIL) 12.5 MG tablet, Take 12.5 mg by mouth daily., Disp: , Rfl:  ?  meclizine (ANTIVERT) 25 MG tablet, Take 25 mg by mouth at bedtime., Disp: , Rfl:  ?  Melatonin 3 MG TABS, Take 3 mg by mouth at bedtime. For sleep, Disp: , Rfl:  ?  mirabegron ER (MYRBETRIQ) 25 MG TB24 tablet, Take 1 tablet (25 mg total) by mouth daily., Disp: 30 tablet, Rfl: 0 ?  naproxen sodium (ALEVE) 220 MG tablet, Take 220 mg by mouth daily as needed (pain)., Disp: , Rfl:  ?   pantoprazole (PROTONIX) 40 MG tablet, Take 40 mg by mouth daily., Disp: , Rfl:  ?  Polyethyl Glycol-Propyl Glycol (SYSTANE OP), Place 1 drop into both eyes 2 (two) times daily., Disp: , Rfl:  ?  pregabalin (LYRICA) 50 MG capsule, Take 50 mg by mouth 3 (three) times daily., Disp: , Rfl:  ?  PREVIDENT 5000 BOOSTER PLUS 1.1 % PSTE, Place 1 application onto teeth in the morning and at bedtime., Disp: , Rfl:  ?  simvastatin (ZOCOR) 20 MG tablet, Take 20 mg by mouth every evening., Disp: , Rfl:  ?  terazosin (HYTRIN) 5 MG capsule, Take 5-10 mg by mouth See admin instructions. Take 5 mg in the morning and 10 mg at night, Disp: , Rfl:  ?  vitamin B-12 (CYANOCOBALAMIN) 1000 MCG tablet, Take 2,000 mcg by mouth daily., Disp: , Rfl:   ? ?Allergies  ?Allergen Reactions  ? Norvasc [Amlodipine Besylate] Swelling  ?  Ankle swelling  ? Codeine Nausea And Vomiting  ? ? ?Past Medical History:  ?Diagnosis Date  ? Aortic stenosis, mild 2000  ? And insufficiency, evaluation by Dr. Verl Blalock in 2000  ? Arthritis   ? Benign prostatic hypertrophy   ? PSA of 1.39 in 09/2010  ? Chronic kidney disease   ? Borderline; creatinine of 1.6 in 08/2009,  but 1.15 in 05/2010  ? Chronic sinusitis   ? By MRI  ? Diverticulosis 2013  ? Hearing impaired   ? Hemorrhoids 2006  ? Hiatal hernia 2006  ? HOH (hard of hearing)   ? Hyperlipidemia   ? Elevated triglycerides  ? Hypertension   ? Mild internal carotid artery plaque on MRI/MRA in 2002  ? IBS (irritable bowel syndrome)   ? Insomnia   ? Left bundle branch block 2012  ? 2012  ? Metabolic syndrome   ? Fasting hyperglycemia  ? Murmur   ? Nephrolithiasis 08/2009  ? 2011  ? Obesity   ? Proctitis   ? radiation induced  ? Prostate cancer ([place]) 11/2010  ? s/p XRT  ? Seasonal allergies   ? Stroke [facility])   ? TIA, no residual, seen on MRI  ? Tobacco abuse, in remission   ? 60 pack years; quit in 1999  ? Uses hearing aid   ? bilateral  ?  ? ?Past Surgical History:  ?Procedure Laterality Date  ? ANKLE FRACTURE SURGERY    ? left   ? CATARACT EXTRACTION Left   ? CATARACT EXTRACTION W/PHACO Right 12/19/2013  ? Procedure: CATARACT EXTRACTION PHACO AND INTRAOCULAR LENS PLACEMENT RIGHT EYE CDE=14.78;  Surgeon: Tonny Branch, MD;  Location: [facility];  Service: Ophthalmology;  Laterality: Right;  ? CHOLECYSTECTOMY    ? COLONOSCOPY  2006  ? Dr. Ward Givens mucosa throughout colon, moderate internal hemorrhoids. bx= benign colonic mucosa  ? COLONOSCOPY  12/03/2011  ? Dr. Gala Romney- colonic diverticulosis, radiation-induced proctitis- s/p APC ablation, no microscopic colitis on bx  ? COLONOSCOPY WITH PROPOFOL N/A 06/12/2020  ? Procedure: COLONOSCOPY WITH PROPOFOL;  Surgeon: Daneil Dolin, MD;  Location: [facility];  Service: Endoscopy;  Laterality: N/A;  2:45pm  ? ESOPHAGOGASTRODUODENOSCOPY  2006  ? Dr. Oletta Lamas- small hiatal hernia and slightly reddened distal esophagus o/w normal  ? FLEXIBLE SIGMOIDOSCOPY N/A 07/13/2013  ? [id number] changes of the rectum with active oozing consistent with chronic radiation proctitis.  Otherwise, negative sigmoidoscopy to 40 cm.Status post argon plasma coagulation ablation   ? HOT HEMOSTASIS N/A 07/13/2013  ? Procedure: HOT HEMOSTASIS (ARGON PLASMA COAGULATION/BICAP);  Surgeon: Daneil Dolin, MD;  Location: [facility];  Service: Endoscopy;  Laterality: N/A;  ? Ileocolonoscopy  12/03/2011  ? [id number] diverticulosis. Radiation-induced proctitis status post APC ablation. Status post segmental colon Biopsy  ? TONSILLECTOMY    ? ? ?Family History  ?Problem Relation Age of Onset  ? Heart failure Mother   ? Heart attack Father   ? Heart failure Brother   ?     bladder cancer (agent orange exposure)  ? Cirrhosis Brother   ?     chronic viral hepatitis  ? Colon cancer Neg Hx   ? ? ?Social History  ? ?Tobacco Use  ? Smoking status: Former  ?  Packs/day: 1.50  ?  Years: 40.00  ?  Pack years: 60.00  ?  Types: Cigarettes  ?  Quit date: 11/04/1997  ?  Years since quitting: 23.7  ? Smokeless tobacco: Never  ? Tobacco comments:   ?  Quit in 1999  ?Vaping Use  ? Vaping Use: Never used  ?Substance Use Topics  ? Alcohol use: No  ?  Alcohol/week: 0.0 standard drinks  ? Drug use: No  ? ? ?ROS ? ? ?Objective:  ? ?Vitals: ?BP 116/68 (BP Location: Right Arm)   Pulse (!) 113   Temp 97.7 ?F (36.5 ?C) (Oral)  Resp 18   SpO2 95%  ? ?Physical Exam ?Constitutional:   ?   General: He is not in acute distress. ?   Appearance: Normal appearance. He is well-developed and normal weight. He is not ill-appearing, toxic-appearing or diaphoretic.  ?HENT:  ?   Head: Normocephalic and atraumatic.  ?   Right Ear: External ear normal.  ?   Left Ear: External ear normal.  ?   Nose: Nose normal.  ?   Mouth/Throat:  ?   Pharynx: Oropharynx is clear.  ?Eyes:  ?   General: No scleral icterus.    ?   Right eye: No discharge.     ?   Left eye: No discharge.  ?   Extraocular Movements: Extraocular movements intact.  ?Cardiovascular:  ?   Rate and Rhythm: Normal rate.  ?Pulmonary:  ?   Effort: Pulmonary effort is normal.  ?Musculoskeletal:  ?   Cervical back: Normal range of motion.  ?     Legs: ? ?Neurological:  ?   Mental Status: He is alert and oriented to person, place, and time.  ?Psychiatric:     ?   Mood and Affect: Mood normal.     ?   Behavior: Behavior normal.     ?   Thought Content: Thought content normal.     ?   Judgment: Judgment normal.  ? ? ?Bacitracin ointment was applied to the wound and secured with nonadherent dressing, Coban. ? ?Assessment and Plan :  ? ?PDMP not reviewed this encounter. ? ?1. Cellulitis of leg, left   ?2. Pain in left lower leg   ? ?Will cover for cellulitis with doxycycline.  Discussed wound care.  Recommended Tylenol for pain relief. Counseled patient on potential for adverse effects with medications prescribed/recommended today, ER and return-to-clinic precautions discussed, patient verbalized understanding. ? ?  ?Jaynee Eagles, PA-C ?08/10/21 1357 ? ?

## 2021-08-10 NOTE — ED Triage Notes (Signed)
Red area on side of left leg near ankle x 2 weeks. ?

## 2021-08-15 ENCOUNTER — Encounter: Payer: Self-pay | Admitting: Internal Medicine

## 2021-08-21 ENCOUNTER — Ambulatory Visit (HOSPITAL_COMMUNITY)
Admission: RE | Admit: 2021-08-21 | Discharge: 2021-08-21 | Disposition: A | Discharge status: Home / Self Care | Payer: Medicare Other | Source: Ambulatory Visit | Attending: Family Medicine | Admitting: Family Medicine

## 2021-08-21 DIAGNOSIS — M5416 Radiculopathy, lumbar region: Secondary | ICD-10-CM | POA: Insufficient documentation

## 2021-09-10 ENCOUNTER — Ambulatory Visit (INDEPENDENT_AMBULATORY_CARE_PROVIDER_SITE_OTHER): Payer: Medicare Other | Admitting: Internal Medicine

## 2021-09-10 ENCOUNTER — Encounter: Payer: Self-pay | Admitting: Internal Medicine

## 2021-09-10 VITALS — BP 124/62 | HR 98 | Temp 97.8°F | Ht 66.0 in | Wt 180.4 lb

## 2021-09-10 DIAGNOSIS — K219 Gastro-esophageal reflux disease without esophagitis: Secondary | ICD-10-CM

## 2021-09-10 DIAGNOSIS — K627 Radiation proctitis: Secondary | ICD-10-CM | POA: Diagnosis not present

## 2021-09-10 MED ORDER — PANTOPRAZOLE SODIUM 40 MG PO TBEC
40.0000 mg | DELAYED_RELEASE_TABLET | Freq: Every day | ORAL | 3 refills | Status: DC
Start: 2021-09-10 — End: 2022-03-25

## 2021-09-10 NOTE — Patient Instructions (Signed)
It was good to see you again today! ? ?GERD information provided ? ?Irritable bowel syndrome information provided ? ?Continue pantoprazole 40 mg daily-30 minutes to 1 hour before breakfast (refill #90 with 3 additional refills.  Care will pharmacy) ? 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  It is best to try something that we will help abort the attacks before they occur. ? ?To this end, I highly recommend Imodium over-the-counter 2 mg 30 minutes to an hour before you eat out in a restaurant and at other times when you feel you may have an attack. ? ?We will plan to see you back in the office in 6 months.  If Imodium is not of significant benefit for you in the interim, please call me and let me know before your next appointment. ?

## 2021-09-10 NOTE — Progress Notes (Signed)
? ? ?Primary Care Physician:  Lemmie Evens, MD ?Primary Gastroenterologist:  Dr. Gala Romney ? ?Pre-Procedure History & Physical: ?HPI:  Calvin Moreno is a 81 y.o. male here for follow-up of GERD  / alternating constipation diarrhea and rectal bleeding.  History of radiation proctitis  -  status post APC multiple sessions.  Last session a year or so ago.  Hardly ever sees any blood per rectum.  Historically constipated at times he has diarrhea.  In particular, if he goes out to eat he may have cramps and urged to have a bowel movement prior to finishing the meal.  Takes no meds for this.  Once he has a bowel movement symptoms subside.  Most the time he has normal bowel function.  Not infrequently goes a day without a bowel movement. ?Weight is stable. ?Past Medical History:  ?Diagnosis Date  ? Aortic stenosis, mild 2000  ? And insufficiency, evaluation by Dr. Verl Blalock in 2000  ? Arthritis   ? Benign prostatic hypertrophy   ? PSA of 1.39 in 09/2010  ? Chronic kidney disease   ? Borderline; creatinine of 1.6 in 08/2009, but 1.15 in 05/2010  ? Chronic sinusitis   ? By MRI  ? Diverticulosis 2013  ? Hearing impaired   ? Hemorrhoids 2006  ? Hiatal hernia 2006  ? HOH (hard of hearing)   ? Hyperlipidemia   ? Elevated triglycerides  ? Hypertension   ? Mild internal carotid artery plaque on MRI/MRA in 2002  ? IBS (irritable bowel syndrome)   ? Insomnia   ? Left bundle branch block 2012  ? 2012  ? Metabolic syndrome   ? Fasting hyperglycemia  ? Murmur   ? Nephrolithiasis 08/2009  ? 2011  ? Obesity   ? Proctitis   ? radiation induced  ? Prostate cancer ([place]) 11/2010  ? s/p XRT  ? Seasonal allergies   ? Stroke [facility])   ? TIA, no residual, seen on MRI  ? Tobacco abuse, in remission   ? 60 pack years; quit in 1999  ? Uses hearing aid   ? bilateral  ? ? ?Past Surgical History:  ?Procedure Laterality Date  ? ANKLE FRACTURE SURGERY    ? left  ? CATARACT EXTRACTION Left   ? CATARACT EXTRACTION W/PHACO Right 12/19/2013  ? Procedure: CATARACT  EXTRACTION PHACO AND INTRAOCULAR LENS PLACEMENT RIGHT EYE CDE=14.78;  Surgeon: Tonny Branch, MD;  Location: [facility];  Service: Ophthalmology;  Laterality: Right;  ? CHOLECYSTECTOMY    ? COLONOSCOPY  2006  ? Dr. Ward Givens mucosa throughout colon, moderate internal hemorrhoids. bx= benign colonic mucosa  ? COLONOSCOPY  12/03/2011  ? Dr. Gala Romney- colonic diverticulosis, radiation-induced proctitis- s/p APC ablation, no microscopic colitis on bx  ? COLONOSCOPY WITH PROPOFOL N/A 06/12/2020  ? Procedure: COLONOSCOPY WITH PROPOFOL;  Surgeon: Daneil Dolin, MD;  Location: [facility];  Service: Endoscopy;  Laterality: N/A;  2:45pm  ? ESOPHAGOGASTRODUODENOSCOPY  2006  ? Dr. Oletta Lamas- small hiatal hernia and slightly reddened distal esophagus o/w normal  ? FLEXIBLE SIGMOIDOSCOPY N/A 07/13/2013  ? [id number] changes of the rectum with active oozing consistent with chronic radiation proctitis.  Otherwise, negative sigmoidoscopy to 40 cm.Status post argon plasma coagulation ablation   ? HOT HEMOSTASIS N/A 07/13/2013  ? Procedure: HOT HEMOSTASIS (ARGON PLASMA COAGULATION/BICAP);  Surgeon: Daneil Dolin, MD;  [facility]: [facility];  Service: Endoscopy;  Laterality: N/A;  ? Ileocolonoscopy  12/03/2011  ? [id number] diverticulosis. Radiation-induced proctitis status post APC ablation. Status post  segmental colon Biopsy  ? TONSILLECTOMY    ? ? ?Prior to Admission medications   ?Medication Sig Start Date End Date Taking? Authorizing Provider  ?acyclovir (ZOVIRAX) 800 MG tablet Take 800 mg by mouth See admin instructions. Take 800 mg daily, increase to 800 mg 3 times daily as needed when having a fever blister flare   Yes [provider]  ?ALPRAZolam (XANAX) 0.5 MG tablet Take 0.5 mg by mouth at bedtime as needed for sleep.   Yes [provider]  ?Ascorbic Acid (VITAMIN C) 1000 MG tablet Take 1,000 mg by mouth daily.   Yes [provider]  ?aspirin EC 81 MG tablet Take 81 mg by mouth at bedtime.   Yes  [provider]  ?bacitracin ointment Apply 1 application. topically 2 (two) times daily. 08/10/21  Yes Jaynee Eagles, PA-C  ?clobetasol (TEMOVATE) 0.05 % external solution Apply 1 application topically daily as needed (eczema). 09/05/19  Yes [provider]  ?fexofenadine (ALLEGRA) 180 MG tablet Take 180 mg by mouth daily as needed for allergies.   Yes [provider]  ?finasteride (PROSCAR) 5 MG tablet Take 1 tablet (5 mg total) by mouth daily. 10/19/19  Yes McKenzie, Candee Furbish, MD  ?furosemide (LASIX) 20 MG tablet Take 20 mg by mouth daily.   Yes [provider]  ?hydrochlorothiazide (HYDRODIURIL) 12.5 MG tablet Take 12.5 mg by mouth daily. 01/03/20  Yes [provider]  ?meclizine (ANTIVERT) 25 MG tablet Take 25 mg by mouth at bedtime.   Yes [provider]  ?Melatonin 3 MG TABS Take 3 mg by mouth at bedtime. For sleep   Yes [provider]  ?naproxen sodium (ALEVE) 220 MG tablet Take 220 mg by mouth daily as needed (pain).   Yes [provider]  ?pantoprazole (PROTONIX) 40 MG tablet Take 40 mg by mouth daily. 11/04/18  Yes [provider]  ?Polyethyl Glycol-Propyl Glycol (SYSTANE OP) Place 1 drop into both eyes 2 (two) times daily.   Yes [provider]  ?pregabalin (LYRICA) 50 MG capsule Take 50 mg by mouth 3 (three) times daily.   Yes [provider]  ?PREVIDENT 5000 BOOSTER PLUS 1.1 % PSTE Place 1 application onto teeth in the morning and at bedtime. 05/03/20  Yes [provider]  ?simvastatin (ZOCOR) 20 MG tablet Take 20 mg by mouth every evening.   Yes [provider]  ?sulfamethoxazole-trimethoprim (BACTRIM DS) 800-160 MG tablet Take 1 tablet by mouth 2 (two) times daily. 08/31/21  Yes [provider]  ?terazosin (HYTRIN) 5 MG capsule Take 5-10 mg by mouth See admin instructions. Take 5 mg in the morning and 10 mg at night   Yes [provider]  ?vitamin B-12 (CYANOCOBALAMIN) 1000  MCG tablet Take 2,000 mcg by mouth daily.   Yes [provider]  ? ? ?Allergies as of 09/10/2021 - Review Complete 09/10/2021  ?Allergen Reaction Noted  ? Norvasc [amlodipine besylate] Swelling 09/19/2014  ? Codeine Nausea And Vomiting 11/06/2011  ? ? ?Family History  ?Problem Relation Age of Onset  ? Heart failure Mother   ? Heart attack Father   ? Heart failure Brother   ?     bladder cancer (agent orange exposure)  ? Cirrhosis Brother   ?     chronic viral hepatitis  ? Colon cancer Neg Hx   ? ? ?Social History  ? ?Socioeconomic History  ? Marital status: Married  ?  Spouse name: Not on file  ? Number  of children: 2  ? Years of education: Not on file  ? Highest education level: Not on file  ?Occupational History  ? Occupation: Retired  ?  Employer: [employer]  ?Tobacco Use  ? Smoking status: Former  ?  Packs/day: 1.50  ?  Years: 40.00  ?  Pack years: 60.00  ?  Types: Cigarettes  ?  Quit date: 11/04/1997  ?  Years since quitting: 23.8  ? Smokeless tobacco: Never  ? Tobacco comments:  ?  Quit in 1999  ?Vaping Use  ? Vaping Use: Never used  ?Substance and Sexual Activity  ? Alcohol use: No  ?  Alcohol/week: 0.0 standard drinks  ? Drug use: No  ? Sexual activity: Yes  ?Other Topics Concern  ? Not on file  ?Social History Narrative  ? Married with 2 children  ? Lives locally  ? No regular exercise  ? ?Social Determinants of Health  ? ?Financial Resource Strain: Not on file  ?Food Insecurity: Not on file  ?Transportation Needs: Not on file  ?Physical Activity: Not on file  ?Stress: Not on file  ?Social Connections: Not on file  ?Intimate Partner Violence: Not on file  ? ? ?Review of Systems: ?See HPI, otherwise negative ROS ? ?Physical Exam: ?BP 124/62 (BP Location: Right Arm, Patient Position: Sitting, Cuff Size: Normal)   Pulse 98   Temp 97.8 ?F (36.6 ?C) (Temporal)   Ht '5\' 6"'$  (1.676 m)   Wt 180 lb 6.4 oz (81.8 kg)   SpO2 96%   BMI 29.12 kg/m?  ?General:   Alert,   well-nourished, pleasant and  cooperative in NAD ?Mouth:  No deformity or lesions. ?Neck:  Supple; no masses or thyromegaly. No significant cervical adenopathy. ?Lungs:  Clear throughout to auscultation.   No wheezes, crackles, or rhonchi. No

## 2021-10-04 ENCOUNTER — Ambulatory Visit (HOSPITAL_COMMUNITY)
Admission: RE | Admit: 2021-10-04 | Discharge: 2021-10-04 | Disposition: A | Discharge status: Home / Self Care | Payer: Medicare Other | Source: Ambulatory Visit | Attending: Urology | Admitting: Urology

## 2021-10-04 DIAGNOSIS — N200 Calculus of kidney: Secondary | ICD-10-CM | POA: Insufficient documentation

## 2021-10-11 ENCOUNTER — Encounter: Payer: Self-pay | Admitting: Urology

## 2021-10-11 ENCOUNTER — Ambulatory Visit (INDEPENDENT_AMBULATORY_CARE_PROVIDER_SITE_OTHER): Payer: Medicare Other | Admitting: Urology

## 2021-10-11 VITALS — Ht 66.0 in | Wt 180.4 lb

## 2021-10-11 DIAGNOSIS — N401 Enlarged prostate with lower urinary tract symptoms: Secondary | ICD-10-CM | POA: Diagnosis not present

## 2021-10-11 DIAGNOSIS — N138 Other obstructive and reflux uropathy: Secondary | ICD-10-CM

## 2021-10-11 DIAGNOSIS — N200 Calculus of kidney: Secondary | ICD-10-CM

## 2021-10-11 DIAGNOSIS — R3912 Poor urinary stream: Secondary | ICD-10-CM

## 2021-10-11 DIAGNOSIS — R350 Frequency of micturition: Secondary | ICD-10-CM

## 2021-10-11 LAB — URINALYSIS, ROUTINE W REFLEX MICROSCOPIC
Bilirubin, UA: NEGATIVE
Glucose, UA: NEGATIVE
Ketones, UA: NEGATIVE
Leukocytes,UA: NEGATIVE
Nitrite, UA: NEGATIVE
Protein,UA: NEGATIVE
RBC, UA: NEGATIVE
Specific Gravity, UA: >1.03 — ABNORMAL HIGH (ref 1.005–1.030)
Urobilinogen, Ur: 0.2 mg/dL (ref 0.2–1.0)
pH, UA: 5.5 (ref 5.0–7.5)

## 2021-10-11 MED ORDER — ALFUZOSIN HCL ER 10 MG PO TB24
10.0000 mg | ORAL_TABLET | Freq: Every day | ORAL | 11 refills | Status: DC
Start: 2021-10-11 — End: 2022-08-13

## 2021-10-11 MED ORDER — FINASTERIDE 5 MG PO TABS
5.0000 mg | ORAL_TABLET | Freq: Every day | ORAL | 3 refills | Status: DC
Start: 2021-10-11 — End: 2022-08-13

## 2021-10-11 NOTE — Patient Instructions (Signed)

## 2021-10-11 NOTE — Progress Notes (Signed)
10/11/2021 1:02 PM   Calvin Moreno 12-May-1941 [id number]  Referring provider: Lemmie Evens, MD [place],  [place] [address]  Followup BPh and nephrolithiasis   HPI: Calvin Moreno is a 81yo here for followup for BPH with a weak stream and nephrolithiasis IPSS 22 QOl 3. He currently on hytrin '5mg'$  daily. Nocturia 2-3x. He has intermittent straining to urinate.  Renal US 10/04/2021 shows no calculi. No other complaints today   PMH: Past Medical History:  Diagnosis Date   Aortic stenosis, mild 2000   And insufficiency, evaluation by Dr. Verl Blalock in 2000   Arthritis    Benign prostatic hypertrophy    PSA of 1.39 in 09/2010   Chronic kidney disease    Borderline; creatinine of 1.6 in 08/2009, but 1.15 in 05/2010   Chronic sinusitis    By MRI   Diverticulosis 2013   Hearing impaired    Hemorrhoids 2006   Hiatal hernia 2006   HOH (hard of hearing)    Hyperlipidemia    Elevated triglycerides   Hypertension    Mild internal carotid artery plaque on MRI/MRA in 2002   IBS (irritable bowel syndrome)    Insomnia    Left bundle branch block 4166   0630   Metabolic syndrome    Fasting hyperglycemia   Murmur    Nephrolithiasis 08/2009   2011   Obesity    Proctitis    radiation induced   Prostate cancer ([place]) 11/2010   s/p XRT   Seasonal allergies    Stroke ([place])    TIA, no residual, seen on MRI   Tobacco abuse, in remission    60 pack years; quit in 1999   Uses hearing aid    bilateral    Surgical History: Past Surgical History:  Procedure Laterality Date   ANKLE FRACTURE SURGERY     left   CATARACT EXTRACTION Left    CATARACT EXTRACTION W/PHACO Right 12/19/2013   Procedure: CATARACT EXTRACTION PHACO AND INTRAOCULAR LENS PLACEMENT RIGHT EYE CDE=14.78;  Surgeon: Tonny Branch, MD;  Location: [facility];  Service: Ophthalmology;  Laterality: Right;   CHOLECYSTECTOMY     COLONOSCOPY  2006   Dr. Ward Givens mucosa throughout colon, moderate internal hemorrhoids. bx=  benign colonic mucosa   COLONOSCOPY  12/03/2011   Dr. Gala Romney- colonic diverticulosis, radiation-induced proctitis- s/p APC ablation, no microscopic colitis on bx   COLONOSCOPY WITH PROPOFOL N/A 06/12/2020   Procedure: COLONOSCOPY WITH PROPOFOL;  Surgeon: Daneil Dolin, MD;  [facility];  Service: Endoscopy;  Laterality: N/A;  2:45pm   ESOPHAGOGASTRODUODENOSCOPY  2006   Dr. Oletta Lamas- small hiatal hernia and slightly reddened distal esophagus o/w normal   FLEXIBLE SIGMOIDOSCOPY N/A 07/13/2013   [id number] changes of the rectum with active oozing consistent with chronic radiation proctitis.  Otherwise, negative sigmoidoscopy to 40 cm.Status post argon plasma coagulation ablation    HOT HEMOSTASIS N/A 07/13/2013   Procedure: HOT HEMOSTASIS (ARGON PLASMA COAGULATION/BICAP);  Surgeon: Daneil Dolin, MD;  [facility];  Service: Endoscopy;  Laterality: N/A;   Ileocolonoscopy  12/03/2011   [id number] diverticulosis. Radiation-induced proctitis status post APC ablation. Status post segmental colon Biopsy   TONSILLECTOMY      Home Medications:  Allergies as of 10/11/2021       Reactions   Norvasc [amlodipine Besylate] Swelling   Ankle swelling   Codeine Nausea And Vomiting        Medication List        Accurate  as of Oct 11, 2021  1:02 PM. If you have any questions, ask your nurse or doctor.          acyclovir 800 MG tablet Commonly known as: ZOVIRAX Take 800 mg by mouth See admin instructions. Take 800 mg daily, increase to 800 mg 3 times daily as needed when having a fever blister flare   ALPRAZolam 0.5 MG tablet Commonly known as: XANAX Take 0.5 mg by mouth at bedtime as needed for sleep.   aspirin EC 81 MG tablet Take 81 mg by mouth at bedtime.   bacitracin ointment Apply 1 application. topically 2 (two) times daily.   clobetasol 0.05 % external solution Commonly known as: TEMOVATE Apply 1 application topically daily as needed (eczema).    fexofenadine 180 MG tablet Commonly known as: ALLEGRA Take 180 mg by mouth daily as needed for allergies.   finasteride 5 MG tablet Commonly known as: PROSCAR Take 1 tablet (5 mg total) by mouth daily.   furosemide 20 MG tablet Commonly known as: LASIX Take 20 mg by mouth daily.   hydrochlorothiazide 12.5 MG tablet Commonly known as: HYDRODIURIL Take 12.5 mg by mouth daily.   meclizine 25 MG tablet Commonly known as: ANTIVERT Take 25 mg by mouth at bedtime.   melatonin 3 MG Tabs tablet Take 3 mg by mouth at bedtime. For sleep   naproxen sodium 220 MG tablet Commonly known as: ALEVE Take 220 mg by mouth daily as needed (pain).   pantoprazole 40 MG tablet Commonly known as: PROTONIX Take 1 tablet (40 mg total) by mouth daily.   pregabalin 50 MG capsule Commonly known as: LYRICA Take 50 mg by mouth 3 (three) times daily.   PreviDent 5000 Booster Plus 1.1 % Pste Generic drug: Sodium Fluoride Place 1 application onto teeth in the morning and at bedtime.   simvastatin 20 MG tablet Commonly known as: ZOCOR Take 20 mg by mouth every evening.   sulfamethoxazole-trimethoprim 800-160 MG tablet Commonly known as: BACTRIM DS Take 1 tablet by mouth 2 (two) times daily.   SYSTANE OP Place 1 drop into both eyes 2 (two) times daily.   terazosin 5 MG capsule Commonly known as: HYTRIN Take 5-10 mg by mouth See admin instructions. Take 5 mg in the morning and 10 mg at night   vitamin B-12 1000 MCG tablet Commonly known as: CYANOCOBALAMIN Take 2,000 mcg by mouth daily.   vitamin C 1000 MG tablet Take 1,000 mg by mouth daily.        Allergies:  Allergies  Allergen Reactions   Norvasc [Amlodipine Besylate] Swelling    Ankle swelling   Codeine Nausea And Vomiting    Family History: Family History  Problem Relation Age of Onset   Heart failure Mother    Heart attack Father    Heart failure Brother        bladder cancer (agent orange exposure)   Cirrhosis  Brother        chronic viral hepatitis   Colon cancer Neg Hx     Social History:  reports that he quit smoking about 23 years ago. He has a 60.00 pack-year smoking history. He has never used smokeless tobacco. He reports that he does not drink alcohol and does not use drugs.  ROS: All other review of systems were reviewed and are negative except what is noted above in HPI  Physical Exam: Ht '5\' 6"'$  (1.676 m)   Wt 180 lb 6.4 oz (81.8 kg)   BMI 29.12  kg/m   Constitutional:  Alert and oriented, No acute distress. HEENT: [place] AT, moist mucus membranes.  Trachea midline, no masses. Cardiovascular: No clubbing, cyanosis, or edema. Respiratory: Normal respiratory effort, no increased work of breathing. GI: Abdomen is soft, nontender, nondistended, no abdominal masses GU: No CVA tenderness.  Lymph: No cervical or inguinal lymphadenopathy. Skin: No rashes, bruises or suspicious lesions. Neurologic: Grossly intact, no focal deficits, moving all 4 extremities. Psychiatric: Normal mood and affect.  Laboratory Data: Lab Results  Component Value Date   WBC 5.2 06/17/2018   HGB 15.1 06/17/2018   HCT 45.4 06/17/2018   MCV 105.3 (H) 06/17/2018   PLT 183 06/17/2018    Lab Results  Component Value Date   CREATININE 1.16 06/08/2020    No results found for: PSA  No results found for: TESTOSTERONE  Lab Results  Component Value Date   HGBA1C 5.9 06/06/2010    Urinalysis    Component Value Date/Time   COLORURINE YELLOW 06/17/2018 0626   APPEARANCEUR CLEAR 06/17/2018 0626   LABSPEC 1.023 06/17/2018 0626   PHURINE 5.0 06/17/2018 0626   GLUCOSEU NEGATIVE 06/17/2018 0626   HGBUR SMALL (A) 06/17/2018 0626   BILIRUBINUR neg 10/19/2019 1444   KETONESUR NEGATIVE 06/17/2018 0626   PROTEINUR Negative 10/19/2019 1444   PROTEINUR NEGATIVE 06/17/2018 0626   UROBILINOGEN negative (A) 10/19/2019 1444   UROBILINOGEN 0.2 08/26/2009 0930   NITRITE neg 10/19/2019 1444   NITRITE NEGATIVE 06/17/2018  0626   LEUKOCYTESUR Negative 10/19/2019 1444    Lab Results  Component Value Date   BACTERIA RARE (A) 06/17/2018    Pertinent Imaging: Renal US 10/04/2021: Images reviewed and discussed with the patient  No results found for this or any previous visit.  No results found for this or any previous visit.  No results found for this or any previous visit.  No results found for this or any previous visit.  Results for orders placed during the hospital encounter of 10/04/21  Ultrasound renal complete  Narrative CLINICAL DATA:  Nephrolithiasis.  EXAM: RENAL / URINARY TRACT ULTRASOUND COMPLETE  COMPARISON:  Renal ultrasound 10/02/2020.  FINDINGS: Right Kidney:  Renal measurements: 11.2 x 4.7 x 5.5 cm = volume: 149 mL. Echogenicity within normal limits. There is no hydronephrosis. There is an exophytic cyst in the inferior pole measuring 4.6 x 4.1 x 4.3 cm, minimally decreased in size compared to the prior study.  Left Kidney:  Renal measurements: 12.6 x 5.0 x 4.8 cm = volume: 156 mL. Echogenicity within normal limits. No mass or hydronephrosis visualized.  Bladder:  Appears normal for degree of bladder distention.  Other:  None.  IMPRESSION: 1. No acute abnormality involving the kidneys. 2. Right renal cyst measuring 4.6 cm.   Electronically Signed By: Ronney Asters M.D. On: 10/07/2021 17:23  No results found for this or any previous visit.  No results found for this or any previous visit.  Results for orders placed during the hospital encounter of 06/17/18  CT RENAL STONE STUDY  Narrative CLINICAL DATA:  Right flank pain starting this morning with nausea and vomiting.  EXAM: CT ABDOMEN AND PELVIS WITHOUT CONTRAST  TECHNIQUE: Multidetector CT imaging of the abdomen and pelvis was performed following the standard protocol without IV contrast.  COMPARISON:  08/26/2009  FINDINGS: Lower chest: Mitral annular calcification. Coronary  atherosclerosis. No acute finding  Hepatobiliary: No focal liver abnormality.Cholecystectomy with normal common bile duct diameter.  Pancreas: Unremarkable.  Spleen: Unremarkable.  Adrenals/Urinary Tract: Negative adrenals. Mild right hydroureteronephrosis  secondary to a 3 mm stone at the iliac vessel crossing. No additional urolithiasis. Chronic and nonspecific perinephric stranding on both sides. Exophytic right lower pole renal cyst. Prominent bladder wall thickness but under distended.  Stomach/Bowel:  No obstruction. No evident bowel inflammation.  Vascular/Lymphatic: Atherosclerotic calcification of the aorta and iliacs. No mass or adenopathy.  Reproductive:Prostatic calcifications and right-sided fiducial markers.  Other: No ascites or pneumoperitoneum.  Musculoskeletal: No acute abnormalities.  Osteopenia.  IMPRESSION: Mild right hydroureteronephrosis due to a 3 mm stone at the level of the iliac crossing.   Electronically Signed By: Monte Fantasia M.D. On: 06/17/2018 07:09   Assessment & Plan:    1. Benign prostatic hyperplasia with urinary obstruction -continue finasteride '5mg'$  daily and start uroxatral '10mg'$  daily. - Urinalysis, Routine w reflex microscopic  2. Calculus, renal -RTC 1year with renal US  3. Urinary frequency -continue finasteride '5mg'$  daily and start uroxatral '10mg'$  daily.  4. Weak urinary stream continue finasteride '5mg'$  daily and start uroxatral '10mg'$  daily.   No follow-ups on file.  Nicolette Bang, MD  [facility] [place]

## 2021-11-12 ENCOUNTER — Ambulatory Visit: Payer: Medicare Other | Admitting: Physician Assistant

## 2021-11-15 ENCOUNTER — Ambulatory Visit (INDEPENDENT_AMBULATORY_CARE_PROVIDER_SITE_OTHER): Payer: Medicare Other | Admitting: Physician Assistant

## 2021-11-15 VITALS — BP 136/75 | HR 103 | Ht 66.0 in | Wt 180.4 lb

## 2021-11-15 DIAGNOSIS — N401 Enlarged prostate with lower urinary tract symptoms: Secondary | ICD-10-CM

## 2021-11-15 DIAGNOSIS — Z87442 Personal history of urinary calculi: Secondary | ICD-10-CM

## 2021-11-15 DIAGNOSIS — N138 Other obstructive and reflux uropathy: Secondary | ICD-10-CM | POA: Diagnosis not present

## 2021-11-15 DIAGNOSIS — R3912 Poor urinary stream: Secondary | ICD-10-CM

## 2021-11-15 DIAGNOSIS — N200 Calculus of kidney: Secondary | ICD-10-CM

## 2021-11-15 LAB — URINALYSIS, ROUTINE W REFLEX MICROSCOPIC
Bilirubin, UA: NEGATIVE
Glucose, UA: NEGATIVE
Ketones, UA: NEGATIVE
Leukocytes,UA: NEGATIVE
Nitrite, UA: NEGATIVE
Protein,UA: NEGATIVE
RBC, UA: NEGATIVE
Specific Gravity, UA: <1.005 — ABNORMAL LOW (ref 1.005–1.030)
Urobilinogen, Ur: 0.2 mg/dL (ref 0.2–1.0)
pH, UA: 5 (ref 5.0–7.5)

## 2021-11-15 LAB — BLADDER SCAN AMB NON-IMAGING: Scan Result: 43

## 2021-11-15 NOTE — Progress Notes (Signed)
Assessment: 1. Benign prostatic hyperplasia with urinary obstruction - BLADDER SCAN AMB NON-IMAGING - Urinalysis, Routine w reflex microscopic  2. Weak urinary stream  3. Calculus, renal    Plan: As the patient's symptoms are improving, he will continue Uroxatrol and keep appointment for UA and PVR as scheduled in 3 months.   Chief Complaint: No chief complaint on file.   HPI: Calvin Moreno is a 81 y.o. male who presents for continued evaluation of BPH and LUTS. Pt started on Uroxatrol one month ago and discontinued Hytrin at his last visit.  He is tolerating the medication well and has seen some improvement in his LUTS, but continues to have weak stream and 4 voids per night.  No dysuria, gross hematuria. IPSS = 20, quality-of-life 3.  The patient had multiple questions concerning his medication list which were discussed at length.  Questions also answered concerning late effects previous radiation. No stone sxs at this time. Last [place] 5/23  Urine clear PVR 43 mL  10/11/21 Calvin Moreno is a 81yo here for followup for BPH with a weak stream and nephrolithiasis IPSS 22 QOl 3. He currently on hytrin '5mg'$  daily. Nocturia 2-3x. He has intermittent straining to urinate.  Renal US 10/04/2021 shows no calculi. No other complaints today Portions of the above documentation were copied from a prior visit for review purposes only.  Allergies: Allergies  Allergen Reactions   Norvasc [Amlodipine Besylate] Swelling    Ankle swelling   Codeine Nausea And Vomiting    PMH: Past Medical History:  Diagnosis Date   Aortic stenosis, mild 2000   And insufficiency, evaluation by Dr. Verl Blalock in 2000   Arthritis    Benign prostatic hypertrophy    PSA of 1.39 in 09/2010   Chronic kidney disease    Borderline; creatinine of 1.6 in 08/2009, but 1.15 in 05/2010   Chronic sinusitis    By MRI   Diverticulosis 2013   Hearing impaired    Hemorrhoids 2006   Hiatal hernia 2006   HOH (hard of hearing)     Hyperlipidemia    Elevated triglycerides   Hypertension    Mild internal carotid artery plaque on MRI/MRA in 2002   IBS (irritable bowel syndrome)    Insomnia    Left bundle branch block 6834   1962   Metabolic syndrome    Fasting hyperglycemia   Murmur    Nephrolithiasis 08/2009   2011   Obesity    Proctitis    radiation induced   Prostate cancer ([place]) 11/2010   s/p XRT   Seasonal allergies    Stroke ([place])    TIA, no residual, seen on MRI   Tobacco abuse, in remission    60 pack years; quit in 1999   Uses hearing aid    bilateral    PSH: Past Surgical History:  Procedure Laterality Date   ANKLE FRACTURE SURGERY     left   CATARACT EXTRACTION Left    CATARACT EXTRACTION W/PHACO Right 12/19/2013   Procedure: CATARACT EXTRACTION PHACO AND INTRAOCULAR LENS PLACEMENT RIGHT EYE CDE=14.78;  Surgeon: Tonny Branch, MD;  Location: [facility];  Service: Ophthalmology;  Laterality: Right;   CHOLECYSTECTOMY     COLONOSCOPY  2006   Dr. Ward Givens mucosa throughout colon, moderate internal hemorrhoids. bx= benign colonic mucosa   COLONOSCOPY  12/03/2011   Dr. Gala Romney- colonic diverticulosis, radiation-induced proctitis- s/p APC ablation, no microscopic colitis on bx   COLONOSCOPY WITH PROPOFOL N/A 06/12/2020   Procedure: COLONOSCOPY  WITH PROPOFOL;  Surgeon: Daneil Dolin, MD;  Location: [facility];  Service: Endoscopy;  Laterality: N/A;  2:45pm   ESOPHAGOGASTRODUODENOSCOPY  2006   Dr. Oletta Lamas- small hiatal hernia and slightly reddened distal esophagus o/w normal   FLEXIBLE SIGMOIDOSCOPY N/A 07/13/2013   QMV:[id number] changes of the rectum with active oozing consistent with chronic radiation proctitis.  Otherwise, negative sigmoidoscopy to 40 cm.Status post argon plasma coagulation ablation    HOT HEMOSTASIS N/A 07/13/2013   Procedure: HOT HEMOSTASIS (ARGON PLASMA COAGULATION/BICAP);  Surgeon: Daneil Dolin, MD;  Location: [facility];  Service: Endoscopy;  Laterality: N/A;    Ileocolonoscopy  12/03/2011   UXL:[id number] diverticulosis. Radiation-induced proctitis status post APC ablation. Status post segmental colon Biopsy   TONSILLECTOMY      SH: Social History   Tobacco Use   Smoking status: Former    Packs/day: 1.50    Years: 40.00    Total pack years: 60.00    Types: Cigarettes    Quit date: 11/04/1997    Years since quitting: 24.0   Smokeless tobacco: Never   Tobacco comments:    Quit in 1999  Vaping Use   Vaping Use: Never used  Substance Use Topics   Alcohol use: No    Alcohol/week: 0.0 standard drinks of alcohol   Drug use: No    ROS: All other review of systems were reviewed and are negative except what is noted above in HPI  PE: BP 136/75   Pulse (!) 103   Ht '5\' 6"'$  (1.676 m)   Wt 180 lb 6.4 oz (81.8 kg)   BMI 29.12 kg/m  GENERAL APPEARANCE:  Well appearing, well developed, well nourished, NAD HEENT:  Atraumatic, normocephalic NECK:  Supple. Trachea midline ABDOMEN:  Soft, non-tender, no masses EXTREMITIES:  Moves all extremities well NEUROLOGIC:  Alert and oriented x 3, normal gait, CN II-XII grossly intact MENTAL STATUS:  appropriate BACK:  Non-tender to palpation, No CVAT SKIN:  Warm, dry, and intact   Results: Laboratory Data: Lab Results  Component Value Date   WBC 5.2 06/17/2018   HGB 15.1 06/17/2018   HCT 45.4 06/17/2018   MCV 105.3 (H) 06/17/2018   PLT 183 06/17/2018    Lab Results  Component Value Date   CREATININE 1.16 06/08/2020    Lab Results  Component Value Date   HGBA1C 5.9 06/06/2010    Urinalysis    Component Value Date/Time   COLORURINE YELLOW 06/17/2018 0626   APPEARANCEUR Clear 10/11/2021 1253   LABSPEC 1.023 06/17/2018 0626   PHURINE 5.0 06/17/2018 0626   GLUCOSEU Negative 10/11/2021 1253   HGBUR SMALL (A) 06/17/2018 0626   BILIRUBINUR Negative 10/11/2021 1253   KETONESUR NEGATIVE 06/17/2018 0626   PROTEINUR Negative 10/11/2021 1253   PROTEINUR NEGATIVE 06/17/2018 0626    UROBILINOGEN negative (A) 10/19/2019 1444   UROBILINOGEN 0.2 08/26/2009 0930   NITRITE Negative 10/11/2021 1253   NITRITE NEGATIVE 06/17/2018 0626   LEUKOCYTESUR Negative 10/11/2021 1253    Lab Results  Component Value Date   LABMICR Comment 10/11/2021   BACTERIA RARE (A) 06/17/2018    Pertinent Imaging: No results found for this or any previous visit.  No results found for this or any previous visit.  No results found for this or any previous visit.  No results found for this or any previous visit.  Results for orders placed during the hospital encounter of 10/04/21  Ultrasound renal complete  Narrative CLINICAL DATA:  Nephrolithiasis.  EXAM: RENAL / URINARY TRACT  ULTRASOUND COMPLETE  COMPARISON:  Renal ultrasound 10/02/2020.  FINDINGS: Right Kidney:  Renal measurements: 11.2 x 4.7 x 5.5 cm = volume: 149 mL. Echogenicity within normal limits. There is no hydronephrosis. There is an exophytic cyst in the inferior pole measuring 4.6 x 4.1 x 4.3 cm, minimally decreased in size compared to the prior study.  Left Kidney:  Renal measurements: 12.6 x 5.0 x 4.8 cm = volume: 156 mL. Echogenicity within normal limits. No mass or hydronephrosis visualized.  Bladder:  Appears normal for degree of bladder distention.  Other:  None.  IMPRESSION: 1. No acute abnormality involving the kidneys. 2. Right renal cyst measuring 4.6 cm.   Electronically Signed By: Ronney Asters M.D. On: 10/07/2021 17:23  No results found for this or any previous visit.  No results found for this or any previous visit.  Results for orders placed during the hospital encounter of 06/17/18  CT RENAL STONE STUDY  Narrative CLINICAL DATA:  Right flank pain starting this morning with nausea and vomiting.  EXAM: CT ABDOMEN AND PELVIS WITHOUT CONTRAST  TECHNIQUE: Multidetector CT imaging of the abdomen and pelvis was performed following the standard protocol without IV  contrast.  COMPARISON:  08/26/2009  FINDINGS: Lower chest: Mitral annular calcification. Coronary atherosclerosis. No acute finding  Hepatobiliary: No focal liver abnormality.Cholecystectomy with normal common bile duct diameter.  Pancreas: Unremarkable.  Spleen: Unremarkable.  Adrenals/Urinary Tract: Negative adrenals. Mild right hydroureteronephrosis secondary to a 3 mm stone at the iliac vessel crossing. No additional urolithiasis. Chronic and nonspecific perinephric stranding on both sides. Exophytic right lower pole renal cyst. Prominent bladder wall thickness but under distended.  Stomach/Bowel:  No obstruction. No evident bowel inflammation.  Vascular/Lymphatic: Atherosclerotic calcification of the aorta and iliacs. No mass or adenopathy.  Reproductive:Prostatic calcifications and right-sided fiducial markers.  Other: No ascites or pneumoperitoneum.  Musculoskeletal: No acute abnormalities.  Osteopenia.  IMPRESSION: Mild right hydroureteronephrosis due to a 3 mm stone at the level of the iliac crossing.   Electronically Signed By: Monte Fantasia M.D. On: 06/17/2018 07:09  No results found for this or any previous visit (from the past 24 hour(s)).

## 2021-11-15 NOTE — Progress Notes (Signed)
post void residual =43mL 

## 2021-11-28 ENCOUNTER — Telehealth: Payer: Self-pay

## 2021-11-28 ENCOUNTER — Other Ambulatory Visit: Payer: Self-pay

## 2021-11-28 DIAGNOSIS — I739 Peripheral vascular disease, unspecified: Secondary | ICD-10-CM

## 2021-11-28 NOTE — Telephone Encounter (Signed)
Pt's son, [place], called stating that pt had been referred to see Dr Donnetta Hutching in [place], but had not yet received a phone call to schedule.   Reviewed pt's chart, returned call, two identifiers used. Appts for both [place] and Dr Early made for 7/26. [place] order placed and linked, referral linked. Confirmed understanding.

## 2021-12-15 NOTE — Progress Notes (Signed)
Cardiology Office Note   Date:  12/16/2021   ID:  Calvin Moreno, DOB 1940-08-12, MRN [id number]  PCP:  Calvin Evens, MD  Cardiologist:  Dr. Zandra Abts     Chief Complaint  Patient presents with   Aortic Stenosis      History of Present Illness: Calvin Moreno is a 81 y.o. male who presents for eval of his AS.   He has a hx of AS, HTN, HLD, chronic LBBB.     Last OV 2021, AS with mod to severe AS  (mean grad 22, AVA VTI 1, dimensionless index 0.35). Criteria would suggest more moderate AS   - 07/2018 echo :LVEF >65%, mean grad 25, AVA VTI 0.65, dimensionless index 0.33, SVI    Today he is feeling well.  No SOB, no chest pain, no lightheadedness and no syncope  we discussed those be signs the aortic valve may be tighter.  He has been seen by GI for IBS and is on protonix.  Seems better.  Also with back pain.  Also with wound on Lt ankle that took some time to heal and concern for PAD.  Last dopplers 2020 were normal.    He is to see Dr. Donnetta Hutching this week for follow up.    Past Medical History:  Diagnosis Date   Aortic stenosis, mild 2000   And insufficiency, evaluation by Dr. Verl Blalock in 2000   Arthritis    Benign prostatic hypertrophy    PSA of 1.39 in 09/2010   Chronic kidney disease    Borderline; creatinine of 1.6 in 08/2009, but 1.15 in 05/2010   Chronic sinusitis    By MRI   Diverticulosis 2013   Hearing impaired    Hemorrhoids 2006   Hiatal hernia 2006   HOH (hard of hearing)    Hyperlipidemia    Elevated triglycerides   Hypertension    Mild internal carotid artery plaque on MRI/MRA in 2002   IBS (irritable bowel syndrome)    Insomnia    Left bundle branch block 2130   8657   Metabolic syndrome    Fasting hyperglycemia   Murmur    Nephrolithiasis 08/2009   2011   Obesity    Proctitis    radiation induced   Prostate cancer ([place]) 11/2010   s/p XRT   Seasonal allergies    Stroke ([place])    TIA, no residual, seen on MRI   Tobacco abuse, in remission    60  pack years; quit in 1999   Uses hearing aid    bilateral    Past Surgical History:  Procedure Laterality Date   ANKLE FRACTURE SURGERY     left   CATARACT EXTRACTION Left    CATARACT EXTRACTION W/PHACO Right 12/19/2013   Procedure: CATARACT EXTRACTION PHACO AND INTRAOCULAR LENS PLACEMENT RIGHT EYE CDE=14.78;  Surgeon: Tonny Branch, MD;  Location: [facility];  Service: Ophthalmology;  Laterality: Right;   CHOLECYSTECTOMY     COLONOSCOPY  2006   Dr. Ward Givens mucosa throughout colon, moderate internal hemorrhoids. bx= benign colonic mucosa   COLONOSCOPY  12/03/2011   Dr. Gala Romney- colonic diverticulosis, radiation-induced proctitis- s/p APC ablation, no microscopic colitis on bx   COLONOSCOPY WITH PROPOFOL N/A 06/12/2020   Procedure: COLONOSCOPY WITH PROPOFOL;  Surgeon: Calvin Dolin, MD;  [facility];  Service: Endoscopy;  Laterality: N/A;  2:45pm   ESOPHAGOGASTRODUODENOSCOPY  2006   Dr. Oletta Lamas- small hiatal hernia and slightly reddened distal esophagus o/w normal   FLEXIBLE  SIGMOIDOSCOPY N/A 07/13/2013   WLS:[id number] changes of the rectum with active oozing consistent with chronic radiation proctitis.  Otherwise, negative sigmoidoscopy to 40 cm.Status post argon plasma coagulation ablation    HOT HEMOSTASIS N/A 07/13/2013   Procedure: HOT HEMOSTASIS (ARGON PLASMA COAGULATION/BICAP);  Surgeon: Calvin Dolin, MD;  [facility];  Service: Endoscopy;  Laterality: N/A;   Ileocolonoscopy  12/03/2011   LXB:WIOMBTD diverticulosis. Radiation-induced proctitis status post APC ablation. Status post segmental colon Biopsy   TONSILLECTOMY       Current Outpatient Medications  Medication Sig Dispense Refill   acyclovir (ZOVIRAX) 800 MG tablet Take 800 mg by mouth See admin instructions. Take 800 mg daily, increase to 800 mg 3 times daily as needed when having a fever blister flare     alfuzosin (UROXATRAL) 10 MG 24 hr tablet Take 1 tablet (10 mg total) by mouth at bedtime. 30  tablet 11   ALPRAZolam (XANAX) 0.5 MG tablet Take 0.5 mg by mouth at bedtime as needed for sleep.     Ascorbic Acid (VITAMIN C) 1000 MG tablet Take 1,000 mg by mouth daily.     aspirin EC 81 MG tablet Take 81 mg by mouth at bedtime.     clobetasol (TEMOVATE) 0.05 % external solution Apply 1 application topically daily as needed (eczema).     fexofenadine (ALLEGRA) 180 MG tablet Take 180 mg by mouth daily as needed for allergies.     finasteride (PROSCAR) 5 MG tablet Take 1 tablet (5 mg total) by mouth daily. 90 tablet 3   furosemide (LASIX) 40 MG tablet Take 40 mg by mouth daily.     hydrochlorothiazide (HYDRODIURIL) 12.5 MG tablet Take 12.5 mg by mouth daily.     meclizine (ANTIVERT) 25 MG tablet Take 25 mg by mouth at bedtime.     Melatonin 3 MG TABS Take 3 mg by mouth at bedtime. For sleep     pantoprazole (PROTONIX) 40 MG tablet Take 1 tablet (40 mg total) by mouth daily. 90 tablet 3   Polyethyl Glycol-Propyl Glycol (SYSTANE OP) Place 1 drop into both eyes 2 (two) times daily.     simvastatin (ZOCOR) 20 MG tablet Take 20 mg by mouth every evening.     sulfamethoxazole-trimethoprim (BACTRIM DS) 800-160 MG tablet Take 1 tablet by mouth 2 (two) times daily.     vitamin B-12 (CYANOCOBALAMIN) 1000 MCG tablet Take 2,000 mcg by mouth daily.     bacitracin ointment Apply 1 application. topically 2 (two) times daily. (Patient not taking: Reported on 12/16/2021) 120 g 0   pregabalin (LYRICA) 50 MG capsule Take 50 mg by mouth 3 (three) times daily. (Patient not taking: Reported on 10/11/2021)     No current facility-administered medications for this visit.    Allergies:   Norvasc [amlodipine besylate], Codeine, and Amoxicillin    Social History:  The patient  reports that he quit smoking about 24 years ago. His smoking use included cigarettes. He has a 60.00 pack-year smoking history. He has never used smokeless tobacco. He reports that he does not drink alcohol and does not use drugs.   Family  History:  The patient's family history includes Cirrhosis in his brother; Heart attack in his father; Heart failure in his brother and mother.    ROS:  General:no colds or fevers, no weight changes Skin:no rashes or ulcers HEENT:no blurred vision, no congestion CV:see HPI PUL:see HPI GI:no diarrhea constipation or melena, no indigestion GU:no hematuria, no dysuria MS:no joint pain,  no claudication Neuro:no syncope, no lightheadedness Endo:no diabetes, no thyroid disease  Wt Readings from Last 3 Encounters:  12/16/21 172 lb (78 kg)  11/15/21 180 lb 6.4 oz (81.8 kg)  10/11/21 180 lb 6.4 oz (81.8 kg)     PHYSICAL EXAM: VS:  BP 124/76   Pulse 97   Ht '5\' 6"'$  (1.676 m)   Wt 172 lb (78 kg)   SpO2 95%   BMI 27.76 kg/m  , BMI Body mass index is 27.76 kg/m. General:Pleasant affect, NAD Skin:Warm and dry, brisk capillary refill HEENT:normocephalic, sclera clear, mucus membranes moist Neck:supple, no JVD, + Lt neck bruit  Heart:S1S2 RRR with 3/6 systolic murmur, no  gallup, rub or click Lungs:clear without rales, rhonchi, or wheezes AJO:INOM, non tender, + BS, do not palpate liver spleen or masses Ext:no lower ext edema,  2+ radial pulses, small wounds on Lt lower leg  Neuro:alert and oriented X 3, MAE, follows commands, + facial symmetry    EKG:  EKG is ordered today. The ekg ordered today demonstrates SR with sinus arrhythmia, LBBB old and no acute changes.    Recent Labs: No results found for requested labs within last 365 days.    Lipid Panel    Component Value Date/Time   CHOL 170 06/06/2010 0000   TRIG 177 06/06/2010 0000   HDL 34 06/06/2010 0000   LDLCALC 101 06/06/2010 0000       Other studies Reviewed: Additional studies/ records that were reviewed today include: . TTE 01/24/20 IMPRESSIONS     1. Left ventricular ejection fraction, by estimation, is 60 to 65%. The  left ventricle has normal function. The left ventricle has no regional  wall motion  abnormalities. There is moderate left ventricular hypertrophy.  Left ventricular diastolic  parameters are consistent with Grade I diastolic dysfunction (impaired  relaxation). Elevated left atrial pressure.   2. Right ventricular systolic function is normal. The right ventricular  size is normal.   3. The mitral valve is normal in structure. No evidence of mitral valve  regurgitation. No evidence of mitral stenosis.   4. The aortic valve has an indeterminant number of cusps. Aortic valve  regurgitation is mild. Moderate to severe aortic valve stenosis. Aortic  valve mean gradient measures 28.9 mmHg. Aortic valve peak gradient  measures 48.7 mmHg. Aortic valve area, by  VTI measures 0.87 cm.   5. The inferior vena cava is normal in size with greater than 50%  respiratory variability, suggesting right atrial pressure of 3 mmHg.   FINDINGS   Left Ventricle: Left ventricular ejection fraction, by estimation, is 60  to 65%. The left ventricle has normal function. The left ventricle has no  regional wall motion abnormalities. The left ventricular internal cavity  size was normal in size. There is   moderate left ventricular hypertrophy. Left ventricular diastolic  parameters are consistent with Grade I diastolic dysfunction (impaired  relaxation). Elevated left atrial pressure.   Right Ventricle: The right ventricular size is normal. No increase in  right ventricular wall thickness. Right ventricular systolic function is  normal.   Left Atrium: Left atrial size was normal in size.   Right Atrium: Right atrial size was normal in size.   Pericardium: There is no evidence of pericardial effusion.   Mitral Valve: The mitral valve is normal in structure. There is mild  thickening of the mitral valve leaflet(s). There is mild calcification of  the mitral valve leaflet(s). Moderate mitral annular calcification. No  evidence of mitral  valve regurgitation.  No evidence of mitral valve  stenosis. MV peak gradient, 11.7 mmHg. The  mean mitral valve gradient is 3.0 mmHg.   Tricuspid Valve: The tricuspid valve is normal in structure. Tricuspid  valve regurgitation is not demonstrated. No evidence of tricuspid  stenosis.   Aortic Valve: The aortic valve has an indeterminant number of cusps. .  There is severe thickening and severe calcifcation of the aortic valve.  Aortic valve regurgitation is mild. Aortic regurgitation PHT measures 558  msec. Moderate to severe aortic  stenosis is present. Severe aortic valve annular calcification. There is  severe thickening of the aortic valve. There is severe calcifcation of the  aortic valve. Aortic valve mean gradient measures 28.9 mmHg. Aortic valve  peak gradient measures 48.7  mmHg. Aortic valve area, by VTI measures 0.87 cm.   Pulmonic Valve: The pulmonic valve was not well visualized. Pulmonic valve  regurgitation is trivial. No evidence of pulmonic stenosis.   Aorta: The aortic root is normal in size and structure.   Pulmonary Artery: Indeterminant PASP, inadequate TR jet.   Venous: The inferior vena cava is normal in size with greater than 50%  respiratory variability, suggesting right atrial pressure of 3 mmHg.   IAS/Shunts: No atrial level shunt detected by color flow Doppler.      ASSESSMENT AND PLAN:  1.  Aortic stenosis at least mod in 2021, will check echo to monitor.  No symptoms of CHF, syncope or lightheadedness.  Follow up in 1 year unless abnormal echo,  we discussed symptoms of AS.  2. Carotid bruit possible radiation of AS.  Pt to see Dr. Donnetta Hutching this week for PAD, if no carotid dopplers ordered will order.  3. HTN controlled continue meds  4.  HLD on statin followed by Dr. Karie Kirks.  Other labs hgb 15 in Jan.     5. Chronic LBBB stable   Current medicines are reviewed with the patient today.  The patient Has no concerns regarding medicines.  The following changes have been made:  See above Labs/  tests ordered today include:see above  Disposition:   FU:  see above  Signed, Cecilie Kicks, NP  12/16/2021 3:34 PM    [facility] [place], [place], [place], [place] Phone: [phone]; Fax: [phone]

## 2021-12-16 ENCOUNTER — Encounter: Payer: Self-pay | Admitting: Cardiology

## 2021-12-16 ENCOUNTER — Ambulatory Visit (INDEPENDENT_AMBULATORY_CARE_PROVIDER_SITE_OTHER): Payer: Medicare Other | Admitting: Cardiology

## 2021-12-16 VITALS — BP 124/76 | HR 97 | Ht 66.0 in | Wt 172.0 lb

## 2021-12-16 DIAGNOSIS — I350 Nonrheumatic aortic (valve) stenosis: Secondary | ICD-10-CM | POA: Diagnosis not present

## 2021-12-16 DIAGNOSIS — E782 Mixed hyperlipidemia: Secondary | ICD-10-CM

## 2021-12-16 DIAGNOSIS — I447 Left bundle-branch block, unspecified: Secondary | ICD-10-CM

## 2021-12-16 DIAGNOSIS — I10 Essential (primary) hypertension: Secondary | ICD-10-CM

## 2021-12-16 NOTE — Patient Instructions (Signed)
Medication Instructions:  Your physician recommends that you continue on your current medications as directed. Please refer to the Current Medication list given to you today.   Labwork: None  Testing/Procedures: Your physician has requested that you have an echocardiogram. Echocardiography is a painless test that uses sound waves to create images of your heart. It provides your doctor with information about the size and shape of your heart and how well your heart's chambers and valves are working. This procedure takes approximately one hour. There are no restrictions for this procedure.   Follow-Up: Follow up with Cecilie Kicks, NP in 1 year.   Any Other Special Instructions Will Be Listed Below (If Applicable).     If you need a refill on your cardiac medications before your next appointment, please call your pharmacy.

## 2021-12-18 ENCOUNTER — Ambulatory Visit (INDEPENDENT_AMBULATORY_CARE_PROVIDER_SITE_OTHER): Payer: Medicare Other | Admitting: Vascular Surgery

## 2021-12-18 ENCOUNTER — Ambulatory Visit (INDEPENDENT_AMBULATORY_CARE_PROVIDER_SITE_OTHER): Payer: Medicare Other

## 2021-12-18 ENCOUNTER — Encounter: Payer: Self-pay | Admitting: Vascular Surgery

## 2021-12-18 VITALS — BP 138/81 | HR 102 | Temp 99.0°F | Resp 16 | Ht 66.0 in | Wt 171.8 lb

## 2021-12-18 DIAGNOSIS — I739 Peripheral vascular disease, unspecified: Secondary | ICD-10-CM | POA: Diagnosis not present

## 2021-12-18 NOTE — Progress Notes (Signed)
[facility] [place]  Patient name: Calvin Moreno MRN: [id number] DOB: 1941/02/22 Sex: male  REASON FOR CONSULT: Evaluate arterial flow in lower extremities  HPI: Calvin Moreno is a 81 y.o. male, who is here today for evaluation of possible lower extremity arterial insufficiency.  He has slow to heal abrasions on the medial aspect of his left leg and does have bilateral swelling left greater than right.  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  He does report claudication symptoms.  He reports that he does have chronic low back pain and this is aggravated by standing and by walking.  He also reports that if he walks any distance that his hips and thighs and calves "give out" if he rests for several minutes this discomfort goes away.  Past Medical History:  Diagnosis Date   Aortic stenosis, mod 2021   And insufficiency, evaluation by Dr. Verl Blalock in 2000   Arthritis    Benign prostatic hypertrophy    PSA of 1.39 in 09/2010   Chronic kidney disease    Borderline; creatinine of 1.6 in 08/2009, but 1.15 in 05/2010   Chronic sinusitis    By MRI   Diverticulosis 2013   Hearing impaired    Hemorrhoids 2006   Hiatal hernia 2006   HOH (hard of hearing)    Hyperlipidemia    Elevated triglycerides   Hypertension    Mild internal carotid artery plaque on MRI/MRA in 2002   IBS (irritable bowel syndrome)    Insomnia    Left bundle branch block 0370   4888   Metabolic syndrome    Fasting hyperglycemia   Murmur    Nephrolithiasis 08/2009   2011   Obesity    Proctitis    radiation induced   Prostate cancer ([facility]) 11/2010   s/p XRT   Seasonal allergies    Stroke (HCC)    TIA, no residual, seen on MRI   Tobacco abuse, in remission    60 pack years; quit in 1999   Uses hearing aid    bilateral    Family History  Problem Relation Age of Onset   Heart failure Mother    Heart attack Father    Heart failure Brother        bladder  cancer (agent orange exposure)   Cirrhosis Brother        chronic viral hepatitis   Colon cancer Neg Hx     SOCIAL HISTORY: Social History   Socioeconomic History   Marital status: Married    Spouse name: Not on file   Number of children: 2   Years of education: Not on file   Highest education level: Not on file  Occupational History   Occupation: Retired    Fish farm manager: [employer]  Tobacco Use   Smoking status: Former    Packs/day: 1.50    Years: 40.00    Total pack years: 60.00    Types: Cigarettes    Quit date: 11/04/1997    Years since quitting: 24.1    Passive exposure: Never   Smokeless tobacco: Never   Tobacco comments:    Quit in 1999  Vaping Use   Vaping Use: Never used  Substance and Sexual Activity   Alcohol use: No    Alcohol/week: 0.0 standard drinks of alcohol   Drug use: No   Sexual activity: Yes  Other Topics Concern   Not on file  Social History Narrative   Married with 2 children  Lives locally   No regular exercise   Social Determinants of Health   Financial Resource Strain: Not on file  Food Insecurity: Not on file  Transportation Needs: Not on file  Physical Activity: Not on file  Stress: Not on file  Social Connections: Not on file  Intimate Partner Violence: Not on file    Allergies  Allergen Reactions   Norvasc [Amlodipine Besylate] Swelling    Ankle swelling   Codeine Nausea And Vomiting   Amoxicillin Rash    Current Outpatient Medications  Medication Sig Dispense Refill   acyclovir (ZOVIRAX) 800 MG tablet Take 800 mg by mouth See admin instructions. Take 800 mg daily, increase to 800 mg 3 times daily as needed when having a fever blister flare     alfuzosin (UROXATRAL) 10 MG 24 hr tablet Take 1 tablet (10 mg total) by mouth at bedtime. 30 tablet 11   ALPRAZolam (XANAX) 0.5 MG tablet Take 0.5 mg by mouth at bedtime as needed for sleep.     Ascorbic Acid (VITAMIN C) 1000 MG tablet Take 1,000 mg by mouth daily.      aspirin EC 81 MG tablet Take 81 mg by mouth at bedtime.     bacitracin ointment Apply 1 application. topically 2 (two) times daily. 120 g 0   clobetasol (TEMOVATE) 0.05 % external solution Apply 1 application topically daily as needed (eczema).     fexofenadine (ALLEGRA) 180 MG tablet Take 180 mg by mouth daily as needed for allergies.     finasteride (PROSCAR) 5 MG tablet Take 1 tablet (5 mg total) by mouth daily. 90 tablet 3   furosemide (LASIX) 40 MG tablet Take 40 mg by mouth daily.     hydrochlorothiazide (HYDRODIURIL) 12.5 MG tablet Take 12.5 mg by mouth daily.     meclizine (ANTIVERT) 25 MG tablet Take 25 mg by mouth at bedtime.     Melatonin 3 MG TABS Take 3 mg by mouth at bedtime. For sleep     pantoprazole (PROTONIX) 40 MG tablet Take 1 tablet (40 mg total) by mouth daily. 90 tablet 3   Polyethyl Glycol-Propyl Glycol (SYSTANE OP) Place 1 drop into both eyes 2 (two) times daily.     simvastatin (ZOCOR) 20 MG tablet Take 20 mg by mouth every evening.     sulfamethoxazole-trimethoprim (BACTRIM DS) 800-160 MG tablet Take 1 tablet by mouth 2 (two) times daily.     vitamin B-12 (CYANOCOBALAMIN) 1000 MCG tablet Take 2,000 mcg by mouth daily.     pregabalin (LYRICA) 50 MG capsule Take 50 mg by mouth 3 (three) times daily.     No current facility-administered medications for this visit.    REVIEW OF SYSTEMS:  '[X]'$  denotes positive finding, '[ ]'$  denotes negative finding Cardiac  Comments:  Chest pain or chest pressure:    Shortness of breath upon exertion:    Short of breath when lying flat:    Irregular heart rhythm:        Vascular    Pain in calf, thigh, or hip brought on by ambulation: x   Pain in feet at night that wakes you up from your sleep:  x   Blood clot in your veins:    Leg swelling:  x       Pulmonary    Oxygen at home:    Productive cough:     Wheezing:         Neurologic    Sudden weakness in arms or legs:  Sudden numbness in arms or legs:     Sudden onset of  difficulty speaking or slurred speech:    Temporary loss of vision in one eye:     Problems with dizziness:         Gastrointestinal    Blood in stool:     Vomited blood:         Genitourinary    Burning when urinating:     Blood in urine:        Psychiatric    Major depression:         Hematologic    Bleeding problems:    Problems with blood clotting too easily:        Skin    Rashes or ulcers:        Constitutional    Fever or chills:      PHYSICAL EXAM: Vitals:   12/18/21 1258  BP: 138/81  Pulse: (!) 102  Resp: 16  Temp: 99 F (37.2 C)  TempSrc: Temporal  SpO2: 99%  Weight: 171 lb 12.8 oz (77.9 kg)  Height: '5\' 6"'$  (1.676 m)    GENERAL: The patient is a well-nourished male, in no acute distress. The vital signs are documented above. CARDIOVASCULAR: 2+ radial and 2+ dorsalis pedis pulses bilaterally PULMONARY: There is good air exchange  MUSCULOSKELETAL: There are no major deformities or cyanosis. NEUROLOGIC: No focal weakness or paresthesias are detected. SKIN: He does have changes of chronic venous insufficiency bilaterally with swelling and thickening.  He has superficial abrasions over the medial aspect of his left calf which appear to be healing PSYCHIATRIC: The patient has a normal affect.  DATA:  Noninvasive studies today in our office reveal normal triphasic waveforms bilaterally.  Ankle arm index is slightly abnormal at 0.92 on the right and is normal at 1.0 on the left    I reviewed old CT scan which was noncontrast looking for renal stones in 2020.  This does show extensive aortoiliac calcification.  Difficult to determine if there is flow limitation based on being a noncontrast study  MEDICAL ISSUES: No evidence of limb threatening critical ischemia with normal pedal pulses and normal to near normal noninvasive studies.  He is limited by his walking ability.  For this reason we had him walk for exercise to the point of the symptoms and rechecked his  ankle arm indices.  This showed normal response and that he had no drop in his pressure and did have slight elevation in pressure after walking.  I explained to the patient this essentially rules out aorta iliac occlusive disease for any significant component of his symptoms.  Appears his this in all likelihood is neurogenic claudication.  He was reassured with this discussion and will see [place] again on an as-needed basis   Rosetta Posner, MD [facility] [phone] Pager [phone]  Note: Portions of this report may have been transcribed using voice recognition software.  Every effort has been made to ensure accuracy; however, inadvertent computerized transcription errors may still be present.

## 2021-12-26 ENCOUNTER — Ambulatory Visit (HOSPITAL_COMMUNITY)
Admission: RE | Admit: 2021-12-26 | Discharge: 2021-12-26 | Disposition: A | Discharge status: Home / Self Care | Payer: Medicare Other | Source: Ambulatory Visit | Attending: Cardiology | Admitting: Cardiology

## 2021-12-26 DIAGNOSIS — I350 Nonrheumatic aortic (valve) stenosis: Secondary | ICD-10-CM | POA: Insufficient documentation

## 2021-12-26 LAB — ECHOCARDIOGRAM COMPLETE
AR max vel: 0.93 cm2
AV Area VTI: 0.95 cm2
AV Area mean vel: 1.03 cm2
AV Mean grad: 35 mmHg
AV Peak grad: 60.8 mmHg
Ao pk vel: 3.9 m/s
Area-P 1/2: 2.83 cm2
P 1/2 time: 540 msec
S' Lateral: 2.7 cm

## 2021-12-26 NOTE — Progress Notes (Signed)
*  PRELIMINARY RESULTS* Echocardiogram 2D Echocardiogram has been performed.  Calvin Moreno 12/26/2021, 2:47 PM

## 2022-02-10 ENCOUNTER — Ambulatory Visit: Payer: Medicare Other | Admitting: Physician Assistant

## 2022-02-12 ENCOUNTER — Encounter: Payer: Self-pay | Admitting: *Deleted

## 2022-02-12 ENCOUNTER — Ambulatory Visit (INDEPENDENT_AMBULATORY_CARE_PROVIDER_SITE_OTHER): Payer: Medicare Other | Admitting: Physician Assistant

## 2022-02-12 VITALS — BP 112/73 | HR 96 | Ht 65.0 in | Wt 180.0 lb

## 2022-02-12 DIAGNOSIS — Z87442 Personal history of urinary calculi: Secondary | ICD-10-CM | POA: Diagnosis not present

## 2022-02-12 DIAGNOSIS — N401 Enlarged prostate with lower urinary tract symptoms: Secondary | ICD-10-CM | POA: Diagnosis not present

## 2022-02-12 DIAGNOSIS — N138 Other obstructive and reflux uropathy: Secondary | ICD-10-CM | POA: Diagnosis not present

## 2022-02-12 DIAGNOSIS — N200 Calculus of kidney: Secondary | ICD-10-CM

## 2022-02-12 DIAGNOSIS — R3912 Poor urinary stream: Secondary | ICD-10-CM | POA: Diagnosis not present

## 2022-02-12 LAB — BLADDER SCAN AMB NON-IMAGING: Scan Result: 59

## 2022-02-12 MED ORDER — TAMSULOSIN HCL 0.4 MG PO CAPS: 0.4000 mg | ORAL_CAPSULE | Freq: Every day | ORAL | 0 refills | Status: DC

## 2022-02-12 NOTE — Progress Notes (Signed)
Assessment: 1. Benign prostatic hyperplasia with urinary obstruction - Urinalysis, Routine w reflex microscopic - BLADDER SCAN AMB NON-IMAGING  2. Calculus, renal  3. Weak urinary stream    Plan: Resume Flomax and continue Uroxatrol. Reminded pt about stone prevention diet and to take enough time while voiding. FU in 6 months for UA and PVR. Renal US due around May of next year.   Chief Complaint: No chief complaint on file.   HPI: Calvin Moreno is a 81 y.o. male who presents for continued evaluation of BPH and nephrolithiasis. Stream a little stronger than last visit. Nocturia continues 1-2 x at night without dysuria, gross hematuria. No flank pain. Discussed medication list again this visit. Pt remains on Uroxatrol, but is out of Flomax. Pt continues to take care of frail spouse who is chronically ill.  UA= clear PVR=43m  11/15/21 Calvin VUNCANNONis a 81y.o. male who presents for continued evaluation of BPH and LUTS. Pt started on Uroxatrol one month ago and discontinued Hytrin at his last visit.  He is tolerating the medication well and has seen some improvement in his LUTS, but continues to have weak stream and 4 voids per night.  No dysuria, gross hematuria. IPSS = 20, quality-of-life 3.  The patient had multiple questions concerning his medication list which were discussed at length.  Questions also answered concerning late effects previous radiation. No stone sxs at this time. Last [place]   Urine clear PVR 43 mL   10/11/21 Calvin HWalkowiakis a 89yohere for followup for BPH with a weak stream and nephrolithiasis IPSS 22 QOl 3. He currently on hytrin '5mg'$  daily. Nocturia 2-3x. He has intermittent straining to urinate.  Renal [place]2022 shows no calculi. No other complaints today   Portions of the above documentation were copied from a prior visit for review purposes only.  Allergies: Allergies  Allergen Reactions   Norvasc [Amlodipine Besylate] Swelling    Ankle  swelling   Codeine Nausea And Vomiting   Amoxicillin Rash    PMH: Past Medical History:  Diagnosis Date   Aortic stenosis, mod 2021   And insufficiency, evaluation by Dr. WVerl Blalockin 2000   Arthritis    Benign prostatic hypertrophy    PSA of 1.39 in 09/2010   Chronic kidney disease    Borderline; creatinine of 1.6 in 08/2009, but 1.15 in 05/2010   Chronic sinusitis    By MRI   Diverticulosis 2013   Hearing impaired    Hemorrhoids 2006   Hiatal hernia 2006   HOH (hard of hearing)    Hyperlipidemia    Elevated triglycerides   Hypertension    Mild internal carotid artery plaque on MRI/MRA in 2002   IBS (irritable bowel syndrome)    Insomnia    Left bundle branch block 20867  26195  Metabolic syndrome    Fasting hyperglycemia   Murmur    Nephrolithiasis 08/2009   2011   Obesity    Proctitis    radiation induced   [place]   s/p XRT   Seasonal allergies    [place]    TIA, no residual, seen on MRI   Tobacco abuse, in remission    60 pack years; quit in 1999   Uses hearing aid    bilateral    PSH: Past Surgical History:  Procedure Laterality Date   ANKLE FRACTURE SURGERY     left   CATARACT EXTRACTION Left  CATARACT EXTRACTION W/PHACO Right 12/19/2013   Procedure: CATARACT EXTRACTION PHACO AND INTRAOCULAR LENS PLACEMENT RIGHT EYE CDE=14.78;  Surgeon: Tonny Branch, MD;  Location: [facility];  Service: Ophthalmology;  Laterality: Right;   CHOLECYSTECTOMY     COLONOSCOPY  2006   Dr. Ward Givens mucosa throughout colon, moderate internal hemorrhoids. bx= benign colonic mucosa   COLONOSCOPY  12/03/2011   Dr. Gala Romney- colonic diverticulosis, radiation-induced proctitis- s/p APC ablation, no microscopic colitis on bx   COLONOSCOPY WITH PROPOFOL N/A 06/12/2020   Procedure: COLONOSCOPY WITH PROPOFOL;  Surgeon: Daneil Dolin, MD;  Location: [facility];  Service: Endoscopy;  Laterality: N/A;  2:45pm   ESOPHAGOGASTRODUODENOSCOPY  2006   Dr. Oletta Lamas-  small hiatal hernia and slightly reddened distal esophagus o/w normal   FLEXIBLE SIGMOIDOSCOPY N/A 07/13/2013   [id number] changes of the rectum with active oozing consistent with chronic radiation proctitis.  Otherwise, negative sigmoidoscopy to 40 cm.Status post argon plasma coagulation ablation    HOT HEMOSTASIS N/A 07/13/2013   Procedure: HOT HEMOSTASIS (ARGON PLASMA COAGULATION/BICAP);  Surgeon: Daneil Dolin, MD;  Location: [facility];  Service: Endoscopy;  Laterality: N/A;   Ileocolonoscopy  12/03/2011   NIO:EVOJJKK diverticulosis. Radiation-induced proctitis status post APC ablation. Status post segmental colon Biopsy   TONSILLECTOMY      SH: Social History   Tobacco Use   Smoking status: Former    Packs/day: 1.50    Years: 40.00    Total pack years: 60.00    Types: Cigarettes    Quit date: 11/04/1997    Years since quitting: 24.3    Passive exposure: Never   Smokeless tobacco: Never   Tobacco comments:    Quit in 1999  Vaping Use   Vaping Use: Never used  Substance Use Topics   Alcohol use: No    Alcohol/week: 0.0 standard drinks of alcohol   Drug use: No    ROS: All other review of systems were reviewed and are negative except what is noted above in HPI  PE: BP 112/73   Pulse 96   Ht '5\' 5"'$  (1.651 m)   Wt 180 lb (81.6 kg)   BMI 29.95 kg/m  GENERAL APPEARANCE:  Well appearing, well developed, well nourished, NAD HEENT:  Atraumatic, normocephalic NECK:  Supple. Trachea midline ABDOMEN:  Soft, non-tender, no masses EXTREMITIES:  Moves all extremities well, without clubbing, cyanosis, or edema NEUROLOGIC:  Alert and oriented x 3, slow gait MENTAL STATUS:  appropriate BACK:  Non-tender to palpation, No CVAT SKIN:  Warm, dry, and intact   Results: Laboratory Data: Lab Results  Component Value Date   WBC 5.2 06/17/2018   HGB 15.1 06/17/2018   HCT 45.4 06/17/2018   MCV 105.3 (H) 06/17/2018   PLT 183 06/17/2018    Lab Results  Component Value  Date   CREATININE 1.16 06/08/2020    Lab Results  Component Value Date   HGBA1C 5.9 06/06/2010    Urinalysis    Component Value Date/Time   COLORURINE YELLOW 06/17/2018 0626   APPEARANCEUR Clear 02/12/2022 1510   LABSPEC 1.023 06/17/2018 0626   PHURINE 5.0 06/17/2018 0626   GLUCOSEU Negative 02/12/2022 1510   HGBUR SMALL (A) 06/17/2018 0626   BILIRUBINUR Negative 02/12/2022 1510   KETONESUR NEGATIVE 06/17/2018 0626   PROTEINUR Negative 02/12/2022 1510   PROTEINUR NEGATIVE 06/17/2018 0626   UROBILINOGEN negative (A) 10/19/2019 1444   UROBILINOGEN 0.2 08/26/2009 0930   NITRITE Negative 02/12/2022 1510   NITRITE NEGATIVE 06/17/2018 0626   LEUKOCYTESUR Negative  02/12/2022 1510    Lab Results  Component Value Date   LABMICR Comment 02/12/2022   BACTERIA RARE (A) 06/17/2018    Pertinent Imaging: No results found for this or any previous visit.  No results found for this or any previous visit.  No results found for this or any previous visit.  No results found for this or any previous visit.  Results for orders placed during the hospital encounter of 10/04/21  Ultrasound renal complete  Narrative CLINICAL DATA:  Nephrolithiasis.  EXAM: RENAL / URINARY TRACT ULTRASOUND COMPLETE  COMPARISON:  Renal ultrasound 10/02/2020.  FINDINGS: Right Kidney:  Renal measurements: 11.2 x 4.7 x 5.5 cm = volume: 149 mL. Echogenicity within normal limits. There is no hydronephrosis. There is an exophytic cyst in the inferior pole measuring 4.6 x 4.1 x 4.3 cm, minimally decreased in size compared to the prior study.  Left Kidney:  Renal measurements: 12.6 x 5.0 x 4.8 cm = volume: 156 mL. Echogenicity within normal limits. No mass or hydronephrosis visualized.  Bladder:  Appears normal for degree of bladder distention.  Other:  None.  IMPRESSION: 1. No acute abnormality involving the kidneys. 2. Right renal cyst measuring 4.6 cm.   Electronically Signed By: Ronney Asters M.D. On: 10/07/2021 17:23  No results found for this or any previous visit.  No results found for this or any previous visit.  Results for orders placed during the hospital encounter of 06/17/18  CT RENAL STONE STUDY  Narrative CLINICAL DATA:  Right flank pain starting this morning with nausea and vomiting.  EXAM: CT ABDOMEN AND PELVIS WITHOUT CONTRAST  TECHNIQUE: Multidetector CT imaging of the abdomen and pelvis was performed following the standard protocol without IV contrast.  COMPARISON:  08/26/2009  FINDINGS: Lower chest: Mitral annular calcification. Coronary atherosclerosis. No acute finding  Hepatobiliary: No focal liver abnormality.Cholecystectomy with normal common bile duct diameter.  Pancreas: Unremarkable.  Spleen: Unremarkable.  Adrenals/Urinary Tract: Negative adrenals. Mild right hydroureteronephrosis secondary to a 3 mm stone at the iliac vessel crossing. No additional urolithiasis. Chronic and nonspecific perinephric stranding on both sides. Exophytic right lower pole renal cyst. Prominent bladder wall thickness but under distended.  Stomach/Bowel:  No obstruction. No evident bowel inflammation.  Vascular/Lymphatic: Atherosclerotic calcification of the aorta and iliacs. No mass or adenopathy.  Reproductive:Prostatic calcifications and right-sided fiducial markers.  Other: No ascites or pneumoperitoneum.  Musculoskeletal: No acute abnormalities.  Osteopenia.  IMPRESSION: Mild right hydroureteronephrosis due to a 3 mm stone at the level of the iliac crossing.   Electronically Signed By: Monte Fantasia M.D. On: 06/17/2018 07:09  No results found for this or any previous visit (from the past 24 hour(s)).

## 2022-02-14 LAB — URINALYSIS, ROUTINE W REFLEX MICROSCOPIC
Bilirubin, UA: NEGATIVE
Glucose, UA: NEGATIVE
Ketones, UA: NEGATIVE
Leukocytes,UA: NEGATIVE
Nitrite, UA: NEGATIVE
Protein,UA: NEGATIVE
RBC, UA: NEGATIVE
Specific Gravity, UA: 1.015 (ref 1.005–1.030)
Urobilinogen, Ur: 0.2 mg/dL (ref 0.2–1.0)
pH, UA: 5 (ref 5.0–7.5)

## 2022-03-11 ENCOUNTER — Ambulatory Visit: Payer: Medicare Other | Admitting: Internal Medicine

## 2022-03-17 ENCOUNTER — Telehealth: Payer: Self-pay

## 2022-03-17 ENCOUNTER — Other Ambulatory Visit: Payer: Self-pay

## 2022-03-17 MED ORDER — TAMSULOSIN HCL 0.4 MG PO CAPS: 0.4000 mg | ORAL_CAPSULE | Freq: Every day | ORAL | 6 refills | Status: DC

## 2022-03-17 NOTE — Telephone Encounter (Signed)
Patient wants Roney Marion to know he wants to continue with  tamsulosin (FLOMAX) 0.4 MG CAPS capsule  Needing Rx to be called into  [place], [place] Phone:  [phone]  Fax:  [phone]     Needs 90 day supply with 3 refills for the Rx to be mailed.  Patient account number:  [id number] [employer] ID:  [id number]  Thanks, Helene Kelp

## 2022-03-17 NOTE — Telephone Encounter (Signed)
Refill sent with 6 refills until next f/u with Dr. Alyson Ingles around 03/24.

## 2022-03-25 ENCOUNTER — Ambulatory Visit (INDEPENDENT_AMBULATORY_CARE_PROVIDER_SITE_OTHER): Payer: Medicare Other | Admitting: Internal Medicine

## 2022-03-25 ENCOUNTER — Encounter: Payer: Self-pay | Admitting: Internal Medicine

## 2022-03-25 VITALS — BP 114/58 | HR 74 | Temp 98.0°F | Ht 66.0 in | Wt 172.8 lb

## 2022-03-25 DIAGNOSIS — K219 Gastro-esophageal reflux disease without esophagitis: Secondary | ICD-10-CM

## 2022-03-25 DIAGNOSIS — K627 Radiation proctitis: Secondary | ICD-10-CM | POA: Diagnosis not present

## 2022-03-25 MED ORDER — PANTOPRAZOLE SODIUM 40 MG PO TBEC: 40.0000 mg | DELAYED_RELEASE_TABLET | Freq: Every day | ORAL | 3 refills | Status: DC

## 2022-03-25 NOTE — Patient Instructions (Signed)
It was good to see you again today!  As discussed, as long as you have no future rectal bleeding, another colonoscopy is not recommended  GERD information provided  Continue Protonix 40 mg daily take 30 minutes before breakfast (dispense 90 with 3 refills)  Office visit here in 1 year.

## 2022-03-25 NOTE — Progress Notes (Signed)
Primary Care Physician:  Lemmie Evens, MD Primary Gastroenterologist:  Dr. Gala Romney  Pre-Procedure History & Physical: HPI:  Calvin Moreno is a 81 y.o. 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  Bowel function is normalized.  No further rectal bleeding last APC treatment January 2022; no future colonoscopy recommended.  GERD well-controlled on Protonix 40 mg daily.  No dysphagia.  Overall, doing very well from a GI standpoint. Patient tells me he sees Dr. Craig Staggers about every 3 months.  Past Medical History:  Diagnosis Date   Aortic stenosis, mod 2021   And insufficiency, evaluation by Dr. Verl Blalock in 2000   Arthritis    Benign prostatic hypertrophy    PSA of 1.39 in 09/2010   Chronic kidney disease    Borderline; creatinine of 1.6 in 08/2009, but 1.15 in 05/2010   Chronic sinusitis    By MRI   Diverticulosis 2013   Hearing impaired    Hemorrhoids 2006   Hiatal hernia 2006   HOH (hard of hearing)    Hyperlipidemia    Elevated triglycerides   Hypertension    Mild internal carotid artery plaque on MRI/MRA in 2002   IBS (irritable bowel syndrome)    Insomnia    Left bundle branch block 8119   1478   Metabolic syndrome    Fasting hyperglycemia   Murmur    Nephrolithiasis 08/2009   2011   Obesity    Proctitis    radiation induced   Prostate cancer ([place]) 11/2010   s/p XRT   Seasonal allergies    Stroke ([place])    TIA, no residual, seen on MRI   Tobacco abuse, in remission    60 pack years; quit in 1999   Uses hearing aid    bilateral    Past Surgical History:  Procedure Laterality Date   ANKLE FRACTURE SURGERY     left   CATARACT EXTRACTION Left    CATARACT EXTRACTION W/PHACO Right 12/19/2013   Procedure: CATARACT EXTRACTION PHACO AND INTRAOCULAR LENS PLACEMENT RIGHT EYE CDE=14.78;  Surgeon: Tonny Branch, MD;  Location: [facility];  Service: Ophthalmology;  Laterality: Right;   CHOLECYSTECTOMY     COLONOSCOPY  2006   Dr. Ward Givens mucosa throughout  colon, moderate internal hemorrhoids. bx= benign colonic mucosa   COLONOSCOPY  12/03/2011   Dr. Gala Romney- colonic diverticulosis, radiation-induced proctitis- s/p APC ablation, no microscopic colitis on bx   COLONOSCOPY WITH PROPOFOL N/A 06/12/2020   Procedure: COLONOSCOPY WITH PROPOFOL;  Surgeon: Daneil Dolin, MD;  Location: [facility];  Service: Endoscopy;  Laterality: N/A;  2:45pm   ESOPHAGOGASTRODUODENOSCOPY  2006   Dr. Oletta Lamas- small hiatal hernia and slightly reddened distal esophagus o/w normal   FLEXIBLE SIGMOIDOSCOPY N/A 07/13/2013   [id number] changes of the rectum with active oozing consistent with chronic radiation proctitis.  Otherwise, negative sigmoidoscopy to 40 cm.Status post argon plasma coagulation ablation    HOT HEMOSTASIS N/A 07/13/2013   Procedure: HOT HEMOSTASIS (ARGON PLASMA COAGULATION/BICAP);  Surgeon: Daneil Dolin, MD;  [facility];  Service: Endoscopy;  Laterality: N/A;   Ileocolonoscopy  12/03/2011   ONG:EXBMWUX diverticulosis. Radiation-induced proctitis status post APC ablation. Status post segmental colon Biopsy   TONSILLECTOMY      Prior to Admission medications   Medication Sig Start Date End Date Taking? Authorizing Provider  acyclovir (ZOVIRAX) 800 MG tablet Take 800 mg by mouth See admin instructions. Take 800 mg daily, increase to 800 mg 3 times daily as  needed when having a fever blister flare   Yes [provider]  alfuzosin (UROXATRAL) 10 MG 24 hr tablet Take 1 tablet (10 mg total) by mouth at bedtime. 10/11/21  Yes McKenzie, Candee Furbish, MD  ALPRAZolam Duanne Moron) 0.5 MG tablet Take 0.5 mg by mouth at bedtime as needed for sleep.   Yes [provider]  Ascorbic Acid (VITAMIN C) 1000 MG tablet Take 1,000 mg by mouth daily.   Yes [provider]  aspirin EC 81 MG tablet Take 81 mg by mouth at bedtime.   Yes [provider]  clobetasol (TEMOVATE) 0.05 % external solution Apply 1 application topically daily  as needed (eczema). 09/05/19  Yes [provider]  fexofenadine (ALLEGRA) 180 MG tablet Take 180 mg by mouth daily as needed for allergies.   Yes [provider]  finasteride (PROSCAR) 5 MG tablet Take 1 tablet (5 mg total) by mouth daily. 10/11/21  Yes McKenzie, Candee Furbish, MD  furosemide (LASIX) 40 MG tablet Take 40 mg by mouth daily. 12/10/21  Yes [provider]  hydrochlorothiazide (HYDRODIURIL) 12.5 MG tablet Take 12.5 mg by mouth daily. 01/03/20  Yes [provider]  meclizine (ANTIVERT) 25 MG tablet Take 25 mg by mouth at bedtime.   Yes [provider]  Melatonin 3 MG TABS Take 3 mg by mouth at bedtime. For sleep   Yes [provider]  pantoprazole (PROTONIX) 40 MG tablet Take 1 tablet (40 mg total) by mouth daily. 09/10/21  Yes Ellijah Leffel, Cristopher Estimable, MD  Polyethyl Glycol-Propyl Glycol (SYSTANE OP) Place 1 drop into both eyes 2 (two) times daily.   Yes [provider]  simvastatin (ZOCOR) 20 MG tablet Take 20 mg by mouth every evening.   Yes [provider]  tamsulosin (FLOMAX) 0.4 MG CAPS capsule Take 1 capsule (0.4 mg total) by mouth daily. 03/17/22  Yes Summerlin, Berneice Heinrich, PA-C  vitamin B-12 (CYANOCOBALAMIN) 1000 MCG tablet Take 2,000 mcg by mouth daily.   Yes [provider]    Allergies as of 03/25/2022 - Review Complete 03/25/2022  Allergen Reaction Noted   Norvasc [amlodipine besylate] Swelling 09/19/2014   Codeine Nausea And Vomiting 11/06/2011   Amoxicillin Rash 12/16/2021    Family History  Problem Relation Age of Onset   Heart failure Mother    Heart attack Father    Heart failure Brother        bladder cancer (agent orange exposure)   Cirrhosis Brother        chronic viral hepatitis   Colon cancer Neg Hx     Social History   Socioeconomic History   Marital status: Married    Spouse name: Not on file   Number of children: 2   Years of education: Not on file   Highest education  level: Not on file  Occupational History   Occupation: Retired    Fish farm manager: [employer]  Tobacco Use   Smoking status: Former    Packs/day: 1.50    Years: 40.00    Total pack years: 60.00    Types: Cigarettes    Quit date: 11/04/1997    Years since quitting: 24.4    Passive exposure: Never   Smokeless tobacco: Never   Tobacco comments:    Quit in 1999  Vaping Use   Vaping Use: Never used  Substance and Sexual Activity   Alcohol use: No    Alcohol/week: 0.0 standard drinks of alcohol   Drug use: No   Sexual  activity: Yes  Other Topics Concern   Not on file  Social History Narrative   Married with 2 children   Lives locally   No regular exercise   Social Determinants of Health   Financial Resource Strain: Not on file  Food Insecurity: Not on file  Transportation Needs: Not on file  Physical Activity: Not on file  Stress: Not on file  Social Connections: Not on file  Intimate Partner Violence: Not on file    Review of Systems: See HPI, otherwise negative ROS  Physical Exam: BP (!) 114/58 (BP Location: Right Arm, Patient Position: Sitting, Cuff Size: Normal)   Pulse 74   Temp 98 F (36.7 C) (Oral)   Ht '5\' 6"'$  (1.676 m)   Wt 172 lb 12.8 oz (78.4 kg)   SpO2 96%   BMI 27.89 kg/m  General:   Alert,   pleasant and cooperative in NAD Neck:  Supple; no masses or thyromegaly. No significant cervical adenopathy. Abdomen: Non-distended, normal bowel sounds.  Soft and nontender without appreciable mass or hepatosplenomegaly.  Pulses:  Normal pulses noted. Extremities:  Without clubbing or edema.  Impression/Plan: Pleasant 81 year old gent with a history of radiation-induced proctitis  -  status post APC multiple sessions. Proctitis symptoms have resolved.  GERD well-controlled on Protonix 40 mg daily without any alarm symptoms.  Recommendations:  As discussed, as long as you have no future rectal bleeding, another colonoscopy is not recommended  GERD  information provided  Continue Protonix 40 mg daily take 30 minutes before breakfast (dispense 90 with 3 refills)  Office visit here in 1 year.  Notice: This dictation was prepared with Dragon dictation along with smaller phrase technology. Any transcriptional errors that result from this process are unintentional and may not be corrected upon review.

## 2022-03-28 NOTE — Therapy (Unsigned)
OUTPATIENT PHYSICAL THERAPY wound  EVALUATION   Patient Name: Calvin Moreno MRN: [id number] DOB:01/15/1941, 81 y.o., male Today's Date: 03/31/2022   PCP: Newt Minion REFERRING PROVIDER: Newt Minion   PT End of Session - 03/31/22 1639     Visit Number 1    Number of Visits 8    Date for PT Re-Evaluation 04/30/22    Authorization Type medicare    Progress Note Due on Visit 8    PT Start Time 3614    PT Stop Time 1600    PT Time Calculation (min) 35 min    Activity Tolerance Patient tolerated treatment well    Behavior During Therapy [facility] for tasks assessed/performed             Past Medical History:  Diagnosis Date   Aortic stenosis, mod 2021   And insufficiency, evaluation by Dr. Verl Blalock in 2000   Arthritis    Benign prostatic hypertrophy    PSA of 1.39 in 09/2010   Chronic kidney disease    Borderline; creatinine of 1.6 in 08/2009, but 1.15 in 05/2010   Chronic sinusitis    By MRI   Diverticulosis 2013   Hearing impaired    Hemorrhoids 2006   Hiatal hernia 2006   HOH (hard of hearing)    Hyperlipidemia    Elevated triglycerides   Hypertension    Mild internal carotid artery plaque on MRI/MRA in 2002   IBS (irritable bowel syndrome)    Insomnia    Left bundle branch block 4315   4008   Metabolic syndrome    Fasting hyperglycemia   Murmur    Nephrolithiasis 08/2009   2011   Obesity    Proctitis    radiation induced   Prostate cancer ([place]) 11/2010   s/p XRT   Seasonal allergies    Stroke ([place])    TIA, no residual, seen on MRI   Tobacco abuse, in remission    60 pack years; quit in 1999   Uses hearing aid    bilateral   Past Surgical History:  Procedure Laterality Date   ANKLE FRACTURE SURGERY     left   CATARACT EXTRACTION Left    CATARACT EXTRACTION W/PHACO Right 12/19/2013   Procedure: CATARACT EXTRACTION PHACO AND INTRAOCULAR LENS PLACEMENT RIGHT EYE CDE=14.78;  Surgeon: Tonny Branch, MD;  Location: [facility];  Service: Ophthalmology;   Laterality: Right;   CHOLECYSTECTOMY     COLONOSCOPY  2006   Dr. Ward Givens mucosa throughout colon, moderate internal hemorrhoids. bx= benign colonic mucosa   COLONOSCOPY  12/03/2011   Dr. Gala Romney- colonic diverticulosis, radiation-induced proctitis- s/p APC ablation, no microscopic colitis on bx   COLONOSCOPY WITH PROPOFOL N/A 06/12/2020   Procedure: COLONOSCOPY WITH PROPOFOL;  Surgeon: Daneil Dolin, MD;  [facility];  Service: Endoscopy;  Laterality: N/A;  2:45pm   ESOPHAGOGASTRODUODENOSCOPY  2006   Dr. Oletta Lamas- small hiatal hernia and slightly reddened distal esophagus o/w normal   FLEXIBLE SIGMOIDOSCOPY N/A 07/13/2013   [id number] changes of the rectum with active oozing consistent with chronic radiation proctitis.  Otherwise, negative sigmoidoscopy to 40 cm.Status post argon plasma coagulation ablation    HOT HEMOSTASIS N/A 07/13/2013   Procedure: HOT HEMOSTASIS (ARGON PLASMA COAGULATION/BICAP);  Surgeon: Daneil Dolin, MD;  [facility];  Service: Endoscopy;  Laterality: N/A;   Ileocolonoscopy  12/03/2011   [id number] diverticulosis. Radiation-induced proctitis status post APC ablation. 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  Pt states he has been on three antibiotics in the past month none of which decreased his wound.  Pt states it was very swollen but the swelling has came down significantly  Patient and Family Stated Goals wound to heal  Date of Onset 05/28/21  Pain Assessment  Pain Scale 0-10  Pain Score 0  Evaluation and Treatment  Evaluation and Treatment Procedures Explained to Patient/Family Yes  Evaluation and Treatment Procedures agreed to  Wound / Incision (Open or Dehisced) 03/31/22  Left;Lateral  Date First Assessed/Time First Assessed: 03/31/22 1530   Wound Type: (c)   Location Orientation: Left;Lateral  Present on Admission: Yes  Wound Image   Dressing Type None  Dressing Changed New  Dressing Change Frequency PRN  % Wound base Red or Granulating 70% (not granulating)  % Wound base Other/Granulation Tissue (Comment) 30% (scabbing)  Peri-wound Assessment Edema  Wound Length (cm) 8.5 cm  Wound Width (cm) 8 cm  Wound Surface Area (cm^2) 68 cm^2  Closure None  Drainage Amount Scant  Treatment Cleansed  Wound Therapy - Assess/Plan/Recommendations  Wound Therapy - Clinical Statement see below  Wound Therapy - Frequency 2X / week  Wound Therapy - Current Recommendations PT  Wound Plan cleanse, debride and dress as needed  Wound Therapy  Dressing  medihoney to wound f/b 4x4 and profore lite compression.           PATIENT EDUCATION: Education details: keep dressing dry, keep dressing on until next visit unless it becomes wet or painful Person educated: Patient Education method: Explanation Education comprehension: returned demonstration   HOME EXERCISE PROGRAM: Ankle pumps   GOALS: Goals reviewed with patient? No  SHORT TERM GOALS: Target date: 04/14/2022  PT wound to be free of scabs  Baseline: Goal status: INITIAL    LONG TERM GOALS: Target date: 04/28/2022  Wound to be healed and no redness along lateral aspect of Lt LE  Baseline:  Goal  status: INITIAL  ASSESSMENT:  CLINICAL IMPRESSION: Patient is a 81  y.o. male  who was seen today for physical therapy evaluation and treatment for a non healing wound which he has had for 10 months.  The pt has had three antibiotics without improvement therefore he was referred to therapy for wound care.  Mr. Stalvey will benefit from skilled PT in order to decrease the possibility of cellulitis.  .    OBJECTIVE IMPAIRMENTS: increased edema, pain, and decreased skin integrity. .   ACTIVITY LIMITATIONS: bathing and dressing    PERSONAL FACTORS: Time since onset of injury/illness/exacerbation are also affecting patient's functional outcome.   REHAB POTENTIAL: Fair   CLINICAL DECISION MAKING: Stable/uncomplicated  EVALUATION COMPLEXITY: Low  PLAN: PT  FREQUENCY: 1-2x/week  PT DURATION: 4 weeks  PLANNED INTERVENTIONS: Therapeutic exercises, Patient/Family education, Self Care, and wound care  PLAN FOR NEXT SESSION: assess how pt tolerated dressing, debride and dressing as needed.    Rayetta Humphrey, [place] CLT [phone]

## 2022-03-31 ENCOUNTER — Ambulatory Visit (HOSPITAL_COMMUNITY): Payer: Medicare Other | Attending: Family Medicine | Admitting: Physical Therapy

## 2022-03-31 ENCOUNTER — Encounter (HOSPITAL_COMMUNITY): Payer: Self-pay | Admitting: Physical Therapy

## 2022-03-31 DIAGNOSIS — M79662 Pain in left lower leg: Secondary | ICD-10-CM | POA: Insufficient documentation

## 2022-03-31 DIAGNOSIS — S81802S Unspecified open wound, left lower leg, sequela: Secondary | ICD-10-CM | POA: Diagnosis not present

## 2022-04-02 ENCOUNTER — Ambulatory Visit (HOSPITAL_COMMUNITY): Payer: Medicare Other

## 2022-04-02 ENCOUNTER — Encounter (HOSPITAL_COMMUNITY): Payer: Self-pay

## 2022-04-02 DIAGNOSIS — S81802S Unspecified open wound, left lower leg, sequela: Secondary | ICD-10-CM

## 2022-04-02 DIAGNOSIS — M79662 Pain in left lower leg: Secondary | ICD-10-CM

## 2022-04-02 NOTE — Therapy (Signed)
[name], [place], [address] Phone: [phone]   Fax:  [phone]  Wound Care Therapy  Patient Details  Name: Calvin Moreno MRN: [id number] Date of Birth: November 26, 1940 Referring Provider (PT): Newt Minion   Encounter Date: 04/02/2022   PT End of Session - 04/02/22 1508     Visit Number 2    Number of Visits 8    Date for PT Re-Evaluation 04/30/22    Authorization Type medicare    Progress Note Due on Visit 8    PT Start Time 1438    PT Stop Time 1500    PT Time Calculation (min) 22 min    Activity Tolerance Patient tolerated treatment well    Behavior During Therapy [facility] for tasks assessed/performed             Past Medical History:  Diagnosis Date   Aortic stenosis, mod 2021   And insufficiency, evaluation by Dr. Verl Blalock in 2000   Arthritis    Benign prostatic hypertrophy    PSA of 1.39 in 09/2010   Chronic kidney disease    Borderline; creatinine of 1.6 in 08/2009, but 1.15 in 05/2010   Chronic sinusitis    By MRI   Diverticulosis 2013   Hearing impaired    Hemorrhoids 2006   Hiatal hernia 2006   HOH (hard of hearing)    Hyperlipidemia    Elevated triglycerides   Hypertension    Mild internal carotid artery plaque on MRI/MRA in 2002   IBS (irritable bowel syndrome)    Insomnia    Left bundle branch block 1696   7893   Metabolic syndrome    Fasting hyperglycemia   Murmur    Nephrolithiasis 08/2009   2011   Obesity    Proctitis    radiation induced   Prostate cancer ([place]) 11/2010   s/p XRT   Seasonal allergies    Stroke ([place])    TIA, no residual, seen on MRI   Tobacco abuse, in remission    60 pack years; quit in 1999   Uses hearing aid    bilateral    Past Surgical History:  Procedure Laterality Date   ANKLE FRACTURE SURGERY     left   CATARACT EXTRACTION Left    CATARACT EXTRACTION W/PHACO Right 12/19/2013   Procedure: CATARACT EXTRACTION PHACO AND INTRAOCULAR LENS  PLACEMENT RIGHT EYE CDE=14.78;  Surgeon: Tonny Branch, MD;  Location: [facility];  Service: Ophthalmology;  Laterality: Right;   CHOLECYSTECTOMY     COLONOSCOPY  2006   Dr. Ward Givens mucosa throughout colon, moderate internal hemorrhoids. bx= benign colonic mucosa   COLONOSCOPY  12/03/2011   Dr. Gala Romney- colonic diverticulosis, radiation-induced proctitis- s/p APC ablation, no microscopic colitis on bx   COLONOSCOPY WITH PROPOFOL N/A 06/12/2020   Procedure: COLONOSCOPY WITH PROPOFOL;  Surgeon: Daneil Dolin, MD;  [facility];  Service: Endoscopy;  Laterality: N/A;  2:45pm   ESOPHAGOGASTRODUODENOSCOPY  2006   Dr. Oletta Lamas- small hiatal hernia and slightly reddened distal esophagus o/w normal   FLEXIBLE SIGMOIDOSCOPY N/A 07/13/2013   [id number] changes of the rectum with active oozing consistent with chronic radiation proctitis.  Otherwise, negative sigmoidoscopy to 40 cm.Status post argon plasma coagulation ablation    HOT HEMOSTASIS N/A 07/13/2013   Procedure: HOT HEMOSTASIS (ARGON PLASMA COAGULATION/BICAP);  Surgeon: Daneil Dolin, MD;  [facility];  Service: Endoscopy;  Laterality: N/A;   Ileocolonoscopy  12/03/2011   POE:UMPNTIR diverticulosis.  Radiation-induced proctitis status post APC ablation. Status post segmental colon Biopsy   TONSILLECTOMY      There were no vitals filed for this visit.               Wound Therapy - 04/02/22 0001     Subjective Pt stated he had to remove the outer layer of dressings, stated LE stated to swell.  No reports of pain.    Patient and Family Stated Goals wound to heal    Date of Onset 05/28/21    Pain Scale 0-10    Pain Score 0-No pain    Evaluation and Treatment Procedures Explained to Patient/Family Yes    Evaluation and Treatment Procedures agreed to    Wound Properties Date First Assessed: 03/31/22 Time First Assessed: 1530 Wound Type: -- , unknnown  Location Orientation: Left;Lateral Present on Admission:  Yes   Wound Image Images linked: 1    Dressing Type Impregnated gauze (bismuth);Gauze (Comment)   xeroform, vaseline perimeter, kerlix with netting   Dressing Changed Changed    Dressing Change Frequency PRN    % Wound base Red or Granulating 80%   not granulated   % Wound base Other/Granulation Tissue (Comment) 20%    Peri-wound Assessment Erythema (non-blanchable)    Drainage Amount None    Treatment Cleansed    Wound Therapy - Clinical Statement Pt arrived with outer layer removed due to discomfort. Cleansed LE well with no scabs noted.  Changed to xeroform and kerlix due to unable to tolerate compression last session, review edema next session and change as needed.    Wound Therapy - Frequency 2X / week    Wound Therapy - Current Recommendations PT    Wound Plan cleanse, debride and dress as needed    Dressing  xeroform, vaseline, kerlix with netting                                  Patient will benefit from skilled therapeutic intervention in order to improve the following deficits and impairments:     Visit Diagnosis: Leg wound, left, sequela  Pain in left lower leg     Problem List Patient Active Problem List   Diagnosis Date Noted   Benign prostatic hyperplasia with urinary obstruction 10/09/2020   Weak urinary stream 10/09/2020   Urinary frequency 10/09/2020   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 [place], [place], [address] Phone: [phone]   Fax:  [phone]  Name: Calvin Moreno MRN: [id number] Date of Birth:  02/07/1941

## 2022-04-08 ENCOUNTER — Ambulatory Visit (HOSPITAL_COMMUNITY): Payer: Medicare Other | Admitting: Physical Therapy

## 2022-04-08 DIAGNOSIS — S81802S Unspecified open wound, left lower leg, sequela: Secondary | ICD-10-CM

## 2022-04-08 DIAGNOSIS — M79662 Pain in left lower leg: Secondary | ICD-10-CM

## 2022-04-08 NOTE — Therapy (Signed)
[place] More[place], [place], [address] Phone: [phone]   Fax:  [phone]  Wound Care Therapy  Patient Details  Name: Calvin Moreno MRN: [id number] Date of Birth: 1941/02/17 Referring Provider (PT): Newt Minion   Encounter Date: 04/08/2022   PT End of Session - 04/08/22 1501     Visit Number 3    Number of Visits 8    Date for PT Re-Evaluation 04/30/22    Authorization Type medicare    Progress Note Due on Visit 8    PT Start Time 1435    PT Stop Time 2683    PT Time Calculation (min) 22 min    Activity Tolerance Patient tolerated treatment well    Behavior During Therapy [facility] for tasks assessed/performed             Past Medical History:  Diagnosis Date   Aortic stenosis, mod 2021   And insufficiency, evaluation by Dr. Verl Blalock in 2000   Arthritis    Benign prostatic hypertrophy    PSA of 1.39 in 09/2010   Chronic kidney disease    Borderline; creatinine of 1.6 in 08/2009, but 1.15 in 05/2010   Chronic sinusitis    By MRI   Diverticulosis 2013   Hearing impaired    Hemorrhoids 2006   Hiatal hernia 2006   HOH (hard of hearing)    Hyperlipidemia    Elevated triglycerides   Hypertension    Mild internal carotid artery plaque on MRI/MRA in 2002   IBS (irritable bowel syndrome)    Insomnia    Left bundle branch block 4196   2229   Metabolic syndrome    Fasting hyperglycemia   Murmur    Nephrolithiasis 08/2009   2011   Obesity    Proctitis    radiation induced   Prostate cancer ([place]) 11/2010   s/p XRT   Seasonal allergies    Stroke ([place])    TIA, no residual, seen on MRI   Tobacco abuse, in remission    60 pack years; quit in 1999   Uses hearing aid    bilateral    Past Surgical History:  Procedure Laterality Date   ANKLE FRACTURE SURGERY     left   CATARACT EXTRACTION Left    CATARACT EXTRACTION W/PHACO Right 12/19/2013   Procedure: CATARACT EXTRACTION PHACO AND INTRAOCULAR LENS  PLACEMENT RIGHT EYE CDE=14.78;  Surgeon: Tonny Branch, MD;  Location: [facility];  Service: Ophthalmology;  Laterality: Right;   CHOLECYSTECTOMY     COLONOSCOPY  2006   Dr. Ward Givens mucosa throughout colon, moderate internal hemorrhoids. bx= benign colonic mucosa   COLONOSCOPY  12/03/2011   Dr. Gala Romney- colonic diverticulosis, radiation-induced proctitis- s/p APC ablation, no microscopic colitis on bx   COLONOSCOPY WITH PROPOFOL N/A 06/12/2020   Procedure: COLONOSCOPY WITH PROPOFOL;  Surgeon: Daneil Dolin, MD;  Location: [facility];  Service: Endoscopy;  Laterality: N/A;  2:45pm   ESOPHAGOGASTRODUODENOSCOPY  2006   Dr. Oletta Lamas- small hiatal hernia and slightly reddened distal esophagus o/w normal   FLEXIBLE SIGMOIDOSCOPY N/A 07/13/2013   NLG:[id number] changes of the rectum with active oozing consistent with chronic radiation proctitis.  Otherwise, negative sigmoidoscopy to 40 cm.Status post argon plasma coagulation ablation    HOT HEMOSTASIS N/A 07/13/2013   Procedure: HOT HEMOSTASIS (ARGON PLASMA COAGULATION/BICAP);  Surgeon: Daneil Dolin, MD;  Location: [facility];  Service: Endoscopy;  Laterality: N/A;   Ileocolonoscopy  12/03/2011   XKG:YJEHUDJ diverticulosis.  Radiation-induced proctitis status post APC ablation. Status post segmental colon Biopsy   TONSILLECTOMY      There were no vitals filed for this visit.               Wound Therapy - 04/08/22 0001     Subjective ; his wound is itching.    Patient and Family Stated Goals wound to heal    Date of Onset 05/28/21    Pain Scale 0-10    Pain Score 0-No pain   just itchy   Evaluation and Treatment Procedures Explained to Patient/Family Yes    Evaluation and Treatment Procedures agreed to    Wound Properties Date First Assessed: 03/31/22 Time First Assessed: 1530 Wound Type: -- , unknnown  Location Orientation: Left;Lateral Present on Admission: Yes   Wound Image Images linked: 1    Dressing Type Gauze  (Comment);Impregnated gauze (bismuth)    Dressing Changed Changed    Dressing Change Frequency PRN    % Wound base Red or Granulating 90%    % Wound base Other/Granulation Tissue (Comment) 10%    Peri-wound Assessment Erythema (non-blanchable);Other (Comment)   slight increased swelling.   Treatment Cleansed    Wound Therapy - Clinical Statement PT arrived with dressing sliding down LE>  Therapist added coban following kerlix to attempt to decrease swelling and keep bandage up.  Pt was educated to take coban off if LE begins to have increased pain.  PT verbalized understanding.    Wound Therapy - Frequency 2X / week    Wound Therapy - Current Recommendations PT    Wound Plan cleanse and assess wound for proper dressing.  May deccrease to one time a weekl.    Dressing  vasiline around wound, medihoney followed by xeroform to keep moist over wound f/b kerlix , coban and netting.                     PATIENT EDUCATION: Education details: keep dressing dry, keep dressing on until next visit unless it becomes wet or painful Person educated: Patient Education method: Explanation Education comprehension: returned demonstration     HOME EXERCISE PROGRAM: Ankle pumps     GOALS: Goals reviewed with patient? No   SHORT TERM GOALS: Target date: 04/14/2022   PT wound to be free of scabs  Baseline: Goal status: in-progress       LONG TERM GOALS: Target date: 04/28/2022   Wound to be healed and no redness along lateral aspect of Lt LE  Baseline:  Goal status: in progress    ASSESSMENT:   CLINICAL IMPRESSION: See above  OBJECTIVE IMPAIRMENTS: increased edema, pain, and decreased skin integrity. .    ACTIVITY LIMITATIONS: bathing and dressing       PERSONAL FACTORS: Time since onset of injury/illness/exacerbation are also affecting patient's functional outcome.    REHAB POTENTIAL: Fair    CLINICAL DECISION MAKING: Stable/uncomplicated   EVALUATION COMPLEXITY: Low    PLAN: PT FREQUENCY: 1-2x/week   PT DURATION: 4 weeks   PLANNED INTERVENTIONS: Therapeutic exercises, Patient/Family education, Self Care, and wound care   PLAN FOR NEXT SESSION: assess how pt tolerated dressing, debride and dressing as needed.                      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

## 2022-04-10 ENCOUNTER — Ambulatory Visit (HOSPITAL_COMMUNITY): Payer: Medicare Other | Admitting: Physical Therapy

## 2022-04-10 DIAGNOSIS — S81802S Unspecified open wound, left lower leg, sequela: Secondary | ICD-10-CM

## 2022-04-10 DIAGNOSIS — M79662 Pain in left lower leg: Secondary | ICD-10-CM

## 2022-04-10 NOTE — Therapy (Signed)
[place], [place], [address] Phone: [phone]   Fax:  [phone]  Wound Care Therapy  Patient Details  Name: Calvin Moreno MRN: [id number] Date of Birth: 27-Oct-1940 Referring Provider (PT): Newt Minion   Encounter Date: 04/10/2022  PHYSICAL THERAPY DISCHARGE SUMMARY  Visits from Start of Care: 4  Current functional level related to goals / functional outcomes: See below   Remaining deficits: See below   Education / Equipment: See below   Patient agrees to discharge. Patient goals were met. Patient is being discharged due to meeting the stated rehab goals.    PT End of Session - 04/10/22 1426     Visit Number 4    Number of Visits 8    Date for PT Re-Evaluation 04/30/22    Authorization Type medicare    Progress Note Due on Visit 8    PT Start Time 1427    PT Stop Time 1448    PT Time Calculation (min) 21 min    Activity Tolerance Patient tolerated treatment well    Behavior During Therapy [facility] for tasks assessed/performed             Past Medical History:  Diagnosis Date   Aortic stenosis, mod 2021   And insufficiency, evaluation by Dr. Verl Blalock in 2000   Arthritis    Benign prostatic hypertrophy    PSA of 1.39 in 09/2010   Chronic kidney disease    Borderline; creatinine of 1.6 in 08/2009, but 1.15 in 05/2010   Chronic sinusitis    By MRI   Diverticulosis 2013   Hearing impaired    Hemorrhoids 2006   Hiatal hernia 2006   HOH (hard of hearing)    Hyperlipidemia    Elevated triglycerides   Hypertension    Mild internal carotid artery plaque on MRI/MRA in 2002   IBS (irritable bowel syndrome)    Insomnia    Left bundle branch block 5397   6734   Metabolic syndrome    Fasting hyperglycemia   Murmur    Nephrolithiasis 08/2009   2011   Obesity    Proctitis    radiation induced   Prostate cancer ([place]) 11/2010   s/p XRT   Seasonal allergies    Stroke ([place])    TIA, no  residual, seen on MRI   Tobacco abuse, in remission    60 pack years; quit in 1999   Uses hearing aid    bilateral    Past Surgical History:  Procedure Laterality Date   ANKLE FRACTURE SURGERY     left   CATARACT EXTRACTION Left    CATARACT EXTRACTION W/PHACO Right 12/19/2013   Procedure: CATARACT EXTRACTION PHACO AND INTRAOCULAR LENS PLACEMENT RIGHT EYE CDE=14.78;  Surgeon: Tonny Branch, MD;  Location: [facility];  Service: Ophthalmology;  Laterality: Right;   CHOLECYSTECTOMY     COLONOSCOPY  2006   Dr. Ward Givens mucosa throughout colon, moderate internal hemorrhoids. bx= benign colonic mucosa   COLONOSCOPY  12/03/2011   Dr. Gala Romney- colonic diverticulosis, radiation-induced proctitis- s/p APC ablation, no microscopic colitis on bx   COLONOSCOPY WITH PROPOFOL N/A 06/12/2020   Procedure: COLONOSCOPY WITH PROPOFOL;  Surgeon: Daneil Dolin, MD;  Location[facility];  Service: Endoscopy;  Laterality: N/A;  2:45pm   ESOPHAGOGASTRODUODENOSCOPY  2006   Dr. Oletta Lamas- small hiatal hernia and slightly reddened distal esophagus o/w normal   FLEXIBLE SIGMOIDOSCOPY N/A 07/13/2013   LPF:[id number] changes of the rectum with active  oozing consistent with chronic radiation proctitis.  Otherwise, negative sigmoidoscopy to 40 cm.Status post argon plasma coagulation ablation    HOT HEMOSTASIS N/A 07/13/2013   Procedure: HOT HEMOSTASIS (ARGON PLASMA COAGULATION/BICAP);  Surgeon: Daneil Dolin, MD;  [facility];  Service: Endoscopy;  Laterality: N/A;   Ileocolonoscopy  12/03/2011   YPP:[id number] diverticulosis. Radiation-induced proctitis status post APC ablation. Status post segmental colon Biopsy   TONSILLECTOMY      There were no vitals filed for this visit.      [facility] PT Assessment - 04/10/22 0001       Assessment   Medical Diagnosis Left LEg wound    Referring Provider (PT) Newt Minion                     Wound Therapy - 04/10/22 0001     Subjective leg is  itchy, dressing stayed on well and it didnt hurt    Patient and Family Stated Goals wound to heal    Date of Onset 05/28/21    Pain Score 0-No pain    Evaluation and Treatment Procedures Explained to Patient/Family Yes    Evaluation and Treatment Procedures agreed to    Wound Properties Date First Assessed: 03/31/22 Time First Assessed: 1245 Wound Type: -- , unknnown  Location Orientation: Left;Lateral Present on Admission: Yes   Wound Image Images linked: 1    Dressing Type Gauze (Comment);Impregnated gauze (bismuth)    Dressing Changed Changed    Dressing Change Frequency --    Peri-wound Assessment --    Treatment Cleansed    Wound Therapy - Clinical Statement Patient arrives with dressing intact. Patient with some dry skin and cleansed LLE. Patient with red irritated area in wound location but no wound present. Educated patient on using moisturizer such as aquaphor to decrease dryness and irritation. Patient put on sock and after a few minutes PT checked leg to take photo for chart and wound area already appearing drier and more irritated. Patient without wound at this time and is ready for d/c. Patient eduated on f/u with MD if needed or if wound returns but no wound present currently. patient discharged from PT.    Wound Therapy - Frequency --    Wound Therapy - Current Recommendations --    Wound Plan cleanse and assess wound for proper dressing.  May deccrease to one time a weekl.    Dressing  lotion on LLE                     PATIENT EDUCATION: Education details: using moisturizer to reduce irritation Person educated: Patient Education method: Explanation Education comprehension: returned demonstration     HOME EXERCISE PROGRAM: Ankle pumps     GOALS: Goals reviewed with patient? No   SHORT TERM GOALS: Target date: 04/14/2022   PT wound to be free of scabs  Baseline: Goal status: MED       LONG TERM GOALS: Target date: 04/28/2022   Wound to be healed and  no redness along lateral aspect of Lt LE  Baseline:  Goal status: partially met   ASSESSMENT:   CLINICAL IMPRESSION: See above  OBJECTIVE IMPAIRMENTS: increased edema, pain, and decreased skin integrity. .    ACTIVITY LIMITATIONS: bathing and dressing       PERSONAL FACTORS: Time since onset of injury/illness/exacerbation are also affecting patient's functional outcome.    REHAB POTENTIAL: Fair    CLINICAL DECISION MAKING: Stable/uncomplicated   EVALUATION COMPLEXITY:  Low   PLAN: PT FREQUENCY: 1-2x/week   PT DURATION: 4 weeks   PLANNED INTERVENTIONS: Therapeutic exercises, Patient/Family education, Self Care, and wound care   PLAN FOR NEXT SESSION: assess how pt tolerated dressing, debride and dressing as needed.                      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

## 2022-04-14 ENCOUNTER — Ambulatory Visit (HOSPITAL_COMMUNITY): Payer: Medicare Other | Admitting: Physical Therapy

## 2022-04-16 ENCOUNTER — Ambulatory Visit (HOSPITAL_COMMUNITY): Payer: Medicare Other | Admitting: Physical Therapy

## 2022-04-22 ENCOUNTER — Ambulatory Visit (HOSPITAL_COMMUNITY): Payer: Medicare Other | Admitting: Physical Therapy

## 2022-04-24 ENCOUNTER — Ambulatory Visit (HOSPITAL_COMMUNITY): Payer: Medicare Other | Admitting: Physical Therapy

## 2022-04-29 ENCOUNTER — Ambulatory Visit (HOSPITAL_COMMUNITY): Payer: Medicare Other

## 2022-05-01 ENCOUNTER — Ambulatory Visit (HOSPITAL_COMMUNITY): Payer: Medicare Other | Admitting: Physical Therapy

## 2022-06-06 ENCOUNTER — Other Ambulatory Visit: Payer: Self-pay

## 2022-06-06 ENCOUNTER — Telehealth: Payer: Self-pay

## 2022-06-06 MED ORDER — TAMSULOSIN HCL 0.4 MG PO CAPS: 0.4000 mg | ORAL_CAPSULE | Freq: Every day | ORAL | 6 refills | Status: DC

## 2022-06-06 NOTE — Telephone Encounter (Signed)
Patient needing a refill on tamsulosin (FLOMAX) 0.4 MG CAPS capsule   Pharmacy  [facility], [place] Phone: [phone]  Fax: [phone]     Thanks, Helene Kelp

## 2022-06-06 NOTE — Telephone Encounter (Signed)
refill sent to updated pharmacy.

## 2022-08-13 ENCOUNTER — Ambulatory Visit (INDEPENDENT_AMBULATORY_CARE_PROVIDER_SITE_OTHER): Payer: Medicare Other | Admitting: Urology

## 2022-08-13 ENCOUNTER — Telehealth: Payer: Self-pay

## 2022-08-13 VITALS — BP 131/77 | HR 70

## 2022-08-13 DIAGNOSIS — N401 Enlarged prostate with lower urinary tract symptoms: Secondary | ICD-10-CM | POA: Diagnosis not present

## 2022-08-13 DIAGNOSIS — N200 Calculus of kidney: Secondary | ICD-10-CM | POA: Diagnosis not present

## 2022-08-13 DIAGNOSIS — R3912 Poor urinary stream: Secondary | ICD-10-CM | POA: Diagnosis not present

## 2022-08-13 DIAGNOSIS — N138 Other obstructive and reflux uropathy: Secondary | ICD-10-CM | POA: Diagnosis not present

## 2022-08-13 LAB — BLADDER SCAN AMB NON-IMAGING: Scan Result: 79

## 2022-08-13 MED ORDER — FINASTERIDE 5 MG PO TABS: 5.0000 mg | ORAL_TABLET | Freq: Every day | ORAL | 3 refills | Status: DC

## 2022-08-13 MED ORDER — SILODOSIN 8 MG PO CAPS: 8.0000 mg | ORAL_CAPSULE | Freq: Every day | ORAL | 3 refills | Status: DC

## 2022-08-13 NOTE — Progress Notes (Signed)
08/13/2022 3:22 PM   Calvin Moreno 11-Feb-1941 [id number]  Referring provider: Gareth Morgan, MD [address] [place],  [place] [address]  Followup nephrolithiasis and BPH   HPI: Mr Calvin Moreno is a 81yo here for followup for BPH and nephrolithiasis. He denies any stone events since last visit. IPSS 15 QOL 3 on uroxatral and flomax. He is also on finasteride 5mg  daily. Urine stream fair. Nocturia 0x. He has intermittent straining to urinate. No other compalints today  PMH: Past Medical History:  Diagnosis Date   Aortic stenosis, mod 2021   And insufficiency, evaluation by Dr. Daleen Squibb in 2000   Arthritis    Benign prostatic hypertrophy    PSA of 1.39 in 09/2010   Chronic kidney disease    Borderline; creatinine of 1.6 in 08/2009, but 1.15 in 05/2010   Chronic sinusitis    By MRI   Diverticulosis 2013   Hearing impaired    Hemorrhoids 2006   Hiatal hernia 2006   HOH (hard of hearing)    Hyperlipidemia    Elevated triglycerides   Hypertension    Mild internal carotid artery plaque on MRI/MRA in 2002   IBS (irritable bowel syndrome)    Insomnia    Left bundle branch block 2012   2012   Metabolic syndrome    Fasting hyperglycemia   Murmur    Nephrolithiasis 08/2009   2011   Obesity    Proctitis    radiation induced   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   s/p XRT   Seasonal allergies    Stroke (HCC)    TIA, no residual, seen on MRI   Tobacco abuse, in remission    60 pack years; quit in 1999   Uses hearing aid    bilateral    Surgical History: Past Surgical History:  Procedure Laterality Date   ANKLE FRACTURE SURGERY     left   CATARACT EXTRACTION Left    CATARACT EXTRACTION W/PHACO Right 12/19/2013   Procedure: CATARACT EXTRACTION PHACO AND INTRAOCULAR LENS PLACEMENT RIGHT EYE CDE=14.78;  Surgeon: Gemma Payor, MD;  Location: [facility];  Service: Ophthalmology;  Laterality: Right;   CHOLECYSTECTOMY     COLONOSCOPY  2006   Dr. Henry Russel mucosa throughout colon,  moderate internal hemorrhoids. bx= benign colonic mucosa   COLONOSCOPY  12/03/2011   Dr. Jena Gauss- colonic diverticulosis, radiation-induced proctitis- s/p APC ablation, no microscopic colitis on bx   COLONOSCOPY WITH PROPOFOL N/A 06/12/2020   Procedure: COLONOSCOPY WITH PROPOFOL;  Surgeon: Corbin Ade, MD;  Location: [facility];  Service: Endoscopy;  Laterality: N/A;  2:45pm   ESOPHAGOGASTRODUODENOSCOPY  2006   Dr. Randa Evens- small hiatal hernia and slightly reddened distal esophagus o/w normal   FLEXIBLE SIGMOIDOSCOPY N/A 07/13/2013   [id number] changes of the rectum with active oozing consistent with chronic radiation proctitis.  Otherwise, negative sigmoidoscopy to 40 cm.Status post argon plasma coagulation ablation    HOT HEMOSTASIS N/A 07/13/2013   Procedure: HOT HEMOSTASIS (ARGON PLASMA COAGULATION/BICAP);  Surgeon: Corbin Ade, MD;  Location: [facility];  Service: Endoscopy;  Laterality: N/A;   Ileocolonoscopy  12/03/2011   [id number]:[id number] diverticulosis. Radiation-induced proctitis status post APC ablation. Status post segmental colon Biopsy   TONSILLECTOMY      Home Medications:  Allergies as of 08/13/2022       Reactions   Norvasc [amlodipine Besylate] Swelling   Ankle swelling   Codeine Nausea And Vomiting   Amoxicillin Rash        Medication List  Accurate as of August 13, 2022  3:22 PM. If you have any questions, ask your nurse or doctor.          acyclovir 800 MG tablet Commonly known as: ZOVIRAX Take 800 mg by mouth See admin instructions. Take 800 mg daily, increase to 800 mg 3 times daily as needed when having a fever blister flare   alfuzosin 10 MG 24 hr tablet Commonly known as: UROXATRAL Take 1 tablet (10 mg total) by mouth at bedtime.   ALPRAZolam 0.5 MG tablet Commonly known as: XANAX Take 0.5 mg by mouth at bedtime as needed for sleep.   aspirin EC 81 MG tablet Take 81 mg by mouth at bedtime.   clobetasol 0.05 % external  solution Commonly known as: TEMOVATE Apply 1 application topically daily as needed (eczema).   cyanocobalamin 1000 MCG tablet Commonly known as: VITAMIN B12 Take 2,000 mcg by mouth daily.   fexofenadine 180 MG tablet Commonly known as: ALLEGRA Take 180 mg by mouth daily as needed for allergies.   finasteride 5 MG tablet Commonly known as: PROSCAR Take 1 tablet (5 mg total) by mouth daily.   furosemide 40 MG tablet Commonly known as: LASIX Take 40 mg by mouth daily.   hydrochlorothiazide 12.5 MG tablet Commonly known as: HYDRODIURIL Take 12.5 mg by mouth daily.   meclizine 25 MG tablet Commonly known as: ANTIVERT Take 25 mg by mouth at bedtime.   melatonin 3 MG Tabs tablet Take 3 mg by mouth at bedtime. For sleep   pantoprazole 40 MG tablet Commonly known as: PROTONIX Take 1 tablet (40 mg total) by mouth daily.   simvastatin 20 MG tablet Commonly known as: ZOCOR Take 20 mg by mouth every evening.   SYSTANE OP Place 1 drop into both eyes 2 (two) times daily.   tamsulosin 0.4 MG Caps capsule Commonly known as: FLOMAX Take 1 capsule (0.4 mg total) by mouth daily.   vitamin C 1000 MG tablet Take 1,000 mg by mouth daily.        Allergies:  Allergies  Allergen Reactions   Norvasc [Amlodipine Besylate] Swelling    Ankle swelling   Codeine Nausea And Vomiting   Amoxicillin Rash    Family History: Family History  Problem Relation Age of Onset   Heart failure Mother    Heart attack Father    Heart failure Brother        bladder cancer (agent orange exposure)   Cirrhosis Brother        chronic viral hepatitis   Colon cancer Neg Hx     Social History:  reports that he quit smoking about 24 years ago. His smoking use included cigarettes. He has a 60.00 pack-year smoking history. He has never been exposed to tobacco smoke. He has never used smokeless tobacco. He reports that he does not drink alcohol and does not use drugs.  ROS: All other review of  systems were reviewed and are negative except what is noted above in HPI  Physical Exam: BP 131/77   Pulse 70   Constitutional:  Alert and oriented, No acute distress. HEENT: [place] AT, moist mucus membranes.  Trachea midline, no masses. Cardiovascular: No clubbing, cyanosis, or edema. Respiratory: Normal respiratory effort, no increased work of breathing. GI: Abdomen is soft, nontender, nondistended, no abdominal masses GU: No CVA tenderness.  Lymph: No cervical or inguinal lymphadenopathy. Skin: No rashes, bruises or suspicious lesions. Neurologic: Grossly intact, no focal deficits, moving all 4 extremities. Psychiatric: Normal mood  and affect.  Laboratory Data: Lab Results  Component Value Date   WBC 5.2 06/17/2018   HGB 15.1 06/17/2018   HCT 45.4 06/17/2018   MCV 105.3 (H) 06/17/2018   PLT 183 06/17/2018    Lab Results  Component Value Date   CREATININE 1.16 06/08/2020    No results found for: "PSA"  No results found for: "TESTOSTERONE"  Lab Results  Component Value Date   HGBA1C 5.9 06/06/2010    Urinalysis    Component Value Date/Time   COLORURINE YELLOW 06/17/2018 0626   APPEARANCEUR Clear 02/12/2022 1510   LABSPEC 1.023 06/17/2018 0626   PHURINE 5.0 06/17/2018 0626   GLUCOSEU Negative 02/12/2022 1510   HGBUR SMALL (A) 06/17/2018 0626   BILIRUBINUR Negative 02/12/2022 1510   KETONESUR NEGATIVE 06/17/2018 0626   PROTEINUR Negative 02/12/2022 1510   PROTEINUR NEGATIVE 06/17/2018 0626   UROBILINOGEN negative (A) 10/19/2019 1444   UROBILINOGEN 0.2 08/26/2009 0930   NITRITE Negative 02/12/2022 1510   NITRITE NEGATIVE 06/17/2018 0626   LEUKOCYTESUR Negative 02/12/2022 1510    Lab Results  Component Value Date   LABMICR Comment 02/12/2022   BACTERIA RARE (A) 06/17/2018    Pertinent Imaging:  No results found for this or any previous visit.  No results found for this or any previous visit.  No results found for this or any previous visit.  No  results found for this or any previous visit.  Results for orders placed during the hospital encounter of 10/04/21  Ultrasound renal complete  Narrative CLINICAL DATA:  Nephrolithiasis.  EXAM: RENAL / URINARY TRACT ULTRASOUND COMPLETE  COMPARISON:  Renal ultrasound 10/02/2020.  FINDINGS: Right Kidney:  Renal measurements: 11.2 x 4.7 x 5.5 cm = volume: 149 mL. Echogenicity within normal limits. There is no hydronephrosis. There is an exophytic cyst in the inferior pole measuring 4.6 x 4.1 x 4.3 cm, minimally decreased in size compared to the prior study.  Left Kidney:  Renal measurements: 12.6 x 5.0 x 4.8 cm = volume: 156 mL. Echogenicity within normal limits. No mass or hydronephrosis visualized.  Bladder:  Appears normal for degree of bladder distention.  Other:  None.  IMPRESSION: 1. No acute abnormality involving the kidneys. 2. Right renal cyst measuring 4.6 cm.   Electronically Signed By: Darliss Cheney M.D. On: 10/07/2021 17:23  No valid procedures specified. No results found for this or any previous visit.  Results for orders placed during the hospital encounter of 06/17/18  CT RENAL STONE STUDY  Narrative CLINICAL DATA:  Right flank pain starting this morning with nausea and vomiting.  EXAM: CT ABDOMEN AND PELVIS WITHOUT CONTRAST  TECHNIQUE: Multidetector CT imaging of the abdomen and pelvis was performed following the standard protocol without IV contrast.  COMPARISON:  08/26/2009  FINDINGS: Lower chest: Mitral annular calcification. Coronary atherosclerosis. No acute finding  Hepatobiliary: No focal liver abnormality.Cholecystectomy with normal common bile duct diameter.  Pancreas: Unremarkable.  Spleen: Unremarkable.  Adrenals/Urinary Tract: Negative adrenals. Mild right hydroureteronephrosis secondary to a 3 mm stone at the iliac vessel crossing. No additional urolithiasis. Chronic and nonspecific perinephric stranding on both  sides. Exophytic right lower pole renal cyst. Prominent bladder wall thickness but under distended.  Stomach/Bowel:  No obstruction. No evident bowel inflammation.  Vascular/Lymphatic: Atherosclerotic calcification of the aorta and iliacs. No mass or adenopathy.  Reproductive:Prostatic calcifications and right-sided fiducial markers.  Other: No ascites or pneumoperitoneum.  Musculoskeletal: No acute abnormalities.  Osteopenia.  IMPRESSION: Mild right hydroureteronephrosis due to a 3 mm stone  at the level of the iliac crossing.   Electronically Signed By: Marnee Spring M.D. On: 06/17/2018 07:09   Assessment & Plan:    1. Benign prostatic hyperplasia with urinary obstruction -we will trial rapaflo 8mg  daily and continue finasteride 5mg  daily - Urinalysis, Routine w reflex microscopic - BLADDER SCAN AMB NON-IMAGING  2. Calculus, renal -followup 6 months with renal US  3. Weak urinary stream -start rapaflo 8mg  daily and stop uroxatral and flomax   No follow-ups on file.  Wilkie Aye, MD  [facility] [place]

## 2022-08-13 NOTE — Progress Notes (Unsigned)
post void residual= 79 

## 2022-08-13 NOTE — Telephone Encounter (Signed)
Sent to wrong pharmacy.  silodosin (RAPAFLO) 8 MG CAPS capsule  Request for Rx to be filled at  [place], [place] Phone: [phone]  Fax: [phone]

## 2022-08-14 ENCOUNTER — Encounter: Payer: Self-pay | Admitting: Urology

## 2022-08-14 ENCOUNTER — Other Ambulatory Visit: Payer: Self-pay

## 2022-08-14 LAB — URINALYSIS, ROUTINE W REFLEX MICROSCOPIC
Bilirubin, UA: NEGATIVE
Glucose, UA: NEGATIVE
Ketones, UA: NEGATIVE
Leukocytes,UA: NEGATIVE
Nitrite, UA: NEGATIVE
Protein,UA: NEGATIVE
RBC, UA: NEGATIVE
Specific Gravity, UA: 1.015 (ref 1.005–1.030)
Urobilinogen, Ur: 0.2 mg/dL (ref 0.2–1.0)
pH, UA: 5.5 (ref 5.0–7.5)

## 2022-08-14 MED ORDER — SILODOSIN 8 MG PO CAPS: 8.0000 mg | ORAL_CAPSULE | Freq: Every day | ORAL | 3 refills | Status: DC

## 2022-08-14 NOTE — Patient Instructions (Signed)

## 2022-08-14 NOTE — Telephone Encounter (Signed)
Rx sent to [facility] per pt request.

## 2022-08-25 ENCOUNTER — Telehealth: Payer: Self-pay | Admitting: *Deleted

## 2022-08-25 NOTE — Telephone Encounter (Signed)
Pt's son says that a referral was sent over from PCP for pt to have an EGD. Advised him that I spoke with referral coordinator and she hadn't received a referral but we could go ahead and set up an appointment to bring pt in. Transferred call to front desk.

## 2022-10-07 ENCOUNTER — Encounter: Payer: Self-pay | Admitting: Internal Medicine

## 2022-10-07 ENCOUNTER — Ambulatory Visit: Payer: Medicare Other | Admitting: Internal Medicine

## 2022-11-06 ENCOUNTER — Ambulatory Visit (INDEPENDENT_AMBULATORY_CARE_PROVIDER_SITE_OTHER): Payer: Medicare Other

## 2022-11-06 ENCOUNTER — Ambulatory Visit
Admission: EM | Admit: 2022-11-06 | Discharge: 2022-11-06 | Disposition: A | Discharge status: Home / Self Care | Payer: Medicare Other

## 2022-11-06 ENCOUNTER — Encounter: Payer: Self-pay | Admitting: Emergency Medicine

## 2022-11-06 DIAGNOSIS — S7012XA Contusion of left thigh, initial encounter: Secondary | ICD-10-CM | POA: Diagnosis not present

## 2022-11-06 DIAGNOSIS — S7002XA Contusion of left hip, initial encounter: Secondary | ICD-10-CM | POA: Diagnosis not present

## 2022-11-06 DIAGNOSIS — M25552 Pain in left hip: Secondary | ICD-10-CM | POA: Diagnosis not present

## 2022-11-06 NOTE — Discharge Instructions (Addendum)
The xray of the left hip is negative for fracture or dislocation. It appears you may have bruised the left hip.  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 Continue your current pain medication as previously prescribed. Apply ice or heat as needed.  Apply ice for pain or swelling, heat for spasm or stiffness.  Apply for 20 minutes, remove for 1 hour, then remove heat as needed. Gentle range of motion exercises with the left hip to prevent joint and mobility. If your symptoms are not improving over the next 1 to 2 weeks, or appear to be worsening, please follow-up with orthopedics for further evaluation.  You can follow-up with [facility] of  or with [facility] for further evaluation. Follow-up as needed.

## 2022-11-06 NOTE — ED Triage Notes (Signed)
Larey Seat out of bed this morning landing on left hip.  Pain from hip down to left knee.

## 2022-11-06 NOTE — ED Provider Notes (Signed)
[facility]    CSN: [id number] Arrival date & time: 11/06/22  1648      History   Chief Complaint No chief complaint on file.   HPI Calvin Moreno is a 82 y.o. male.   The history is provided by the patient.   Patient presents for complaints of left hip pain.  Patient states this morning, he rolled out of his bed landing directly onto the left hip.  Since that time, he has pain from the left hip down to the left knee.  He states the area is tender to touch.  He states that he also has been unable to move the left leg as he normally does.  Patient denies numbness or tingling, although he does have a history of neuropathy, bruising, or redness.  He reports that he takes hydrocodone for other chronic pain, which he took this morning.  Patient states since the fall, he has had to use his cane to ambulate.  Patient denies previous injury or trauma to the left hip.  Past Medical History:  Diagnosis Date   Aortic stenosis, mod 2021   And insufficiency, evaluation by Dr. Daleen Squibb in 2000   Arthritis    Benign prostatic hypertrophy    PSA of 1.39 in 09/2010   Chronic kidney disease    Borderline; creatinine of 1.6 in 08/2009, but 1.15 in 05/2010   Chronic sinusitis    By MRI   Diverticulosis 2013   Hearing impaired    Hemorrhoids 2006   Hiatal hernia 2006   HOH (hard of hearing)    Hyperlipidemia    Elevated triglycerides   Hypertension    Mild internal carotid artery plaque on MRI/MRA in 2002   IBS (irritable bowel syndrome)    Insomnia    Left bundle branch block 2012   2012   Metabolic syndrome    Fasting hyperglycemia   Murmur    Nephrolithiasis 08/2009   2011   Obesity    Proctitis    radiation induced   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   s/p XRT   Seasonal allergies    Stroke (HCC)    TIA, no residual, seen on MRI   Tobacco abuse, in remission    60 pack years; quit in 1999   Uses hearing aid    bilateral    Patient Active Problem List   Diagnosis  Date Noted   Benign prostatic hyperplasia with urinary obstruction 10/09/2020   Weak urinary stream 10/09/2020   Urinary frequency 10/09/2020   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   Rectal bleed 11/06/2011   Bowel habit changes 11/06/2011   Aortic stenosis, mild    Hyperlipidemia    Left bundle branch block    Tobacco abuse, in remission    Metabolic syndrome    Hypertension    OBESITY 07/02/2010   Calculus, renal 07/02/2010    Past Surgical History:  Procedure Laterality Date   ANKLE FRACTURE SURGERY     left   CATARACT EXTRACTION Left    CATARACT EXTRACTION W/PHACO Right 12/19/2013   Procedure: CATARACT EXTRACTION PHACO AND INTRAOCULAR LENS PLACEMENT RIGHT EYE CDE=14.78;  Surgeon: Gemma Payor, MD;  Location: [facility];  Service: Ophthalmology;  Laterality: Right;   CHOLECYSTECTOMY     COLONOSCOPY  2006   Dr. Henry Russel mucosa throughout colon, moderate internal hemorrhoids. bx= benign colonic mucosa   COLONOSCOPY  12/03/2011   Dr. Jena Gauss- colonic diverticulosis, radiation-induced proctitis- s/p APC ablation, no microscopic colitis on bx   COLONOSCOPY WITH  PROPOFOL N/A 06/12/2020   Procedure: COLONOSCOPY WITH PROPOFOL;  Surgeon: Corbin Ade, MD;  [facility];  Service: Endoscopy;  Laterality: N/A;  2:45pm   ESOPHAGOGASTRODUODENOSCOPY  2006   Dr. Randa Evens- small hiatal hernia and slightly reddened distal esophagus o/w normal   FLEXIBLE SIGMOIDOSCOPY N/A 07/13/2013   [id number] changes of the rectum with active oozing consistent with chronic radiation proctitis.  Otherwise, negative sigmoidoscopy to 40 cm.Status post argon plasma coagulation ablation    HOT HEMOSTASIS N/A 07/13/2013   Procedure: HOT HEMOSTASIS (ARGON PLASMA COAGULATION/BICAP);  Surgeon: Corbin Ade, MD;  [facility];  Service: Endoscopy;  Laterality: N/A;   Ileocolonoscopy  12/03/2011   [id number] diverticulosis. Radiation-induced proctitis status post APC ablation. Status post segmental  colon Biopsy   TONSILLECTOMY         Home Medications    Prior to Admission medications   Medication Sig Start Date End Date Taking? Authorizing Provider  HYDROcodone-acetaminophen (NORCO/VICODIN) 5-325 MG tablet Take 1 tablet by mouth every 6 (six) hours as needed for moderate pain.   Yes [provider]  acyclovir (ZOVIRAX) 800 MG tablet Take 800 mg by mouth See admin instructions. Take 800 mg daily, increase to 800 mg 3 times daily as needed when having a fever blister flare    [provider]  ALPRAZolam (XANAX) 0.5 MG tablet Take 0.5 mg by mouth at bedtime as needed for sleep.    [provider]  Ascorbic Acid (VITAMIN C) 1000 MG tablet Take 1,000 mg by mouth daily.    [provider]  aspirin EC 81 MG tablet Take 81 mg by mouth at bedtime.    [provider]  fexofenadine (ALLEGRA) 180 MG tablet Take 180 mg by mouth daily as needed for allergies.    [provider]  finasteride (PROSCAR) 5 MG tablet Take 1 tablet (5 mg total) by mouth daily. 08/13/22   McKenzie, Mardene Celeste, MD  furosemide (LASIX) 40 MG tablet Take 40 mg by mouth daily. 12/10/21   [provider]  hydrochlorothiazide (HYDRODIURIL) 12.5 MG tablet Take 12.5 mg by mouth daily. 01/03/20   [provider]  meclizine (ANTIVERT) 25 MG tablet Take 25 mg by mouth at bedtime.    [provider]  Melatonin 3 MG TABS Take 3 mg by mouth at bedtime. For sleep    [provider]  pantoprazole (PROTONIX) 40 MG tablet Take 1 tablet (40 mg total) by mouth daily. 03/25/22   Rourk, Gerrit Friends, MD  Polyethyl Glycol-Propyl Glycol (SYSTANE OP) Place 1 drop into both eyes 2 (two) times daily.    [provider]  silodosin (RAPAFLO) 8 MG CAPS capsule Take 1 capsule (8 mg total) by mouth at bedtime. 08/14/22   McKenzie, Mardene Celeste, MD  simvastatin (ZOCOR) 20 MG tablet Take 20 mg by mouth every evening.    [provider]  vitamin B-12  (CYANOCOBALAMIN) 1000 MCG tablet Take 2,000 mcg by mouth daily.    [provider]    Family History Family History  Problem Relation Age of Onset   Heart failure Mother    Heart attack Father    Heart failure Brother        bladder cancer (agent orange exposure)   Cirrhosis Brother        chronic viral hepatitis   Colon cancer Neg Hx     Social History Social History   Tobacco Use   Smoking status: Former    Packs/day:  1.50    Years: 40.00    Additional pack years: 0.00    Total pack years: 60.00    Types: Cigarettes    Quit date: 11/04/1997    Years since quitting: 25.0    Passive exposure: Never   Smokeless tobacco: Never   Tobacco comments:    Quit in 1999  Vaping Use   Vaping Use: Never used  Substance Use Topics   Alcohol use: No    Alcohol/week: 0.0 standard drinks of alcohol   Drug use: No     Allergies   Norvasc [amlodipine besylate], Codeine, and Amoxicillin   Review of Systems Review of Systems Per HPI  Physical Exam Triage Vital Signs ED Triage Vitals  Enc Vitals Group     BP 11/06/22 1729 114/67     Pulse Rate 11/06/22 1729 (!) 102     Resp 11/06/22 1729 20     Temp 11/06/22 1729 97.6 F (36.4 C)     Temp Source 11/06/22 1729 Oral     SpO2 11/06/22 1729 93 %     Weight --      Height --      Head Circumference --      Peak Flow --      Pain Score 11/06/22 1730 8     Pain Loc --      Pain Edu? --      Excl. in GC? --    No data found.  Updated Vital Signs BP 114/67 (BP Location: Right Arm)   Pulse (!) 102   Temp 97.6 F (36.4 C) (Oral)   Resp 20   SpO2 93%   Visual Acuity Right Eye Distance:   Left Eye Distance:   Bilateral Distance:    Right Eye Near:   Left Eye Near:    Bilateral Near:     Physical Exam Vitals and nursing note reviewed.  Constitutional:      General: He is not in acute distress.    Appearance: Normal appearance.  HENT:     Head: Normocephalic.  Eyes:     Extraocular Movements:  Extraocular movements intact.     Pupils: Pupils are equal, round, and reactive to light.  Pulmonary:     Effort: Pulmonary effort is normal.  Musculoskeletal:     Cervical back: Normal range of motion.     Left hip: Tenderness present. No deformity or crepitus. Decreased range of motion.     Comments: Tenderness noted to the greater trochanter.  There is no obvious deformity, swelling, or ecchymosis present  Skin:    General: Skin is warm and dry.  Neurological:     General: No focal deficit present.     Mental Status: He is alert and oriented to person, place, and time.  Psychiatric:        Mood and Affect: Mood normal.        Behavior: Behavior normal.      UC Treatments / Results  Labs (all labs ordered are listed, but only abnormal results are displayed) Labs Reviewed - No data to display  EKG   Radiology DG Hip Unilat With Pelvis 2-3 Views Left  Result Date: 11/06/2022 CLINICAL DATA:  Left hip pain, fall out of bed landing on left hip. EXAM: DG HIP (WITH OR WITHOUT PELVIS) 2-3V LEFT COMPARISON:  None Available. FINDINGS: The bones are diffusely under mineralized. No evidence of acute fracture of the pelvis or left hip. The femoral head is well seated. Pubic rami are  intact. Pubic symphysis and sacroiliac joints are congruent. There is mild bilateral hip osteoarthritis. Vascular calcifications are seen. IMPRESSION: 1. No fracture of the pelvis or left hip. 2. Mild bilateral hip osteoarthritis. Electronically Signed   By: Narda Rutherford M.D.   On: 11/06/2022 18:36    Procedures Procedures (including critical care time)  Medications Ordered in UC Medications - No data to display  Initial Impression / Assessment and Plan / UC Course  I have reviewed the triage vital signs and the nursing notes.  Pertinent labs & imaging results that were available during my care of the patient were reviewed by me and considered in my medical decision making (see chart for  details).  The patient is well-appearing, he is in no acute distress, vital signs are stable.  X-ray of the left hip is negative for fracture or dislocation.  Symptoms appear to be consistent with a contusion based on the mechanism of injury..  Patient does have bilateral hip osteoarthritis as seen on x-ray.  Supportive care recommendations were provided and discussed with the patient to include continuing his current pain management regimen, the application of ice or heat, and gentle range of motion exercises to promote joint mobility.  Patient was advised to follow-up with orthopedics if symptoms do not improve.  Patient was given information for [facility] [place] and for [facility].  Patient was in agreement with this plan of care and verbalizes understanding.  All questions were answered.  Patient stable for discharge.   Final Clinical Impressions(s) / UC Diagnoses   Final diagnoses:  Contusion of left hip and thigh, initial encounter  Left hip pain     Discharge Instructions      The xray of the left hip is negative for fracture or dislocation. It appears you may have bruised the left hip.  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 Continue your current pain medication as previously prescribed. Apply ice or heat as needed.  Apply ice for pain or swelling, heat for spasm or stiffness.  Apply for 20 minutes, remove for 1 hour, then remove heat as needed. Gentle range of motion exercises with the left hip to prevent joint and mobility. If your symptoms are not improving over the next 1 to 2 weeks, or appear to be worsening, please follow-up with orthopedics for further evaluation.  You can follow-up with [facility] [place] or with [facility] for further evaluation. Follow-up as needed.     ED Prescriptions   None    PDMP not reviewed this encounter.   Abran Cantor, NP 11/06/22 1915

## 2022-11-25 ENCOUNTER — Encounter: Payer: Self-pay | Admitting: Internal Medicine

## 2022-11-25 ENCOUNTER — Ambulatory Visit (INDEPENDENT_AMBULATORY_CARE_PROVIDER_SITE_OTHER): Payer: Medicare Other | Admitting: Internal Medicine

## 2022-11-25 VITALS — BP 117/66 | HR 89 | Temp 97.6°F | Ht 65.0 in | Wt 162.0 lb

## 2022-11-25 DIAGNOSIS — R1013 Epigastric pain: Secondary | ICD-10-CM | POA: Diagnosis not present

## 2022-11-25 DIAGNOSIS — R1319 Other dysphagia: Secondary | ICD-10-CM

## 2022-11-25 NOTE — Patient Instructions (Addendum)
It was good to see you again today!  As discussed to further evaluate your burping and difficulty swallowing we will proceed with an EGD with possible esophageal dilation (dyspepsia and dysphagia) ASA 3.  For now, increase your Protonix or pantoprazole to 40 mg twice daily 30 minutes before breakfast and supper (new prescription dispense 60 with 3 refills.)

## 2022-11-25 NOTE — Progress Notes (Unsigned)
Primary Care Physician:  Gareth Morgan, MD Primary Gastroenterologist:  Dr. Jena Gauss  Pre-Procedure History & Physical: HPI:  Calvin Moreno is a 82 y.o. male here for further evaluation of worsening reflux symptoms, dyspepsia and esophageal dysphagia.  Patient has been on pantoprazole 40 mg daily traditionally with good control of reflux symptoms recently he has had a flare having more belching burping and notes that he has had intermittent esophageal dysphagia feeling that things are getting caught behind his breastbone.  For radiation proctitis status post APC.  Not having any rectal bleeding.  His last EGD was in 2006 by Dr. Vilinda Boehringer in [place].  It was pretty much unremarkable.  No stricture or Barrett's esophagus.  Past Medical History:  Diagnosis Date   Aortic stenosis, mod 2021   And insufficiency, evaluation by Dr. Daleen Squibb in 2000   Arthritis    Benign prostatic hypertrophy    PSA of 1.39 in 09/2010   Chronic kidney disease    Borderline; creatinine of 1.6 in 08/2009, but 1.15 in 05/2010   Chronic sinusitis    By MRI   Diverticulosis 2013   Hearing impaired    Hemorrhoids 2006   Hiatal hernia 2006   HOH (hard of hearing)    Hyperlipidemia    Elevated triglycerides   Hypertension    Mild internal carotid artery plaque on MRI/MRA in 2002   IBS (irritable bowel syndrome)    Insomnia    Left bundle branch block 2012   2012   Metabolic syndrome    Fasting hyperglycemia   Murmur    Nephrolithiasis 08/2009   2011   Obesity    Proctitis    radiation induced   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   s/p XRT   Seasonal allergies    Stroke (HCC)    TIA, no residual, seen on MRI   Tobacco abuse, in remission    60 pack years; quit in 1999   Uses hearing aid    bilateral    Past Surgical History:  Procedure Laterality Date   ANKLE FRACTURE SURGERY     left   CATARACT EXTRACTION Left    CATARACT EXTRACTION W/PHACO Right 12/19/2013   Procedure: CATARACT EXTRACTION  PHACO AND INTRAOCULAR LENS PLACEMENT RIGHT EYE CDE=14.78;  Surgeon: Gemma Payor, MD;  Location: [facility];  Service: Ophthalmology;  Laterality: Right;   CHOLECYSTECTOMY     COLONOSCOPY  2006   Dr. Henry Russel mucosa throughout colon, moderate internal hemorrhoids. bx= benign colonic mucosa   COLONOSCOPY  12/03/2011   Dr. Jena Gauss- colonic diverticulosis, radiation-induced proctitis- s/p APC ablation, no microscopic colitis on bx   COLONOSCOPY WITH PROPOFOL N/A 06/12/2020   Procedure: COLONOSCOPY WITH PROPOFOL;  Surgeon: Corbin Ade, MD;  Location: [facility];  Service: Endoscopy;  Laterality: N/A;  2:45pm   ESOPHAGOGASTRODUODENOSCOPY  2006   Dr. Randa Evens- small hiatal hernia and slightly reddened distal esophagus o/w normal   FLEXIBLE SIGMOIDOSCOPY N/A 07/13/2013   [id number] changes of the rectum with active oozing consistent with chronic radiation proctitis.  Otherwise, negative sigmoidoscopy to 40 cm.Status post argon plasma coagulation ablation    HOT HEMOSTASIS N/A 07/13/2013   Procedure: HOT HEMOSTASIS (ARGON PLASMA COAGULATION/BICAP);  Surgeon: Corbin Ade, MD;  Location: [facility];  Service: Endoscopy;  Laterality: N/A;   Ileocolonoscopy  12/03/2011   [id number] diverticulosis. Radiation-induced proctitis status post APC ablation. Status post segmental colon Biopsy   TONSILLECTOMY      Prior to Admission medications  Medication Sig Start Date End Date Taking? Authorizing Provider  acyclovir (ZOVIRAX) 800 MG tablet Take 800 mg by mouth See admin instructions. Take 800 mg daily, increase to 800 mg 3 times daily as needed when having a fever blister flare   Yes [provider]  alfuzosin (UROXATRAL) 10 MG 24 hr tablet Take 10 mg by mouth at bedtime. 10/28/22  Yes [provider]  ALPRAZolam Prudy Feeler) 0.5 MG tablet Take 0.5 mg by mouth at bedtime as needed for sleep.   Yes [provider]  Ascorbic Acid (VITAMIN C) 1000 MG tablet Take 1,000 mg by mouth  daily.   Yes [provider]  aspirin EC 81 MG tablet Take 81 mg by mouth at bedtime.   Yes [provider]  fexofenadine (ALLEGRA) 180 MG tablet Take 180 mg by mouth daily as needed for allergies.   Yes [provider]  finasteride (PROSCAR) 5 MG tablet Take 1 tablet (5 mg total) by mouth daily. 08/13/22  Yes McKenzie, Mardene Celeste, MD  furosemide (LASIX) 40 MG tablet Take 40 mg by mouth daily. 12/10/21  Yes [provider]  gabapentin (NEURONTIN) 100 MG capsule Take by mouth. 09/09/22  Yes [provider]  hydrochlorothiazide (HYDRODIURIL) 12.5 MG tablet Take 12.5 mg by mouth daily. 01/03/20  Yes [provider]  HYDROcodone-acetaminophen (NORCO/VICODIN) 5-325 MG tablet Take 1 tablet by mouth every 6 (six) hours as needed for moderate pain.   Yes [provider]  levothyroxine (SYNTHROID) 25 MCG tablet Take 25 mcg by mouth daily. 10/24/22  Yes [provider]  meclizine (ANTIVERT) 25 MG tablet Take 25 mg by mouth at bedtime.   Yes [provider]  Melatonin 3 MG TABS Take 3 mg by mouth at bedtime. For sleep   Yes [provider]  pantoprazole (PROTONIX) 40 MG tablet Take 1 tablet (40 mg total) by mouth daily. 03/25/22  Yes Toriana Sponsel, Gerrit Friends, MD  Polyethyl Glycol-Propyl Glycol (SYSTANE OP) Place 1 drop into both eyes 2 (two) times daily.   Yes [provider]  silodosin (RAPAFLO) 8 MG CAPS capsule Take 1 capsule (8 mg total) by mouth at bedtime. 08/14/22  Yes McKenzie, Mardene Celeste, MD  simvastatin (ZOCOR) 20 MG tablet Take 20 mg by mouth every evening.   Yes [provider]  tamsulosin (FLOMAX) 0.4 MG CAPS capsule Take 0.4 mg by mouth daily. 11/14/22  Yes [provider]  vitamin B-12 (CYANOCOBALAMIN) 1000 MCG tablet Take 2,000 mcg by mouth daily.   Yes [provider]    Allergies as of 11/25/2022 - Review Complete 11/25/2022  Allergen Reaction Noted   Norvasc [amlodipine besylate]  Swelling 09/19/2014   Codeine Nausea And Vomiting 11/06/2011   Amoxicillin Rash 12/16/2021    Family History  Problem Relation Age of Onset   Heart failure Mother    Heart attack Father    Heart failure Brother        bladder cancer (agent orange exposure)   Cirrhosis Brother        chronic viral hepatitis   Colon cancer Neg Hx     Social History   Socioeconomic History   Marital status: Married    Spouse name: Not on file   Number of children: 2   Years of education: Not on file   Highest education level: Not on file  Occupational History   Occupation: Retired    Associate Professor: [employer]  Tobacco Use   Smoking status: Former  Packs/day: 1.50    Years: 40.00    Additional pack years: 0.00    Total pack years: 60.00    Types: Cigarettes    Quit date: 11/04/1997    Years since quitting: 25.0    Passive exposure: Never   Smokeless tobacco: Never   Tobacco comments:    Quit in 1999  Vaping Use   Vaping Use: Never used  Substance and Sexual Activity   Alcohol use: No    Alcohol/week: 0.0 standard drinks of alcohol   Drug use: No   Sexual activity: Yes  Other Topics Concern   Not on file  Social History Narrative   Married with 2 children   Lives locally   No regular exercise   Social Determinants of Health   Financial Resource Strain: Not on file  Food Insecurity: Not on file  Transportation Needs: Not on file  Physical Activity: Not on file  Stress: Not on file  Social Connections: Not on file  Intimate Partner Violence: Not on file    Review of Systems: See HPI, otherwise negative ROS  Physical Exam: BP 117/66 (BP Location: Right Arm, Patient Position: Sitting, Cuff Size: Normal)   Pulse 89   Temp 97.6 F (36.4 C) (Oral)   Ht 5\' 5"  (1.651 m)   Wt 162 lb (73.5 kg)   SpO2 95%   BMI 26.96 kg/m  General:   Alert, elderly frail, pleasant and cooperative in NAD Neck:  Supple; no masses or thyromegaly. No significant cervical  adenopathy. Lungs:  Clear throughout to auscultation.   No wheezes, crackles, or rhonchi. No acute distress. Heart:  Regular rate and rhythm; no murmurs, clicks, rubs,  or gallops. Abdomen: Non-distended, normal bowel sounds.  Soft and nontender without appreciable mass or hepatosplenomegaly.   Impression/Plan: 82 year old gentleman with longstanding GERD traditionally well-controlled on once daily PPI.  Increased trouble with belching and burping consistent with dyspepsia and now esophageal dysphagia. These symptoms warrant further evaluation.  I agree with Dr. Sudie Bailey further GI evaluation is warranted.  I have offered the patient a diagnostic EGD with a potential for esophageal dilation. The risks, benefits, limitations, alternatives and imponderables have been reviewed with the patient. Potential for esophageal dilation, biopsy, etc. have also been reviewed.  Questions have been answered. All parties agreeable.   In addition, I recommended he increase pantoprazole to 40 mg twice daily-30 minutes before breakfast and supper.  New prescription provided.  Further recommendations to follow.   Notice: This dictation was prepared with Dragon dictation along with smaller phrase technology. Any transcriptional errors that result from this process are unintentional and may not be corrected upon review.

## 2022-11-26 ENCOUNTER — Telehealth: Payer: Self-pay

## 2022-11-26 NOTE — Telephone Encounter (Signed)
Per Dr. Ronne Binning last office note "start rapaflo 8mg  daily and stop uroxatral and Flomax" Patient made aware and voiced understanding.

## 2022-11-26 NOTE — Telephone Encounter (Signed)
Patient is wanting to know if he will need to continue to take alfuzosin (UROXATRAL) 10 MG 24 hr tablet? Will need refills.  [facility] - [place], [place] - [address] Phone: [phone]  Fax: [phone]     Please advise.

## 2022-11-28 ENCOUNTER — Other Ambulatory Visit: Payer: Self-pay

## 2022-11-28 MED ORDER — PANTOPRAZOLE SODIUM 40 MG PO TBEC: 40.0000 mg | DELAYED_RELEASE_TABLET | Freq: Two times a day (BID) | ORAL | 11 refills | Status: DC

## 2022-12-05 ENCOUNTER — Telehealth: Payer: Self-pay | Admitting: *Deleted

## 2022-12-05 NOTE — Telephone Encounter (Signed)
Pt left vm wanting to know when his procedure would be scheduled.  Pt was informed that waiting on providers August schedule to get him scheduled. Verbalized understanding.

## 2022-12-09 ENCOUNTER — Ambulatory Visit: Payer: Medicare Other | Admitting: Internal Medicine

## 2022-12-09 ENCOUNTER — Telehealth: Payer: Self-pay | Admitting: *Deleted

## 2022-12-09 NOTE — Telephone Encounter (Signed)
Spoke with pt. Scheduled EGD/ED with propofol asa 3 on 8/7. Aware will call back with pre-op appt details. Discussed EGD instructions in detail. Will also send instructions via mail.

## 2022-12-10 ENCOUNTER — Encounter: Payer: Self-pay | Admitting: *Deleted

## 2022-12-25 NOTE — Patient Instructions (Signed)
Calvin Moreno  12/25/2022     @PREFPERIOPPHARMACY @   Your procedure is scheduled on  12/31/2022.   Report to [facility] at  1045  A.M.   Call this number if you have problems the morning of surgery:  [phone]  If you experience any cold or flu symptoms such as cough, fever, chills, shortness of breath, etc. between now and your scheduled surgery, please notify us at the above number.   Remember:  Follow the diet and prep instructions given to you by the office.     Take these medicines the morning of surgery with A SIP OF WATER           allegra, proscar, gabapentin, hydrocodone(if needed), levothyroxine, pantoprazole, flomax.     Do not wear jewelry, make-up or nail polish, including gel polish,  artificial nails, or any other type of covering on natural nails (fingers and  toes).  Do not wear lotions, powders, or perfumes, or deodorant.  Do not shave 48 hours prior to surgery.  Men may shave face and neck.  Do not bring valuables to the hospital.  [facility] is not responsible for any belongings or valuables.  Contacts, dentures or bridgework may not be worn into surgery.  Leave your suitcase in the car.  After surgery it may be brought to your room.  For patients admitted to the hospital, discharge time will be determined by your treatment team.  Patients discharged the day of surgery will not be allowed to drive home and must have someone with them for 24 hours.     Special instructions:   DO NOT smoke tobacco or vape for 24 hours before your procedure.  Please read over the following fact sheets that you were given. Anesthesia Post-op Instructions and Care and Recovery After Surgery      Upper Endoscopy, Adult, Care After After the procedure, it is common to have a sore throat. It is also common to have: Mild stomach pain or discomfort. Bloating. Nausea. Follow these instructions at home: The instructions below may help you care for  yourself at home. Your health care provider may give you more instructions. If you have questions, ask your health care provider. If you were given a sedative during the procedure, it can affect you for several hours. Do not drive or operate machinery until your health care provider says that it is safe. If you will be going home right after the procedure, plan to have a responsible adult: Take you home from the hospital or clinic. You will not be allowed to drive. Care for you for the time you are told. Follow instructions from your health care provider about what you may eat and drink. Return to your normal activities as told by your health care provider. Ask your health care provider what activities are safe for you. Take over-the-counter and prescription medicines only as told by your health care provider. Contact a health care provider if you: Have a sore throat that lasts longer than one day. Have trouble swallowing. Have a fever. Get help right away if you: Vomit blood or your vomit looks like coffee grounds. Have bloody, black, or tarry stools. Have a very bad sore throat or you cannot swallow. Have difficulty breathing or very bad pain in your chest or abdomen. These symptoms may be an emergency. Get help right away. Call 911. Do not wait to see if the symptoms will go away. Do not  drive yourself to the hospital. Summary After the procedure, it is common to have a sore throat, mild stomach discomfort, bloating, and nausea. If you were given a sedative during the procedure, it can affect you for several hours. Do not drive until your health care provider says that it is safe. Follow instructions from your health care provider about what you may eat and drink. Return to your normal activities as told by your health care provider. This information is not intended to replace advice given to you by your health care provider. Make sure you discuss any questions you have with your health  care provider. Document Revised: 08/21/2021 Document Reviewed: 08/21/2021 Elsevier Patient Education  2024 [employer]. Esophageal Dilatation Esophageal dilatation, also called esophageal dilation, is a procedure to widen or open a blocked or narrowed part of the esophagus. The esophagus is the part of the body that moves food and liquid from the mouth to the stomach. You may need this procedure if: You have a buildup of scar tissue in your esophagus that makes it difficult, painful, or impossible to swallow. This can be caused by gastroesophageal reflux disease (GERD). You have cancer of the esophagus. There is a problem with how food moves through your esophagus. In some cases, you may need this procedure repeated at a later time to dilate the esophagus gradually. Tell a health care provider about: Any allergies you have. All medicines you are taking, including vitamins, herbs, eye drops, creams, and over-the-counter medicines. Any problems you or family members have had with anesthetic medicines. Any blood disorders you have. Any surgeries you have had. Any medical conditions you have. Any antibiotic medicines you are required to take before dental procedures. Whether you are pregnant or may be pregnant. What are the risks? Generally, this is a safe procedure. However, problems may occur, including: Bleeding due to a tear in the lining of the esophagus. A hole, or perforation, in the esophagus. What happens before the procedure? Ask your health care provider about: Changing or stopping your regular medicines. This is especially important if you are taking diabetes medicines or blood thinners. Taking medicines such as aspirin and ibuprofen. These medicines can thin your blood. Do not take these medicines unless your health care provider tells you to take them. Taking over-the-counter medicines, vitamins, herbs, and supplements. Follow instructions from your health care provider about  eating or drinking restrictions. Plan to have a responsible adult take you home from the hospital or clinic. Plan to have a responsible adult care for you for the time you are told after you leave the hospital or clinic. This is important. What happens during the procedure? You may be given a medicine to help you relax (sedative). A numbing medicine may be sprayed into the back of your throat, or you may gargle the medicine. Your health care provider may perform the dilatation using various surgical instruments, such as: Simple dilators. This instrument is carefully placed in the esophagus to stretch it. Guided wire bougies. This involves using an endoscope to insert a wire into the esophagus. A dilator is passed over this wire to enlarge the esophagus. Then the wire is removed. Balloon dilators. An endoscope with a small balloon is inserted into the esophagus. The balloon is inflated to stretch the esophagus and open it up. The procedure may vary among health care providers and hospitals. What can I expect after the procedure? Your blood pressure, heart rate, breathing rate, and blood oxygen level will be monitored until  you leave the hospital or clinic. Your throat may feel slightly sore and numb. This will get better over time. You will not be allowed to eat or drink until your throat is no longer numb. When you are able to drink, urinate, and sit on the edge of the bed without nausea or dizziness, you may be able to return home. Follow these instructions at home: Take over-the-counter and prescription medicines only as told by your health care provider. If you were given a sedative during the procedure, it can affect you for several hours. Do not drive or operate machinery until your health care provider says that it is safe. Plan to have a responsible adult care for you for the time you are told. This is important. Follow instructions from your health care provider about any eating or  drinking restrictions. Do not use any products that contain nicotine or tobacco, such as cigarettes, e-cigarettes, and chewing tobacco. If you need help quitting, ask your health care provider. Keep all follow-up visits. This is important. Contact a health care provider if: You have a fever. You have pain that is not relieved by medicine. Get help right away if: You have chest pain. You have trouble breathing. You have trouble swallowing. You vomit blood. You have black, tarry, or bloody stools. These symptoms may represent a serious problem that is an emergency. Do not wait to see if the symptoms will go away. Get medical help right away. Call your local emergency services (911 in the [place] Do not drive yourself to the hospital. Summary Esophageal dilatation, also called esophageal dilation, is a procedure to widen or open a blocked or narrowed part of the esophagus. Plan to have a responsible adult take you home from the hospital or clinic. For this procedure, a numbing medicine may be sprayed into the back of your throat, or you may gargle the medicine. Do not drive or operate machinery until your health care provider says that it is safe. This information is not intended to replace advice given to you by your health care provider. Make sure you discuss any questions you have with your health care provider. Document Revised: 09/28/2019 Document Reviewed: 09/28/2019 Elsevier Patient Education  2024 Elsevier Inc. Monitored Anesthesia Care, Care After The following information offers guidance on how to care for yourself after your procedure. Your health care provider may also give you more specific instructions. If you have problems or questions, contact your health care provider. What can I expect after the procedure? After the procedure, it is common to have: Tiredness. Little or no memory about what happened during or after the procedure. Impaired judgment when it comes to making  decisions. Nausea or vomiting. Some trouble with balance. Follow these instructions at home: For the time period you were told by your health care provider:  Rest. Do not participate in activities where you could fall or become injured. Do not drive or use machinery. Do not drink alcohol. Do not take sleeping pills or medicines that cause drowsiness. Do not make important decisions or sign legal documents. Do not take care of children on your own. Medicines Take over-the-counter and prescription medicines only as told by your health care provider. If you were prescribed antibiotics, take them as told by your health care provider. Do not stop using the antibiotic even if you start to feel better. Eating and drinking Follow instructions from your health care provider about what you may eat and drink. Drink enough fluid to keep your  urine pale yellow. If you vomit: Drink clear fluids slowly and in small amounts as you are able. Clear fluids include water, ice chips, low-calorie sports drinks, and fruit juice that has water added to it (diluted fruit juice). Eat light and bland foods in small amounts as you are able. These foods include bananas, applesauce, rice, lean meats, toast, and crackers. General instructions  Have a responsible adult stay with you for the time you are told. It is important to have someone help care for you until you are awake and alert. If you have sleep apnea, surgery and some medicines can increase your risk for breathing problems. Follow instructions from your health care provider about wearing your sleep device: When you are sleeping. This includes during daytime naps. While taking prescription pain medicines, sleeping medicines, or medicines that make you drowsy. Do not use any products that contain nicotine or tobacco. These products include cigarettes, chewing tobacco, and vaping devices, such as e-cigarettes. If you need help quitting, ask your health care  provider. Contact a health care provider if: You feel nauseous or vomit every time you eat or drink. You feel light-headed. You are still sleepy or having trouble with balance after 24 hours. You get a rash. You have a fever. You have redness or swelling around the IV site. Get help right away if: You have trouble breathing. You have new confusion after you get home. These symptoms may be an emergency. Get help right away. Call 911. Do not wait to see if the symptoms will go away. Do not drive yourself to the hospital. This information is not intended to replace advice given to you by your health care provider. Make sure you discuss any questions you have with your health care provider. Document Revised: 10/07/2021 Document Reviewed: 10/07/2021 Elsevier Patient Education  2024 [employer].

## 2022-12-26 ENCOUNTER — Other Ambulatory Visit: Payer: Self-pay

## 2022-12-26 ENCOUNTER — Observation Stay (HOSPITAL_COMMUNITY)
Admission: EM | Admit: 2022-12-26 | Discharge: 2022-12-27 | Disposition: A | Discharge status: Home / Self Care | Payer: Medicare Other | Attending: Family Medicine | Admitting: Family Medicine

## 2022-12-26 ENCOUNTER — Encounter (HOSPITAL_COMMUNITY): Admission: RE | Admit: 2022-12-26 | Payer: Medicare Other | Source: Ambulatory Visit

## 2022-12-26 ENCOUNTER — Encounter (HOSPITAL_COMMUNITY): Payer: Self-pay

## 2022-12-26 VITALS — BP 105/63 | HR 92 | Temp 97.8°F | Resp 18 | Ht 65.0 in | Wt 162.0 lb

## 2022-12-26 DIAGNOSIS — R9431 Abnormal electrocardiogram [ECG] [EKG]: Secondary | ICD-10-CM | POA: Insufficient documentation

## 2022-12-26 DIAGNOSIS — Z87891 Personal history of nicotine dependence: Secondary | ICD-10-CM | POA: Insufficient documentation

## 2022-12-26 DIAGNOSIS — G629 Polyneuropathy, unspecified: Secondary | ICD-10-CM | POA: Diagnosis not present

## 2022-12-26 DIAGNOSIS — E782 Mixed hyperlipidemia: Secondary | ICD-10-CM | POA: Diagnosis present

## 2022-12-26 DIAGNOSIS — I129 Hypertensive chronic kidney disease with stage 1 through stage 4 chronic kidney disease, or unspecified chronic kidney disease: Secondary | ICD-10-CM | POA: Insufficient documentation

## 2022-12-26 DIAGNOSIS — Z7982 Long term (current) use of aspirin: Secondary | ICD-10-CM | POA: Diagnosis not present

## 2022-12-26 DIAGNOSIS — Z8673 Personal history of transient ischemic attack (TIA), and cerebral infarction without residual deficits: Secondary | ICD-10-CM | POA: Diagnosis not present

## 2022-12-26 DIAGNOSIS — E876 Hypokalemia: Secondary | ICD-10-CM | POA: Diagnosis not present

## 2022-12-26 DIAGNOSIS — Z8546 Personal history of malignant neoplasm of prostate: Secondary | ICD-10-CM | POA: Diagnosis not present

## 2022-12-26 DIAGNOSIS — N189 Chronic kidney disease, unspecified: Secondary | ICD-10-CM | POA: Insufficient documentation

## 2022-12-26 DIAGNOSIS — I10 Essential (primary) hypertension: Secondary | ICD-10-CM | POA: Diagnosis present

## 2022-12-26 DIAGNOSIS — K219 Gastro-esophageal reflux disease without esophagitis: Secondary | ICD-10-CM | POA: Diagnosis not present

## 2022-12-26 DIAGNOSIS — Z79899 Other long term (current) drug therapy: Secondary | ICD-10-CM | POA: Insufficient documentation

## 2022-12-26 DIAGNOSIS — E039 Hypothyroidism, unspecified: Secondary | ICD-10-CM | POA: Insufficient documentation

## 2022-12-26 DIAGNOSIS — E785 Hyperlipidemia, unspecified: Secondary | ICD-10-CM | POA: Diagnosis present

## 2022-12-26 DIAGNOSIS — Z01818 Encounter for other preprocedural examination: Secondary | ICD-10-CM | POA: Insufficient documentation

## 2022-12-26 DIAGNOSIS — R7989 Other specified abnormal findings of blood chemistry: Secondary | ICD-10-CM | POA: Diagnosis present

## 2022-12-26 HISTORY — DX: Hypothyroidism, unspecified: E03.9

## 2022-12-26 HISTORY — DX: Gastro-esophageal reflux disease without esophagitis: K21.9

## 2022-12-26 LAB — BASIC METABOLIC PANEL
Anion gap: 11 (ref 5–15)
Anion gap: 12 (ref 5–15)
BUN: 20 mg/dL (ref 8–23)
BUN: 16 mg/dL (ref 8–23)
CO2: 33 mmol/L — ABNORMAL HIGH (ref 22–32)
CO2: 28 mmol/L (ref 22–32)
Calcium: 8.5 mg/dL — ABNORMAL LOW (ref 8.9–10.3)
Calcium: 7.8 mg/dL — ABNORMAL LOW (ref 8.9–10.3)
Chloride: 91 mmol/L — ABNORMAL LOW (ref 98–111)
Chloride: 93 mmol/L — ABNORMAL LOW (ref 98–111)
Creatinine, Ser: 1.42 mg/dL — ABNORMAL HIGH (ref 0.61–1.24)
Creatinine, Ser: 1.13 mg/dL (ref 0.61–1.24)
GFR, Estimated: 49 mL/min — ABNORMAL LOW (ref >60)
GFR, Estimated: >60 mL/min (ref >60)
Glucose, Bld: 153 mg/dL — ABNORMAL HIGH (ref 70–99)
Glucose, Bld: 267 mg/dL — ABNORMAL HIGH (ref 70–99)
Potassium: 2.2 mmol/L — CL (ref 3.5–5.1)
Potassium: 2.4 mmol/L — CL (ref 3.5–5.1)
Sodium: 135 mmol/L (ref 135–145)
Sodium: 133 mmol/L — ABNORMAL LOW (ref 135–145)

## 2022-12-26 LAB — I-STAT CHEM 8, ED
BUN: 14 mg/dL (ref 8–23)
Calcium, Ion: 1.05 mmol/L — ABNORMAL LOW (ref 1.15–1.40)
Chloride: 93 mmol/L — ABNORMAL LOW (ref 98–111)
Creatinine, Ser: 1.2 mg/dL (ref 0.61–1.24)
Glucose, Bld: 283 mg/dL — ABNORMAL HIGH (ref 70–99)
HCT: 38 % — ABNORMAL LOW (ref 39.0–52.0)
Hemoglobin: 12.9 g/dL — ABNORMAL LOW (ref 13.0–17.0)
Potassium: 2.6 mmol/L — CL (ref 3.5–5.1)
Sodium: 136 mmol/L (ref 135–145)
TCO2: 31 mmol/L (ref 22–32)

## 2022-12-26 LAB — COMPREHENSIVE METABOLIC PANEL
ALT: 25 U/L (ref 0–44)
AST: 32 U/L (ref 15–41)
Albumin: 3.4 g/dL — ABNORMAL LOW (ref 3.5–5.0)
Alkaline Phosphatase: 113 U/L (ref 38–126)
Anion gap: 11 (ref 5–15)
BUN: 19 mg/dL (ref 8–23)
CO2: 36 mmol/L — ABNORMAL HIGH (ref 22–32)
Calcium: 8.5 mg/dL — ABNORMAL LOW (ref 8.9–10.3)
Chloride: 88 mmol/L — ABNORMAL LOW (ref 98–111)
Creatinine, Ser: 1.33 mg/dL — ABNORMAL HIGH (ref 0.61–1.24)
GFR, Estimated: 53 mL/min — ABNORMAL LOW (ref >60)
Glucose, Bld: 151 mg/dL — ABNORMAL HIGH (ref 70–99)
Potassium: 2.1 mmol/L — CL (ref 3.5–5.1)
Sodium: 135 mmol/L (ref 135–145)
Total Bilirubin: 1.1 mg/dL (ref 0.3–1.2)
Total Protein: 7.2 g/dL (ref 6.5–8.1)

## 2022-12-26 LAB — CBC WITH DIFFERENTIAL/PLATELET
Abs Immature Granulocytes: 0.05 10*3/uL (ref 0.00–0.07)
Basophils Absolute: 0.1 10*3/uL (ref 0.0–0.1)
Basophils Relative: 1 %
Eosinophils Absolute: 0.2 10*3/uL (ref 0.0–0.5)
Eosinophils Relative: 2 %
HCT: 40.4 % (ref 39.0–52.0)
Hemoglobin: 13.5 g/dL (ref 13.0–17.0)
Immature Granulocytes: 1 %
Lymphocytes Relative: 14 %
Lymphs Abs: 1.3 10*3/uL (ref 0.7–4.0)
MCH: 30.7 pg (ref 26.0–34.0)
MCHC: 33.4 g/dL (ref 30.0–36.0)
MCV: 91.8 fL (ref 80.0–100.0)
Monocytes Absolute: 0.8 10*3/uL (ref 0.1–1.0)
Monocytes Relative: 8 %
Neutro Abs: 6.9 10*3/uL (ref 1.7–7.7)
Neutrophils Relative %: 74 %
Platelets: 289 10*3/uL (ref 150–400)
RBC: 4.4 MIL/uL (ref 4.22–5.81)
RDW: 12.5 % (ref 11.5–15.5)
WBC: 9.2 10*3/uL (ref 4.0–10.5)
nRBC: 0 % (ref 0.0–0.2)

## 2022-12-26 LAB — MAGNESIUM: Magnesium: 1.8 mg/dL (ref 1.7–2.4)

## 2022-12-26 MED ORDER — HYDROCODONE-ACETAMINOPHEN 5-325 MG PO TABS
2.0000 | ORAL_TABLET | Freq: Once | ORAL | Status: AC
  Administered 2022-12-26: 1 via ORAL
  Filled 2022-12-26: qty 2

## 2022-12-26 MED ORDER — ACETAMINOPHEN 650 MG RE SUPP: 650.0000 mg | Freq: Four times a day (QID) | RECTAL | Status: DC | PRN

## 2022-12-26 MED ORDER — GABAPENTIN 100 MG PO CAPS: 100.0000 mg | ORAL_CAPSULE | Freq: Every day | ORAL | Status: DC

## 2022-12-26 MED ORDER — TAMSULOSIN HCL 0.4 MG PO CAPS
0.4000 mg | ORAL_CAPSULE | Freq: Every day | ORAL | Status: DC
  Administered 2022-12-27: 0.4 mg via ORAL
  Filled 2022-12-26: qty 1

## 2022-12-26 MED ORDER — ONDANSETRON HCL 4 MG PO TABS: 4.0000 mg | ORAL_TABLET | Freq: Four times a day (QID) | ORAL | Status: DC | PRN

## 2022-12-26 MED ORDER — LEVOTHYROXINE SODIUM 25 MCG PO TABS
25.0000 ug | ORAL_TABLET | Freq: Every day | ORAL | Status: DC
  Administered 2022-12-27: 25 ug via ORAL
  Filled 2022-12-26: qty 1

## 2022-12-26 MED ORDER — VITAMIN B-12 1000 MCG PO TABS
2000.0000 ug | ORAL_TABLET | Freq: Every day | ORAL | Status: DC
  Administered 2022-12-27: 2000 ug via ORAL
  Filled 2022-12-26: qty 2

## 2022-12-26 MED ORDER — MORPHINE SULFATE (PF) 2 MG/ML IV SOLN: 2.0000 mg | INTRAVENOUS | Status: DC | PRN

## 2022-12-26 MED ORDER — MELATONIN 3 MG PO TABS
3.0000 mg | ORAL_TABLET | Freq: Every day | ORAL | Status: DC
  Administered 2022-12-26: 3 mg via ORAL
  Filled 2022-12-26: qty 1

## 2022-12-26 MED ORDER — PANTOPRAZOLE SODIUM 40 MG PO TBEC
40.0000 mg | DELAYED_RELEASE_TABLET | Freq: Two times a day (BID) | ORAL | Status: DC
  Administered 2022-12-27: 40 mg via ORAL
  Filled 2022-12-26: qty 1

## 2022-12-26 MED ORDER — MAGNESIUM SULFATE 2 GM/50ML IV SOLN
2.0000 g | Freq: Once | INTRAVENOUS | Status: AC
  Administered 2022-12-26: 2 g via INTRAVENOUS
  Filled 2022-12-26: qty 50

## 2022-12-26 MED ORDER — FINASTERIDE 5 MG PO TABS
5.0000 mg | ORAL_TABLET | Freq: Every day | ORAL | Status: DC
  Administered 2022-12-27: 5 mg via ORAL
  Filled 2022-12-26: qty 1

## 2022-12-26 MED ORDER — POTASSIUM CHLORIDE IN NACL 40-0.9 MEQ/L-% IV SOLN: INTRAVENOUS | Status: DC

## 2022-12-26 MED ORDER — POTASSIUM CHLORIDE CRYS ER 20 MEQ PO TBCR
40.0000 meq | EXTENDED_RELEASE_TABLET | Freq: Once | ORAL | Status: AC
  Administered 2022-12-26: 40 meq via ORAL
  Filled 2022-12-26: qty 2

## 2022-12-26 MED ORDER — GABAPENTIN 300 MG PO CAPS
600.0000 mg | ORAL_CAPSULE | Freq: Once | ORAL | Status: AC
  Administered 2022-12-26: 600 mg via ORAL
  Filled 2022-12-26: qty 2

## 2022-12-26 MED ORDER — HEPARIN SODIUM (PORCINE) 5000 UNIT/ML IJ SOLN
5000.0000 [IU] | Freq: Three times a day (TID) | INTRAMUSCULAR | Status: DC
  Filled 2022-12-26: qty 1

## 2022-12-26 MED ORDER — ONDANSETRON HCL 4 MG/2ML IJ SOLN
4.0000 mg | Freq: Four times a day (QID) | INTRAMUSCULAR | Status: DC | PRN
  Filled 2022-12-26: qty 2

## 2022-12-26 MED ORDER — POTASSIUM CHLORIDE 10 MEQ/100ML IV SOLN
10.0000 meq | INTRAVENOUS | Status: AC
  Administered 2022-12-26 (×4): 10 meq via INTRAVENOUS
  Filled 2022-12-26 (×4): qty 100

## 2022-12-26 MED ORDER — OXYCODONE HCL 5 MG PO TABS: 5.0000 mg | ORAL_TABLET | ORAL | Status: DC | PRN

## 2022-12-26 MED ORDER — SIMVASTATIN 20 MG PO TABS
20.0000 mg | ORAL_TABLET | Freq: Every evening | ORAL | Status: DC
  Administered 2022-12-26: 20 mg via ORAL
  Filled 2022-12-26: qty 1

## 2022-12-26 MED ORDER — ACETAMINOPHEN 325 MG PO TABS: 650.0000 mg | ORAL_TABLET | Freq: Four times a day (QID) | ORAL | Status: DC | PRN

## 2022-12-26 MED ORDER — ASPIRIN 81 MG PO TBEC
81.0000 mg | DELAYED_RELEASE_TABLET | Freq: Every day | ORAL | Status: DC
  Administered 2022-12-26: 81 mg via ORAL
  Filled 2022-12-26: qty 1

## 2022-12-26 NOTE — Assessment & Plan Note (Addendum)
-  Holding hydrochlorothiazide in the setting of hypoK

## 2022-12-26 NOTE — ED Provider Notes (Signed)
[facility] EMERGENCY DEPARTMENT AT [facility] Provider Note   CSN: [id number] Arrival date & time: 12/26/22  1145     History  Chief Complaint  Patient presents with   Abnormal Lab    Calvin Moreno is a 82 y.o. male.  Patient to the emergency department by his GI physician when his preop labs revealed that he had a potassium of 2.2.  Patient is scheduled to have a endoscopy by Dr. Jena Gauss on August 7.  Patient went to have labs drawn and was told he should come to the emergency department for treatment.  Patient denies any weakness he denies any chest pain patient denies any nausea or vomiting.  He is taking hydrochlorothiazide.  Patient has past medical history of hypertension left bundle branch block aortic stenosis.  He reports he is having a endoscopy because he has been having reflux and is comfort.  The history is provided by the patient. No language interpreter was used.  Abnormal Lab Time since result:  K 2.2 Patient referred by:  Specialist Resulting agency:  Internal Result type: chemistry        Home Medications Prior to Admission medications   Medication Sig Start Date End Date Taking? Authorizing Provider  pantoprazole (PROTONIX) 40 MG tablet Take 1 tablet (40 mg total) by mouth 2 (two) times daily before a meal. 11/28/22   Rourk, Gerrit Friends, MD  acyclovir (ZOVIRAX) 800 MG tablet Take 800 mg by mouth See admin instructions. Take 800 mg daily, increase to 800 mg 3 times daily as needed when having a fever blister flare    [provider]  alfuzosin (UROXATRAL) 10 MG 24 hr tablet Take 10 mg by mouth at bedtime. 10/28/22   [provider]  ALPRAZolam Prudy Feeler) 0.5 MG tablet Take 0.5 mg by mouth at bedtime as needed for sleep.    [provider]  Ascorbic Acid (VITAMIN C) 1000 MG tablet Take 1,000 mg by mouth daily.    [provider]  aspirin EC 81 MG tablet Take 81 mg by mouth at bedtime.    [provider]  fexofenadine  (ALLEGRA) 180 MG tablet Take 180 mg by mouth daily as needed for allergies.    [provider]  finasteride (PROSCAR) 5 MG tablet Take 1 tablet (5 mg total) by mouth daily. 08/13/22   McKenzie, Mardene Celeste, MD  furosemide (LASIX) 40 MG tablet Take 40 mg by mouth daily. 12/10/21   [provider]  gabapentin (NEURONTIN) 100 MG capsule Take by mouth. 09/09/22   [provider]  hydrochlorothiazide (HYDRODIURIL) 12.5 MG tablet Take 12.5 mg by mouth daily. 01/03/20   [provider]  HYDROcodone-acetaminophen (NORCO/VICODIN) 5-325 MG tablet Take 1 tablet by mouth every 6 (six) hours as needed for moderate pain.    [provider]  levothyroxine (SYNTHROID) 25 MCG tablet Take 25 mcg by mouth daily. 10/24/22   [provider]  meclizine (ANTIVERT) 25 MG tablet Take 25 mg by mouth at bedtime.    [provider]  Melatonin 3 MG TABS Take 3 mg by mouth at bedtime. For sleep    [provider]  Polyethyl Glycol-Propyl Glycol (SYSTANE OP) Place 1 drop into both eyes 2 (two) times daily.    [provider]  silodosin (RAPAFLO) 8 MG CAPS capsule Take 1 capsule (8 mg total) by mouth at bedtime. 08/14/22   McKenzie, Mardene Celeste, MD  simvastatin (ZOCOR) 20 MG tablet Take 20 mg by mouth every  evening.    [provider]  tamsulosin (FLOMAX) 0.4 MG CAPS capsule Take 0.4 mg by mouth daily. 11/14/22   [provider]  vitamin B-12 (CYANOCOBALAMIN) 1000 MCG tablet Take 2,000 mcg by mouth daily.    [provider]      Allergies    Norvasc [amlodipine besylate], Codeine, and Amoxicillin    Review of Systems   Review of Systems  All other systems reviewed and are negative.   Physical Exam Updated Vital Signs BP (!) 134/91 (BP Location: Right Arm)   Pulse (!) 116   Temp 98.4 F (36.9 C)   Resp 18   Ht 5\' 5"  (1.651 m)   Wt 77.1 kg   SpO2 96%   BMI 28.29 kg/m  Physical Exam Vitals and nursing note reviewed.   Constitutional:      Appearance: He is well-developed.  HENT:     Head: Normocephalic.  Cardiovascular:     Rate and Rhythm: Normal rate.  Pulmonary:     Effort: Pulmonary effort is normal.  Abdominal:     General: There is no distension.  Musculoskeletal:        General: Normal range of motion.     Cervical back: Normal range of motion.  Skin:    General: Skin is warm.  Neurological:     General: No focal deficit present.     Mental Status: He is alert and oriented to person, place, and time.     ED Results / Procedures / Treatments   Labs (all labs ordered are listed, but only abnormal results are displayed) Labs Reviewed  COMPREHENSIVE METABOLIC PANEL - Abnormal; Notable for the following components:      Result Value   Potassium 2.1 (*)    Chloride 88 (*)    CO2 36 (*)    Glucose, Bld 151 (*)    Creatinine, Ser 1.33 (*)    Calcium 8.5 (*)    Albumin 3.4 (*)    GFR, Estimated 53 (*)    All other components within normal limits  BASIC METABOLIC PANEL - Abnormal; Notable for the following components:   Sodium 133 (*)    Potassium 2.4 (*)    Chloride 93 (*)    Glucose, Bld 267 (*)    Calcium 7.8 (*)    All other components within normal limits  CBC WITH DIFFERENTIAL/PLATELET  MAGNESIUM    EKG EKG Interpretation Date/Time:  Friday December 26 2022 12:20:54 EDT Ventricular Rate:  95 PR Interval:  198 QRS Duration:  148 QT Interval:  434 QTC Calculation: 545 R Axis:   35  Text Interpretation: Normal sinus rhythm Left bundle branch block Abnormal ECG When compared with ECG of 26-Dec-2022 10:26, No significant change was found Confirmed by Meridee Score [phone]) on 12/26/2022 12:32:30 PM  Radiology No results found.  Procedures Procedures    Medications Ordered in ED Medications  gabapentin (NEURONTIN) capsule 600 mg (has no administration in time range)  HYDROcodone-acetaminophen (NORCO/VICODIN) 5-325 MG per tablet 2 tablet (has no administration in  time range)  potassium chloride 10 mEq in 100 mL IVPB (0 mEq Intravenous Stopped 12/26/22 1855)  potassium chloride SA (KLOR-CON M) CR tablet 40 mEq (40 mEq Oral Given 12/26/22 1350)  magnesium sulfate IVPB 2 g 50 mL (0 g Intravenous Stopped 12/26/22 1800)    ED Course/ Medical Decision Making/ A&P  Medical Decision Making Patient sent here to the emergency department due to a low potassium  Amount and/or Complexity of Data Reviewed External Data Reviewed: labs.    Details: Gwyndolyn Kaufman evaluation earlier today showed a potassium of 2.2 Labs: ordered.    Details: Labs ordered reviewed and interpreted potassium is 2.1.  Chloride is 88 Discussion of management or test interpretation with external provider(s): Hospitalist consulted for admission  Risk Prescription drug management. Decision regarding hospitalization. Risk Details: Patient given K-Dur 40 mEq.  Patient is given potassium x 3 rounds.  Patient does not want to be admitted to the hospital.  I will recheck potassium after 3 rounds of potassium and it is 2.4.  I discussed findings with patient he is uncomfortable staying in the hospital due to his neuropathy.  I we will dose his gabapentin and hydrocodone that he takes at bedtime to try to help with his symptoms and continue potassium.           Final Clinical Impression(s) / ED Diagnoses Final diagnoses:  Hypokalemia  Neuropathy    Rx / DC Orders ED Discharge Orders     None         Osie Cheeks 12/26/22 2336    Terrilee Files, MD 12/27/22 1118

## 2022-12-26 NOTE — ED Notes (Addendum)
Updated pt's daughter on plan of care; daughter requesting medication list to be evaluated during hospital stay due to the amount of pain medications and sedatives

## 2022-12-26 NOTE — Assessment & Plan Note (Addendum)
-  Potassium 2.2 -After K+ 2.4 (short interval recheck) -Continue to replete and monitor

## 2022-12-26 NOTE — Assessment & Plan Note (Signed)
-   2/2 hypoK -EKG shows Qtc 545 -monitor on tele -monitor and replete electrolytes as indicated

## 2022-12-26 NOTE — H&P (Signed)
History and Physical    Patient: Calvin Moreno HKV:[id number] DOB: 08-May-1941 DOA: 12/26/2022 DOS: the patient was seen and examined on 12/26/2022 PCP: Benita Stabile, MD  Patient coming from: Home  Chief Complaint:  Chief Complaint  Patient presents with   Abnormal Lab   HPI: Calvin Moreno is a 82 y.o. male with medical history significant of GERD, hearing impairment, HTN, HLD, hypothyroidism, history of stroke, neuropathy, murmur, and more presents to the ED at the suggestion of pre-op clinic. Patient had pre-op labs done today for EGD on 12/31/22. He was called and told to come to ER for hypokalemia. Patient denies chest pain, palpitations, muscle twitching/cramping, weakness. He does report loose stools, but that has been going on for 2 years. He reports he will have up to 3 loose stools in a day, and then not have any BM for a couple of days. He denies hematochezia,  melena. Patient has no other acute complaints at this time.   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 He takes gabapentin at home. Patient is very concerned about returning home as early as possible due to his wife needing his help.    Review of Systems: As mentioned in the history of present illness. All other systems reviewed and are negative. Past Medical History:  Diagnosis Date   Aortic stenosis, mod 2021   And insufficiency, evaluation by Dr. Daleen Squibb in 2000   Arthritis    Benign prostatic hypertrophy    PSA of 1.39 in 09/2010   Chronic kidney disease    Borderline; creatinine of 1.6 in 08/2009, but 1.15 in 05/2010   Chronic sinusitis    By MRI   Diverticulosis 2013   GERD (gastroesophageal reflux disease)    Hearing impaired    Hemorrhoids 2006   Hiatal hernia 2006   HOH (hard of hearing)    Hyperlipidemia    Elevated triglycerides   Hypertension    Mild internal carotid artery plaque on MRI/MRA in 2002   Hypothyroidism    IBS (irritable bowel syndrome)    Insomnia    Left bundle branch block 2012    2012   Metabolic syndrome    Fasting hyperglycemia   Murmur    Nephrolithiasis 08/2009   2011   Obesity    Proctitis    radiation induced   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   s/p XRT   Seasonal allergies    Stroke (HCC)    TIA, no residual, seen on MRI   Tobacco abuse, in remission    60 pack years; quit in 1999   Uses hearing aid    bilateral   Past Surgical History:  Procedure Laterality Date   ANKLE FRACTURE SURGERY     left   CATARACT EXTRACTION Left    CATARACT EXTRACTION W/PHACO Right 12/19/2013   Procedure: CATARACT EXTRACTION PHACO AND INTRAOCULAR LENS PLACEMENT RIGHT EYE CDE=14.78;  Surgeon: Gemma Payor, MD;  Location: [facility];  Service: Ophthalmology;  Laterality: Right;   CHOLECYSTECTOMY     COLONOSCOPY  2006   Dr. Henry Russel mucosa throughout colon, moderate internal hemorrhoids. bx= benign colonic mucosa   COLONOSCOPY  12/03/2011   Dr. Jena Gauss- colonic diverticulosis, radiation-induced proctitis- s/p APC ablation, no microscopic colitis on bx   COLONOSCOPY WITH PROPOFOL N/A 06/12/2020   Procedure: COLONOSCOPY WITH PROPOFOL;  Surgeon: Corbin Ade, MD;  Location: [facility];  Service: Endoscopy;  Laterality: N/A;  2:45pm   ESOPHAGOGASTRODUODENOSCOPY  2006   Dr. Randa Evens- small hiatal hernia  and slightly reddened distal esophagus o/w normal   FLEXIBLE SIGMOIDOSCOPY N/A 07/13/2013   OZD:[id number] changes of the rectum with active oozing consistent with chronic radiation proctitis.  Otherwise, negative sigmoidoscopy to 40 cm.Status post argon plasma coagulation ablation    HOT HEMOSTASIS N/A 07/13/2013   Procedure: HOT HEMOSTASIS (ARGON PLASMA COAGULATION/BICAP);  Surgeon: Corbin Ade, MD;  Location: [facility];  Service: Endoscopy;  Laterality: N/A;   Ileocolonoscopy  12/03/2011   ZDG:LOVFIEP diverticulosis. Radiation-induced proctitis status post APC ablation. Status post segmental colon Biopsy   TONSILLECTOMY     Social History:  reports that he quit  smoking about 25 years ago. His smoking use included cigarettes. He started smoking about 65 years ago. He has a 60 pack-year smoking history. He has never been exposed to tobacco smoke. He has never used smokeless tobacco. He reports that he does not drink alcohol and does not use drugs.  Allergies  Allergen Reactions   Norvasc [Amlodipine Besylate] Swelling    Ankle swelling   Codeine Nausea And Vomiting   Amoxicillin Rash    Family History  Problem Relation Age of Onset   Heart failure Mother    Heart attack Father    Heart failure Brother        bladder cancer (agent orange exposure)   Cirrhosis Brother        chronic viral hepatitis   Colon cancer Neg Hx     Prior to Admission medications   Medication Sig Start Date End Date Taking? Authorizing Provider  pantoprazole (PROTONIX) 40 MG tablet Take 1 tablet (40 mg total) by mouth 2 (two) times daily before a meal. 11/28/22   Rourk, Gerrit Friends, MD  acyclovir (ZOVIRAX) 800 MG tablet Take 800 mg by mouth See admin instructions. Take 800 mg daily, increase to 800 mg 3 times daily as needed when having a fever blister flare    [provider]  alfuzosin (UROXATRAL) 10 MG 24 hr tablet Take 10 mg by mouth at bedtime. 10/28/22   [provider]  ALPRAZolam Prudy Feeler) 0.5 MG tablet Take 0.5 mg by mouth at bedtime as needed for sleep.    [provider]  Ascorbic Acid (VITAMIN C) 1000 MG tablet Take 1,000 mg by mouth daily.    [provider]  aspirin EC 81 MG tablet Take 81 mg by mouth at bedtime.    [provider]  fexofenadine (ALLEGRA) 180 MG tablet Take 180 mg by mouth daily as needed for allergies.    [provider]  finasteride (PROSCAR) 5 MG tablet Take 1 tablet (5 mg total) by mouth daily. 08/13/22   McKenzie, Mardene Celeste, MD  furosemide (LASIX) 40 MG tablet Take 40 mg by mouth daily. 12/10/21   [provider]  gabapentin (NEURONTIN) 100 MG capsule Take by mouth. 09/09/22    [provider]  hydrochlorothiazide (HYDRODIURIL) 12.5 MG tablet Take 12.5 mg by mouth daily. 01/03/20   [provider]  HYDROcodone-acetaminophen (NORCO/VICODIN) 5-325 MG tablet Take 1 tablet by mouth every 6 (six) hours as needed for moderate pain.    [provider]  levothyroxine (SYNTHROID) 25 MCG tablet Take 25 mcg by mouth daily. 10/24/22   [provider]  meclizine (ANTIVERT) 25 MG tablet Take 25 mg by mouth at bedtime.    [provider]  Melatonin 3 MG TABS Take 3 mg by mouth at bedtime. For sleep    [provider]  Polyethyl Glycol-Propyl Glycol (SYSTANE OP) Place 1  drop into both eyes 2 (two) times daily.    [provider]  silodosin (RAPAFLO) 8 MG CAPS capsule Take 1 capsule (8 mg total) by mouth at bedtime. 08/14/22   McKenzie, Mardene Celeste, MD  simvastatin (ZOCOR) 20 MG tablet Take 20 mg by mouth every evening.    [provider]  tamsulosin (FLOMAX) 0.4 MG CAPS capsule Take 0.4 mg by mouth daily. 11/14/22   [provider]  vitamin B-12 (CYANOCOBALAMIN) 1000 MCG tablet Take 2,000 mcg by mouth daily.    [provider]    Physical Exam: Vitals:   12/26/22 1216 12/26/22 1545 12/26/22 1639 12/26/22 1845  BP:  (!) 133/95  (!) 134/91  Pulse:  88  (!) 116  Resp:  15  18  Temp:   98.4 F (36.9 C) 98.4 F (36.9 C)  TempSrc:   Oral   SpO2:  97%  96%  Weight: 77.1 kg     Height: 5\' 5"  (1.651 m)      1.  General: Patient lying supine in bed,  no acute distress   2. Psychiatric: Alert and oriented x 3, mood and behavior normal for situation, pleasant and cooperative with exam   3. Neurologic: Speech and language are normal, face is symmetric, moves all 4 extremities voluntarily, at baseline without acute deficits on limited exam   4. HEENMT:  Head is atraumatic, normocephalic, pupils reactive to light, neck is supple, trachea is midline, mucous membranes are moist   5. Respiratory  : Lungs are clear to auscultation bilaterally without wheezing, rhonchi, rales, no cyanosis, no increase in work of breathing or accessory muscle use   6. Cardiovascular : Heart rate normal, rhythm is regular, murmur present, no rubs or gallops, no peripheral edema, peripheral pulses palpated   7. Gastrointestinal:  Abdomen is soft, nondistended, nontender to palpation bowel sounds active, no masses or organomegaly palpated   8. Skin:  Skin is warm, dry and intact without rashes, acute lesions, or ulcers on limited exam   9.Musculoskeletal:  No acute deformities or trauma, no asymmetry in tone, no peripheral edema, peripheral pulses palpated, no tenderness to palpation in the extremities  Data Reviewed: Labs, EKG, and treatments from ED reviewed.   Assessment and Plan: * Hypokalemia -Potassium 2.2 -After K+ 2.4 (short interval recheck) -Continue to replete and monitor  QT prolongation - 2/2 hypoK -EKG shows Qtc 545 -monitor on tele -monitor and replete electrolytes as indicated  GERD (gastroesophageal reflux disease) -Continue protonix -Supposed to get EGD on 12/31/22  Hypertension -Holding hydrochlorothiazide in the setting of hypoK  Hyperlipidemia -Continue statin      Advance Care Planning:   Code Status: Full Code   Consults: none at this time  Family Communication: No family at bedside  Severity of Illness: The appropriate patient status for this patient is OBSERVATION. Observation status is judged to be reasonable and necessary in order to provide the required intensity of service to ensure the patient's safety. The patient's presenting symptoms, physical exam findings, and initial radiographic and laboratory data in the context of their medical condition is felt to place them at decreased risk for further clinical deterioration. Furthermore, it is anticipated that the patient will be medically stable for discharge from the hospital within 2 midnights of  admission.   Author: Lilyan Gilford, DO 12/26/2022 8:41 PM  For on call review [email or web address].

## 2022-12-26 NOTE — Assessment & Plan Note (Signed)
Continue statin. 

## 2022-12-26 NOTE — ED Triage Notes (Addendum)
Pt went to have pre-op for endoscope today. At pre-op it was cancelled due to Potassium of 2.2.  Told to come to ED   Denies CP and SOB Denies weakness.

## 2022-12-26 NOTE — Assessment & Plan Note (Addendum)
-  Continue protonix -Supposed to get EGD on 12/31/22

## 2022-12-27 DIAGNOSIS — K219 Gastro-esophageal reflux disease without esophagitis: Secondary | ICD-10-CM | POA: Diagnosis not present

## 2022-12-27 DIAGNOSIS — E876 Hypokalemia: Secondary | ICD-10-CM | POA: Diagnosis not present

## 2022-12-27 LAB — BASIC METABOLIC PANEL
Anion gap: 9 (ref 5–15)
BUN: 11 mg/dL (ref 8–23)
CO2: 28 mmol/L (ref 22–32)
Calcium: 8.1 mg/dL — ABNORMAL LOW (ref 8.9–10.3)
Chloride: 99 mmol/L (ref 98–111)
Creatinine, Ser: 1 mg/dL (ref 0.61–1.24)
GFR, Estimated: >60 mL/min (ref >60)
Glucose, Bld: 213 mg/dL — ABNORMAL HIGH (ref 70–99)
Potassium: 3.4 mmol/L — ABNORMAL LOW (ref 3.5–5.1)
Sodium: 136 mmol/L (ref 135–145)

## 2022-12-27 MED ORDER — POTASSIUM CHLORIDE CRYS ER 10 MEQ PO TBCR: 10.0000 meq | EXTENDED_RELEASE_TABLET | Freq: Every day | ORAL | 0 refills | Status: DC

## 2022-12-27 MED ORDER — POTASSIUM CHLORIDE CRYS ER 20 MEQ PO TBCR
40.0000 meq | EXTENDED_RELEASE_TABLET | Freq: Once | ORAL | Status: AC
  Administered 2022-12-27: 40 meq via ORAL
  Filled 2022-12-27: qty 2

## 2022-12-27 NOTE — Care Management Obs Status (Signed)
MEDICARE OBSERVATION STATUS NOTIFICATION   Patient Details  Name: DESMOND SZABO MRN: [id number] Date of Birth: Jul 16, 1940   Medicare Observation Status Notification Given:  Yes     Marsh Dolly, LCSW 12/27/2022, 10:40 AM

## 2022-12-27 NOTE — Progress Notes (Signed)
   12/27/22 1037  TOC Brief Assessment  Insurance and Status Reviewed  Patient has primary care physician Yes  Home environment has been reviewed From Home  Prior level of function: Indepenent  Prior/Current Home Services No current home services  Social Determinants of Health Reivew SDOH reviewed no interventions necessary (Pt smoker)  Readmission risk has been reviewed Yes (No score)  Transition of care needs no transition of care needs at this time   CSW visited pt at bedside to complete MOON.

## 2022-12-27 NOTE — Discharge Instructions (Signed)

## 2022-12-27 NOTE — Plan of Care (Signed)
  Problem: Education: Goal: Knowledge of General Education information will improve Description: Including pain rating scale, medication(s)/side effects and non-pharmacologic comfort measures 12/27/2022 1250 by Sheela Stack, RN Outcome: Adequate for Discharge 12/27/2022 1250 by Sheela Stack, RN Outcome: Adequate for Discharge   Problem: Health Behavior/Discharge Planning: Goal: Ability to manage health-related needs will improve 12/27/2022 1250 by Sheela Stack, RN Outcome: Adequate for Discharge 12/27/2022 1250 by Sheela Stack, RN Outcome: Adequate for Discharge   Problem: Clinical Measurements: Goal: Ability to maintain clinical measurements within normal limits will improve 12/27/2022 1250 by Sheela Stack, RN Outcome: Adequate for Discharge 12/27/2022 1250 by Sheela Stack, RN Outcome: Adequate for Discharge Goal: Will remain free from infection 12/27/2022 1250 by Sheela Stack, RN Outcome: Adequate for Discharge 12/27/2022 1250 by Sheela Stack, RN Outcome: Adequate for Discharge Goal: Diagnostic test results will improve 12/27/2022 1250 by Sheela Stack, RN Outcome: Adequate for Discharge 12/27/2022 1250 by Sheela Stack, RN Outcome: Adequate for Discharge Goal: Respiratory complications will improve 12/27/2022 1250 by Sheela Stack, RN Outcome: Adequate for Discharge 12/27/2022 1250 by Sheela Stack, RN Outcome: Adequate for Discharge Goal: Cardiovascular complication will be avoided 12/27/2022 1250 by Sheela Stack, RN Outcome: Adequate for Discharge 12/27/2022 1250 by Sheela Stack, RN Outcome: Adequate for Discharge   Problem: Activity: Goal: Risk for activity intolerance will decrease 12/27/2022 1250 by Sheela Stack, RN Outcome: Adequate for Discharge 12/27/2022 1250 by Sheela Stack, RN Outcome: Adequate for Discharge   Problem: Nutrition: Goal: Adequate nutrition will be maintained 12/27/2022 1250 by Sheela Stack, RN Outcome: Adequate for  Discharge 12/27/2022 1250 by Sheela Stack, RN Outcome: Adequate for Discharge   Problem: Coping: Goal: Level of anxiety will decrease 12/27/2022 1250 by Sheela Stack, RN Outcome: Adequate for Discharge 12/27/2022 1250 by Sheela Stack, RN Outcome: Adequate for Discharge   Problem: Elimination: Goal: Will not experience complications related to bowel motility 12/27/2022 1250 by Sheela Stack, RN Outcome: Adequate for Discharge 12/27/2022 1250 by Sheela Stack, RN Outcome: Adequate for Discharge Goal: Will not experience complications related to urinary retention 12/27/2022 1250 by Sheela Stack, RN Outcome: Adequate for Discharge 12/27/2022 1250 by Sheela Stack, RN Outcome: Adequate for Discharge   Problem: Pain Managment: Goal: General experience of comfort will improve Outcome: Adequate for Discharge   Problem: Safety: Goal: Ability to remain free from injury will improve Outcome: Adequate for Discharge   Problem: Skin Integrity: Goal: Risk for impaired skin integrity will decrease Outcome: Adequate for Discharge

## 2022-12-27 NOTE — Discharge Summary (Signed)
Physician Discharge Summary  Calvin Moreno JXB:[id number] DOB: Jun 13, 1940 DOA: 12/26/2022  PCP: Benita Stabile, MD  Admit date: 12/26/2022 Discharge date: 12/27/2022  Admitted From:  Home  Disposition: Home   Recommendations for Outpatient Follow-up:  Follow up with PCP in 1 weeks Please recheck BMP in 1 week   Discharge Condition: STABLE   CODE STATUS: FULL DIET: resume previous home diet    Brief Hospitalization Summary: Please see all hospital notes, images, labs for full details of the hospitalization. ADMISSION PROVIDER HPI:  82 y.o. male with medical history significant of GERD, hearing impairment, HTN, HLD, hypothyroidism, history of stroke, neuropathy, murmur, and more presents to the ED at the suggestion of pre-op clinic. Patient had pre-op labs done today for EGD on 12/31/22. He was called and told to come to ER for hypokalemia. Patient denies chest pain, palpitations, muscle twitching/cramping, weakness. He does report loose stools, but that has been going on for 2 years. He reports he will have up to 3 loose stools in a day, and then not have any BM for a couple of days. He denies hematochezia,  melena. Patient has no other acute complaints at this time.    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 He takes gabapentin at home. Patient is very concerned about returning home as early as possible due to his wife needing his help.    Hospital Course Pt was admitted for potassium correction and dehydration.  He reported that he had been taking HCTZ and furosemide at the same time.  He was not taking a potassium supplement.  He reports that he was prescribed these by 2 separate providers.  Was given IV fluid with potassium supplement and oral potassium supplement and his potassium has corrected to 3.4.  He will be taken off of HCTZ at this time.  He will be given a daily potassium supplement to take with his daily furosemide tablet.  He was strongly advised to see his PCP in about a week so  that he can have repeat BMP tested and to review medications with PCP.  He verbalized understanding.  Patient is feeling much better today eating and drinking well and stable for discharge home.  Discharge Diagnoses:  Principal Problem:   Hypokalemia Active Problems:   Hyperlipidemia   Hypertension   GERD (gastroesophageal reflux disease)   QT prolongation   Discharge Instructions:  Allergies as of 12/27/2022       Reactions   Norvasc [amlodipine Besylate] Swelling   Ankle swelling   Codeine Nausea And Vomiting   Amoxicillin Rash        Medication List     STOP taking these medications    alfuzosin 10 MG 24 hr tablet Commonly known as: UROXATRAL   fexofenadine 180 MG tablet Commonly known as: ALLEGRA   hydrochlorothiazide 12.5 MG tablet Commonly known as: HYDRODIURIL   meclizine 25 MG tablet Commonly known as: ANTIVERT   tamsulosin 0.4 MG Caps capsule Commonly known as: FLOMAX       TAKE these medications    acyclovir 800 MG tablet Commonly known as: ZOVIRAX Take 800 mg by mouth See admin instructions. Take 800 mg daily, increase to 800 mg 3 times daily as needed when having a fever blister flare   ALPRAZolam 0.5 MG tablet Commonly known as: XANAX Take 0.5 mg by mouth at bedtime as needed for sleep.   aspirin EC 81 MG tablet Take 81 mg by mouth at bedtime.   cyanocobalamin 1000 MCG tablet Commonly  known as: VITAMIN B12 Take 2,000 mcg by mouth daily.   finasteride 5 MG tablet Commonly known as: PROSCAR Take 1 tablet (5 mg total) by mouth daily.   fluorometholone 0.1 % ophthalmic suspension Commonly known as: FML SMARTSIG:In Eye(s)   fluticasone 0.05 % cream Commonly known as: CUTIVATE SMARTSIG:Topical 1-2 Times Daily PRN   furosemide 40 MG tablet Commonly known as: LASIX Take 40 mg by mouth daily.   gabapentin 100 MG capsule Commonly known as: NEURONTIN Take by mouth.   HYDROcodone-acetaminophen 5-325 MG tablet Commonly known as:  NORCO/VICODIN Take 1 tablet by mouth every 6 (six) hours as needed for moderate pain.   HYDROcodone-acetaminophen 10-325 MG tablet Commonly known as: NORCO Take 1 tablet by mouth 4 (four) times daily as needed.   levothyroxine 25 MCG tablet Commonly known as: SYNTHROID Take 25 mcg by mouth daily.   melatonin 3 MG Tabs tablet Take 3 mg by mouth at bedtime. For sleep   pantoprazole 40 MG tablet Commonly known as: PROTONIX Take 1 tablet (40 mg total) by mouth 2 (two) times daily before a meal.   potassium chloride 10 MEQ tablet Commonly known as: KLOR-CON M Take 1 tablet (10 mEq total) by mouth daily.   silodosin 8 MG Caps capsule Commonly known as: RAPAFLO Take 1 capsule (8 mg total) by mouth at bedtime.   simvastatin 20 MG tablet Commonly known as: ZOCOR Take 20 mg by mouth every evening.   SYSTANE OP Place 1 drop into both eyes 2 (two) times daily as needed (dry eyes).   vitamin C 1000 MG tablet Take 1,000 mg by mouth daily.        Follow-up Information     Benita Stabile, MD. Schedule an appointment as soon as possible for a visit in 1 week(s).   Specialty: Internal Medicine Why: Hospital Follow Up Contact information: [address] [place] [facility] [address] [phone]                Allergies  Allergen Reactions   Norvasc [Amlodipine Besylate] Swelling    Ankle swelling   Codeine Nausea And Vomiting   Amoxicillin Rash   Allergies as of 12/27/2022       Reactions   Norvasc [amlodipine Besylate] Swelling   Ankle swelling   Codeine Nausea And Vomiting   Amoxicillin Rash        Medication List     STOP taking these medications    alfuzosin 10 MG 24 hr tablet Commonly known as: UROXATRAL   fexofenadine 180 MG tablet Commonly known as: ALLEGRA   hydrochlorothiazide 12.5 MG tablet Commonly known as: HYDRODIURIL   meclizine 25 MG tablet Commonly known as: ANTIVERT   tamsulosin 0.4 MG Caps capsule Commonly known as: FLOMAX        TAKE these medications    acyclovir 800 MG tablet Commonly known as: ZOVIRAX Take 800 mg by mouth See admin instructions. Take 800 mg daily, increase to 800 mg 3 times daily as needed when having a fever blister flare   ALPRAZolam 0.5 MG tablet Commonly known as: XANAX Take 0.5 mg by mouth at bedtime as needed for sleep.   aspirin EC 81 MG tablet Take 81 mg by mouth at bedtime.   cyanocobalamin 1000 MCG tablet Commonly known as: VITAMIN B12 Take 2,000 mcg by mouth daily.   finasteride 5 MG tablet Commonly known as: PROSCAR Take 1 tablet (5 mg total) by mouth daily.   fluorometholone 0.1 % ophthalmic suspension Commonly  known as: FML SMARTSIG:In Eye(s)   fluticasone 0.05 % cream Commonly known as: CUTIVATE SMARTSIG:Topical 1-2 Times Daily PRN   furosemide 40 MG tablet Commonly known as: LASIX Take 40 mg by mouth daily.   gabapentin 100 MG capsule Commonly known as: NEURONTIN Take by mouth.   HYDROcodone-acetaminophen 5-325 MG tablet Commonly known as: NORCO/VICODIN Take 1 tablet by mouth every 6 (six) hours as needed for moderate pain.   HYDROcodone-acetaminophen 10-325 MG tablet Commonly known as: NORCO Take 1 tablet by mouth 4 (four) times daily as needed.   levothyroxine 25 MCG tablet Commonly known as: SYNTHROID Take 25 mcg by mouth daily.   melatonin 3 MG Tabs tablet Take 3 mg by mouth at bedtime. For sleep   pantoprazole 40 MG tablet Commonly known as: PROTONIX Take 1 tablet (40 mg total) by mouth 2 (two) times daily before a meal.   potassium chloride 10 MEQ tablet Commonly known as: KLOR-CON M Take 1 tablet (10 mEq total) by mouth daily.   silodosin 8 MG Caps capsule Commonly known as: RAPAFLO Take 1 capsule (8 mg total) by mouth at bedtime.   simvastatin 20 MG tablet Commonly known as: ZOCOR Take 20 mg by mouth every evening.   SYSTANE OP Place 1 drop into both eyes 2 (two) times daily as needed (dry eyes).   vitamin C 1000 MG  tablet Take 1,000 mg by mouth daily.        Procedures/Studies: No results found.   Subjective: Pt reports he is feeling better after receiving IV fluids, he is eating and drinking better.   Discharge Exam: Vitals:   12/27/22 0228 12/27/22 0523  BP: 111/78 107/74  Pulse: 86 84  Resp: 17 17  Temp: 97.7 F (36.5 C) 98 F (36.7 C)  SpO2: 96% 99%   Vitals:   12/26/22 2057 12/26/22 2118 12/27/22 0228 12/27/22 0523  BP: 123/76 (!) 141/68 111/78 107/74  Pulse: (!) 110  86 84  Resp: 20 19 17 17   Temp: (!) 97.5 F (36.4 C) 98.4 F (36.9 C) 97.7 F (36.5 C) 98 F (36.7 C)  TempSrc:  Oral    SpO2: 97% 99% 96% 99%  Weight:  74.7 kg    Height:       General: Pt is alert, awake, not in acute distress Cardiovascular: normal S1/S2 +, no rubs, no gallops Respiratory: CTA bilaterally, no wheezing, no rhonchi Abdominal: Soft, NT, ND, bowel sounds + Extremities: no edema, no cyanosis   The results of significant diagnostics from this hospitalization (including imaging, microbiology, ancillary and laboratory) are listed below for reference.     Microbiology: No results found for this or any previous visit (from the past 240 hour(s)).   Labs: BNP (last 3 results) No results for input(s): "BNP" in the last 8760 hours. Basic Metabolic Panel: Recent Labs  Lab 12/26/22 1029 12/26/22 1232 12/26/22 1803 12/26/22 2032 12/27/22 0359 12/27/22 0959  NA 135 135 133* 136 139 136  K 2.2* 2.1* 2.4* 2.6* 2.9* 3.4*  CL 91* 88* 93* 93* 98 99  CO2 33* 36* 28  --  35* 28  GLUCOSE 153* 151* 267* 283* 137* 213*  BUN 20 19 16 14 11 11   CREATININE 1.42* 1.33* 1.13 1.20 1.01 1.00  CALCIUM 8.5* 8.5* 7.8*  --  8.3* 8.1*  MG  --  1.8  --   --  2.1  --    Liver Function Tests: Recent Labs  Lab 12/26/22 1232 12/27/22 0359  AST 32  24  ALT 25 20  ALKPHOS 113 94  BILITOT 1.1 1.4*  PROT 7.2 6.3*  ALBUMIN 3.4* 2.9*   No results for input(s): "LIPASE", "AMYLASE" in the last 168 hours. No  results for input(s): "AMMONIA" in the last 168 hours. CBC: Recent Labs  Lab 12/26/22 1232 12/26/22 2032 12/27/22 0359  WBC 9.2  --  11.9*  NEUTROABS 6.9  --  9.6*  HGB 13.5 12.9* 12.6*  HCT 40.4 38.0* 38.2*  MCV 91.8  --  92.5  PLT 289  --  259   Cardiac Enzymes: No results for input(s): "CKTOTAL", "CKMB", "CKMBINDEX", "TROPONINI" in the last 168 hours. BNP: Invalid input(s): "POCBNP" CBG: No results for input(s): "GLUCAP" in the last 168 hours. D-Dimer No results for input(s): "DDIMER" in the last 72 hours. Hgb A1c No results for input(s): "HGBA1C" in the last 72 hours. Lipid Profile No results for input(s): "CHOL", "HDL", "LDLCALC", "TRIG", "CHOLHDL", "LDLDIRECT" in the last 72 hours. Thyroid function studies Recent Labs    12/27/22 0359  TSH 1.911   Anemia work up No results for input(s): "VITAMINB12", "FOLATE", "FERRITIN", "TIBC", "IRON", "RETICCTPCT" in the last 72 hours. Urinalysis    Component Value Date/Time   COLORURINE YELLOW 06/17/2018 0626   APPEARANCEUR Clear 08/13/2022 1502   LABSPEC 1.023 06/17/2018 0626   PHURINE 5.0 06/17/2018 0626   GLUCOSEU Negative 08/13/2022 1502   HGBUR SMALL (A) 06/17/2018 0626   BILIRUBINUR Negative 08/13/2022 1502   KETONESUR NEGATIVE 06/17/2018 0626   PROTEINUR Negative 08/13/2022 1502   PROTEINUR NEGATIVE 06/17/2018 0626   UROBILINOGEN negative (A) 10/19/2019 1444   UROBILINOGEN 0.2 08/26/2009 0930   NITRITE Negative 08/13/2022 1502   NITRITE NEGATIVE 06/17/2018 0626   LEUKOCYTESUR Negative 08/13/2022 1502   Sepsis Labs Recent Labs  Lab 12/26/22 1232 12/27/22 0359  WBC 9.2 11.9*   Microbiology No results found for this or any previous visit (from the past 240 hour(s)).  Time coordinating discharge:  35 mins   SIGNED:  Standley Dakins, MD  [facility] 12/27/2022, 12:42 PM How to contact the [facility] Attending or Consulting provider 7A - 7P or covering provider during after hours 7P -7A, for this  patient?  Check the care team in [facility] and look for a) attending/consulting [facility] provider listed and b) the [facility] team listed Log into [email or web address] and use [place]'s universal password to access. If you do not have the password, please contact the hospital operator. Locate the [facility] provider you are looking for under [facility] and page to a number that you can be directly reached. If you still have difficulty reaching the provider, please page the [facility] (Director on Call) for the Hospitalists listed on amion for assistance.

## 2022-12-31 ENCOUNTER — Encounter (HOSPITAL_COMMUNITY)
Admission: RE | Disposition: A | Discharge status: Home / Self Care | Payer: Self-pay | Source: Home / Self Care | Attending: Internal Medicine

## 2022-12-31 ENCOUNTER — Ambulatory Visit (HOSPITAL_BASED_OUTPATIENT_CLINIC_OR_DEPARTMENT_OTHER): Payer: Medicare Other | Admitting: Anesthesiology

## 2022-12-31 ENCOUNTER — Encounter (HOSPITAL_COMMUNITY): Payer: Self-pay | Admitting: Internal Medicine

## 2022-12-31 ENCOUNTER — Ambulatory Visit (HOSPITAL_COMMUNITY)
Admission: RE | Admit: 2022-12-31 | Discharge: 2022-12-31 | Disposition: A | Discharge status: Home / Self Care | Payer: Medicare Other | Attending: Internal Medicine | Admitting: Internal Medicine

## 2022-12-31 ENCOUNTER — Ambulatory Visit (HOSPITAL_COMMUNITY): Payer: Self-pay | Admitting: Anesthesiology

## 2022-12-31 DIAGNOSIS — K2100 Gastro-esophageal reflux disease with esophagitis, without bleeding: Secondary | ICD-10-CM | POA: Diagnosis not present

## 2022-12-31 DIAGNOSIS — K449 Diaphragmatic hernia without obstruction or gangrene: Secondary | ICD-10-CM | POA: Insufficient documentation

## 2022-12-31 DIAGNOSIS — Z79899 Other long term (current) drug therapy: Secondary | ICD-10-CM | POA: Insufficient documentation

## 2022-12-31 DIAGNOSIS — K209 Esophagitis, unspecified without bleeding: Secondary | ICD-10-CM

## 2022-12-31 DIAGNOSIS — I10 Essential (primary) hypertension: Secondary | ICD-10-CM

## 2022-12-31 DIAGNOSIS — Z87891 Personal history of nicotine dependence: Secondary | ICD-10-CM | POA: Diagnosis not present

## 2022-12-31 DIAGNOSIS — K222 Esophageal obstruction: Secondary | ICD-10-CM | POA: Insufficient documentation

## 2022-12-31 DIAGNOSIS — R1310 Dysphagia, unspecified: Secondary | ICD-10-CM

## 2022-12-31 DIAGNOSIS — I350 Nonrheumatic aortic (valve) stenosis: Secondary | ICD-10-CM | POA: Insufficient documentation

## 2022-12-31 DIAGNOSIS — I129 Hypertensive chronic kidney disease with stage 1 through stage 4 chronic kidney disease, or unspecified chronic kidney disease: Secondary | ICD-10-CM | POA: Diagnosis not present

## 2022-12-31 DIAGNOSIS — E039 Hypothyroidism, unspecified: Secondary | ICD-10-CM | POA: Insufficient documentation

## 2022-12-31 DIAGNOSIS — R1013 Epigastric pain: Secondary | ICD-10-CM

## 2022-12-31 DIAGNOSIS — N189 Chronic kidney disease, unspecified: Secondary | ICD-10-CM | POA: Insufficient documentation

## 2022-12-31 HISTORY — PX: MALONEY DILATION: SHX5535

## 2022-12-31 HISTORY — PX: BIOPSY: SHX5522

## 2022-12-31 HISTORY — PX: ESOPHAGOGASTRODUODENOSCOPY (EGD) WITH PROPOFOL: SHX5813

## 2022-12-31 LAB — POCT I-STAT, CHEM 8
BUN: 7 mg/dL — ABNORMAL LOW (ref 8–23)
Calcium, Ion: 1.15 mmol/L (ref 1.15–1.40)
Chloride: 102 mmol/L (ref 98–111)
Creatinine, Ser: 1.1 mg/dL (ref 0.61–1.24)
Glucose, Bld: 131 mg/dL — ABNORMAL HIGH (ref 70–99)
HCT: 42 % (ref 39.0–52.0)
Hemoglobin: 14.3 g/dL (ref 13.0–17.0)
Potassium: 3.9 mmol/L (ref 3.5–5.1)
Sodium: 143 mmol/L (ref 135–145)
TCO2: 32 mmol/L (ref 22–32)

## 2022-12-31 SURGERY — ESOPHAGOGASTRODUODENOSCOPY (EGD) WITH PROPOFOL
Anesthesia: General

## 2022-12-31 MED ORDER — LACTATED RINGERS IV SOLN: INTRAVENOUS | Status: DC

## 2022-12-31 MED ORDER — PROPOFOL 10 MG/ML IV BOLUS
INTRAVENOUS | Status: DC | PRN
Start: 2022-12-31 — End: 2022-12-31
  Administered 2022-12-31: 70 mg via INTRAVENOUS
  Administered 2022-12-31: 30 mg via INTRAVENOUS

## 2022-12-31 MED ORDER — PHENYLEPHRINE 80 MCG/ML (10ML) SYRINGE FOR IV PUSH (FOR BLOOD PRESSURE SUPPORT)
PREFILLED_SYRINGE | INTRAVENOUS | Status: DC | PRN
Start: 2022-12-31 — End: 2022-12-31
  Administered 2022-12-31 (×4): 160 ug via INTRAVENOUS
  Administered 2022-12-31: 80 ug via INTRAVENOUS

## 2022-12-31 MED ORDER — STERILE WATER FOR IRRIGATION IR SOLN
Status: DC | PRN
  Administered 2022-12-31: 100 mL

## 2022-12-31 MED ORDER — PROPOFOL 500 MG/50ML IV EMUL
INTRAVENOUS | Status: DC | PRN
  Administered 2022-12-31: 100 ug/kg/min via INTRAVENOUS

## 2022-12-31 MED ORDER — EPHEDRINE SULFATE-NACL 50-0.9 MG/10ML-% IV SOSY
PREFILLED_SYRINGE | INTRAVENOUS | Status: DC | PRN
  Administered 2022-12-31: 5 mg via INTRAVENOUS

## 2022-12-31 MED ORDER — LIDOCAINE HCL (CARDIAC) PF 100 MG/5ML IV SOSY
PREFILLED_SYRINGE | INTRAVENOUS | Status: DC | PRN
  Administered 2022-12-31: 30 mg via INTRAVENOUS
  Administered 2022-12-31: 70 mg via INTRAVENOUS

## 2022-12-31 NOTE — H&P (Signed)
@LOGO @   Primary Care Physician:  Benita Stabile, MD Primary Gastroenterologist:  Dr. Jena Gauss  Pre-Procedure History & Physical: HPI:  Calvin Moreno is a 82 y.o. male here for   Further evaluation of increased dyspepsia reflux and now esophageal dysphagia.  Longstanding GERD Protonix 30 mg daily chronically escalated to twice daily recently when he was seen in the office.   Increasing Protonix has made no difference in his symptoms.   Past Medical History:  Diagnosis Date   Aortic stenosis, mod 2021   And insufficiency, evaluation by Dr. Daleen Squibb in 2000   Arthritis    Benign prostatic hypertrophy    PSA of 1.39 in 09/2010   Chronic kidney disease    Borderline; creatinine of 1.6 in 08/2009, but 1.15 in 05/2010   Chronic sinusitis    By MRI   Diverticulosis 2013   GERD (gastroesophageal reflux disease)    Hearing impaired    Hemorrhoids 2006   Hiatal hernia 2006   HOH (hard of hearing)    Hyperlipidemia    Elevated triglycerides   Hypertension    Mild internal carotid artery plaque on MRI/MRA in 2002   Hypothyroidism    IBS (irritable bowel syndrome)    Insomnia    Left bundle branch block 2012   2012   Metabolic syndrome    Fasting hyperglycemia   Murmur    Nephrolithiasis 08/2009   2011   Obesity    Proctitis    radiation induced   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   s/p XRT   Seasonal allergies    Stroke (HCC)    TIA, no residual, seen on MRI   Tobacco abuse, in remission    60 pack years; quit in 1999   Uses hearing aid    bilateral    Past Surgical History:  Procedure Laterality Date   ANKLE FRACTURE SURGERY     left   CATARACT EXTRACTION Left    CATARACT EXTRACTION W/PHACO Right 12/19/2013   Procedure: CATARACT EXTRACTION PHACO AND INTRAOCULAR LENS PLACEMENT RIGHT EYE CDE=14.78;  Surgeon: Gemma Payor, MD;  Location: [facility];  Service: Ophthalmology;  Laterality: Right;   CHOLECYSTECTOMY     COLONOSCOPY  2006   Dr. Henry Russel mucosa throughout colon,  moderate internal hemorrhoids. bx= benign colonic mucosa   COLONOSCOPY  12/03/2011   Dr. Jena Gauss- colonic diverticulosis, radiation-induced proctitis- s/p APC ablation, no microscopic colitis on bx   COLONOSCOPY WITH PROPOFOL N/A 06/12/2020   Procedure: COLONOSCOPY WITH PROPOFOL;  Surgeon: Corbin Ade, MD;  Location: [facility];  Service: Endoscopy;  Laterality: N/A;  2:45pm   ESOPHAGOGASTRODUODENOSCOPY  2006   Dr. Randa Evens- small hiatal hernia and slightly reddened distal esophagus o/w normal   FLEXIBLE SIGMOIDOSCOPY N/A 07/13/2013   [id number] changes of the rectum with active oozing consistent with chronic radiation proctitis.  Otherwise, negative sigmoidoscopy to 40 cm.Status post argon plasma coagulation ablation    HOT HEMOSTASIS N/A 07/13/2013   Procedure: HOT HEMOSTASIS (ARGON PLASMA COAGULATION/BICAP);  Surgeon: Corbin Ade, MD;  Location: [facility];  Service: Endoscopy;  Laterality: N/A;   Ileocolonoscopy  12/03/2011   [id number] diverticulosis. Radiation-induced proctitis status post APC ablation. Status post segmental colon Biopsy   TONSILLECTOMY      Prior to Admission medications   Medication Sig Start Date End Date Taking? Authorizing Provider  acyclovir (ZOVIRAX) 800 MG tablet Take 800 mg by mouth See admin instructions. Take 800 mg daily, increase to 800 mg 3 times daily as  needed when having a fever blister flare   Yes [provider]  ALPRAZolam (XANAX) 0.5 MG tablet Take 0.5 mg by mouth at bedtime as needed for sleep.   Yes [provider]  Ascorbic Acid (VITAMIN C) 1000 MG tablet Take 1,000 mg by mouth daily.   Yes [provider]  aspirin EC 81 MG tablet Take 81 mg by mouth at bedtime.   Yes [provider]  finasteride (PROSCAR) 5 MG tablet Take 1 tablet (5 mg total) by mouth daily. 08/13/22  Yes McKenzie, Mardene Celeste, MD  fluorometholone (FML) 0.1 % ophthalmic suspension SMARTSIG:In Eye(s) 11/26/22  Yes [provider]  fluticasone (CUTIVATE) 0.05 % cream SMARTSIG:Topical 1-2 Times Daily PRN 12/10/22  Yes [provider]  furosemide (LASIX) 40 MG tablet Take 40 mg by mouth daily. 12/10/21  Yes [provider]  gabapentin (NEURONTIN) 100 MG capsule Take by mouth. 09/09/22  Yes [provider]  HYDROcodone-acetaminophen (NORCO) 10-325 MG tablet Take 1 tablet by mouth 4 (four) times daily as needed. 11/26/22  Yes [provider]  levothyroxine (SYNTHROID) 25 MCG tablet Take 25 mcg by mouth daily. 10/24/22  Yes [provider]  Melatonin 3 MG TABS Take 3 mg by mouth at bedtime. For sleep   Yes [provider]  pantoprazole (PROTONIX) 40 MG tablet Take 1 tablet (40 mg total) by mouth 2 (two) times daily before a meal. 11/28/22  Yes , Gerrit Friends, MD  Polyethyl Glycol-Propyl Glycol (SYSTANE OP) Place 1 drop into both eyes 2 (two) times daily as needed (dry eyes).   Yes [provider]  potassium chloride (KLOR-CON M) 10 MEQ tablet Take 1 tablet (10 mEq total) by mouth daily. 12/27/22  Yes Johnson, Clanford L, MD  silodosin (RAPAFLO) 8 MG CAPS capsule Take 1 capsule (8 mg total) by mouth at bedtime. 08/14/22  Yes McKenzie, Mardene Celeste, MD  simvastatin (ZOCOR) 20 MG tablet Take 20 mg by mouth every evening.   Yes [provider]  vitamin B-12 (CYANOCOBALAMIN) 1000 MCG tablet Take 2,000 mcg by mouth daily.   Yes [provider]  HYDROcodone-acetaminophen (NORCO/VICODIN) 5-325 MG tablet Take 1 tablet by mouth every 6 (six) hours as needed for moderate pain.    [provider]    Allergies as of 12/09/2022 - Review Complete 11/25/2022  Allergen Reaction Noted   Norvasc [amlodipine besylate] Swelling 09/19/2014   Codeine Nausea And Vomiting 11/06/2011   Amoxicillin Rash 12/16/2021    Family History  Problem Relation Age of Onset   Heart failure Mother    Heart attack Father    Heart failure Brother        bladder cancer (agent orange  exposure)   Cirrhosis Brother        chronic viral hepatitis   Colon cancer Neg Hx     Social History   Socioeconomic History   Marital status: Married    Spouse name: Not on file   Number of children: 2   Years of education: Not on file   Highest education level: Not on file  Occupational History   Occupation: Retired    Associate Professor: [employer]  Tobacco Use   Smoking status: Former    Current packs/day: 0.00    Average packs/day: 1.5 packs/day for 40.0 years (60.0 ttl pk-yrs)    Types: Cigarettes    Start date: 11/04/1957    Quit date: 11/04/1997    Years since quitting: 25.1    Passive exposure:  Never   Smokeless tobacco: Never   Tobacco comments:    Quit in 1999  Vaping Use   Vaping status: Never Used  Substance and Sexual Activity   Alcohol use: No    Alcohol/week: 0.0 standard drinks of alcohol   Drug use: No   Sexual activity: Yes  Other Topics Concern   Not on file  Social History Narrative   Married with 2 children   Lives locally   No regular exercise   Social Determinants of Health   Financial Resource Strain: Not on file  Food Insecurity: Not on file  Transportation Needs: Not on file  Physical Activity: Not on file  Stress: Not on file  Social Connections: Not on file  Intimate Partner Violence: Not on file    Review of Systems: See HPI, otherwise negative ROS  Physical Exam: BP 124/71   Pulse 90   Temp 97.7 F (36.5 C) (Oral)   Resp 20   Ht 5\' 5"  (1.651 m)   Wt 74.6 kg   SpO2 99%   BMI 27.37 kg/m  General:   Alert,  Well-developed, well-nourished, pleasant and cooperative in NAD Mouth:  No deformity or lesions. Neck:  Supple; no masses or thyromegaly. No significant cervical adenopathy. Lungs:  Clear throughout to auscultation.   No wheezes, crackles, or rhonchi. No acute distress. Heart:  Regular rate and rhythm; no murmurs, clicks, rubs,  or gallops. Abdomen: Non-distended, normal bowel sounds.  Soft and nontender without  appreciable mass or hepatosplenomegaly.  Impression/Plan:    Worsening dysphagia reflux is 82 year old gentleman warrants further evaluation via EGD. The risks, benefits, limitations, alternatives and imponderables have been reviewed with the patient. Potential for esophageal dilation, biopsy, etc. have also been reviewed.  Questions have been answered. All parties agreeable.      Notice: This dictation was prepared with Dragon dictation along with smaller phrase technology. Any transcriptional errors that result from this process are unintentional and may not be corrected upon review.

## 2022-12-31 NOTE — Transfer of Care (Signed)
Immediate Anesthesia Transfer of Care Note  Patient: Calvin Moreno  Procedure(s) Performed: ESOPHAGOGASTRODUODENOSCOPY (EGD) WITH PROPOFOL MALONEY DILATION BIOPSY  Patient Location: Short Stay  Anesthesia Type:General  Level of Consciousness: awake  Airway & Oxygen Therapy: Patient Spontanous Breathing and Patient connected to nasal cannula oxygen  Post-op Assessment: Report given to RN and Post -op Vital signs reviewed and stable  Post vital signs: Reviewed and stable  Last Vitals:  Vitals Value Taken Time  BP 101/55 12/31/22 1337  Temp 36.9 C 12/31/22 1337  Pulse 88 12/31/22 1337  Resp 25 12/31/22 1337  SpO2 100 % 12/31/22 1337    Last Pain:  Vitals:   12/31/22 1337  TempSrc: Oral  PainSc: 0-No pain         Complications: No notable events documented.

## 2022-12-31 NOTE — Anesthesia Postprocedure Evaluation (Signed)
Anesthesia Post Note  Patient: Calvin Moreno  Procedure(s) Performed: ESOPHAGOGASTRODUODENOSCOPY (EGD) WITH PROPOFOL MALONEY DILATION BIOPSY  Patient location during evaluation: Phase II Anesthesia Type: General Level of consciousness: awake and alert and oriented Pain management: pain level controlled Vital Signs Assessment: post-procedure vital signs reviewed and stable Respiratory status: spontaneous breathing, nonlabored ventilation and respiratory function stable Cardiovascular status: blood pressure returned to baseline and stable Postop Assessment: no apparent nausea or vomiting Anesthetic complications: no  No notable events documented.   Last Vitals:  Vitals:   12/31/22 1337 12/31/22 1343  BP: (!) 101/55 (!) 131/54  Pulse: 88   Resp: (!) 25   Temp: 36.9 C   SpO2: 100%     Last Pain:  Vitals:   12/31/22 1337  TempSrc: Oral  PainSc: 0-No pain                  C 

## 2022-12-31 NOTE — Anesthesia Preprocedure Evaluation (Signed)
Anesthesia Evaluation  Patient identified by MRN, date of birth, ID band Patient awake    Reviewed: Allergy & Precautions, H&P , NPO status , Patient's Chart, lab work & pertinent test results  Airway Mallampati: III  TM Distance: >3 FB Neck ROM: Full    Dental  (+) Implants, Dental Advisory Given   Pulmonary neg pulmonary ROS, former smoker          Cardiovascular Exercise Tolerance: Good hypertension, Pt. on medications + dysrhythmias + Valvular Problems/Murmurs (severe AS) AS  Rhythm:Regular Rate:Normal + Systolic murmurs 1. The aortic valve is tricuspid. There is severe calcifcation of the  aortic valve. There is severe thickening of the aortic valve. Aortic valve  regurgitation is mild. Severe aortic valve stenosis. Aortic valve mean  gradient measures 35.0 mmHg. Aortic  valve Vmax measures 3.90 m/s. AVA by continuity 0.82cm2, DI 0.24.   2. Left ventricular ejection fraction, by estimation, is 60 to 65%. The  left ventricle has normal function. The left ventricle has no regional  wall motion abnormalities. 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 Diastolic function  indeterminant due to severe MAC.   3. Right ventricular systolic function is normal. The right ventricular  size is normal.   4. Left atrial size was moderately dilated.   5. The mitral valve is degenerative. Trivial mitral valve regurgitation.  Mild calcific mitral stenosis. The mean mitral valve gradient is 4.0 mmHg  at HR 74bpm. Severe mitral annular calcification.   6. The inferior vena cava is normal in size with greater than 50%  respiratory variability, suggesting right atrial pressure of 3 mmHg.      Neuro/Psych  Neuromuscular disease CVA, Residual Symptoms  negative psych ROS   GI/Hepatic Neg liver ROS, hiatal hernia,GERD  Medicated and Poorly Controlled,,  Endo/Other  Hypothyroidism    Renal/GU Renal disease  negative  genitourinary   Musculoskeletal  (+) Arthritis ,    Abdominal   Peds negative pediatric ROS (+)  Hematology negative hematology ROS (+)   Anesthesia Other Findings   Reproductive/Obstetrics negative OB ROS                             Anesthesia Physical Anesthesia Plan  ASA: 4  Anesthesia Plan: General   Post-op Pain Management: Minimal or no pain anticipated   Induction: Intravenous  PONV Risk Score and Plan: 1 and Propofol infusion and Treatment may vary due to age or medical condition  Airway Management Planned: Nasal Cannula and Natural Airway  Additional Equipment:   Intra-op Plan:   Post-operative Plan:   Informed Consent: I have reviewed the patients History and Physical, chart, labs and discussed the procedure including the risks, benefits and alternatives for the proposed anesthesia with the patient or authorized representative who has indicated his/her understanding and acceptance.     Dental advisory given  Plan Discussed with: CRNA and Surgeon  Anesthesia Plan Comments:         Anesthesia Quick Evaluation

## 2022-12-31 NOTE — Op Note (Signed)
[facility] Patient Name: Calvin Moreno Procedure Date: 12/31/2022 1:08 PM MRN: [id number] Date of Birth: 25-Sep-1940 Attending MD: Gennette Pac , MD, [id number] CSN: [id number] Age: 82 Admit Type: Outpatient Procedure:                Upper GI endoscopy Indications:              Dysphagia Providers:                Gennette Pac, MD, Buel Ream. Thomasena Edis RN, RN,                            Zena Amos Referring MD:              Medicines:                Propofol per Anesthesia Complications:            No immediate complications. Estimated Blood Loss:     Estimated blood loss was minimal. Procedure:                Pre-Anesthesia Assessment:                           - Prior to the procedure, a History and Physical                            was performed, and patient medications and                            allergies were reviewed. The patient's tolerance of                            previous anesthesia was also reviewed. The risks                            and benefits of the procedure and the sedation                            options and risks were discussed with the patient.                            All questions were answered, and informed consent                            was obtained. Prior Anticoagulants: The patient has                            taken no anticoagulant or antiplatelet agents. ASA                            Grade Assessment: III - A patient with severe                            systemic disease. After reviewing the risks and  benefits, the patient was deemed in satisfactory                            condition to undergo the procedure.                           After obtaining informed consent, the endoscope was                            passed under direct vision. Throughout the                            procedure, the patient's blood pressure, pulse, and                            oxygen saturations  were monitored continuously. The                            [id number]-H190 ([id number]) scope was introduced through the                            mouth, and advanced to the second part of duodenum.                            The upper GI endoscopy was accomplished without                            difficulty. The patient tolerated the procedure                            well. Scope In: 1:25:01 PM Scope Out: 1:36:05 PM Total Procedure Duration: 0 hours 11 minutes 4 seconds  Findings:      Prominent Schatzki's ring found. Slightly irregular mucosa just above       the GE junction. It may be a bit of mucus and hematin present as the       scope was passed. Gastric cavity empty. Small hiatal hernia.       Normal-appearing gastric mucosa. Patent pylorus. No Barrett's epithelium       or out and out tumor seen.      The duodenal bulb and second portion of the duodenum were normal.      A mild Schatzki ring was found at the gastroesophageal junction. The       scope was withdrawn. Dilation was performed with a Maloney dilator with       mild resistance at 54 Fr. The dilation site was examined following       endoscope reinsertion and showed moderate mucosal disruption. Estimated       blood loss was minimal. Subtly abnormal GE junction biopsied for       histologic study. Impression:               - .                           - Mild Schatzki ring. Dilated and biopsied. Normal  duodenal bulb and second portion of the duodenum                           - Moderate Sedation:      Moderate (conscious) sedation was personally administered by an       anesthesia professional. The following parameters were monitored: oxygen       saturation, heart rate, blood pressure, respiratory rate, EKG, adequacy       of pulmonary ventilation, and response to care. Recommendation:           - Patient has a contact number available for                            emergencies. The signs and  symptoms of potential                            delayed complications were discussed with the                            patient. Return to normal activities tomorrow.                            Written discharge instructions were provided to the                            patient.                           - Advance diet as tolerated. Follow-up on                            pathology. Continue pantoprazole 40 mg orally twice                            daily. Office visit in 2 months Procedure Code(s):        --- Professional ---                           [phone], Esophagogastroduodenoscopy, flexible,                            transoral; diagnostic, including collection of                            specimen(s) by brushing or washing, when performed                            (separate procedure)                           43450, Dilation of esophagus, by unguided sound or                            bougie, single or multiple passes Diagnosis Code(s):        --- Professional ---  K22.2, Esophageal obstruction                           R13.10, Dysphagia, unspecified CPT copyright 2022 [employer]. All rights reserved. The codes documented in this report are preliminary and upon coder review may  be revised to meet current compliance requirements. Gerrit Friends. , MD Gennette Pac, MD 12/31/2022 1:49:30 PM This report has been signed electronically. Number of Addenda: 0

## 2022-12-31 NOTE — Anesthesia Procedure Notes (Signed)
Date/Time: 12/31/2022 1:28 PM  Performed by: Julian Reil, CRNAPre-anesthesia Checklist: Patient identified, Emergency Drugs available, Suction available and Patient being monitored Patient Re-evaluated:Patient Re-evaluated prior to induction Oxygen Delivery Method: Nasal cannula Induction Type: IV induction Placement Confirmation: positive ETCO2

## 2022-12-31 NOTE — Discharge Instructions (Addendum)
EGD Discharge instructions Please read the instructions outlined below and refer to this sheet in the next few weeks. These discharge instructions provide you with general information on caring for yourself after you leave the hospital. Your doctor may also give you specific instructions. While your treatment has been planned according to the most current medical practices available, unavoidable complications occasionally occur. If you have any problems or questions after discharge, please call your doctor. ACTIVITY You may resume your regular activity but move at a slower pace for the next 24 hours.  Take frequent rest periods for the next 24 hours.  Walking will help expel (get rid of) the air and reduce the bloated feeling in your abdomen.  No driving for 24 hours (because of the anesthesia (medicine) used during the test).  You may shower.  Do not sign any important legal documents or operate any machinery for 24 hours (because of the anesthesia used during the test).  NUTRITION Drink plenty of fluids.  You may resume your normal diet.  Begin with a light meal and progress to your normal diet.  Avoid alcoholic beverages for 24 hours or as instructed by your caregiver.  MEDICATIONS You may resume your normal medications unless your caregiver tells you otherwise.  WHAT YOU CAN EXPECT TODAY You may experience abdominal discomfort such as a feeling of fullness or "gas" pains.  FOLLOW-UP Your doctor will discuss the results of your test with you.  SEEK IMMEDIATE MEDICAL ATTENTION IF ANY OF THE FOLLOWING OCCUR: Excessive nausea (feeling sick to your stomach) and/or vomiting.  Severe abdominal pain and distention (swelling).  Trouble swallowing.  Temperature over 101 F (37.8 C).  Rectal bleeding or vomiting of blood.        Schatzki's ring found today.  Esophagus dilated  You have a small hiatal hernia.  The lining of your esophagus where it connects to your stomach appeared somewhat  irregular I took biopsies.    Further recommendations to follow pending review of pathology report   office visit with me in 2 months   start with clear liquids after procedure but gradually can advance his diet as tolerated by this evening   at patient request, I called Elna Breslow, Montez Hageman. at [phone] reviewed findings and recommendations

## 2023-01-04 ENCOUNTER — Encounter: Payer: Self-pay | Admitting: Internal Medicine

## 2023-01-05 ENCOUNTER — Encounter (HOSPITAL_COMMUNITY): Payer: Self-pay | Admitting: Internal Medicine

## 2023-02-06 ENCOUNTER — Ambulatory Visit: Payer: Medicare Other | Attending: Cardiology | Admitting: Cardiology

## 2023-02-06 ENCOUNTER — Encounter: Payer: Self-pay | Admitting: Cardiology

## 2023-02-06 VITALS — BP 130/60 | HR 90 | Ht 66.0 in | Wt 163.0 lb

## 2023-02-06 DIAGNOSIS — E782 Mixed hyperlipidemia: Secondary | ICD-10-CM | POA: Diagnosis present

## 2023-02-06 DIAGNOSIS — I10 Essential (primary) hypertension: Secondary | ICD-10-CM | POA: Diagnosis not present

## 2023-02-06 DIAGNOSIS — I350 Nonrheumatic aortic (valve) stenosis: Secondary | ICD-10-CM | POA: Insufficient documentation

## 2023-02-06 NOTE — Patient Instructions (Signed)
Medication Instructions:  Your physician recommends that you continue on your current medications as directed. Please refer to the Current Medication list given to you today.  *If you need a refill on your cardiac medications before your next appointment, please call your pharmacy*   Lab Work: None If you have labs (blood work) drawn today and your tests are completely normal, you will receive your results only by: MyChart Message (if you have MyChart) OR A paper copy in the mail If you have any lab test that is abnormal or we need to change your treatment, we will call you to review the results.   Testing/Procedures: Your physician has requested that you have an echocardiogram. Echocardiography is a painless test that uses sound waves to create images of your heart. It provides your doctor with information about the size and shape of your heart and how well your heart's chambers and valves are working. This procedure takes approximately one hour. There are no restrictions for this procedure. Please do NOT wear cologne, perfume, aftershave, or lotions (deodorant is allowed). Please arrive 15 minutes prior to your appointment time.    Follow-Up: At [facility], you and your health needs are our priority.  As part of our continuing mission to provide you with exceptional heart care, we have created designated Provider Care Teams.  These Care Teams include your primary Cardiologist (physician) and Advanced Practice Providers (APPs -  Physician Assistants and Nurse Practitioners) who all work together to provide you with the care you need, when you need it.  We recommend signing up for the patient portal called "MyChart".  Sign up information is provided on this After Visit Summary.  MyChart is used to connect with patients for Virtual Visits (Telemedicine).  Patients are able to view lab/test results, encounter notes, upcoming appointments, etc.  Non-urgent messages can be sent to your  provider as well.   To learn more about what you can do with MyChart, go to [email or web address].    Your next appointment:   3  month(s)  Provider:   You may see Dina Rich, MD   Other Instructions

## 2023-02-06 NOTE — Progress Notes (Signed)
Clinical Summary Calvin Moreno is a 82 y.o.male seen today for follow up of lthe following medical problems.    1. Aortic stenosis - 06/2017 echo LVEF 60-65%, grade II diastolic dysfunction, moderate to severe AS per report (mean grad 22, AVA VTI 1, dimensionless index 0.35). 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 Mitral stenosis - mild by 12/2022 echo mean grad 4   2. HTN -compliant with meds   3. Hyperlipidemia - reports side effects on other statins in the past. Has done well on simva 20.  Labs followed by pcp   Jan 2023 TC 118 HDL 33 LDL 55 TG 148 - upcomng labs with pcp   4. Sinus tachcyardia Patient with frequent elevated heart rates in clinic. Checks heart rates regularly at home, typically in low 90s. Often has anxiety in the clinic       5. Chronic LBBB   Past Medical History:  Diagnosis Date   Aortic stenosis, mod 2021   And insufficiency, evaluation by Dr. Daleen Squibb in 2000   Arthritis    Benign prostatic hypertrophy    PSA of 1.39 in 09/2010   Chronic kidney disease    Borderline; creatinine of 1.6 in 08/2009, but 1.15 in 05/2010   Chronic sinusitis    By MRI   Diverticulosis 2013   GERD (gastroesophageal reflux disease)    Hearing impaired    Hemorrhoids 2006   Hiatal hernia 2006   HOH (hard of hearing)    Hyperlipidemia    Elevated triglycerides   Hypertension    Mild internal carotid artery plaque on MRI/MRA in 2002   Hypothyroidism    IBS (irritable bowel syndrome)    Insomnia    Left bundle [place] block 2012   2012   Metabolic syndrome    Fasting hyperglycemia   Murmur    Nephrolithiasis 08/2009   2011   Obesity    Proctitis    radiation induced    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   s/p XRT   Seasonal allergies    Stroke (HCC)    TIA, no residual, seen on MRI   Tobacco abuse, in remission    60 pack years; quit in 1999   Uses hearing aid    bilateral     Allergies  Allergen Reactions   Norvasc [Amlodipine Besylate] Swelling    Ankle swelling   Codeine Nausea And Vomiting   Amoxicillin Rash     Current Outpatient Medications  Medication Sig Dispense Refill   acyclovir (ZOVIRAX) 800 MG tablet Take 800 mg by mouth See admin instructions. Take 800 mg daily, increase to 800 mg 3 times daily as needed when having a fever blister flare     ALPRAZolam (XANAX) 0.5 MG tablet Take 0.5 mg by mouth at bedtime as needed for sleep.     Ascorbic Acid (VITAMIN C) 1000 MG tablet Take 1,000 mg by mouth daily.     aspirin EC 81 MG tablet Take 81 mg by mouth at bedtime.     finasteride (PROSCAR) 5 MG tablet Take 1 tablet (5 mg total) by mouth daily.  90 tablet 3   fluorometholone (FML) 0.1 % ophthalmic suspension SMARTSIG:In Eye(s)     fluticasone (CUTIVATE) 0.05 % cream SMARTSIG:Topical 1-2 Times Daily PRN     furosemide (LASIX) 40 MG tablet Take 40 mg by mouth daily.     gabapentin (NEURONTIN) 100 MG capsule Take by mouth.     HYDROcodone-acetaminophen (NORCO) 10-325 MG tablet Take 1 tablet by mouth 4 (four) times daily as needed.     HYDROcodone-acetaminophen (NORCO/VICODIN) 5-325 MG tablet Take 1 tablet by mouth every 6 (six) hours as needed for moderate pain.     levothyroxine (SYNTHROID) 25 MCG tablet Take 25 mcg by mouth daily.     Melatonin 3 MG TABS Take 3 mg by mouth at bedtime. For sleep     pantoprazole (PROTONIX) 40 MG tablet Take 1 tablet (40 mg total) by mouth 2 (two) times daily before a meal. 60 tablet 11   Polyethyl Glycol-Propyl Glycol (SYSTANE OP) Place 1 drop into both eyes 2 (two) times daily as needed (dry eyes).     potassium chloride (KLOR-CON M) 10 MEQ tablet Take 1 tablet (10 mEq total) by mouth daily. 30 tablet 0    silodosin (RAPAFLO) 8 MG CAPS capsule Take 1 capsule (8 mg total) by mouth at bedtime. 90 capsule 3   simvastatin (ZOCOR) 20 MG tablet Take 20 mg by mouth every evening.     vitamin B-12 (CYANOCOBALAMIN) 1000 MCG tablet Take 2,000 mcg by mouth daily.     No current facility-administered medications for this visit.     Past Surgical History:  Procedure Laterality Date   ANKLE FRACTURE SURGERY     left   BIOPSY  12/31/2022   Procedure: BIOPSY;  Surgeon: Corbin Ade, MD;  Location: [facility];  Service: Endoscopy;;   CATARACT EXTRACTION Left    CATARACT EXTRACTION W/PHACO Right 12/19/2013   Procedure: CATARACT EXTRACTION PHACO AND INTRAOCULAR LENS PLACEMENT RIGHT EYE CDE=14.78;  Surgeon: Gemma Payor, MD;  Location: [facility];  Service: Ophthalmology;  Laterality: Right;   CHOLECYSTECTOMY     COLONOSCOPY  2006   Dr. Henry Russel mucosa throughout colon, moderate internal hemorrhoids. bx= benign colonic mucosa   COLONOSCOPY  12/03/2011   Dr. Jena Gauss- colonic diverticulosis, radiation-induced proctitis- s/p APC ablation, no microscopic colitis on bx   COLONOSCOPY WITH PROPOFOL N/A 06/12/2020   Procedure: COLONOSCOPY WITH PROPOFOL;  Surgeon: Corbin Ade, MD;  Location: [facility];  Service: Endoscopy;  Laterality: N/A;  2:45pm   ESOPHAGOGASTRODUODENOSCOPY  2006   Dr. Randa Evens- small hiatal hernia and slightly reddened distal esophagus o/w normal   ESOPHAGOGASTRODUODENOSCOPY (EGD) WITH PROPOFOL N/A 12/31/2022   Procedure: ESOPHAGOGASTRODUODENOSCOPY (EGD) WITH PROPOFOL;  Surgeon: Corbin Ade, MD;  Location: [facility];  Service: Endoscopy;  Laterality: N/A;  1245pm, asa 3   FLEXIBLE SIGMOIDOSCOPY N/A 07/13/2013   [id number] changes of the rectum with active oozing consistent with chronic radiation proctitis.  Otherwise, negative sigmoidoscopy to 40 cm.Status post argon plasma coagulation ablation    HOT HEMOSTASIS N/A 07/13/2013   Procedure: HOT HEMOSTASIS (ARGON PLASMA  COAGULATION/BICAP);  Surgeon: Corbin Ade, MD;  Location: [facility];  Service: Endoscopy;  Laterality: N/A;   Ileocolonoscopy  12/03/2011   [id number] diverticulosis. Radiation-induced proctitis status post APC ablation. Status post segmental colon Biopsy   MALONEY DILATION N/A 12/31/2022   Procedure: MALONEY DILATION;  Surgeon: Corbin Ade, MD;  Location: [facility];  Service: Endoscopy;  Laterality: N/A;   TONSILLECTOMY  Allergies  Allergen Reactions   Norvasc [Amlodipine Besylate] Swelling    Ankle swelling   Codeine Nausea And Vomiting   Amoxicillin Rash      Family History  Problem Relation Age of Onset   Heart failure Mother    Heart attack Father    Heart failure Brother        bladder cancer (agent orange exposure)   Cirrhosis Brother        chronic viral hepatitis   Colon cancer Neg Hx      Social History Mr. Feuerhelm reports that he quit smoking about 25 years ago. His smoking use included cigarettes. He started smoking about 65 years ago. He has a 60 pack-year smoking history. He has never been exposed to tobacco smoke. He has never used smokeless tobacco. Mr. Grieger reports no history of alcohol use.   Review of Systems CONSTITUTIONAL: No weight loss, fever, chills, weakness or fatigue.  HEENT: Eyes: No visual loss, blurred vision, double vision or yellow sclerae.No hearing loss, sneezing, congestion, runny nose or sore throat.  SKIN: No rash or itching.  CARDIOVASCULAR: per hpi RESPIRATORY: No shortness of breath, cough or sputum.  GASTROINTESTINAL: No anorexia, nausea, vomiting or diarrhea. No abdominal pain or blood.  GENITOURINARY: No burning on urination, no polyuria NEUROLOGICAL: No headache, dizziness, syncope, paralysis, ataxia, numbness or tingling in the extremities. No change in bowel or bladder control.  MUSCULOSKELETAL: No muscle, back pain, joint pain or stiffness.  LYMPHATICS: No enlarged nodes. No history of splenectomy.   PSYCHIATRIC: No history of depression or anxiety.  ENDOCRINOLOGIC: No reports of sweating, cold or heat intolerance. No polyuria or polydipsia.  Marland Kitchen   Physical Examination Today's Vitals   02/06/23 1118  BP: 130/60  Pulse: 90  SpO2: 98%  Weight: 163 lb (73.9 kg)  Height: 5\' 6"  (1.676 m)   Body mass index is 26.31 kg/m.  Gen: resting comfortably, no acute distress HEENT: no scleral icterus, pupils equal round and reactive, no palptable cervical adenopathy,  CV: RRR, 3/6 systolic murmur rusb, no jvd Resp: Clear to auscultation bilaterally GI: abdomen is soft, non-tender, non-distended, normal bowel sounds, no hepatosplenomegaly MSK: extremities are warm, no edema.  Skin: warm, no rash Neuro:  no focal deficits Psych: appropriate affect   Diagnostic Studies 07/2012 Echo LVEF 55-60%, no WMA, mild-mod AS (mean grad 11, AVA by planimetry 1.2, continuity area not listed)   07/2014 echo Study Conclusions  - Left ventricle: The cavity size was normal. Wall thickness was   increased in a pattern of mild LVH. Systolic function was normal.   The estimated ejection fraction was in the range of 60% to 65%.   Wall motion was normal; there were no regional wall motion   abnormalities. Doppler parameters are consistent with abnormal   left ventricular relaxation (grade 1 diastolic dysfunction). - Ventricular septum: Septal motion showed abnormal function and   dyssynergy. - Aortic valve: Moderately calcified annulus. Trileaflet;   moderately calcified leaflets. Cusp separation was moderately   reduced. There was moderate stenosis. There was mild   regurgitation. Mean gradient (S): 18 mm Hg. Peak gradient (S): 32   mm Hg. VTI ratio of LVOT to aortic valve: 0.43. Valve area (VTI):   1.25 cm^2. Planimetry valve area 1.5 cm2. - Mitral valve: Moderately calcified annulus. There was trivial   regurgitation. - Left atrium: The atrium was mildly dilated. - Right atrium: Central venous  pressure (est): 3 mm Hg. - Atrial septum: No defect or patent foramen  ovale was identified. - Tricuspid valve: There was trivial regurgitation. - Pulmonary arteries: Systolic pressure could not be accurately   estimated. - Pericardium, extracardiac: There was no pericardial effusion.     07/2016 echo Study Conclusions   - Left ventricle: The cavity size was normal. Wall thickness was   increased in a pattern of mild LVH. Systolic function was normal.   The estimated ejection fraction was in the range of 60% to 65%.   Wall motion was normal; there were no regional wall motion   abnormalities. - Aortic valve: Moderately calcified annulus. Moderately calcified   leaflets. There was mild regurgitation. Mean gradient (S): 18 mm   Hg. Peak gradient (S): 36 mm Hg. VTI ratio of LVOT to aortic   valve: 0.38. Valve area (VTI): 0.77 cm^2. Somewhat discordant   information in grading degree of aortic stenosis. Gradients   suggest mild range, however valve appearance and LVOT to AV   ratios are more consistent with moderate to severe aortic   stenosis. - Mitral valve: Moderately calcified annulus. There was trivial   regurgitation. - Right atrium: Central venous pressure (est): 3 mm Hg. - Atrial septum: No defect or patent foramen ovale was identified. - Tricuspid valve: There was trivial regurgitation. - Pulmonary arteries: Systolic pressure could not be accurately   estimated. - Pericardium, extracardiac: A prominent pericardial fat pad was   present.   Impressions:   - Mild LVH with LVEF 60-65%. Grade 2 diastolic dysfunction   suspected. Aortic valve is moderately calcified with at least   moderate calcific aortic stenosis as detailed above, information   is somewhat discordant however. Mild aortic regurgitation.   Moderate mitral annular calcification with trivial mitral   regurgitation. Trivial tricuspid regurgitation.   06/2017 echo Study Conclusions   - Left ventricle: The  cavity size was normal. There was severe   concentric hypertrophy. Systolic function was normal. The   estimated ejection fraction was in the range of 60% to 65%. Wall   motion was normal; there were no regional wall motion   abnormalities. Features are consistent with a pseudonormal left   ventricular filling pattern, with concomitant abnormal relaxation   and increased filling pressure (grade 2 diastolic dysfunction).   Doppler parameters are consistent with high ventricular filling   pressure. - Ventricular septum: Septal motion showed abnormal function and   dyssynergy. These changes are consistent with a left bundle   Adaora Mchaney block. - Aortic valve: Moderately to severely calcified annulus.   Trileaflet; moderately calcified leaflets. There was moderate to   severe stenosis. There was mild regurgitation. Peak velocity (S):   325 cm/s. Mean gradient (S): 22 mm Hg. Valve area (VTI): 1 cm^2.   Valve area (Vmax): 0.9 cm^2. Valve area (Vmean): 0.99 cm^2. - Mitral valve: Moderately calcified annulus. - Right ventricle: Systolic function was mildly reduced. - Atrial septum: No defect or patent foramen ovale was identified.   07/2018 echo IMPRESSIONS     1. The left ventricle has hyperdynamic systolic function, with an  ejection fraction of >65%. There is moderate asymmetric septal left  ventricular hypertrophy. Diastolic parameters are consistent with  restrictive filling.   2. The right ventricle has normal systolic function. The cavity was  normal. There is no increase in right ventricular wall thickness. Right  ventricular systolic pressure could not be assessed.   3. The mitral valve is normal in structure. There is moderate mitral  annular calcification present.   4. The aortic root is normal  in size and structure.   5. The tricuspid valve is normal in structure.   6. Aortic valve is possibly tricuspid, but leaflet structure not well  defined. Aortic valve regurgitation is mild  by color flow Doppler. There  is moderate to more likely severe aortic stenosis with LVOT/AV VTI 0.33  and calculated valve area 0.83 cm2.  Gradients suggest paradoxical low gradient stenosis in the setting of  normal LVEF.    12/2021 echo 1. The aortic valve is tricuspid. There is severe calcifcation of the  aortic valve. There is severe thickening of the aortic valve. Aortic valve  regurgitation is mild. Severe aortic valve stenosis. Aortic valve mean  gradient measures 35.0 mmHg. Aortic  valve Vmax measures 3.90 m/s. AVA by continuity 0.82cm2, DI 0.24.   2. Left ventricular ejection fraction, by estimation, is 60 to 65%. The  left ventricle has normal function. The left ventricle has no regional  wall motion abnormalities. 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 Diastolic function  indeterminant due to severe MAC.   3. Right ventricular systolic function is normal. The right ventricular  size is normal.   4. Left atrial size was moderately dilated.   5. The mitral valve is degenerative. Trivial mitral valve regurgitation.  Mild calcific mitral stenosis. The mean mitral valve gradient is 4.0 mmHg  at HR 74bpm. Severe mitral annular calcification.   6. The inferior vena cava is normal in size with greater than 50%  respiratory variability, suggesting right atrial pressure of 3 mmHg.    Assessment and Plan  1. Aortic stenosis - severe by 12/2021 echo. 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 For his level of exertion denies specific symptoms - f/u repeat echo, if any change in LVEF would warrant evaluation for TAVR. If stable LVEF f/u 3 months reassess if any more distinct symptoms.    2. HTN - at goal, continue current meds   3. Hyperlipidemia - has been at goal, f/u upcoming labs   F/u 3 months with  me    Calvin Moreno, M.D.

## 2023-02-11 ENCOUNTER — Ambulatory Visit (INDEPENDENT_AMBULATORY_CARE_PROVIDER_SITE_OTHER): Payer: Medicare Other | Admitting: Urology

## 2023-02-11 VITALS — BP 139/79 | HR 106

## 2023-02-11 DIAGNOSIS — N401 Enlarged prostate with lower urinary tract symptoms: Secondary | ICD-10-CM

## 2023-02-11 DIAGNOSIS — N200 Calculus of kidney: Secondary | ICD-10-CM | POA: Diagnosis not present

## 2023-02-11 DIAGNOSIS — N138 Other obstructive and reflux uropathy: Secondary | ICD-10-CM | POA: Diagnosis not present

## 2023-02-11 DIAGNOSIS — R3912 Poor urinary stream: Secondary | ICD-10-CM

## 2023-02-11 MED ORDER — SILODOSIN 8 MG PO CAPS: 8.0000 mg | ORAL_CAPSULE | Freq: Two times a day (BID) | ORAL | 3 refills | Status: DC

## 2023-02-11 MED ORDER — FINASTERIDE 5 MG PO TABS: 5.0000 mg | ORAL_TABLET | Freq: Every day | ORAL | 3 refills | Status: DC

## 2023-02-11 NOTE — Progress Notes (Signed)
02/11/2023 3:40 PM   Calvin Moreno 12/02/1940 [id number]  Referring provider: Gareth Morgan, MD [address] [place],  [place] [address]  Followup BPH and nephrolithiasis   HPI: Mr Danziger is a 81yo here for followup for BPh with weak urinary stream and nephrolithiasis. IPSS 16 QOl 3 on rapaflo and finasteride. Urine stream remains weak. He has starting stopping of his urinary stream. He has intermittent straining to urinate. Nocturia 1-3x depending on fluid consumption.    PMH: Past Medical History:  Diagnosis Date   Aortic stenosis, mod 2021   And insufficiency, evaluation by Dr. Daleen Squibb in 2000   Arthritis    Benign prostatic hypertrophy    PSA of 1.39 in 09/2010   Chronic kidney disease    Borderline; creatinine of 1.6 in 08/2009, but 1.15 in 05/2010   Chronic sinusitis    By MRI   Diverticulosis 2013   GERD (gastroesophageal reflux disease)    Hearing impaired    Hemorrhoids 2006   Hiatal hernia 2006   HOH (hard of hearing)    Hyperlipidemia    Elevated triglycerides   Hypertension    Mild internal carotid artery plaque on MRI/MRA in 2002   Hypothyroidism    IBS (irritable bowel syndrome)    Insomnia    Left bundle branch block 2012   2012   Metabolic syndrome    Fasting hyperglycemia   Murmur    Nephrolithiasis 08/2009   2011   Obesity    Proctitis    radiation induced   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   s/p XRT   Seasonal allergies    Stroke (HCC)    TIA, no residual, seen on MRI   Tobacco abuse, in remission    60 pack years; quit in 1999   Uses hearing aid    bilateral    Surgical History: Past Surgical History:  Procedure Laterality Date   ANKLE FRACTURE SURGERY     left   BIOPSY  12/31/2022   Procedure: BIOPSY;  Surgeon: Corbin Ade, MD;  [facility];  Service: Endoscopy;;   CATARACT EXTRACTION Left    CATARACT EXTRACTION W/PHACO Right 12/19/2013   Procedure: CATARACT EXTRACTION PHACO AND INTRAOCULAR LENS PLACEMENT RIGHT  EYE CDE=14.78;  Surgeon: Gemma Payor, MD;  Location: [facility];  Service: Ophthalmology;  Laterality: Right;   CHOLECYSTECTOMY     COLONOSCOPY  2006   Dr. Henry Russel mucosa throughout colon, moderate internal hemorrhoids. bx= benign colonic mucosa   COLONOSCOPY  12/03/2011   Dr. Jena Gauss- colonic diverticulosis, radiation-induced proctitis- s/p APC ablation, no microscopic colitis on bx   COLONOSCOPY WITH PROPOFOL N/A 06/12/2020   Procedure: COLONOSCOPY WITH PROPOFOL;  Surgeon: Corbin Ade, MD;  [facility];  Service: Endoscopy;  Laterality: N/A;  2:45pm   ESOPHAGOGASTRODUODENOSCOPY  2006   Dr. Randa Evens- small hiatal hernia and slightly reddened distal esophagus o/w normal   ESOPHAGOGASTRODUODENOSCOPY (EGD) WITH PROPOFOL N/A 12/31/2022   Procedure: ESOPHAGOGASTRODUODENOSCOPY (EGD) WITH PROPOFOL;  Surgeon: Corbin Ade, MD;  [facility];  Service: Endoscopy;  Laterality: N/A;  1245pm, asa 3   FLEXIBLE SIGMOIDOSCOPY N/A 07/13/2013   [id number] changes of the rectum with active oozing consistent with chronic radiation proctitis.  Otherwise, negative sigmoidoscopy to 40 cm.Status post argon plasma coagulation ablation    HOT HEMOSTASIS N/A 07/13/2013   Procedure: HOT HEMOSTASIS (ARGON PLASMA COAGULATION/BICAP);  Surgeon: Corbin Ade, MD;  [facility];  Service: Endoscopy;  Laterality: N/A;  Ileocolonoscopy  12/03/2011   [id number] diverticulosis. Radiation-induced proctitis status post APC ablation. Status post segmental colon Biopsy   MALONEY DILATION N/A 12/31/2022   Procedure: MALONEY DILATION;  Surgeon: Corbin Ade, MD;  [facility];  Service: Endoscopy;  Laterality: N/A;   TONSILLECTOMY      Home Medications:  Allergies as of 02/11/2023       Reactions   Norvasc [amlodipine Besylate] Swelling   Ankle swelling   Codeine Nausea And Vomiting   Amoxicillin Rash        Medication List        Accurate as of February 11, 2023   3:40 PM. If you have any questions, ask your nurse or doctor.          acyclovir 800 MG tablet Commonly known as: ZOVIRAX Take 800 mg by mouth See admin instructions. Take 800 mg daily, increase to 800 mg 3 times daily as needed when having a fever blister flare   ALPRAZolam 0.5 MG tablet Commonly known as: XANAX Take 0.5 mg by mouth at bedtime as needed for sleep.   aspirin EC 81 MG tablet Take 81 mg by mouth at bedtime.   cyanocobalamin 1000 MCG tablet Commonly known as: VITAMIN B12 Take 2,000 mcg by mouth daily.   finasteride 5 MG tablet Commonly known as: PROSCAR Take 1 tablet (5 mg total) by mouth daily.   fluorometholone 0.1 % ophthalmic suspension Commonly known as: FML SMARTSIG:In Eye(s)   fluticasone 0.05 % cream Commonly known as: CUTIVATE SMARTSIG:Topical 1-2 Times Daily PRN   furosemide 40 MG tablet Commonly known as: LASIX Take 40 mg by mouth daily.   gabapentin 100 MG capsule Commonly known as: NEURONTIN Take by mouth.   HYDROcodone-acetaminophen 5-325 MG tablet Commonly known as: NORCO/VICODIN Take 1 tablet by mouth every 6 (six) hours as needed for moderate pain.   HYDROcodone-acetaminophen 10-325 MG tablet Commonly known as: NORCO Take 1 tablet by mouth 4 (four) times daily as needed.   levothyroxine 25 MCG tablet Commonly known as: SYNTHROID Take 25 mcg by mouth daily.   melatonin 3 MG Tabs tablet Take 3 mg by mouth at bedtime. For sleep   pantoprazole 40 MG tablet Commonly known as: PROTONIX Take 1 tablet (40 mg total) by mouth 2 (two) times daily before a meal.   potassium chloride 10 MEQ tablet Commonly known as: KLOR-CON M Take 1 tablet (10 mEq total) by mouth daily.   silodosin 8 MG Caps capsule Commonly known as: RAPAFLO Take 1 capsule (8 mg total) by mouth at bedtime.   simvastatin 20 MG tablet Commonly known as: ZOCOR Take 20 mg by mouth every evening.   SYSTANE OP Place 1 drop into both eyes 2 (two) times daily as  needed (dry eyes).   vitamin C 1000 MG tablet Take 1,000 mg by mouth daily.        Allergies:  Allergies  Allergen Reactions   Norvasc [Amlodipine Besylate] Swelling    Ankle swelling   Codeine Nausea And Vomiting   Amoxicillin Rash    Family History: Family History  Problem Relation Age of Onset   Heart failure Mother    Heart attack Father    Heart failure Brother        bladder cancer (agent orange exposure)   Cirrhosis Brother        chronic viral hepatitis   Colon cancer Neg Hx     Social History:  reports that he quit smoking about 25 years  ago. His smoking use included cigarettes. He started smoking about 65 years ago. He has a 60 pack-year smoking history. He has never been exposed to tobacco smoke. He has never used smokeless tobacco. He reports that he does not drink alcohol and does not use drugs.  ROS: All other review of systems were reviewed and are negative except what is noted above in HPI  Physical Exam: BP 139/79   Pulse (!) 106   Constitutional:  Alert and oriented, No acute distress. HEENT: [place] AT, moist mucus membranes.  Trachea midline, no masses. Cardiovascular: No clubbing, cyanosis, or edema. Respiratory: Normal respiratory effort, no increased work of breathing. GI: Abdomen is soft, nontender, nondistended, no abdominal masses GU: No CVA tenderness.  Lymph: No cervical or inguinal lymphadenopathy. Skin: No rashes, bruises or suspicious lesions. Neurologic: Grossly intact, no focal deficits, moving all 4 extremities. Psychiatric: Normal mood and affect.  Laboratory Data: Lab Results  Component Value Date   WBC 11.9 (H) 12/27/2022   HGB 14.3 12/31/2022   HCT 42.0 12/31/2022   MCV 92.5 12/27/2022   PLT 259 12/27/2022    Lab Results  Component Value Date   CREATININE 1.10 12/31/2022    No results found for: "PSA"  No results found for: "TESTOSTERONE"  Lab Results  Component Value Date   HGBA1C 5.9 06/06/2010    Urinalysis     Component Value Date/Time   COLORURINE YELLOW 06/17/2018 0626   APPEARANCEUR Clear 08/13/2022 1502   LABSPEC 1.023 06/17/2018 0626   PHURINE 5.0 06/17/2018 0626   GLUCOSEU Negative 08/13/2022 1502   HGBUR SMALL (A) 06/17/2018 0626   BILIRUBINUR Negative 08/13/2022 1502   KETONESUR NEGATIVE 06/17/2018 0626   PROTEINUR Negative 08/13/2022 1502   PROTEINUR NEGATIVE 06/17/2018 0626   UROBILINOGEN negative (A) 10/19/2019 1444   UROBILINOGEN 0.2 08/26/2009 0930   NITRITE Negative 08/13/2022 1502   NITRITE NEGATIVE 06/17/2018 0626   LEUKOCYTESUR Negative 08/13/2022 1502    Lab Results  Component Value Date   LABMICR Comment 08/13/2022   BACTERIA RARE (A) 06/17/2018    Pertinent Imaging:  No results found for this or any previous visit.  No results found for this or any previous visit.  No results found for this or any previous visit.  No results found for this or any previous visit.  Results for orders placed during the hospital encounter of 10/04/21  Ultrasound renal complete  Narrative CLINICAL DATA:  Nephrolithiasis.  EXAM: RENAL / URINARY TRACT ULTRASOUND COMPLETE  COMPARISON:  Renal ultrasound 10/02/2020.  FINDINGS: Right Kidney:  Renal measurements: 11.2 x 4.7 x 5.5 cm = volume: 149 mL. Echogenicity within normal limits. There is no hydronephrosis. There is an exophytic cyst in the inferior pole measuring 4.6 x 4.1 x 4.3 cm, minimally decreased in size compared to the prior study.  Left Kidney:  Renal measurements: 12.6 x 5.0 x 4.8 cm = volume: 156 mL. Echogenicity within normal limits. No mass or hydronephrosis visualized.  Bladder:  Appears normal for degree of bladder distention.  Other:  None.  IMPRESSION: 1. No acute abnormality involving the kidneys. 2. Right renal cyst measuring 4.6 cm.   Electronically Signed By: Darliss Cheney M.D. On: 10/07/2021 17:23  No valid procedures specified. No results found for this or any previous  visit.  Results for orders placed during the hospital encounter of 06/17/18  CT RENAL STONE STUDY  Narrative CLINICAL DATA:  Right flank pain starting this morning with nausea and vomiting.  EXAM: CT ABDOMEN AND  PELVIS WITHOUT CONTRAST  TECHNIQUE: Multidetector CT imaging of the abdomen and pelvis was performed following the standard protocol without IV contrast.  COMPARISON:  08/26/2009  FINDINGS: Lower chest: Mitral annular calcification. Coronary atherosclerosis. No acute finding  Hepatobiliary: No focal liver abnormality.Cholecystectomy with normal common bile duct diameter.  Pancreas: Unremarkable.  Spleen: Unremarkable.  Adrenals/Urinary Tract: Negative adrenals. Mild right hydroureteronephrosis secondary to a 3 mm stone at the iliac vessel crossing. No additional urolithiasis. Chronic and nonspecific perinephric stranding on both sides. Exophytic right lower pole renal cyst. Prominent bladder wall thickness but under distended.  Stomach/Bowel:  No obstruction. No evident bowel inflammation.  Vascular/Lymphatic: Atherosclerotic calcification of the aorta and iliacs. No mass or adenopathy.  Reproductive:Prostatic calcifications and right-sided fiducial markers.  Other: No ascites or pneumoperitoneum.  Musculoskeletal: No acute abnormalities.  Osteopenia.  IMPRESSION: Mild right hydroureteronephrosis due to a 3 mm stone at the level of the iliac crossing.   Electronically Signed By: Marnee Spring M.D. On: 06/17/2018 07:09   Assessment & Plan:    1. Weak urinary stream -increase silodosin to 8mg  BID  2. Benign prostatic hyperplasia with urinary obstruction -increase rapaflo to 8mg  BID and continue finasteride 5mg  daily.   3. Calculus, renal Followup 1 year with [facility]   No follow-ups on file.  Wilkie Aye, MD  [facility] [place]

## 2023-02-12 ENCOUNTER — Telehealth: Payer: Self-pay

## 2023-02-12 NOTE — Telephone Encounter (Signed)
Patient needing new dosage of medication to be sent into pharmacy. [facility] is stating our office has not sent in the new dosage.   silodosin (RAPAFLO) 8 MG CAPS capsule   [facility] [place], [place] - [address] Phone: [phone]  Fax: [phone]

## 2023-02-13 NOTE — Telephone Encounter (Signed)
Patient is made aware Rx was sent 02/11/23. Voiced understanding

## 2023-02-17 ENCOUNTER — Encounter: Payer: Self-pay | Admitting: Urology

## 2023-02-17 NOTE — Patient Instructions (Signed)

## 2023-03-03 ENCOUNTER — Encounter: Payer: Self-pay | Admitting: Internal Medicine

## 2023-03-03 ENCOUNTER — Ambulatory Visit: Payer: Medicare Other | Admitting: Internal Medicine

## 2023-03-03 VITALS — BP 124/71 | HR 101 | Temp 97.7°F | Ht 66.0 in | Wt 156.0 lb

## 2023-03-03 DIAGNOSIS — R1319 Other dysphagia: Secondary | ICD-10-CM

## 2023-03-03 DIAGNOSIS — R1310 Dysphagia, unspecified: Secondary | ICD-10-CM | POA: Diagnosis not present

## 2023-03-03 DIAGNOSIS — K219 Gastro-esophageal reflux disease without esophagitis: Secondary | ICD-10-CM | POA: Diagnosis not present

## 2023-03-03 DIAGNOSIS — K222 Esophageal obstruction: Secondary | ICD-10-CM | POA: Diagnosis not present

## 2023-03-03 MED ORDER — PANTOPRAZOLE SODIUM 40 MG PO TBEC: 40.0000 mg | DELAYED_RELEASE_TABLET | Freq: Every day | ORAL | 3 refills | Status: DC

## 2023-03-03 NOTE — Patient Instructions (Signed)
It was good to see you again today!  Decrease Protonix or pantoprazole to 40 mg once daily 30 minutes before breakfast.  Dispense 90 with 3 additional refills  Unless something comes up, plan to see you back in 1 year  You have mentioned some cold intolerance in your legs.  I definitely recommend you see Dr. Margo Aye regarding that ongoing problem.

## 2023-03-03 NOTE — Progress Notes (Unsigned)
Primary Care Physician:  Benita Stabile, MD Primary Gastroenterologist:  Dr. Jena Gauss  Pre-Procedure History & Physical: HPI:  Calvin Moreno is a 82 y.o. male here for follow-up of GERD and dysphagia.  History of Schatzki's ring found at recent EGD;  he was dilated.  Some unusual changes seen at the GE junction for which he was biopsied.  Nothing but reflux change found.  Swallowing better overall occasionally has some trouble with liquids.  He has been on twice daily Protonix.  No reflux symptoms.  He does not have any anorectal issues either at this time. History of radiation proctitis treated successfully with APC  Patient complains of some cold intolerance in the early morning hours he states he has a cold sweat more in the lower extremities than the rest of his body.  He is seeing Dr. Margo Aye for that issue.  Past Medical History:  Diagnosis Date   Aortic stenosis, mod 2021   And insufficiency, evaluation by Dr. Daleen Squibb in 2000   Arthritis    Benign prostatic hypertrophy    PSA of 1.39 in 09/2010   Chronic kidney disease    Borderline; creatinine of 1.6 in 08/2009, but 1.15 in 05/2010   Chronic sinusitis    By MRI   Diverticulosis 2013   GERD (gastroesophageal reflux disease)    Hearing impaired    Hemorrhoids 2006   Hiatal hernia 2006   HOH (hard of hearing)    Hyperlipidemia    Elevated triglycerides   Hypertension    Mild internal carotid artery plaque on MRI/MRA in 2002   Hypothyroidism    IBS (irritable bowel syndrome)    Insomnia    Left bundle branch block 2012   2012   Metabolic syndrome    Fasting hyperglycemia   Murmur    Nephrolithiasis 08/2009   2011   Obesity    Proctitis    radiation induced   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   s/p XRT   Seasonal allergies    Stroke (HCC)    TIA, no residual, seen on MRI   Tobacco abuse, in remission    60 pack years; quit in 1999   Uses hearing aid    bilateral    Past Surgical History:  Procedure Laterality Date    ANKLE FRACTURE SURGERY     left   BIOPSY  12/31/2022   Procedure: BIOPSY;  Surgeon: Corbin Ade, MD;  [facility];  Service: Endoscopy;;   CATARACT EXTRACTION Left    CATARACT EXTRACTION W/PHACO Right 12/19/2013   Procedure: CATARACT EXTRACTION PHACO AND INTRAOCULAR LENS PLACEMENT RIGHT EYE CDE=14.78;  Surgeon: Gemma Payor, MD;  Location: [facility];  Service: Ophthalmology;  Laterality: Right;   CHOLECYSTECTOMY     COLONOSCOPY  2006   Dr. Henry Russel mucosa throughout colon, moderate internal hemorrhoids. bx= benign colonic mucosa   COLONOSCOPY  12/03/2011   Dr. Jena Gauss- colonic diverticulosis, radiation-induced proctitis- s/p APC ablation, no microscopic colitis on bx   COLONOSCOPY WITH PROPOFOL N/A 06/12/2020   Procedure: COLONOSCOPY WITH PROPOFOL;  Surgeon: Corbin Ade, MD;  [facility];  Service: Endoscopy;  Laterality: N/A;  2:45pm   ESOPHAGOGASTRODUODENOSCOPY  2006   Dr. Randa Evens- small hiatal hernia and slightly reddened distal esophagus o/w normal   ESOPHAGOGASTRODUODENOSCOPY (EGD) WITH PROPOFOL N/A 12/31/2022   Procedure: ESOPHAGOGASTRODUODENOSCOPY (EGD) WITH PROPOFOL;  Surgeon: Corbin Ade, MD;  [facility];  Service: Endoscopy;  Laterality: N/A;  1245pm, asa 3  FLEXIBLE SIGMOIDOSCOPY N/A 07/13/2013   [id number] changes of the rectum with active oozing consistent with chronic radiation proctitis.  Otherwise, negative sigmoidoscopy to 40 cm.Status post argon plasma coagulation ablation    HOT HEMOSTASIS N/A 07/13/2013   Procedure: HOT HEMOSTASIS (ARGON PLASMA COAGULATION/BICAP);  Surgeon: Corbin Ade, MD;  [facility];  Service: Endoscopy;  Laterality: N/A;   Ileocolonoscopy  12/03/2011   [id number] diverticulosis. Radiation-induced proctitis status post APC ablation. Status post segmental colon Biopsy   MALONEY DILATION N/A 12/31/2022   Procedure: MALONEY DILATION;  Surgeon: Corbin Ade, MD;  [facility];   Service: Endoscopy;  Laterality: N/A;   TONSILLECTOMY      Prior to Admission medications   Medication Sig Start Date End Date Taking? Authorizing Provider  acyclovir (ZOVIRAX) 800 MG tablet Take 800 mg by mouth See admin instructions. Take 800 mg daily, increase to 800 mg 3 times daily as needed when having a fever blister flare   Yes [provider]  ALPRAZolam (XANAX) 0.5 MG tablet Take 0.5 mg by mouth at bedtime as needed for sleep.   Yes [provider]  Ascorbic Acid (VITAMIN C) 1000 MG tablet Take 1,000 mg by mouth daily.   Yes [provider]  aspirin EC 81 MG tablet Take 81 mg by mouth at bedtime.   Yes [provider]  clotrimazole-betamethasone (LOTRISONE) cream SMARTSIG:Topical Morning-Evening 02/17/23  Yes [provider]  finasteride (PROSCAR) 5 MG tablet Take 1 tablet (5 mg total) by mouth daily. 02/11/23  Yes McKenzie, Mardene Celeste, MD  fluorometholone (FML) 0.1 % ophthalmic suspension SMARTSIG:In Eye(s) 11/26/22  Yes [provider]  fluticasone (CUTIVATE) 0.05 % cream SMARTSIG:Topical 1-2 Times Daily PRN 12/10/22  Yes [provider]  furosemide (LASIX) 40 MG tablet Take 40 mg by mouth daily. 12/10/21  Yes [provider]  gabapentin (NEURONTIN) 100 MG capsule Take by mouth. 09/09/22  Yes [provider]  HYDROcodone-acetaminophen (NORCO) 10-325 MG tablet Take 1 tablet by mouth 4 (four) times daily as needed. 11/26/22  Yes [provider]  levothyroxine (SYNTHROID) 25 MCG tablet Take 25 mcg by mouth daily. 10/24/22  Yes [provider]  Melatonin 3 MG TABS Take 3 mg by mouth at bedtime. For sleep   Yes [provider]  pantoprazole (PROTONIX) 40 MG tablet Take 1 tablet (40 mg total) by mouth 2 (two) times daily before a meal. 11/28/22  Yes Lillard Bailon, Gerrit Friends, MD  Polyethyl Glycol-Propyl Glycol (SYSTANE OP) Place 1 drop into both eyes 2 (two) times daily as needed (dry eyes).   Yes [provider]  potassium chloride (KLOR-CON M) 10 MEQ tablet Take 1 tablet (10 mEq total) by mouth daily. 12/27/22  Yes Johnson, Clanford L, MD  silodosin (RAPAFLO) 8 MG CAPS capsule Take 1 capsule (8 mg total) by mouth 2 (two) times daily. 02/11/23  Yes McKenzie, Mardene Celeste, MD  simvastatin (ZOCOR) 20 MG tablet Take 20 mg by mouth every evening.   Yes [provider]  vitamin B-12 (CYANOCOBALAMIN) 1000 MCG tablet Take 2,000 mcg by mouth daily.   Yes [provider]    Allergies as of 03/03/2023 - Review Complete 03/03/2023  Allergen Reaction Noted   Norvasc [amlodipine besylate] Swelling 09/19/2014   Codeine Nausea And Vomiting 11/06/2011   Amoxicillin Rash 12/16/2021    Family History  Problem Relation Age of Onset   Heart failure Mother    Heart attack Father    Heart failure  Brother        bladder cancer (agent orange exposure)   Cirrhosis Brother        chronic viral hepatitis   Colon cancer Neg Hx     Social History   Socioeconomic History   Marital status: Married    Spouse name: Not on file   Number of children: 2   Years of education: Not on file   Highest education level: Not on file  Occupational History   Occupation: Retired    Associate Professor: [employer]  Tobacco Use   Smoking status: Former    Current packs/day: 0.00    Average packs/day: 1.5 packs/day for 40.0 years (60.0 ttl pk-yrs)    Types: Cigarettes    Start date: 11/04/1957    Quit date: 11/04/1997    Years since quitting: 25.3    Passive exposure: Never   Smokeless tobacco: Never   Tobacco comments:    Quit in 1999  Vaping Use   Vaping status: Never Used  Substance and Sexual Activity   Alcohol use: No    Alcohol/week: 0.0 standard drinks of alcohol   Drug use: No   Sexual activity: Yes  Other Topics Concern   Not on file  Social History Narrative   Married with 2 children   Lives locally   No regular exercise   Social Determinants of Health   Financial Resource  Strain: Not on file  Food Insecurity: Not on file  Transportation Needs: Not on file  Physical Activity: Not on file  Stress: Not on file  Social Connections: Not on file  Intimate Partner Violence: Not on file    Review of Systems: See HPI, otherwise negative ROS  Physical Exam: BP 124/71 (BP Location: Right Arm, Patient Position: Sitting, Cuff Size: Normal)   Pulse (!) 101   Temp 97.7 F (36.5 C) (Oral)   Ht 5\' 6"  (1.676 m)   Wt 156 lb (70.8 kg)   SpO2 95%   BMI 25.18 kg/m  General:   Alert,  Well-developed, well-nourished, pleasant and cooperative in NAD Neck:  Supple; no masses or thyromegaly. No significant cervical adenopathy. Lungs:  Clear throughout to auscultation.   No wheezes, crackles, or rhonchi. No acute distress. Heart:  Regular rate and rhythm; no murmurs, clicks, rubs,  or gallops. Abdomen: Non-distended, normal bowel sounds.  Soft and nontender without appreciable mass or hepatosplenomegaly.   Impression/Plan: 82 year old gentleman with esophageal dysphagia.  Found to have a Schatzki's ring recently which was dilated.  Reflux changes on EG junction biopsies.  Swallowing better post dilation.  Reflux well-controlled on Protonix.  I suspect we can de-escalate at this time.    He is having no anorectal symptoms  Recommendations:  Decrease Protonix or pantoprazole to 40 mg once daily 30 minutes before breakfast.  Dispense 90 with 3 additional refills  Unless something comes up, plan to see him in 1 year  Keep your follow-up appoint with Dr. Margo Aye regarding your non-GI symptoms.      Notice: This dictation was prepared with Dragon dictation along with smaller phrase technology. Any transcriptional errors that result from this process are unintentional and may not be corrected upon review.

## 2023-03-05 ENCOUNTER — Other Ambulatory Visit (HOSPITAL_COMMUNITY): Payer: Self-pay | Admitting: Family Medicine

## 2023-03-05 ENCOUNTER — Ambulatory Visit (HOSPITAL_COMMUNITY)
Admission: RE | Admit: 2023-03-05 | Discharge: 2023-03-05 | Disposition: A | Discharge status: Home / Self Care | Payer: Medicare Other | Source: Ambulatory Visit | Attending: Family Medicine | Admitting: Family Medicine

## 2023-03-05 DIAGNOSIS — R61 Generalized hyperhidrosis: Secondary | ICD-10-CM | POA: Insufficient documentation

## 2023-03-11 ENCOUNTER — Ambulatory Visit (HOSPITAL_COMMUNITY)
Admission: RE | Admit: 2023-03-11 | Discharge: 2023-03-11 | Disposition: A | Discharge status: Home / Self Care | Payer: Medicare Other | Source: Ambulatory Visit | Attending: Cardiology | Admitting: Cardiology

## 2023-03-11 DIAGNOSIS — I350 Nonrheumatic aortic (valve) stenosis: Secondary | ICD-10-CM | POA: Insufficient documentation

## 2023-03-11 LAB — ECHOCARDIOGRAM COMPLETE
AR max vel: 0.9 cm2
AV Area VTI: 0.93 cm2
AV Area mean vel: 0.92 cm2
AV Mean grad: 39 mm[Hg]
AV Peak grad: 50.1 mm[Hg]
Ao pk vel: 3.54 m/s
Area-P 1/2: 2.2 cm2
P 1/2 time: 460 ms
S' Lateral: 2 cm

## 2023-03-11 NOTE — Progress Notes (Signed)
*  PRELIMINARY RESULTS* Echocardiogram 2D Echocardiogram has been performed.  Stacey Drain 03/11/2023, 11:30 AM

## 2023-03-24 ENCOUNTER — Emergency Department (HOSPITAL_COMMUNITY)
Admission: EM | Admit: 2023-03-24 | Discharge: 2023-03-24 | Discharge status: Against Medical Advice | Payer: Medicare Other | Attending: Emergency Medicine | Admitting: Emergency Medicine

## 2023-03-24 ENCOUNTER — Other Ambulatory Visit: Payer: Self-pay

## 2023-03-24 ENCOUNTER — Emergency Department (HOSPITAL_COMMUNITY): Payer: Medicare Other

## 2023-03-24 ENCOUNTER — Encounter (HOSPITAL_COMMUNITY): Payer: Self-pay

## 2023-03-24 DIAGNOSIS — W19XXXA Unspecified fall, initial encounter: Secondary | ICD-10-CM | POA: Diagnosis not present

## 2023-03-24 DIAGNOSIS — Z7982 Long term (current) use of aspirin: Secondary | ICD-10-CM | POA: Insufficient documentation

## 2023-03-24 DIAGNOSIS — S32040A Wedge compression fracture of fourth lumbar vertebra, initial encounter for closed fracture: Secondary | ICD-10-CM

## 2023-03-24 DIAGNOSIS — S32049A Unspecified fracture of fourth lumbar vertebra, initial encounter for closed fracture: Secondary | ICD-10-CM | POA: Insufficient documentation

## 2023-03-24 DIAGNOSIS — M25552 Pain in left hip: Secondary | ICD-10-CM | POA: Insufficient documentation

## 2023-03-24 DIAGNOSIS — K5900 Constipation, unspecified: Secondary | ICD-10-CM | POA: Diagnosis not present

## 2023-03-24 DIAGNOSIS — S3992XA Unspecified injury of lower back, initial encounter: Secondary | ICD-10-CM | POA: Diagnosis present

## 2023-03-24 MED ORDER — FLEET ENEMA RE ENEM
1.0000 | ENEMA | Freq: Once | RECTAL | Status: AC
  Administered 2023-03-24: 1 via RECTAL

## 2023-03-24 MED ORDER — LIDOCAINE 5 % EX PTCH
1.0000 | MEDICATED_PATCH | CUTANEOUS | Status: DC
  Administered 2023-03-24: 1 via TRANSDERMAL
  Filled 2023-03-24: qty 1

## 2023-03-24 NOTE — ED Notes (Signed)
Pt requesting to leave. PA made aware. Pt becoming agitated w/ wait for CT scan and requesting to leave AMA. Pt educated on risks of leaving and encouraged to stay. Pt refused and wheeled out of ED.

## 2023-03-24 NOTE — ED Notes (Signed)
Pt had aprox 12, inch long nuggets for a bm, pt states that he feels a little bit better and doesn't need to have a bm at this time.

## 2023-03-24 NOTE — ED Notes (Signed)
Soaps suds enema complete, pt instructed to relax and allow the warm soapy water to help soften his stool and allow for a bm.

## 2023-03-24 NOTE — ED Notes (Signed)
Pt had minimal success with fleet enema, pa notified

## 2023-03-24 NOTE — ED Triage Notes (Signed)
Pt to er via ems, per ems pt is here for a fall and some lower back pain. Pt states that he fell yesterday and the day before, states that his legs gave out, states that normally he has some chronic hip pain, but now he also has some lower back pain. Pt states that he takes hydrocodone for his pain and xanax for his anxiety.

## 2023-03-24 NOTE — ED Provider Notes (Signed)
[place] EMERGENCY DEPARTMENT AT [facility] Provider Note   CSN: [id number] Arrival date & time: 03/24/23  1147     History {Add pertinent medical, surgical, social history, OB history to HPI:1} Chief Complaint  Patient presents with  . Back Pain    Calvin Moreno is a 82 y.o. male.  Patient reports that he fell yesterday and fell the day before.  Patient complains of pain in his back.  Patient also has some soreness in his left hip.  Patient reports he was able to stand and walk after the falls.  Patient complains of pain in his back and also complains of severe constipation patient reports he takes hydrocodone and he has not been able to have a bowel movement.  Patient reports he has been straining for several days trying to have a bowel movement.  The history is provided by the patient. No language interpreter was used.  Back Pain      Home Medications Prior to Admission medications   Medication Sig Start Date End Date Taking? Authorizing Provider  acyclovir (ZOVIRAX) 800 MG tablet Take 800 mg by mouth See admin instructions. Take 800 mg daily, increase to 800 mg 3 times daily as needed when having a fever blister flare    [provider]  ALPRAZolam (XANAX) 0.5 MG tablet Take 0.5 mg by mouth at bedtime as needed for sleep.    [provider]  Ascorbic Acid (VITAMIN C) 1000 MG tablet Take 1,000 mg by mouth daily.    [provider]  aspirin EC 81 MG tablet Take 81 mg by mouth at bedtime.    [provider]  clotrimazole-betamethasone (LOTRISONE) cream SMARTSIG:Topical Morning-Evening 02/17/23   [provider]  finasteride (PROSCAR) 5 MG tablet Take 1 tablet (5 mg total) by mouth daily. 02/11/23   McKenzie, Mardene Celeste, MD  fluorometholone (FML) 0.1 % ophthalmic suspension SMARTSIG:In Eye(s) 11/26/22   [provider]  fluticasone (CUTIVATE) 0.05 % cream SMARTSIG:Topical 1-2 Times Daily PRN 12/10/22   [provider]  furosemide (LASIX) 40 MG tablet Take 40 mg by mouth daily. 12/10/21   [provider]  gabapentin (NEURONTIN) 100 MG capsule Take by mouth. 09/09/22   [provider]  HYDROcodone-acetaminophen (NORCO) 10-325 MG tablet Take 1 tablet by mouth 4 (four) times daily as needed. 11/26/22   [provider]  levothyroxine (SYNTHROID) 25 MCG tablet Take 25 mcg by mouth daily. 10/24/22   [provider]  Melatonin 3 MG TABS Take 3 mg by mouth at bedtime. For sleep    [provider]  pantoprazole (PROTONIX) 40 MG tablet Take 1 tablet (40 mg total) by mouth daily before breakfast. 03/03/23   Rourk, Gerrit Friends, MD  Polyethyl Glycol-Propyl Glycol (SYSTANE OP) Place 1 drop into both eyes 2 (two) times daily as needed (dry eyes).    [provider]  potassium chloride (KLOR-CON M) 10 MEQ tablet Take 1 tablet (10 mEq total) by mouth daily. 12/27/22   Johnson, Clanford L, MD  silodosin (RAPAFLO) 8 MG CAPS capsule Take 1 capsule (8 mg total) by mouth 2 (two) times daily. 02/11/23   McKenzie, Mardene Celeste, MD  simvastatin (ZOCOR) 20 MG tablet Take 20 mg by mouth every evening.    [provider]  vitamin B-12 (CYANOCOBALAMIN) 1000 MCG tablet Take 2,000 mcg by mouth daily.    [provider]      Allergies    Norvasc [amlodipine besylate], Codeine, and Amoxicillin  Review of Systems   Review of Systems  Gastrointestinal:  Positive for constipation.  Musculoskeletal:  Positive for back pain.  All other systems reviewed and are negative.   Physical Exam Updated Vital Signs BP 125/83 (BP Location: Left Arm)   Pulse (!) 117   Temp 97.9 F (36.6 C) (Oral)   Resp 17   Ht 5\' 6"  (1.676 m)   Wt 71.7 kg   SpO2 99%   BMI 25.50 kg/m  Physical Exam Vitals and nursing note reviewed.  Constitutional:      Appearance: He is well-developed.  HENT:     Head: Normocephalic.     Nose: Nose normal.  Cardiovascular:     Rate and Rhythm:  Normal rate.     Pulses: Normal pulses.  Pulmonary:     Effort: Pulmonary effort is normal.  Abdominal:     General: Abdomen is flat. There is no distension.  Musculoskeletal:     Cervical back: Normal range of motion.     Comments: Tender lower lumbar spine, pain with range of motion.  Skin:    General: Skin is warm.  Neurological:     General: No focal deficit present.     Mental Status: He is alert and oriented to person, place, and time.  Psychiatric:        Mood and Affect: Mood normal.    ED Results / Procedures / Treatments   Labs (all labs ordered are listed, but only abnormal results are displayed) Labs Reviewed - No data to display  EKG None  Radiology DG Lumbar Spine Complete  Result Date: 03/24/2023 CLINICAL DATA:  Fall with back pain. EXAM: LUMBAR SPINE - COMPLETE 4+ VIEW COMPARISON:  Lumbar radiograph 07/02/2021, MRI 08/21/2021 FINDINGS: The bones are subjectively under mineralized. Moderate L4 compression deformity with at least 30% loss of height of the superior endplate. This is age indeterminate but new from prior exam. No additional fracture or compression deformity. The alignment is normal. Anterior spurring at multiple levels. Side L3-L4 disc space narrowing. Lower lumbar facet hypertrophy. Sacroiliac joints are congruent. IMPRESSION: 1. Moderate L4 compression deformity, age indeterminate but new from May 2023. 2. Mild degenerative disc disease and facet hypertrophy. Electronically Signed   By: Narda Rutherford M.D.   On: 03/24/2023 17:16   DG Pelvis 1-2 Views  Result Date: 03/24/2023 CLINICAL DATA:  Status post fall with pain. EXAM: PELVIS - 1-2 VIEW COMPARISON:  Pelvis and left hip radiograph 11/06/2022 FINDINGS: The bones are subjectively under mineralized. The cortical margins of the bony pelvis are intact. No fracture. Pubic symphysis and sacroiliac joints are congruent. Both femoral heads are well-seated in the respective acetabula. Mild bilateral hip  osteoarthritis, without significant interval change. IMPRESSION: 1. No pelvic fracture. 2. Mild bilateral hip osteoarthritis. Electronically Signed   By: Narda Rutherford M.D.   On: 03/24/2023 17:11    Procedures Procedures  {Document cardiac monitor, telemetry assessment procedure when appropriate:1}  Medications Ordered in ED Medications  lidocaine (LIDODERM) 5 % 1 patch (1 patch Transdermal Patch Applied 03/24/23 1459)  sodium phosphate (FLEET) enema 1 enema (1 enema Rectal Given 03/24/23 1625)    ED Course/ Medical Decision Making/ A&P   {   Click here for ABCD2, HEART and other calculatorsREFRESH Note before signing :1}                              Medical Decision Making Complains of pain in his  low back patient also complains of severe constipation from taking hydrocodone for pain.  Amount and/or Complexity of Data Reviewed Independent Historian: spouse    Details: Is here with his spouse who is supportive Radiology: ordered and independent interpretation performed. Decision-making details documented in ED Course.    Details: X-ray LS spine shows a probable new L4 compression fracture.  Risk OTC drugs. Prescription drug management. Risk Details: Patient has been on bedside commode and off for 3 hours straining attempting to have a bowel movement.  On patient's films he has a significant amount of constipation.  Patient was given a fleets enema with no relief.  Patient is complaining of severe constipation.  Was given a soapsuds enema. CT LS spine is ordered for further evaluation of lumbar compression fracture.     {Document critical care time when appropriate:1} {Document review of labs and clinical decision tools ie heart score, Chads2Vasc2 etc:1}  {Document your independent review of radiology images, and any outside records:1} {Document your discussion with family members, caretakers, and with consultants:1} {Document social determinants of health affecting pt's  care:1} {Document your decision making why or why not admission, treatments were needed:1} Final Clinical Impression(s) / ED Diagnoses Final diagnoses:  None    Rx / DC Orders ED Discharge Orders     None

## 2023-03-24 NOTE — ED Notes (Signed)
Pt in bed, pt answers questions appropriately, pt reports lower back pain, pt oriented to person and place, doesn't know day of the week or year, knows month.  Pt to and from bedside commode.

## 2023-03-24 NOTE — ED Notes (Signed)
Pt in bed, sig other at bedside, pt reports a slight decrease in pain, pt awaits ct.

## 2023-03-25 ENCOUNTER — Telehealth: Payer: Self-pay | Admitting: Orthopedic Surgery

## 2023-03-25 ENCOUNTER — Other Ambulatory Visit (HOSPITAL_COMMUNITY): Payer: Self-pay | Admitting: Family Medicine

## 2023-03-25 DIAGNOSIS — R911 Solitary pulmonary nodule: Secondary | ICD-10-CM

## 2023-03-25 NOTE — Telephone Encounter (Signed)
This patient's son [phone] called, stated that the patient went to the ED yesterday at [facility] for a back fx.  The patient left w/out getting d/c instructions.  Will you review his CT and notes to see if he should schedule here or not.  I don't know how severe it is.

## 2023-03-30 ENCOUNTER — Inpatient Hospital Stay (HOSPITAL_COMMUNITY): Payer: Medicare Other

## 2023-03-30 ENCOUNTER — Inpatient Hospital Stay (HOSPITAL_COMMUNITY)
Admission: EM | Admit: 2023-03-30 | Discharge: 2023-04-02 | DRG: 551 | Disposition: A | Discharge status: Skilled Nursing Facility | Payer: Medicare Other | Source: Ambulatory Visit | Attending: Internal Medicine | Admitting: Internal Medicine

## 2023-03-30 ENCOUNTER — Encounter (HOSPITAL_COMMUNITY): Payer: Self-pay

## 2023-03-30 ENCOUNTER — Other Ambulatory Visit: Payer: Self-pay

## 2023-03-30 DIAGNOSIS — R9431 Abnormal electrocardiogram [ECG] [EKG]: Secondary | ICD-10-CM | POA: Diagnosis present

## 2023-03-30 DIAGNOSIS — K219 Gastro-esophageal reflux disease without esophagitis: Secondary | ICD-10-CM | POA: Diagnosis present

## 2023-03-30 DIAGNOSIS — Z9049 Acquired absence of other specified parts of digestive tract: Secondary | ICD-10-CM

## 2023-03-30 DIAGNOSIS — K5521 Angiodysplasia of colon with hemorrhage: Secondary | ICD-10-CM | POA: Diagnosis present

## 2023-03-30 DIAGNOSIS — K625 Hemorrhage of anus and rectum: Secondary | ICD-10-CM | POA: Diagnosis not present

## 2023-03-30 DIAGNOSIS — N189 Chronic kidney disease, unspecified: Secondary | ICD-10-CM | POA: Diagnosis not present

## 2023-03-30 DIAGNOSIS — Z79899 Other long term (current) drug therapy: Secondary | ICD-10-CM

## 2023-03-30 DIAGNOSIS — Z8719 Personal history of other diseases of the digestive system: Secondary | ICD-10-CM | POA: Diagnosis not present

## 2023-03-30 DIAGNOSIS — S32041A Stable burst fracture of fourth lumbar vertebra, initial encounter for closed fracture: Principal | ICD-10-CM | POA: Diagnosis present

## 2023-03-30 DIAGNOSIS — Z8546 Personal history of malignant neoplasm of prostate: Secondary | ICD-10-CM

## 2023-03-30 DIAGNOSIS — Z87442 Personal history of urinary calculi: Secondary | ICD-10-CM

## 2023-03-30 DIAGNOSIS — Y92019 Unspecified place in single-family (private) house as the place of occurrence of the external cause: Secondary | ICD-10-CM

## 2023-03-30 DIAGNOSIS — E782 Mixed hyperlipidemia: Secondary | ICD-10-CM | POA: Diagnosis present

## 2023-03-30 DIAGNOSIS — R918 Other nonspecific abnormal finding of lung field: Secondary | ICD-10-CM | POA: Diagnosis present

## 2023-03-30 DIAGNOSIS — Z8673 Personal history of transient ischemic attack (TIA), and cerebral infarction without residual deficits: Secondary | ICD-10-CM | POA: Diagnosis not present

## 2023-03-30 DIAGNOSIS — Z880 Allergy status to penicillin: Secondary | ICD-10-CM

## 2023-03-30 DIAGNOSIS — H9190 Unspecified hearing loss, unspecified ear: Secondary | ICD-10-CM | POA: Diagnosis present

## 2023-03-30 DIAGNOSIS — K5520 Angiodysplasia of colon without hemorrhage: Secondary | ICD-10-CM | POA: Diagnosis not present

## 2023-03-30 DIAGNOSIS — R1013 Epigastric pain: Secondary | ICD-10-CM | POA: Diagnosis not present

## 2023-03-30 DIAGNOSIS — D72829 Elevated white blood cell count, unspecified: Secondary | ICD-10-CM | POA: Diagnosis not present

## 2023-03-30 DIAGNOSIS — Z8052 Family history of malignant neoplasm of bladder: Secondary | ICD-10-CM

## 2023-03-30 DIAGNOSIS — I129 Hypertensive chronic kidney disease with stage 1 through stage 4 chronic kidney disease, or unspecified chronic kidney disease: Secondary | ICD-10-CM | POA: Diagnosis present

## 2023-03-30 DIAGNOSIS — R933 Abnormal findings on diagnostic imaging of other parts of digestive tract: Secondary | ICD-10-CM | POA: Diagnosis not present

## 2023-03-30 DIAGNOSIS — M545 Low back pain, unspecified: Secondary | ICD-10-CM

## 2023-03-30 DIAGNOSIS — K5900 Constipation, unspecified: Secondary | ICD-10-CM | POA: Diagnosis present

## 2023-03-30 DIAGNOSIS — Z923 Personal history of irradiation: Secondary | ICD-10-CM | POA: Diagnosis not present

## 2023-03-30 DIAGNOSIS — S32001D Stable burst fracture of unspecified lumbar vertebra, subsequent encounter for fracture with routine healing: Secondary | ICD-10-CM | POA: Diagnosis not present

## 2023-03-30 DIAGNOSIS — E039 Hypothyroidism, unspecified: Secondary | ICD-10-CM | POA: Diagnosis present

## 2023-03-30 DIAGNOSIS — E876 Hypokalemia: Secondary | ICD-10-CM | POA: Diagnosis present

## 2023-03-30 DIAGNOSIS — Z8249 Family history of ischemic heart disease and other diseases of the circulatory system: Secondary | ICD-10-CM

## 2023-03-30 DIAGNOSIS — W19XXXA Unspecified fall, initial encounter: Secondary | ICD-10-CM | POA: Diagnosis not present

## 2023-03-30 DIAGNOSIS — Y92009 Unspecified place in unspecified non-institutional (private) residence as the place of occurrence of the external cause: Secondary | ICD-10-CM | POA: Diagnosis not present

## 2023-03-30 DIAGNOSIS — G4700 Insomnia, unspecified: Secondary | ICD-10-CM | POA: Diagnosis present

## 2023-03-30 DIAGNOSIS — Z7982 Long term (current) use of aspirin: Secondary | ICD-10-CM

## 2023-03-30 DIAGNOSIS — K529 Noninfective gastroenteritis and colitis, unspecified: Secondary | ICD-10-CM | POA: Diagnosis present

## 2023-03-30 DIAGNOSIS — Z87891 Personal history of nicotine dependence: Secondary | ICD-10-CM | POA: Diagnosis not present

## 2023-03-30 DIAGNOSIS — N400 Enlarged prostate without lower urinary tract symptoms: Secondary | ICD-10-CM | POA: Diagnosis present

## 2023-03-30 DIAGNOSIS — I10 Essential (primary) hypertension: Secondary | ICD-10-CM | POA: Diagnosis not present

## 2023-03-30 DIAGNOSIS — E46 Unspecified protein-calorie malnutrition: Secondary | ICD-10-CM

## 2023-03-30 DIAGNOSIS — M549 Dorsalgia, unspecified: Secondary | ICD-10-CM | POA: Diagnosis present

## 2023-03-30 DIAGNOSIS — Z885 Allergy status to narcotic agent status: Secondary | ICD-10-CM

## 2023-03-30 DIAGNOSIS — E8809 Other disorders of plasma-protein metabolism, not elsewhere classified: Secondary | ICD-10-CM | POA: Diagnosis present

## 2023-03-30 DIAGNOSIS — K5730 Diverticulosis of large intestine without perforation or abscess without bleeding: Secondary | ICD-10-CM | POA: Diagnosis not present

## 2023-03-30 DIAGNOSIS — K922 Gastrointestinal hemorrhage, unspecified: Secondary | ICD-10-CM | POA: Insufficient documentation

## 2023-03-30 DIAGNOSIS — W010XXA Fall on same level from slipping, tripping and stumbling without subsequent striking against object, initial encounter: Secondary | ICD-10-CM | POA: Diagnosis present

## 2023-03-30 DIAGNOSIS — K626 Ulcer of anus and rectum: Secondary | ICD-10-CM | POA: Diagnosis present

## 2023-03-30 DIAGNOSIS — Z888 Allergy status to other drugs, medicaments and biological substances status: Secondary | ICD-10-CM

## 2023-03-30 DIAGNOSIS — Z9181 History of falling: Secondary | ICD-10-CM

## 2023-03-30 DIAGNOSIS — S32001A Stable burst fracture of unspecified lumbar vertebra, initial encounter for closed fracture: Principal | ICD-10-CM | POA: Diagnosis present

## 2023-03-30 DIAGNOSIS — R1030 Lower abdominal pain, unspecified: Secondary | ICD-10-CM | POA: Diagnosis not present

## 2023-03-30 DIAGNOSIS — Z79890 Hormone replacement therapy (postmenopausal): Secondary | ICD-10-CM

## 2023-03-30 LAB — URINALYSIS, ROUTINE W REFLEX MICROSCOPIC
Bilirubin Urine: NEGATIVE
Glucose, UA: NEGATIVE mg/dL
Hgb urine dipstick: NEGATIVE
Ketones, ur: NEGATIVE mg/dL
Leukocytes,Ua: NEGATIVE
Nitrite: NEGATIVE
Protein, ur: NEGATIVE mg/dL
Specific Gravity, Urine: 1.023 (ref 1.005–1.030)
pH: 5 (ref 5.0–8.0)

## 2023-03-30 LAB — COMPREHENSIVE METABOLIC PANEL
ALT: 25 U/L (ref 0–44)
AST: 24 U/L (ref 15–41)
Albumin: 2.6 g/dL — ABNORMAL LOW (ref 3.5–5.0)
Alkaline Phosphatase: 132 U/L — ABNORMAL HIGH (ref 38–126)
Anion gap: 12 (ref 5–15)
BUN: 18 mg/dL (ref 8–23)
CO2: 25 mmol/L (ref 22–32)
Calcium: 8.3 mg/dL — ABNORMAL LOW (ref 8.9–10.3)
Chloride: 101 mmol/L (ref 98–111)
Creatinine, Ser: 0.93 mg/dL (ref 0.61–1.24)
GFR, Estimated: >60 mL/min (ref >60)
Glucose, Bld: 131 mg/dL — ABNORMAL HIGH (ref 70–99)
Potassium: 3 mmol/L — ABNORMAL LOW (ref 3.5–5.1)
Sodium: 138 mmol/L (ref 135–145)
Total Bilirubin: 0.9 mg/dL (ref <1.2)
Total Protein: 5.9 g/dL — ABNORMAL LOW (ref 6.5–8.1)

## 2023-03-30 LAB — CBC WITH DIFFERENTIAL/PLATELET
Abs Immature Granulocytes: 0.09 10*3/uL — ABNORMAL HIGH (ref 0.00–0.07)
Basophils Absolute: 0 10*3/uL (ref 0.0–0.1)
Basophils Relative: 0 %
Eosinophils Absolute: 0.2 10*3/uL (ref 0.0–0.5)
Eosinophils Relative: 1 %
HCT: 40.1 % (ref 39.0–52.0)
Hemoglobin: 13.5 g/dL (ref 13.0–17.0)
Immature Granulocytes: 1 %
Lymphocytes Relative: 7 %
Lymphs Abs: 1 10*3/uL (ref 0.7–4.0)
MCH: 30 pg (ref 26.0–34.0)
MCHC: 33.7 g/dL (ref 30.0–36.0)
MCV: 89.1 fL (ref 80.0–100.0)
Monocytes Absolute: 0.7 10*3/uL (ref 0.1–1.0)
Monocytes Relative: 5 %
Neutro Abs: 12.8 10*3/uL — ABNORMAL HIGH (ref 1.7–7.7)
Neutrophils Relative %: 86 %
Platelets: 369 10*3/uL (ref 150–400)
RBC: 4.5 MIL/uL (ref 4.22–5.81)
RDW: 12.7 % (ref 11.5–15.5)
WBC: 14.8 10*3/uL — ABNORMAL HIGH (ref 4.0–10.5)
nRBC: 0 % (ref 0.0–0.2)

## 2023-03-30 LAB — OCCULT BLOOD X 1 CARD TO LAB, STOOL: Fecal Occult Bld: POSITIVE — AB

## 2023-03-30 MED ORDER — FINASTERIDE 5 MG PO TABS: 5.0000 mg | ORAL_TABLET | Freq: Every day | ORAL | Status: DC

## 2023-03-30 MED ORDER — POTASSIUM CHLORIDE CRYS ER 20 MEQ PO TBCR: 20.0000 meq | EXTENDED_RELEASE_TABLET | Freq: Once | ORAL | Status: DC

## 2023-03-30 MED ORDER — LEVOTHYROXINE SODIUM 25 MCG PO TABS
25.0000 ug | ORAL_TABLET | Freq: Every day | ORAL | Status: DC
Start: 2023-03-31 — End: 2023-04-02
  Administered 2023-03-31 – 2023-04-02 (×3): 25 ug via ORAL
  Filled 2023-03-30 (×3): qty 1

## 2023-03-30 MED ORDER — SIMVASTATIN 20 MG PO TABS
20.0000 mg | ORAL_TABLET | Freq: Every evening | ORAL | Status: DC
  Administered 2023-03-30: 20 mg via ORAL
  Filled 2023-03-30: qty 1

## 2023-03-30 MED ORDER — ACETAMINOPHEN 650 MG RE SUPP: 650.0000 mg | Freq: Four times a day (QID) | RECTAL | Status: DC | PRN

## 2023-03-30 MED ORDER — FUROSEMIDE 40 MG PO TABS: 40.0000 mg | ORAL_TABLET | Freq: Every day | ORAL | Status: DC

## 2023-03-30 MED ORDER — HYDROCODONE-ACETAMINOPHEN 5-325 MG PO TABS
1.0000 | ORAL_TABLET | Freq: Once | ORAL | Status: DC
  Filled 2023-03-30: qty 1

## 2023-03-30 MED ORDER — TRAMADOL HCL 50 MG PO TABS: 50.0000 mg | ORAL_TABLET | Freq: Four times a day (QID) | ORAL | Status: DC | PRN

## 2023-03-30 MED ORDER — MELATONIN 3 MG PO TABS
3.0000 mg | ORAL_TABLET | Freq: Every day | ORAL | Status: DC
  Administered 2023-03-30 – 2023-04-01 (×3): 3 mg via ORAL
  Filled 2023-03-30 (×3): qty 1

## 2023-03-30 MED ORDER — FINASTERIDE 5 MG PO TABS
5.0000 mg | ORAL_TABLET | Freq: Every day | ORAL | Status: DC
  Administered 2023-03-31: 5 mg via ORAL
  Filled 2023-03-30 (×2): qty 1

## 2023-03-30 MED ORDER — DOCUSATE SODIUM 100 MG PO CAPS
100.0000 mg | ORAL_CAPSULE | Freq: Once | ORAL | Status: DC
Start: 2023-03-30 — End: 2023-04-02
  Filled 2023-03-30: qty 1

## 2023-03-30 MED ORDER — PANTOPRAZOLE SODIUM 40 MG IV SOLR
40.0000 mg | Freq: Two times a day (BID) | INTRAVENOUS | Status: DC
  Administered 2023-03-30 – 2023-04-01 (×5): 40 mg via INTRAVENOUS
  Filled 2023-03-30 (×5): qty 10

## 2023-03-30 MED ORDER — TAMSULOSIN HCL 0.4 MG PO CAPS
0.4000 mg | ORAL_CAPSULE | Freq: Every day | ORAL | Status: DC
  Administered 2023-03-31 – 2023-04-01 (×2): 0.4 mg via ORAL
  Filled 2023-03-30 (×2): qty 1

## 2023-03-30 MED ORDER — TRAMADOL HCL 50 MG PO TABS
50.0000 mg | ORAL_TABLET | Freq: Once | ORAL | Status: AC
  Administered 2023-03-30: 50 mg via ORAL
  Filled 2023-03-30: qty 1

## 2023-03-30 MED ORDER — ENSURE ENLIVE PO LIQD
237.0000 mL | Freq: Two times a day (BID) | ORAL | Status: DC
  Filled 2023-03-30 (×2): qty 237

## 2023-03-30 MED ORDER — LEVOTHYROXINE SODIUM 50 MCG PO TABS: 25.0000 ug | ORAL_TABLET | Freq: Every day | ORAL | Status: DC

## 2023-03-30 MED ORDER — ACETAMINOPHEN 325 MG PO TABS
650.0000 mg | ORAL_TABLET | Freq: Four times a day (QID) | ORAL | Status: DC | PRN
  Administered 2023-03-30 – 2023-03-31 (×2): 650 mg via ORAL
  Filled 2023-03-30 (×2): qty 2

## 2023-03-30 MED ORDER — SODIUM CHLORIDE 0.9 % IV BOLUS
500.0000 mL | Freq: Once | INTRAVENOUS | Status: AC
  Administered 2023-03-30: 500 mL via INTRAVENOUS

## 2023-03-30 MED ORDER — ENOXAPARIN SODIUM 40 MG/0.4ML IJ SOSY: 40.0000 mg | PREFILLED_SYRINGE | INTRAMUSCULAR | Status: DC

## 2023-03-30 MED ORDER — DOCUSATE SODIUM 100 MG PO CAPS
100.0000 mg | ORAL_CAPSULE | Freq: Once | ORAL | Status: AC
  Administered 2023-03-30: 100 mg via ORAL
  Filled 2023-03-30: qty 1

## 2023-03-30 MED ORDER — PANTOPRAZOLE SODIUM 40 MG PO TBEC: 40.0000 mg | DELAYED_RELEASE_TABLET | Freq: Every day | ORAL | Status: DC

## 2023-03-30 MED ORDER — POTASSIUM CHLORIDE CRYS ER 20 MEQ PO TBCR
20.0000 meq | EXTENDED_RELEASE_TABLET | Freq: Once | ORAL | Status: AC
  Administered 2023-03-30: 20 meq via ORAL
  Filled 2023-03-30: qty 1

## 2023-03-30 MED ORDER — PROCHLORPERAZINE EDISYLATE 10 MG/2ML IJ SOLN: 10.0000 mg | Freq: Four times a day (QID) | INTRAMUSCULAR | Status: DC | PRN

## 2023-03-30 NOTE — ED Triage Notes (Signed)
Pt to er with family, states that pt was here last week for a fall, states that he didn't wait for the ct results, states that they saw the pmd and was told to come to the er for ct for a nodule on lung and pain control.

## 2023-03-30 NOTE — ED Notes (Signed)
Saltine crackers and peanut butter provided for pt.

## 2023-03-30 NOTE — H&P (Addendum)
History and Physical    Patient: Calvin Moreno ZOX:[id number] DOB: [birth year] DOA: 03/30/2023 DOS: the patient was seen and examined on 03/30/2023 PCP: Benita Stabile, MD  Patient coming from: Home  Chief Complaint:  Chief Complaint  Patient presents with   Fall   HPI: [name] is a 82 y.o. male with medical history significant of hypertension, hyperlipidemia, hypothyroidism, GERD, hearing impairment and history of stroke who presents to the emergency department after being asked to go to the ED for further evaluation and management by PCP due to CT evidence of L4 burst fracture.  He was seen in the ED on 10/29 after sustaining a fall at home, but he left AMA prior to completion of workup.  CT of the lumbar spine done at that time showed a burst fracture of L4.  He presented to PCP today and patient was sent to the ED for further pain control and possible discharge to a rehab. Patient believes that he fell due to accidental overdose of Norco which caused him to lose balance prior to the fall. Patient complained of back pain and family complained of progressive decline and loss of appetite per ED medical record.  He endorsed constipation, but denies chest pain, shortness of breath, nausea, vomiting, headache, fever, chills.  ED Course:  In the emergency department, patient was hemodynamically stable.  Workup in the ED showed normal CBC except for WBC of 14.8.  BMP was normal except for potassium of 3.0, blood glucose 131, albumin 2.6.  Urinalysis was normal.  FOBT was positive CT lumbar spine without contrast done on 03/24/2023 showed acute/subacute burst fracture of L4 with greater than 50% height loss and 4 mm retropulsion. Patient was treated with tramadol, Colace.  Potassium was replenished Milus Banister (PA for neurosurgery team) was consulted and recommended TLSO brace and outpatient follow-up during or after rehab per ED PA. Hospitalist was asked to admit patient for further  evaluation and management.  Review of Systems: Review of systems as noted in the HPI. All other systems reviewed and are negative.   Past Medical History:  Diagnosis Date   Aortic stenosis, mod 2021   And insufficiency, evaluation by Dr. Daleen Squibb in 2000   Arthritis    Benign prostatic hypertrophy    PSA of 1.39 in 09/2010   Chronic kidney disease    Borderline; creatinine of 1.6 in 08/2009, but 1.15 in 05/2010   Chronic sinusitis    By MRI   Diverticulosis 2013   GERD (gastroesophageal reflux disease)    Hearing impaired    Hemorrhoids 2006   Hiatal hernia 2006   HOH (hard of hearing)    Hyperlipidemia    Elevated triglycerides   Hypertension    Mild internal carotid artery plaque on MRI/MRA in 2002   Hypothyroidism    IBS (irritable bowel syndrome)    Insomnia    Left bundle branch block 2012   2012   Metabolic syndrome    Fasting hyperglycemia   Murmur    Nephrolithiasis 08/2009   2011   Obesity    Proctitis    radiation induced   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   s/p XRT   Seasonal allergies    Stroke (HCC)    TIA, no residual, seen on MRI   Tobacco abuse, in remission    60 pack years; quit in 1999   Uses hearing aid    bilateral   Past Surgical History:  Procedure Laterality Date   ANKLE FRACTURE  SURGERY     left   BIOPSY  12/31/2022   Procedure: BIOPSY;  Surgeon: Corbin Ade, MD;  Location: [facility];  Service: Endoscopy;;   CATARACT EXTRACTION Left    CATARACT EXTRACTION W/PHACO Right 12/19/2013   Procedure: CATARACT EXTRACTION PHACO AND INTRAOCULAR LENS PLACEMENT RIGHT EYE CDE=14.78;  Surgeon: Gemma Payor, MD;  Location: [facility] ORS;  Service: Ophthalmology;  Laterality: Right;   CHOLECYSTECTOMY     COLONOSCOPY  2006   Dr. Henry Russel mucosa throughout colon, moderate internal hemorrhoids. bx= benign colonic mucosa   COLONOSCOPY  12/03/2011   Dr. Jena Gauss- colonic diverticulosis, radiation-induced proctitis- s/p APC ablation, no microscopic colitis on bx    COLONOSCOPY WITH PROPOFOL N/A 06/12/2020   Procedure: COLONOSCOPY WITH PROPOFOL;  Surgeon: Corbin Ade, MD;  Location: [facility];  Service: Endoscopy;  Laterality: N/A;  2:45pm   ESOPHAGOGASTRODUODENOSCOPY  2006   Dr. Randa Evens- small hiatal hernia and slightly reddened distal esophagus o/w normal   ESOPHAGOGASTRODUODENOSCOPY (EGD) WITH PROPOFOL N/A 12/31/2022   Procedure: ESOPHAGOGASTRODUODENOSCOPY (EGD) WITH PROPOFOL;  Surgeon: Corbin Ade, MD;  Location: [facility];  Service: Endoscopy;  Laterality: N/A;  1245pm, asa 3   FLEXIBLE SIGMOIDOSCOPY N/A 07/13/2013   GLO:[id number] changes of the rectum with active oozing consistent with chronic radiation proctitis.  Otherwise, negative sigmoidoscopy to 40 cm.Status post argon plasma coagulation ablation    HOT HEMOSTASIS N/A 07/13/2013   Procedure: HOT HEMOSTASIS (ARGON PLASMA COAGULATION/BICAP);  Surgeon: Corbin Ade, MD;  Location: [facility];  Service: Endoscopy;  Laterality: N/A;   Ileocolonoscopy  12/03/2011   ACZ:YSAYTKZ diverticulosis. Radiation-induced proctitis status post APC ablation. Status post segmental colon Biopsy   MALONEY DILATION N/A 12/31/2022   Procedure: MALONEY DILATION;  Surgeon: Corbin Ade, MD;  Location: [facility];  Service: Endoscopy;  Laterality: N/A;   TONSILLECTOMY      Social History:  reports that he quit smoking about 25 years ago. His smoking use included cigarettes. He started smoking about 65 years ago. He has a 60 pack-year smoking history. He has never been exposed to tobacco smoke. He has never used smokeless tobacco. He reports that he does not drink alcohol and does not use drugs.   Allergies  Allergen Reactions   Norvasc [Amlodipine Besylate] Swelling    Ankle swelling   Codeine Nausea And Vomiting   Amoxicillin Rash    Family History  Problem Relation Age of Onset   Heart failure Mother    Heart attack Father    Heart failure Brother        bladder cancer (agent orange  exposure)   Cirrhosis Brother        chronic viral hepatitis   Colon cancer Neg Hx      Prior to Admission medications   Medication Sig Start Date End Date Taking? Authorizing Provider  acyclovir (ZOVIRAX) 800 MG tablet Take 800 mg by mouth See admin instructions. Take 800 mg daily, increase to 800 mg 3 times daily as needed when having a fever blister flare    [provider]  ALPRAZolam (XANAX) 0.5 MG tablet Take 0.5 mg by mouth at bedtime as needed for sleep.    [provider]  Ascorbic Acid (VITAMIN C) 1000 MG tablet Take 1,000 mg by mouth daily.    [provider]  aspirin EC 81 MG tablet Take 81 mg by mouth at bedtime.    [provider]  clotrimazole-betamethasone (LOTRISONE) cream SMARTSIG:Topical Morning-Evening 02/17/23  [provider]  finasteride (PROSCAR) 5 MG tablet Take 1 tablet (5 mg total) by mouth daily. 02/11/23   McKenzie, Mardene Celeste, MD  fluorometholone (FML) 0.1 % ophthalmic suspension SMARTSIG:In Eye(s) 11/26/22   [provider]  fluticasone (CUTIVATE) 0.05 % cream SMARTSIG:Topical 1-2 Times Daily PRN 12/10/22   [provider]  furosemide (LASIX) 40 MG tablet Take 40 mg by mouth daily. 12/10/21   [provider]  gabapentin (NEURONTIN) 100 MG capsule Take by mouth. 09/09/22   [provider]  HYDROcodone-acetaminophen (NORCO) 10-325 MG tablet Take 1 tablet by mouth 4 (four) times daily as needed. 11/26/22   [provider]  levothyroxine (SYNTHROID) 25 MCG tablet Take 25 mcg by mouth daily. 10/24/22   [provider]  Melatonin 3 MG TABS Take 3 mg by mouth at bedtime. For sleep    [provider]  pantoprazole (PROTONIX) 40 MG tablet Take 1 tablet (40 mg total) by mouth daily before breakfast. 03/03/23   Rourk, Gerrit Friends, MD  Polyethyl Glycol-Propyl Glycol (SYSTANE OP) Place 1 drop into both eyes 2 (two) times daily as needed (dry eyes).    [provider]   potassium chloride (KLOR-CON M) 10 MEQ tablet Take 1 tablet (10 mEq total) by mouth daily. 12/27/22   Johnson, Clanford L, MD  silodosin (RAPAFLO) 8 MG CAPS capsule Take 1 capsule (8 mg total) by mouth 2 (two) times daily. 02/11/23   McKenzie, Mardene Celeste, MD  simvastatin (ZOCOR) 20 MG tablet Take 20 mg by mouth every evening.    [provider]  vitamin B-12 (CYANOCOBALAMIN) 1000 MCG tablet Take 2,000 mcg by mouth daily.    [provider]    Physical Exam: BP (!) 99/56 (BP Location: Left Arm)   Pulse 80   Temp 98.5 F (36.9 C) (Oral)   Resp 16   Ht 5\' 6"  (1.676 m)   Wt 71.7 kg   SpO2 100%   BMI 25.50 kg/m   General: 82 y.o. year-old male well developed well nourished in no acute distress.  Alert and oriented x3. HEENT: NCAT, EOMI Neck: Supple, trachea medial Cardiovascular: Regular rate and rhythm with no rubs or gallops.  No thyromegaly or JVD noted.  No lower extremity edema. 2/4 pulses in all 4 extremities. Respiratory: Clear to auscultation with no wheezes or rales. Good inspiratory effort. Abdomen: Soft, nontender nondistended with normal bowel sounds x4 quadrants. Muskuloskeletal: No cyanosis, clubbing or edema noted bilaterally Neuro: CN II-XII intact, strength 5/5 x 4, sensation, reflexes intact Skin: No ulcerative lesions noted or rashes Psychiatry: Judgement and insight appear normal. Mood is appropriate for condition and setting          Labs on Admission:  Basic Metabolic Panel: Recent Labs  Lab 03/30/23 1817  NA 138  K 3.0*  CL 101  CO2 25  GLUCOSE 131*  BUN 18  CREATININE 0.93  CALCIUM 8.3*   Liver Function Tests: Recent Labs  Lab 03/30/23 1817  AST 24  ALT 25  ALKPHOS 132*  BILITOT 0.9  PROT 5.9*  ALBUMIN 2.6*   No results for input(s): "LIPASE", "AMYLASE" in the last 168 hours. No results for input(s): "AMMONIA" in the last 168 hours. CBC: Recent Labs  Lab 03/30/23 1817  WBC 14.8*  NEUTROABS 12.8*  HGB 13.5  HCT 40.1   MCV 89.1  PLT 369   Cardiac Enzymes: No results for input(s): "CKTOTAL", "CKMB", "CKMBINDEX", "TROPONINI" in the last 168 hours.  BNP (last 3 results)  No results for input(s): "BNP" in the last 8760 hours.  ProBNP (last 3 results) No results for input(s): "PROBNP" in the last 8760 hours.  CBG: No results for input(s): "GLUCAP" in the last 168 hours.  Radiological Exams on Admission: No results found.  EKG: I independently viewed the EKG done and my findings are as followed: Normal sinus rhythm at a rate of 90 bpm with APCs and QTc of 540 ms and LBBB  Assessment/Plan Present on Admission:  Lumbar burst fracture (HCC)  Hypokalemia  Prolonged QT interval  GERD (gastroesophageal reflux disease)  Essential hypertension  Mixed hyperlipidemia  INSOMNIA  BPH (benign prostatic hyperplasia)  Principal Problem:   Lumbar burst fracture (HCC) Active Problems:   INSOMNIA   Mixed hyperlipidemia   Essential hypertension   BPH (benign prostatic hyperplasia)   Hypokalemia   GERD (gastroesophageal reflux disease)   Prolonged QT interval   Fall at home, initial encounter   Acquired hypothyroidism   GI bleed  Lumbar burst fracture Fall at home Continue Tylenol as needed Continue fall precaution Continue PT/OT eval and treat Patient may require discharge to SNF prior to going home TOC will be consulted  Reported lung nodules Lung nodules was reported to be suspected by PCP and patient was sent to the ED for further imaging studies CT chest and CT of head pending  Hypokalemia K+ 3.0, this will be replenished  Prolonged QT interval QTc 514 ms Avoid QT prolonging drugs Magnesium level will be checked Repeat EKG in the morning  Leukocytosis possibly reactive WBC 14.8, continue to monitor WBC with morning labs  GI Bleed Patient reported bloody BMs since last Tuesday when he got an enema.  RN reported 2 small bright bloody BMs since he got to the floor.  Hemoglobin  stable at 13.5 FOBT was positive Patient will be placed n.p.o. at midnight Gastroenterology will be consulted  Hypoalbuminemia possibly secondary to moderate protein calorie malnutrition Albumin 2.6, protein supplement will be provided  Essential hypertension (controlled) Lasix will be temporarily held due to hypokalemia  Mixed hyperlipidemia Continue Zocor  Acquired hypothyroidism Continue Synthroid  GERD Continue Protonix  Insomnia Continue melatonin  BPH Continue Flomax and Proscar   DVT prophylaxis: SCDs  Code Status: Full code  Consults: TOC  Family Communication: None at bedside  Severity of Illness: The appropriate patient status for this patient is INPATIENT. Inpatient status is judged to be reasonable and necessary in order to provide the required intensity of service to ensure the patient's safety. The patient's presenting symptoms, physical exam findings, and initial radiographic and laboratory data in the context of their chronic comorbidities is felt to place them at high risk for further clinical deterioration. Furthermore, it is not anticipated that the patient will be medically stable for discharge from the hospital within 2 midnights of admission.   * I certify that at the point of admission it is my clinical judgment that the patient will require inpatient hospital care spanning beyond 2 midnights from the point of admission due to high intensity of service, high risk for further deterioration and high frequency of surveillance required.*  Author: Frankey Shown, DO 03/30/2023 11:57 PM  For on call review [email or web address].

## 2023-03-30 NOTE — ED Notes (Signed)
ED TO INPATIENT HANDOFF REPORT  ED Nurse Name and Phone #: Melanee Spry [phone]  S Name/Age/Gender Calvin Moreno 82 y.o. male Room/Bed: APA14/APA14  Code Status   Code Status: Prior  Home/SNF/Other Home Patient oriented to: self, place, time, and situation Is this baseline? Yes   Triage Complete: Triage complete  Chief Complaint Lumbar burst fracture (HCC) [S32.001A]  Triage Note Spoke with PA from dr hall office she wants pt to have CT of head/chest. She also stated pt's family would like him sent to a rehab facility.   Pt to er with family, states that pt was here last week for a fall, states that he didn't wait for the ct results, states that they saw the pmd and was told to come to the er for ct for a nodule on lung and pain control.     Allergies Allergies  Allergen Reactions   Norvasc [Amlodipine Besylate] Swelling    Ankle swelling   Codeine Nausea And Vomiting   Amoxicillin Rash    Level of Care/Admitting Diagnosis ED Disposition     ED Disposition  Admit   Condition  --   Comment  Hospital Area: [facility] [[id number]]  Level of Care: Med-Surg [16]  Covid Evaluation: Asymptomatic - no recent exposure (last 10 days) testing not required  Diagnosis: Lumbar burst fracture [facility]) [[id number]]  Admitting Physician: Frankey Shown [[id number]]  Attending Physician: Frankey Shown [[id number]]  Certification:: I certify this patient will need inpatient services for at least 2 midnights  Expected Medical Readiness: 04/02/2023          B Medical/Surgery History Past Medical History:  Diagnosis Date   Aortic stenosis, mod 2021   And insufficiency, evaluation by Dr. Daleen Squibb in 2000   Arthritis    Benign prostatic hypertrophy    PSA of 1.39 in 09/2010   Chronic kidney disease    Borderline; creatinine of 1.6 in 08/2009, but 1.15 in 05/2010   Chronic sinusitis    By MRI   Diverticulosis 2013   GERD (gastroesophageal reflux disease)    Hearing  impaired    Hemorrhoids 2006   Hiatal hernia 2006   HOH (hard of hearing)    Hyperlipidemia    Elevated triglycerides   Hypertension    Mild internal carotid artery plaque on MRI/MRA in 2002   Hypothyroidism    IBS (irritable bowel syndrome)    Insomnia    Left bundle branch block 2012   2012   Metabolic syndrome    Fasting hyperglycemia   Murmur    Nephrolithiasis 08/2009   2011   Obesity    Proctitis    radiation induced   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   s/p XRT   Seasonal allergies    Stroke (HCC)    TIA, no residual, seen on MRI   Tobacco abuse, in remission    60 pack years; quit in 1999   Uses hearing aid    bilateral   Past Surgical History:  Procedure Laterality Date   ANKLE FRACTURE SURGERY     left   BIOPSY  12/31/2022   Procedure: BIOPSY;  Surgeon: Corbin Ade, MD;  [facility];  Service: Endoscopy;;   CATARACT EXTRACTION Left    CATARACT EXTRACTION W/PHACO Right 12/19/2013   Procedure: CATARACT EXTRACTION PHACO AND INTRAOCULAR LENS PLACEMENT RIGHT EYE CDE=14.78;  Surgeon: Gemma Payor, MD;  Location: [facility];  Service: Ophthalmology;  Laterality: Right;   CHOLECYSTECTOMY     COLONOSCOPY  2006   Dr. Henry Russel mucosa throughout colon, moderate internal hemorrhoids. bx= benign colonic mucosa   COLONOSCOPY  12/03/2011   Dr. Jena Gauss- colonic diverticulosis, radiation-induced proctitis- s/p APC ablation, no microscopic colitis on bx   COLONOSCOPY WITH PROPOFOL N/A 06/12/2020   Procedure: COLONOSCOPY WITH PROPOFOL;  Surgeon: Corbin Ade, MD;  [facility]: [facility];  Service: Endoscopy;  Laterality: N/A;  2:45pm   ESOPHAGOGASTRODUODENOSCOPY  2006   Dr. Randa Evens- small hiatal hernia and slightly reddened distal esophagus o/w normal   ESOPHAGOGASTRODUODENOSCOPY (EGD) WITH PROPOFOL N/A 12/31/2022   Procedure: ESOPHAGOGASTRODUODENOSCOPY (EGD) WITH PROPOFOL;  Surgeon: Corbin Ade, MD;  [facility];  Service: Endoscopy;  Laterality: N/A;   1245pm, asa 3   FLEXIBLE SIGMOIDOSCOPY N/A 07/13/2013   BMW:[id number] changes of the rectum with active oozing consistent with chronic radiation proctitis.  Otherwise, negative sigmoidoscopy to 40 cm.Status post argon plasma coagulation ablation    HOT HEMOSTASIS N/A 07/13/2013   Procedure: HOT HEMOSTASIS (ARGON PLASMA COAGULATION/BICAP);  Surgeon: Corbin Ade, MD;  [facility];  Service: Endoscopy;  Laterality: N/A;   Ileocolonoscopy  12/03/2011   OZD:[id number] diverticulosis. Radiation-induced proctitis status post APC ablation. Status post segmental colon Biopsy   MALONEY DILATION N/A 12/31/2022   Procedure: MALONEY DILATION;  Surgeon: Corbin Ade, MD;  [facility];  Service: Endoscopy;  Laterality: N/A;   TONSILLECTOMY       A IV Location/Drains/Wounds Patient Lines/Drains/Airways Status     Active Line/Drains/Airways     None            Intake/Output Last 24 hours No intake or output data in the 24 hours ending 03/30/23 2001  Labs/Imaging Results for orders placed or performed during the hospital encounter of 03/30/23 (from the past 48 hour(s))  CBC with Differential     Status: Abnormal   Collection Time: 03/30/23  6:17 PM  Result Value Ref Range   WBC 14.8 (H) 4.0 - 10.5 K/uL   RBC 4.50 4.22 - 5.81 MIL/uL   Hemoglobin 13.5 13.0 - 17.0 g/dL   HCT 74.2 59.5 - 63.8 %   MCV 89.1 80.0 - 100.0 fL   MCH 30.0 26.0 - 34.0 pg   MCHC 33.7 30.0 - 36.0 g/dL   RDW 75.6 43.3 - 29.5 %   Platelets 369 150 - 400 K/uL   nRBC 0.0 0.0 - 0.2 %   Neutrophils Relative % 86 %   Neutro Abs 12.8 (H) 1.7 - 7.7 K/uL   Lymphocytes Relative 7 %   Lymphs Abs 1.0 0.7 - 4.0 K/uL   Monocytes Relative 5 %   Monocytes Absolute 0.7 0.1 - 1.0 K/uL   Eosinophils Relative 1 %   Eosinophils Absolute 0.2 0.0 - 0.5 K/uL   Basophils Relative 0 %   Basophils Absolute 0.0 0.0 - 0.1 K/uL   Immature Granulocytes 1 %   Abs Immature Granulocytes 0.09 (H) 0.00 - 0.07 K/uL     Comment: Performed at [facility], [address] [place], [place] [address]  Comprehensive metabolic panel     Status: Abnormal   Collection Time: 03/30/23  6:17 PM  Result Value Ref Range   Sodium 138 135 - 145 mmol/L   Potassium 3.0 (L) 3.5 - 5.1 mmol/L   Chloride 101 98 - 111 mmol/L   CO2 25 22 - 32 mmol/L   Glucose, Bld 131 (H) 70 - 99 mg/dL    Comment: Glucose reference range applies only to samples  taken after fasting for at least 8 hours.   BUN 18 8 - 23 mg/dL   Creatinine, Ser 1.61 0.61 - 1.24 mg/dL   Calcium 8.3 (L) 8.9 - 10.3 mg/dL   Total Protein 5.9 (L) 6.5 - 8.1 g/dL   Albumin 2.6 (L) 3.5 - 5.0 g/dL   AST 24 15 - 41 U/L   ALT 25 0 - 44 U/L   Alkaline Phosphatase 132 (H) 38 - 126 U/L   Total Bilirubin 0.9 <1.2 mg/dL   GFR, Estimated >09 >60 mL/min    Comment: (NOTE) Calculated using the CKD-EPI Creatinine Equation (2021)    Anion gap 12 5 - 15    Comment: Performed at [facility], [address] [place], [place] [address]  Urinalysis, Routine w reflex microscopic -Urine, Clean Catch     Status: None   Collection Time: 03/30/23  7:30 PM  Result Value Ref Range   Color, Urine YELLOW YELLOW   APPearance CLEAR CLEAR   Specific Gravity, Urine 1.023 1.005 - 1.030   pH 5.0 5.0 - 8.0   Glucose, UA NEGATIVE NEGATIVE mg/dL   Hgb urine dipstick NEGATIVE NEGATIVE   Bilirubin Urine NEGATIVE NEGATIVE   Ketones, ur NEGATIVE NEGATIVE mg/dL   Protein, ur NEGATIVE NEGATIVE mg/dL   Nitrite NEGATIVE NEGATIVE   Leukocytes,Ua NEGATIVE NEGATIVE    Comment: Performed at [facility], [address], [place], [place] [address]   No results found.  Pending Labs Unresulted Labs (From admission, onward)    None       Vitals/Pain Today's Vitals   03/30/23 1730 03/30/23 1748 03/30/23 1800 03/30/23 1944  BP: (!) 116/58  (!) 118/58   Pulse:      Resp:      Temp:      TempSrc:      SpO2:      Weight:      Height:      PainSc:  8   6     Isolation Precautions No active  isolations  Medications Medications  HYDROcodone-acetaminophen (NORCO/VICODIN) 5-325 MG per tablet 1 tablet (1 tablet Oral Not Given 03/30/23 1812)  docusate sodium (COLACE) capsule 100 mg (100 mg Oral Patient Refused/Not Given 03/30/23 1812)  traMADol (ULTRAM) tablet 50 mg (50 mg Oral Given 03/30/23 1843)  docusate sodium (COLACE) capsule 100 mg (100 mg Oral Given 03/30/23 1843)  potassium chloride SA (KLOR-CON M) CR tablet 20 mEq (20 mEq Oral Given 03/30/23 1951)    Mobility walks with device     Focused Assessments Musculoskeletal - closed burst fracture of the Lumbar verterbra    R Recommendations: See Admitting Provider Note  Report given to:   Additional Notes: N/A

## 2023-03-30 NOTE — ED Notes (Signed)
Urinal provided for pt and instructed for need for urine sample

## 2023-03-30 NOTE — ED Notes (Signed)
EDP made aware of pt refusing Colace and family's refusal for hydrocodone

## 2023-03-30 NOTE — ED Provider Notes (Signed)
[place] EMERGENCY DEPARTMENT AT [facility] Provider Note   CSN: [id number] Arrival date & time: 03/30/23  1627     History  Chief Complaint  Patient presents with   Calvin Moreno is a 81 y.o. male.  Patient to ED with daughter from PCP office. He was seen 10/29 in the ED after fall but left AMA prior to completion of the work up. He reported back pain from the fall and CT was done though he did not wait for the results. Review of the CT shows a burst fracture of L4. When seen in the office, he was sent here for further treatment of injuries, with request for other imaging, and possible admission for pain control and admission to rehab following discharge from the hospital. The patient and family report debilitating back pain causing loss of appetite and progressive decline. No confusion, fever, vomiting. He reports he feels he is constipated again.  The history is provided by the patient and a relative. No language interpreter was used.  Fall       Home Medications Prior to Admission medications   Medication Sig Start Date End Date Taking? Authorizing Provider  acyclovir (ZOVIRAX) 800 MG tablet Take 800 mg by mouth See admin instructions. Take 800 mg daily, increase to 800 mg 3 times daily as needed when having a fever blister flare    [provider]  ALPRAZolam (XANAX) 0.5 MG tablet Take 0.5 mg by mouth at bedtime as needed for sleep.    [provider]  Ascorbic Acid (VITAMIN C) 1000 MG tablet Take 1,000 mg by mouth daily.    [provider]  aspirin EC 81 MG tablet Take 81 mg by mouth at bedtime.    [provider]  clotrimazole-betamethasone (LOTRISONE) cream SMARTSIG:Topical Morning-Evening 02/17/23   [provider]  finasteride (PROSCAR) 5 MG tablet Take 1 tablet (5 mg total) by mouth daily. 02/11/23   McKenzie, Mardene Celeste, MD  fluorometholone (FML) 0.1 % ophthalmic suspension SMARTSIG:In Eye(s) 11/26/22    [provider]  fluticasone (CUTIVATE) 0.05 % cream SMARTSIG:Topical 1-2 Times Daily PRN 12/10/22   [provider]  furosemide (LASIX) 40 MG tablet Take 40 mg by mouth daily. 12/10/21   [provider]  gabapentin (NEURONTIN) 100 MG capsule Take by mouth. 09/09/22   [provider]  HYDROcodone-acetaminophen (NORCO) 10-325 MG tablet Take 1 tablet by mouth 4 (four) times daily as needed. 11/26/22   [provider]  levothyroxine (SYNTHROID) 25 MCG tablet Take 25 mcg by mouth daily. 10/24/22   [provider]  Melatonin 3 MG TABS Take 3 mg by mouth at bedtime. For sleep    [provider]  pantoprazole (PROTONIX) 40 MG tablet Take 1 tablet (40 mg total) by mouth daily before breakfast. 03/03/23   Rourk, Gerrit Friends, MD  Polyethyl Glycol-Propyl Glycol (SYSTANE OP) Place 1 drop into both eyes 2 (two) times daily as needed (dry eyes).    [provider]  potassium chloride (KLOR-CON M) 10 MEQ tablet Take 1 tablet (10 mEq total) by mouth daily. 12/27/22   Johnson, Clanford L, MD  silodosin (RAPAFLO) 8 MG CAPS capsule Take 1 capsule (8 mg total) by mouth 2 (two) times daily. 02/11/23   McKenzie, Mardene Celeste, MD  simvastatin (ZOCOR) 20 MG tablet Take 20 mg by mouth every evening.    [provider]  vitamin B-12 (CYANOCOBALAMIN) 1000 MCG tablet Take 2,000 mcg by mouth daily.  [provider]      Allergies    Norvasc [amlodipine besylate], Codeine, and Amoxicillin    Review of Systems   Review of Systems  Physical Exam Updated Vital Signs BP (!) 118/58   Pulse 84   Temp 98.8 F (37.1 C) (Oral)   Resp 18   Ht 5\' 6"  (1.676 m)   Wt 71.7 kg   SpO2 97%   BMI 25.50 kg/m  Physical Exam Vitals and nursing note reviewed.  Constitutional:      Appearance: Normal appearance. He is not toxic-appearing.  HENT:     Head: Atraumatic.  Cardiovascular:     Rate and Rhythm: Normal rate and regular rhythm.     Heart sounds:  Murmur heard.  Pulmonary:     Effort: Pulmonary effort is normal.     Breath sounds: No wheezing, rhonchi or rales.  Abdominal:     General: There is no distension.     Palpations: Abdomen is soft.     Tenderness: There is abdominal tenderness (Mildly tender diffusely.).  Musculoskeletal:        General: Normal range of motion.     Cervical back: Normal range of motion and neck supple.  Skin:    General: Skin is warm and dry.  Neurological:     General: No focal deficit present.     Mental Status: He is alert and oriented to person, place, and time.     GCS: GCS eye subscore is 4. GCS verbal subscore is 5. GCS motor subscore is 6.     Cranial Nerves: Cranial nerves 2-12 are intact. No dysarthria.     Sensory: Sensation is intact. No sensory deficit.     Motor: No weakness or abnormal muscle tone.     Deep Tendon Reflexes:     Reflex Scores:      Patellar reflexes are 1+ on the right side and 1+ on the left side.    ED Results / Procedures / Treatments   Labs (all labs ordered are listed, but only abnormal results are displayed) Labs Reviewed  CBC WITH DIFFERENTIAL/PLATELET - Abnormal; Notable for the following components:      Result Value   WBC 14.8 (*)    Neutro Abs 12.8 (*)    Abs Immature Granulocytes 0.09 (*)    All other components within normal limits  COMPREHENSIVE METABOLIC PANEL - Abnormal; Notable for the following components:   Potassium 3.0 (*)    Glucose, Bld 131 (*)    Calcium 8.3 (*)    Total Protein 5.9 (*)    Albumin 2.6 (*)    Alkaline Phosphatase 132 (*)    All other components within normal limits  URINALYSIS, ROUTINE W REFLEX MICROSCOPIC    EKG None  Radiology No results found. CT Lumbar Spine Wo Contrast  Result Date: 03/24/2023 CLINICAL DATA:  Fall EXAM: CT LUMBAR SPINE WITHOUT CONTRAST TECHNIQUE: Multidetector CT imaging of the lumbar spine was performed without intravenous contrast administration. Multiplanar CT image reconstructions  were also generated. RADIATION DOSE REDUCTION: This exam was performed according to the departmental dose-optimization program which includes automated exposure control, adjustment of the mA and/or kV according to patient size and/or use of iterative reconstruction technique. COMPARISON:  08/21/2021 lumbar spine MRI FINDINGS: Segmentation: 5 lumbar type vertebrae. Alignment: Normal. Vertebrae: Acute/subacute burst fracture of L4 with greater than 50% height loss. Retropulsion 4 mm. Paraspinal and other soft tissues: Calcific aortic atherosclerosis. Disc levels: No bony spinal canal stenosis. IMPRESSION: 1.  Acute/subacute burst fracture of L4 with greater than 50% height loss and 4 mm retropulsion. Aortic Atherosclerosis (ICD10-I70.0). Electronically Signed   By: Deatra Robinson M.D.   On: 03/24/2023 21:01   DG Lumbar Spine Complete  Result Date: 03/24/2023 CLINICAL DATA:  Fall with back pain. EXAM: LUMBAR SPINE - COMPLETE 4+ VIEW COMPARISON:  Lumbar radiograph 07/02/2021, MRI 08/21/2021 FINDINGS: The bones are subjectively under mineralized. Moderate L4 compression deformity with at least 30% loss of height of the superior endplate. This is age indeterminate but new from prior exam. No additional fracture or compression deformity. The alignment is normal. Anterior spurring at multiple levels. Side L3-L4 disc space narrowing. Lower lumbar facet hypertrophy. Sacroiliac joints are congruent. IMPRESSION: 1. Moderate L4 compression deformity, age indeterminate but new from May 2023. 2. Mild degenerative disc disease and facet hypertrophy. Electronically Signed   By: Narda Rutherford M.D.   On: 03/24/2023 17:16   DG Chest 2 View  Result Date: 03/24/2023 CLINICAL DATA:  generalized hyperhidrosis Shortness of breath for several months.  Cough. EXAM: CHEST - 2 VIEW COMPARISON:  02/23/2018 FINDINGS: Questionable 19 mm nodular opacity in the left upper lung zone, not seen on prior exam. Alternatively this may be  related to summation shadows. Minor bibasilar atelectasis. The heart is normal in size. Stable mediastinal contours, aortic atherosclerosis. No pulmonary edema, large pleural effusion or pneumothorax. Mild thoracic spondylosis. No acute osseous findings. IMPRESSION: 1. Questionable 19 mm nodular opacity in the left upper lung zone, not seen on prior exam. Alternatively this may be related to summation shadows. Recommend further evaluation with chest CT. 2. Minor bibasilar atelectasis. 3.  Aortic Atherosclerosis (ICD10-I70.0). Electronically Signed   By: Narda Rutherford M.D.   On: 03/24/2023 17:13   DG Pelvis 1-2 Views  Result Date: 03/24/2023 CLINICAL DATA:  Status post fall with pain. EXAM: PELVIS - 1-2 VIEW COMPARISON:  Pelvis and left hip radiograph 11/06/2022 FINDINGS: The bones are subjectively under mineralized. The cortical margins of the bony pelvis are intact. No fracture. Pubic symphysis and sacroiliac joints are congruent. Both femoral heads are well-seated in the respective acetabula. Mild bilateral hip osteoarthritis, without significant interval change. IMPRESSION: 1. No pelvic fracture. 2. Mild bilateral hip osteoarthritis. Electronically Signed   By: Narda Rutherford M.D.   On: 03/24/2023 17:11   ECHOCARDIOGRAM COMPLETE  Result Date: 03/11/2023    ECHOCARDIOGRAM REPORT   Patient Name:   GERMAINE SHENKER Date of Exam: 03/11/2023 Medical Rec #:  [id number]        Height:       66.0 in Accession #:    [id number]       Weight:       156.0 lb Date of Birth:  Nov 06, 1940        BSA:          1.799 m Patient Age:    81 years         BP:           112/73 mmHg Patient Gender: M                HR:           90 bpm. [facility] Procedure: 2D Echo, Cardiac Doppler and Color Doppler Indications:    Aortic Stenosis  History:        Patient has prior history of Echocardiogram examinations, most                 recent 12/26/2021.  Stroke, Aortic Valve Disease, Arrythmias:LBBB,                  Signs/Symptoms:Murmur; Risk Factors:Hypertension, Dyslipidemia                 and Former Smoker.  Sonographer:    Celesta Gentile RCS Referring Phys: [id number] Dorothe Pea BRANCH IMPRESSIONS  1. Left ventricular ejection fraction, by estimation, is 60 to 65%. The left ventricle has normal function. The left ventricle has no regional wall motion abnormalities. There is moderate left ventricular hypertrophy. Left ventricular diastolic parameters are consistent with Grade I diastolic dysfunction (impaired relaxation). Elevated left atrial pressure.  2. Right ventricular systolic function is normal. The right ventricular size is normal. Tricuspid regurgitation signal is inadequate for assessing PA pressure.  3. The mitral valve is abnormal. Mild mitral valve regurgitation. No evidence of mitral stenosis. The mean mitral valve gradient is 3.0 mmHg.HR 65 bpm. Moderate mitral annular calcification.  4. The aortic valve was not well visualized. There is severe calcifcation of the aortic valve. There is severe thickening of the aortic valve. Aortic valve regurgitation is mild. Severe aortic valve stenosis. Aortic valve mean gradient measures 39.0 mmHg. Aortic valve peak gradient measures 50.1 mmHg. Aortic valve area, by VTI measures 0.93 cm.  5. The inferior vena cava is normal in size with greater than 50% respiratory variability, suggesting right atrial pressure of 3 mmHg. FINDINGS  Left Ventricle: Left ventricular ejection fraction, by estimation, is 60 to 65%. The left ventricle has normal function. The left ventricle has no regional wall motion abnormalities. The left ventricular internal cavity size was normal in size. There is  moderate left ventricular hypertrophy. Left ventricular diastolic parameters are consistent with Grade I diastolic dysfunction (impaired relaxation). Elevated left atrial pressure. Right Ventricle: The right ventricular size is normal. Right vetricular wall thickness was not well visualized. Right  ventricular systolic function is normal. Tricuspid regurgitation signal is inadequate for assessing PA pressure. Left Atrium: Left atrial size was normal in size. Right Atrium: Right atrial size was normal in size. Pericardium: There is no evidence of pericardial effusion. Mitral Valve: The mitral valve is abnormal. There is moderate thickening of the mitral valve leaflet(s). There is moderate calcification of the mitral valve leaflet(s). Moderate mitral annular calcification. Mild mitral valve regurgitation. No evidence of mitral valve stenosis. MV peak gradient, 11.6 mmHg. The mean mitral valve gradient is 3.0 mmHg. Tricuspid Valve: The tricuspid valve is normal in structure. Tricuspid valve regurgitation is not demonstrated. No evidence of tricuspid stenosis. Aortic Valve: The aortic valve was not well visualized. There is severe calcifcation of the aortic valve. There is severe thickening of the aortic valve. There is severe aortic valve annular calcification. Aortic valve regurgitation is mild. Aortic regurgitation PHT measures 460 msec. Severe aortic stenosis is present. Aortic valve mean gradient measures 39.0 mmHg. Aortic valve peak gradient measures 50.1 mmHg. Aortic valve area, by VTI measures 0.93 cm. Pulmonic Valve: The pulmonic valve was not well visualized. Pulmonic valve regurgitation is not visualized. No evidence of pulmonic stenosis. Aorta: The aortic root is normal in size and structure. Venous: The inferior vena cava is normal in size with greater than 50% respiratory variability, suggesting right atrial pressure of 3 mmHg. IAS/Shunts: No atrial level shunt detected by color flow Doppler.  LEFT VENTRICLE PLAX 2D LVIDd:         3.40 cm   Diastology LVIDs:         2.00 cm   LV  e' medial:    3.86 cm/s LV PW:         1.40 cm   LV E/e' medial:  20.3 LV IVS:        1.30 cm   LV e' lateral:   5.55 cm/s LVOT diam:     2.00 cm   LV E/e' lateral: 14.1 LV SV:         73 LV SV Index:   41 LVOT Area:      3.14 cm  RIGHT VENTRICLE RV S prime:     8.16 cm/s TAPSE (M-mode): 2.0 cm LEFT ATRIUM             Index        RIGHT ATRIUM           Index LA diam:        3.40 cm 1.89 cm/m   RA Area:     14.50 cm LA Vol (A2C):   76.1 ml 42.29 ml/m  RA Volume:   30.40 ml  16.89 ml/m LA Vol (A4C):   45.6 ml 25.34 ml/m LA Biplane Vol: 58.4 ml 32.46 ml/m  AORTIC VALVE AV Area (Vmax):    0.90 cm AV Area (Vmean):   0.92 cm AV Area (VTI):     0.93 cm AV Vmax:           354.00 cm/s AV Vmean:          259.750 cm/s AV VTI:            0.786 m AV Peak Grad:      50.1 mmHg AV Mean Grad:      39.0 mmHg LVOT Vmax:         101.00 cm/s LVOT Vmean:        76.100 cm/s LVOT VTI:          0.232 m LVOT/AV VTI ratio: 0.30 AI PHT:            460 msec  AORTA Ao Root diam: 3.70 cm MITRAL VALVE MV Area (PHT): 2.20 cm     SHUNTS MV Peak grad:  11.6 mmHg    Systemic VTI:  0.23 m MV Mean grad:  3.0 mmHg     Systemic Diam: 2.00 cm MV Vmax:       1.70 m/s MV Vmean:      63.7 cm/s MV Decel Time: 345 msec MV E velocity: 78.50 cm/s MV A velocity: 170.00 cm/s MV E/A ratio:  0.46 Dina Rich MD Electronically signed by Dina Rich MD Signature Date/Time: 03/11/2023/5:03:24 PM    Final     Procedures Procedures    Medications Ordered in ED Medications  HYDROcodone-acetaminophen (NORCO/VICODIN) 5-325 MG per tablet 1 tablet (1 tablet Oral Not Given 03/30/23 1812)  docusate sodium (COLACE) capsule 100 mg (100 mg Oral Patient Refused/Not Given 03/30/23 1812)  traMADol (ULTRAM) tablet 50 mg (50 mg Oral Given 03/30/23 1843)  docusate sodium (COLACE) capsule 100 mg (100 mg Oral Given 03/30/23 1843)    ED Course/ Medical Decision Making/ A&P Clinical Course as of 03/30/23 Sanjuana Kava Mar 30, 2023  1749 Patient to ED after recent back injury with CT showing L4 burst fracture with >50% loss of height and 4 mm retropulsion. Will discuss subacute injury with neurosurgery Yetta Barre). Pain addressed (Norco with Colace). Labs pending. [SU]  1843 Discussed  with Neurosurgery (Meyren, PA-C). Recommends TSLO brace. Can follow up in the office during or after rehab.  [SU]  1917 Labs reviewed. Potassium low  at 3.0. Will supplement. Mild leukocytosis of 14. UA remains pending. Hospitalist paged for admission, pain management, goal of rehab prior to discharge home.  [SU]  1930 Discussed with Hospitalist who accepted for admission. Family updated.  [SU]    Clinical Course User Index [SU] Elpidio Anis, PA-C                                 Medical Decision Making Amount and/or Complexity of Data Reviewed Labs: ordered.  Risk OTC drugs. Prescription drug management. Decision regarding hospitalization.           Final Clinical Impression(s) / ED Diagnoses Final diagnoses:  Closed burst fracture of lumbar vertebra, initial encounter (HCC)  Acute right-sided low back pain without sciatica    Rx / DC Orders ED Discharge Orders     None         Danne Harbor 03/30/23 Nena Polio, MD 03/31/23 1031

## 2023-03-30 NOTE — ED Notes (Signed)
Dr. Thomes Dinning at the bedside.

## 2023-03-30 NOTE — ED Notes (Signed)
Ortho tech paged for back brace

## 2023-03-30 NOTE — ED Triage Notes (Signed)
Spoke with PA from dr hall office she wants pt to have CT of head/chest. She also stated pt's family would like him sent to a rehab facility.

## 2023-03-31 ENCOUNTER — Encounter (HOSPITAL_COMMUNITY): Payer: Self-pay | Admitting: Radiology

## 2023-03-31 ENCOUNTER — Inpatient Hospital Stay (HOSPITAL_COMMUNITY): Payer: Medicare Other

## 2023-03-31 DIAGNOSIS — Z8719 Personal history of other diseases of the digestive system: Secondary | ICD-10-CM

## 2023-03-31 DIAGNOSIS — K625 Hemorrhage of anus and rectum: Secondary | ICD-10-CM

## 2023-03-31 DIAGNOSIS — K5900 Constipation, unspecified: Secondary | ICD-10-CM

## 2023-03-31 DIAGNOSIS — R1013 Epigastric pain: Secondary | ICD-10-CM

## 2023-03-31 DIAGNOSIS — K922 Gastrointestinal hemorrhage, unspecified: Secondary | ICD-10-CM | POA: Diagnosis not present

## 2023-03-31 DIAGNOSIS — W19XXXA Unspecified fall, initial encounter: Secondary | ICD-10-CM | POA: Diagnosis not present

## 2023-03-31 DIAGNOSIS — S32001A Stable burst fracture of unspecified lumbar vertebra, initial encounter for closed fracture: Secondary | ICD-10-CM | POA: Diagnosis not present

## 2023-03-31 DIAGNOSIS — E039 Hypothyroidism, unspecified: Secondary | ICD-10-CM | POA: Diagnosis not present

## 2023-03-31 DIAGNOSIS — R1030 Lower abdominal pain, unspecified: Secondary | ICD-10-CM | POA: Diagnosis not present

## 2023-03-31 LAB — CBC
HCT: 35.2 % — ABNORMAL LOW (ref 39.0–52.0)
Hemoglobin: 11.7 g/dL — ABNORMAL LOW (ref 13.0–17.0)
MCH: 29.8 pg (ref 26.0–34.0)
MCHC: 33.2 g/dL (ref 30.0–36.0)
MCV: 89.6 fL (ref 80.0–100.0)
Platelets: 332 10*3/uL (ref 150–400)
RBC: 3.93 MIL/uL — ABNORMAL LOW (ref 4.22–5.81)
RDW: 12.6 % (ref 11.5–15.5)
WBC: 12.4 10*3/uL — ABNORMAL HIGH (ref 4.0–10.5)
nRBC: 0 % (ref 0.0–0.2)

## 2023-03-31 LAB — TSH: TSH: 3.627 u[IU]/mL (ref 0.350–4.500)

## 2023-03-31 LAB — COMPREHENSIVE METABOLIC PANEL
ALT: 22 U/L (ref 0–44)
AST: 19 U/L (ref 15–41)
Albumin: 2.3 g/dL — ABNORMAL LOW (ref 3.5–5.0)
Alkaline Phosphatase: 113 U/L (ref 38–126)
Anion gap: 7 (ref 5–15)
BUN: 18 mg/dL (ref 8–23)
CO2: 24 mmol/L (ref 22–32)
Calcium: 7.9 mg/dL — ABNORMAL LOW (ref 8.9–10.3)
Chloride: 104 mmol/L (ref 98–111)
Creatinine, Ser: 1.03 mg/dL (ref 0.61–1.24)
GFR, Estimated: >60 mL/min (ref >60)
Glucose, Bld: 129 mg/dL — ABNORMAL HIGH (ref 70–99)
Potassium: 2.9 mmol/L — ABNORMAL LOW (ref 3.5–5.1)
Sodium: 135 mmol/L (ref 135–145)
Total Bilirubin: 0.8 mg/dL (ref <1.2)
Total Protein: 5.2 g/dL — ABNORMAL LOW (ref 6.5–8.1)

## 2023-03-31 LAB — PHOSPHORUS: Phosphorus: 3.1 mg/dL (ref 2.5–4.6)

## 2023-03-31 LAB — MAGNESIUM: Magnesium: 2.1 mg/dL (ref 1.7–2.4)

## 2023-03-31 LAB — HEMOGLOBIN AND HEMATOCRIT, BLOOD
HCT: 37.7 % — ABNORMAL LOW (ref 39.0–52.0)
Hemoglobin: 12.3 g/dL — ABNORMAL LOW (ref 13.0–17.0)

## 2023-03-31 MED ORDER — POLYETHYLENE GLYCOL 3350 17 G PO PACK
17.0000 g | PACK | Freq: Two times a day (BID) | ORAL | Status: DC
  Administered 2023-04-01: 17 g via ORAL
  Filled 2023-03-31 (×2): qty 1

## 2023-03-31 MED ORDER — POTASSIUM CHLORIDE CRYS ER 20 MEQ PO TBCR
40.0000 meq | EXTENDED_RELEASE_TABLET | ORAL | Status: AC
  Administered 2023-03-31 (×2): 40 meq via ORAL
  Filled 2023-03-31 (×2): qty 2

## 2023-03-31 MED ORDER — METHOCARBAMOL 500 MG PO TABS
500.0000 mg | ORAL_TABLET | Freq: Three times a day (TID) | ORAL | Status: DC
  Administered 2023-03-31 – 2023-04-01 (×4): 500 mg via ORAL
  Filled 2023-03-31 (×4): qty 1

## 2023-03-31 MED ORDER — SENNOSIDES-DOCUSATE SODIUM 8.6-50 MG PO TABS
2.0000 | ORAL_TABLET | Freq: Every day | ORAL | Status: DC
  Administered 2023-04-01: 2 via ORAL
  Filled 2023-03-31: qty 2

## 2023-03-31 MED ORDER — FUROSEMIDE 40 MG PO TABS: 40.0000 mg | ORAL_TABLET | Freq: Every day | ORAL | Status: DC

## 2023-03-31 MED ORDER — ACETAMINOPHEN 325 MG PO TABS: 650.0000 mg | ORAL_TABLET | Freq: Four times a day (QID) | ORAL | Status: DC | PRN

## 2023-03-31 MED ORDER — FENTANYL CITRATE PF 50 MCG/ML IJ SOSY
25.0000 ug | PREFILLED_SYRINGE | INTRAMUSCULAR | Status: DC | PRN
  Administered 2023-03-31 – 2023-04-02 (×2): 25 ug via INTRAVENOUS
  Filled 2023-03-31 (×2): qty 1

## 2023-03-31 MED ORDER — FENTANYL CITRATE PF 50 MCG/ML IJ SOSY: 12.5000 ug | PREFILLED_SYRINGE | Freq: Once | INTRAMUSCULAR | Status: DC

## 2023-03-31 MED ORDER — OXYCODONE HCL 5 MG PO TABS
5.0000 mg | ORAL_TABLET | ORAL | Status: DC | PRN
  Administered 2023-03-31 – 2023-04-02 (×4): 5 mg via ORAL
  Filled 2023-03-31 (×5): qty 1

## 2023-03-31 MED ORDER — IOHEXOL 300 MG/ML  SOLN
100.0000 mL | Freq: Once | INTRAMUSCULAR | Status: AC | PRN
  Administered 2023-03-31: 100 mL via INTRAVENOUS

## 2023-03-31 MED ORDER — ALPRAZOLAM 0.5 MG PO TABS
0.5000 mg | ORAL_TABLET | Freq: Every evening | ORAL | Status: DC | PRN
  Administered 2023-03-31 – 2023-04-01 (×2): 0.5 mg via ORAL
  Filled 2023-03-31 (×2): qty 1

## 2023-03-31 NOTE — Progress Notes (Addendum)
Initial Nutrition Assessment  DOCUMENTATION CODES:   Not applicable  INTERVENTION:   When diet advanced, add: Ensure Plus High Protein po TID, each supplement provides 350 kcal and 20 grams of protein. MVI with minerals daily.  NUTRITION DIAGNOSIS:   Inadequate oral intake related to acute illness as evidenced by NPO status.  GOAL:   Patient will meet greater than or equal to 90% of their needs  MONITOR:   Diet advancement, PO intake, Supplement acceptance  REASON FOR ASSESSMENT:   Malnutrition Screening Tool    ASSESSMENT:   82 yo male admitted with lumbar burst fracture and GIB. PMH includes HTN, HLD, aortic stenosis, CKD, prostate cancer, IBS, hearing impairment, stroke, GERD, hypothyroidism.  On admission, patient reported recent decreased appetite and > 24 lb weight loss. Unable to speak with patient or complete physical exam. Patient out of room. Suspect decreased appetite is r/t constipation. Weight history reviewed. Patient with 4% weight loss in the past 3 months which is not significant for the time frame. Question if current weight was carried over from 10/29 when he was in the ED.  Currently NPO for CT abdomen. May have flexible sigmoidoscopy tomorrow.   Labs reviewed. K 2.9  Medications reviewed and include colace, lasix, protonix, klor-con, flomax.  NUTRITION - FOCUSED PHYSICAL EXAM:  Unable to complete  Diet Order:   Diet Order             Diet NPO time specified Except for: Sips with Meds  Diet effective midnight                   EDUCATION NEEDS:   Not appropriate for education at this time  Skin:  Skin Assessment: Reviewed RN Assessment  Last BM:  11/5 type 5  Height:   Ht Readings from Last 1 Encounters:  03/30/23 5\' 6"  (1.676 m)    Weight:   Wt Readings from Last 1 Encounters:  03/30/23 71.7 kg    Ideal Body Weight:  64.5 kg  BMI:  Body mass index is 25.5 kg/m.  Estimated Nutritional Needs:   Kcal:   1800-2000  Protein:  80-95 gm  Fluid:  1.8-2 L   Gabriel Rainwater RD, LDN, CNSC Please refer to Amion for contact information.

## 2023-03-31 NOTE — Plan of Care (Signed)
  Problem: Acute Rehab PT Goals(only PT should resolve) Goal: Pt Will Go Supine/Side To Sit Outcome: Progressing Flowsheets (Taken 03/31/2023 1221) Pt will go Supine/Side to Sit:  with supervision  with contact guard assist Goal: Patient Will Transfer Sit To/From Stand Outcome: Progressing Flowsheets (Taken 03/31/2023 1221) Patient will transfer sit to/from stand:  with supervision  with contact guard assist Goal: Pt Will Transfer Bed To Chair/Chair To Bed Outcome: Progressing Flowsheets (Taken 03/31/2023 1221) Pt will Transfer Bed to Chair/Chair to Bed:  with supervision  with contact guard assist Goal: Pt Will Ambulate Outcome: Progressing Flowsheets (Taken 03/31/2023 1221) Pt will Ambulate:  75 feet  with supervision  with rolling walker   12:22 PM, 03/31/23 Ocie Bob, MPT Physical Therapist with [facility] [phone] office [phone] mobile phone

## 2023-03-31 NOTE — [place] FL2 (Signed)
[place] MEDICAID FL2 LEVEL OF CARE FORM     IDENTIFICATION  Patient Name: Calvin Moreno Birthdate: 06-02-1940 Sex: male Admission Date (Current Location): 03/30/2023  [facility] and [place] Number:  [employer] and Address:  [facility],  [address], [place] [address]      Provider Number: [id number]  Attending Physician Name and Address:  Shon Hale, MD  Relative Name and Phone Number:       Current Level of Care: Hospital Recommended Level of Care: Skilled Nursing Facility Prior Approval Number:    Date Approved/Denied:   PASRR Number: [id number]  Discharge Plan: SNF    Current Diagnoses: Patient Active Problem List   Diagnosis Date Noted   Lumbar burst fracture (HCC) 03/30/2023   Fall at home, initial encounter 03/30/2023   Acquired hypothyroidism 03/30/2023   GI bleed 03/30/2023   Hypokalemia 12/26/2022   GERD (gastroesophageal reflux disease) 12/26/2022   Prolonged QT interval 12/26/2022   BPH (benign prostatic hyperplasia) 10/09/2020   Weak urinary stream 10/09/2020   Urinary frequency 10/09/2020   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   Rectal bleed 11/06/2011   Bowel habit changes 11/06/2011   Aortic stenosis, mild    Mixed hyperlipidemia    Left bundle branch block    Tobacco abuse, in remission    Metabolic syndrome    Essential hypertension    OBESITY 07/02/2010   Calculus, renal 07/02/2010   INSOMNIA 07/02/2010    Orientation RESPIRATION BLADDER Height & Weight     Self, Situation, Time, Place  Normal Incontinent, External catheter Weight: 158 lb (71.7 kg) Height:  5\' 6"  (167.6 cm)  BEHAVIORAL SYMPTOMS/MOOD NEUROLOGICAL BOWEL NUTRITION STATUS      Incontinent Diet (See D/C summary)  AMBULATORY STATUS COMMUNICATION OF NEEDS Skin   Extensive Assist Verbally Normal                       Personal Care Assistance Level of Assistance  Bathing, Feeding, Dressing Bathing Assistance: Limited  assistance Feeding assistance: Independent Dressing Assistance: Limited assistance     Functional Limitations Info  Sight, Hearing, Speech Sight Info: Adequate Hearing Info: Adequate Speech Info: Adequate    SPECIAL CARE FACTORS FREQUENCY  PT (By licensed PT), OT (By licensed OT)     PT Frequency: 5 times weekly OT Frequency: 5 times weekly            Contractures Contractures Info: Not present    Additional Factors Info  Code Status, Allergies Code Status Info: FULL Allergies Info: Norvasc (Amlodipine Besylate), Codeine, Amoxicillin           Current Medications (03/31/2023):  This is the current hospital active medication list Current Facility-Administered Medications  Medication Dose Route Frequency Provider Last Rate Last Admin   acetaminophen (TYLENOL) tablet 650 mg  650 mg Oral Q6H PRN Emokpae, Courage, MD       docusate sodium (COLACE) capsule 100 mg  100 mg Oral Once Adefeso, Oladapo, DO       feeding supplement (ENSURE ENLIVE / ENSURE PLUS) liquid 237 mL  237 mL Oral BID BM Adefeso, Oladapo, DO       fentaNYL (SUBLIMAZE) injection 25 mcg  25 mcg Intravenous Q4H PRN Emokpae, Courage, MD       finasteride (PROSCAR) tablet 5 mg  5 mg Oral Daily Dorothea Ogle B, RPH   5 mg at 03/31/23 0832   [START ON 04/02/2023] furosemide (LASIX) tablet 40 mg  40 mg Oral Daily  Shon Hale, MD       levothyroxine (SYNTHROID) tablet 25 mcg  25 mcg Oral Q0600 Tressie Ellis, RPH   25 mcg at 03/31/23 0602   melatonin tablet 3 mg  3 mg Oral QHS Adefeso, Oladapo, DO   3 mg at 03/30/23 2224   oxyCODONE (Oxy IR/ROXICODONE) immediate release tablet 5 mg  5 mg Oral Q4H PRN Shon Hale, MD   5 mg at 03/31/23 0833   pantoprazole (PROTONIX) injection 40 mg  40 mg Intravenous Q12H Adefeso, Oladapo, DO   40 mg at 03/31/23 0831   potassium chloride SA (KLOR-CON M) CR tablet 20 mEq  20 mEq Oral Once Adefeso, Oladapo, DO       prochlorperazine (COMPAZINE) injection 10 mg  10 mg  Intravenous Q6H PRN Adefeso, Oladapo, DO       simvastatin (ZOCOR) tablet 20 mg  20 mg Oral QPM Adefeso, Oladapo, DO   20 mg at 03/30/23 2223   tamsulosin (FLOMAX) capsule 0.4 mg  0.4 mg Oral QPC supper Frankey Shown, DO         Discharge Medications: Please see discharge summary for a list of discharge medications.  Relevant Imaging Results:  Relevant Lab Results:   Additional Information SSN: [id number], [place]

## 2023-03-31 NOTE — Evaluation (Signed)
Occupational Therapy Evaluation Patient Details Name: Calvin Moreno MRN: [id number] DOB: June 09, 1940 Today's Date: 03/31/2023   History of Present Illness Calvin Moreno is a 82 y.o. male with medical history significant of hypertension, hyperlipidemia, hypothyroidism, GERD, hearing impairment and history of stroke who presents to the emergency department after being asked to go to the ED for further evaluation and management by PCP due to CT evidence of L4 burst fracture.     He was seen in the ED on 10/29 after sustaining a fall at home, but he left AMA prior to completion of workup.  CT of the lumbar spine done at that time showed a burst fracture of L4.  He presented to PCP today and patient was sent to the ED for further pain control and possible discharge to a rehab.  Patient believes that he fell due to accidental overdose of Norco which caused him to lose balance prior to the fall.  Patient complained of back pain and family complained of progressive decline and loss of appetite per ED medical record.  He endorsed constipation, but denies chest pain, shortness of breath, nausea, vomiting, headache, fever, chills. (per DO)   Clinical Impression   Pt agreeable to OT and PT co-evaluation. Pt reports independence at baseline. Today pt complains of much pain with movement in the back area. TLSO brace not yet in pt's room. Pt required min A for bed mobility and CGA to min A for transfers and mobility. Poor ability to complete lower body dressing due to back pain. Pt lives with his spouse who also has back issues. Pt was left in the chair with call bell within reach. Pt will benefit from continued OT in the hospital and recommended venue below to increase strength, balance, and endurance for safe ADL's.         If plan is discharge home, recommend the following: A lot of help with walking and/or transfers;A lot of help with bathing/dressing/bathroom;Assistance with cooking/housework;Assist for  transportation;Help with stairs or ramp for entrance    Functional Status Assessment  Patient has had a recent decline in their functional status and demonstrates the ability to make significant improvements in function in a reasonable and predictable amount of time.  Equipment Recommendations  None recommended by OT           Precautions / Restrictions Precautions Precautions: Fall Required Braces or Orthoses: Other Brace (TLSO (not yet given to pt as of time of evaluation)) Restrictions Weight Bearing Restrictions: No      Mobility Bed Mobility Overal bed mobility: Needs Assistance Bed Mobility: Rolling, Sidelying to Sit Rolling: Min assist, Contact guard assist Sidelying to sit: Min assist       General bed mobility comments: slow labored; cueing to not twist trunk.    Transfers Overall transfer level: Needs assistance Equipment used: Rolling walker (2 wheels) Transfers: Sit to/from Stand, Bed to chair/wheelchair/BSC Sit to Stand: Contact guard assist, Min assist     Step pivot transfers: Contact guard assist, Min assist     General transfer comment: labored; unsteady      Balance Overall balance assessment: Needs assistance Sitting-balance support: Feet supported, Bilateral upper extremity supported Sitting balance-Leahy Scale: Fair Sitting balance - Comments: fair to good at EOB   Standing balance support: Bilateral upper extremity supported, During functional activity, Reliant on assistive device for balance Standing balance-Leahy Scale: Poor Standing balance comment: poor to fair using RW  ADL either performed or assessed with clinical judgement   ADL Overall ADL's : Needs assistance/impaired     Grooming: Sitting;Contact guard assist;Minimal assistance   Upper Body Bathing: Sitting;Minimal assistance;Set up   Lower Body Bathing: Maximal assistance;Sitting/lateral leans   Upper Body Dressing : Set up;Minimal  assistance;Sitting   Lower Body Dressing: Maximal assistance;Sitting/lateral leans Lower Body Dressing Details (indicate cue type and reason): Pt unable to remove socks completely seated in recliner. Toilet Transfer: Minimal assistance;Contact guard assist;Stand-pivot;Ambulation Toilet Transfer Details (indicate cue type and reason): Simulated via EOB to chair and ambulation Toileting- Clothing Manipulation and Hygiene: Moderate assistance;Maximal assistance;Sitting/lateral lean       Functional mobility during ADLs: Contact guard assist;Minimal assistance;Rolling walker (2 wheels)       Vision Baseline Vision/History: 1 Wears glasses Ability to See in Adequate Light: 1 Impaired Patient Visual Report: No change from baseline Vision Assessment?: No apparent visual deficits     Perception Perception: Not tested       Praxis Praxis: Not tested       Pertinent Vitals/Pain Pain Assessment Pain Assessment: 0-10 Pain Score: 8  Pain Location: low back Pain Descriptors / Indicators: Grimacing, Guarding, Moaning Pain Intervention(s): Limited activity within patient's tolerance, Monitored during session, Repositioned     Extremity/Trunk Assessment Upper Extremity Assessment Upper Extremity Assessment: Generalized weakness   Lower Extremity Assessment Lower Extremity Assessment: Defer to PT evaluation   Cervical / Trunk Assessment Cervical / Trunk Assessment: Kyphotic   Communication Communication Communication: Hearing impairment   Cognition Arousal: Alert Behavior During Therapy: WFL for tasks assessed/performed Overall Cognitive Status: Within Functional Limits for tasks assessed                                                        Home Living Family/patient expects to be discharged to:: Private residence Living Arrangements: Spouse/significant other Available Help at Discharge: Family;Available 24 hours/day Type of Home: House Home Access:  Stairs to enter [employer] of Steps: 2 Entrance Stairs-Rails: Right (going up) Home Layout: One level     Bathroom Shower/Tub: Producer, television/film/video: Standard Bathroom Accessibility: Yes How Accessible: Accessible via walker Home Equipment: Rolling Walker (2 wheels);Cane - single point;Shower seat - built in;Grab bars - tub/shower   Additional Comments: Pt lives with wife who also has a back problem. Wife's siter available PRN.      Prior Functioning/Environment Prior Level of Function : Independent/Modified Independent             Mobility Comments: Community ambulator without AD; drives ADLs Comments: Independent        OT Problem List: Decreased strength;Decreased activity tolerance;Impaired balance (sitting and/or standing);Pain      OT Treatment/Interventions: Self-care/ADL training;Therapeutic exercise;Therapeutic activities;Patient/family education;Balance training    OT Goals(Current goals can be found in the care plan section) Acute Rehab OT Goals Patient Stated Goal: return home OT Goal Formulation: With patient Time For Goal Achievement: 04/14/23 Potential to Achieve Goals: Good  OT Frequency: Min 2X/week    Co-evaluation PT/OT/SLP Co-Evaluation/Treatment: Yes Reason for Co-Treatment: To address functional/ADL transfers   OT goals addressed during session: ADL's and self-care                       End of Session Equipment Utilized During Treatment: Rolling walker (2  wheels)  Activity Tolerance: Patient tolerated treatment well Patient left: with call bell/phone within reach;in chair  OT Visit Diagnosis: Unsteadiness on feet (R26.81);Other abnormalities of gait and mobility (R26.89);Muscle weakness (generalized) (M62.81);History of falling (Z91.81);Repeated falls (R29.6)                Time: 6644-0347 OT Time Calculation (min): 23 min Charges:  OT General Charges $OT Visit: 1 Visit OT Evaluation $OT Eval Low  Complexity: 1 Low  Varvara Legault OT, MOT   Danie Chandler 03/31/2023, 12:00 PM

## 2023-03-31 NOTE — Progress Notes (Addendum)
PROGRESS NOTE  Calvin Moreno, is a 82 y.o. male, DOB - [birth year], ZOX:[id number]  Admit date - 03/30/2023   Admitting Physician Frankey Shown, DO  Outpatient Primary MD for the patient is Benita Stabile, MD  LOS - 1  Chief Complaint  Patient presents with   Fall       Brief Narrative:   82 y.o. male with medical history significant of hypertension, HLD, hypothyroidism, GERD, hearing impairment and history of stroke admitted on 03/30/2023 with back pain due to lumbar burst fracture and concerns for GI bleed    -Assessment and Plan: 1)Lumbar burst fracture/s/p Fall at home -Patient believes that he fell due to accidental overdose of Norco which caused him to lose balance prior to the fall.  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,  -Continue stool softeners, avoid constipation Hgb 13.5 >> 11.7 >>12.3 -CT abdomen pelvis pending  3)GERD--- with H/o  Schatzki's ring s/p dilation,  -Consult appreciated -continue Protonix  4)Hypothyroidism---c/n Levothyroxine  5)H/o prostate cancer s/p XRT --- continue Flomax and Proscar  6)History of prior stroke-hold aspirin due to concerns for GI bleed, hold atorvastatin in setting of falls with risk for CK elevation   7)Social/Ethics--- plan of care discussed with son from [place].....(Calvin Moreno)---[id number]--Questions answered--  Status is: Inpatient   Disposition: The patient is from: Home              Anticipated d/c is to: SNF              Anticipated d/c date is: 2 days              Patient currently is not medically  stable to d/c. Barriers: Not Clinically Stable-   Code Status :  -  Code Status: Full Code   Family Communication:   (patient is alert, awake and coherent)  discussed with son from [place].....(Calvin Moreno)---[id number]--  DVT Prophylaxis  :   - SCDs  SCDs Start: 03/30/23 2012   Lab Results  Component Value Date   PLT 332 03/31/2023   Inpatient Medications  Scheduled Meds:  docusate sodium  100 mg Oral Once   feeding supplement  237 mL Oral BID BM   finasteride  5 mg Oral Daily   [START ON 04/02/2023] furosemide  40 mg Oral Daily   levothyroxine  25 mcg Oral Q0600   melatonin  3 mg Oral QHS   pantoprazole (PROTONIX) IV  40 mg Intravenous Q12H   potassium chloride  20 mEq Oral Once   senna-docusate  2 tablet Oral QHS   simvastatin  20 mg Oral QPM   tamsulosin  0.4 mg Oral QPC supper   Continuous Infusions: PRN Meds:.acetaminophen, fentaNYL (SUBLIMAZE) injection, oxyCODONE, prochlorperazine   Anti-infectives (From admission, onward)    None      Subjective: Calvin Breslow today has no fevers, no emesis,  No chest pain,   - Physical therapy and occupational therapist at bedside working with patient --Reports significant back pain with positional change -Able to walk gingerly with a walker and assistance from the bed to the chair -Rectal bleed concerns persist  -Son from [place] visited today-(Calvin Moreno)---  Objective: Vitals:   03/31/23 0059 03/31/23 0456 03/31/23 0746 03/31/23 1343  BP: 119/62 (!) 106/52 (!) 115/54 134/78  Pulse: 77 77 84 68  Resp: 16 16    Temp: 98.1 F (36.7 C) (!) 97.2 F (36.2 C) 97.9 F (36.6 C) 97.7 F (36.5 C)  TempSrc: Oral Oral Oral Oral  SpO2: 99% 93% 97% 100%  Weight:      Height:       No intake or output data in the 24 hours ending 03/31/23 1634 Filed Weights   03/30/23 1643  Weight: 71.7 kg   Physical Exam Gen:- Awake Alert,  in no apparent distress  HEENT:- [place].AT, No sclera icterus Neck-Supple  Neck,No JVD,.  Lungs-  CTAB , fair symmetrical air movement CV- S1, S2 normal, regular  Abd-  +ve B.Sounds, Abd Soft, epigastric and lower abdominal tenderness, no rebound or guarding    Extremity/Skin:- No  edema, pedal pulses present  Psych-affect is appropriate, oriented x3 Neuro-generalized weakness, no new focal deficits, no tremors MSK-lumbar spine area discomfort with palpation and positional change  Data Reviewed: I have personally reviewed following labs and imaging studies  CBC: Recent Labs  Lab 03/30/23 1817 03/31/23 0423 03/31/23 1323  WBC 14.8* 12.4*  --   NEUTROABS 12.8*  --   --   HGB 13.5 11.7* 12.3*  HCT 40.1 35.2* 37.7*  MCV 89.1 89.6  --   PLT 369 332  --    Basic Metabolic Panel: Recent Labs  Lab 03/30/23 1817 03/31/23 0423  NA 138 135  K 3.0* 2.9*  CL 101 104  CO2 25 24  GLUCOSE 131* 129*  BUN 18 18  CREATININE 0.93 1.03  CALCIUM 8.3* 7.9*  MG  --  2.1  PHOS  --  3.1   GFR: Estimated Creatinine Clearance: 49 mL/min (by C-G formula based on SCr of 1.03 mg/dL). Liver Function Tests: Recent Labs  Lab 03/30/23 1817 03/31/23 0423  AST 24 19  ALT 25 22  ALKPHOS 132* 113  BILITOT 0.9 0.8  PROT 5.9* 5.2*  ALBUMIN 2.6* 2.3*   Scheduled Meds:  docusate sodium  100 mg Oral Once   feeding supplement  237 mL Oral BID BM   finasteride  5 mg Oral Daily   [START ON 04/02/2023] furosemide  40 mg Oral Daily   levothyroxine  25 mcg Oral Q0600   melatonin  3 mg Oral QHS   pantoprazole (PROTONIX) IV  40 mg Intravenous Q12H   potassium chloride  20 mEq Oral Once   senna-docusate  2 tablet Oral QHS   simvastatin  20 mg Oral QPM   tamsulosin  0.4 mg Oral QPC supper   Continuous Infusions:   LOS: 1 day    Shon Hale M.D on 03/31/2023 at 4:34 PM  Go to [email or web address] - for contact info  [facility] - Office  [phone]  If 7PM-7AM, please contact night-coverage [email or web address] 03/31/2023, 4:34 PM

## 2023-03-31 NOTE — Evaluation (Signed)
Physical Therapy Evaluation Patient Details Name: Calvin Moreno MRN: [id number] DOB: 05-30-1940 Today's Date: 03/31/2023  History of Present Illness  Calvin Moreno is a 82 y.o. male with medical history significant of hypertension, hyperlipidemia, hypothyroidism, GERD, hearing impairment and history of stroke who presents to the emergency department after being asked to go to the ED for further evaluation and management by PCP due to CT evidence of L4 burst fracture.     He was seen in the ED on 10/29 after sustaining a fall at home, but he left AMA prior to completion of workup.  CT of the lumbar spine done at that time showed a burst fracture of L4.  He presented to PCP today and patient was sent to the ED for further pain control and possible discharge to a rehab.  Patient believes that he fell due to accidental overdose of Norco which caused him to lose balance prior to the fall.  Patient complained of back pain and family complained of progressive decline and loss of appetite per ED medical record.  He endorsed constipation, but denies chest pain, shortness of breath, nausea, vomiting, headache, fever, chills.   Clinical Impression  Patient demonstrates fair return for rolling to side and sitting up from side lying position but required HOB partially raised and use of bed rails due to weakness and increasing low back pain.  Patient ambulate in room/hallway with slow labored cadence, no loss of balance and limited mostly due to fatigue, weakness and increasing back pain.  Patient tolerated sitting up in chair after therapy.  Patient will benefit from continued skilled physical therapy in hospital and recommended venue below to increase strength, balance, endurance for safe ADLs and gait.          If plan is discharge home, recommend the following: A little help with walking and/or transfers;Help with stairs or ramp for entrance;Assistance with cooking/housework;A lot of help with  bathing/dressing/bathroom   Can travel by private vehicle   Yes    Equipment Recommendations None recommended by PT  Recommendations for Other Services       Functional Status Assessment Patient has had a recent decline in their functional status and demonstrates the ability to make significant improvements in function in a reasonable and predictable amount of time.     Precautions / Restrictions Precautions Precautions: Fall Required Braces or Orthoses: Spinal Brace Spinal Brace: Thoracolumbosacral orthotic Restrictions Weight Bearing Restrictions: No      Mobility  Bed Mobility Overal bed mobility: Needs Assistance Bed Mobility: Rolling, Sidelying to Sit Rolling: Min assist, Contact guard assist, Used rails Sidelying to sit: Min assist, HOB elevated, Used rails       General bed mobility comments: required HOB partially raised and use of bed rails    Transfers Overall transfer level: Needs assistance Equipment used: Rolling walker (2 wheels) Transfers: Sit to/from Stand, Bed to chair/wheelchair/BSC Sit to Stand: Contact guard assist, Min assist   Step pivot transfers: Contact guard assist, Min assist       General transfer comment: labored; unsteady    Ambulation/Gait Ambulation/Gait assistance: Min assist Gait Distance (Feet): 40 Feet Assistive device: Rolling walker (2 wheels) Gait Pattern/deviations: Decreased step length - right, Decreased step length - left, Decreased stride length, Trunk flexed Gait velocity: decreased     General Gait Details: slow labored cadence without loss of balance, limited mostly due to c/o fatigue, increasing back pain and generalized weakness  Stairs  Wheelchair Mobility     Tilt Bed    Modified Rankin (Stroke Patients Only)       Balance Overall balance assessment: Needs assistance Sitting-balance support: Feet supported, No upper extremity supported Sitting balance-Leahy Scale: Fair Sitting  balance - Comments: fair to good at EOB   Standing balance support: Reliant on assistive device for balance, During functional activity, Bilateral upper extremity supported Standing balance-Leahy Scale: Poor Standing balance comment: fair/poor using RW                             Pertinent Vitals/Pain Pain Assessment Pain Assessment: 0-10 Pain Score: 8  Pain Location: low back Pain Descriptors / Indicators: Grimacing, Guarding, Moaning Pain Intervention(s): Limited activity within patient's tolerance, Monitored during session, Repositioned    Home Living Family/patient expects to be discharged to:: Private residence Living Arrangements: Spouse/significant other Available Help at Discharge: Family;Available 24 hours/day Type of Home: House Home Access: Stairs to enter Entrance Stairs-Rails: Right Entrance Stairs-Number of Steps: 2   Home Layout: One level Home Equipment: Agricultural consultant (2 wheels);Cane - single point;Shower seat - built in;Grab bars - tub/shower Additional Comments: Pt lives with wife who also has a back problem. Wife's siter available PRN.    Prior Function Prior Level of Function : Independent/Modified Independent             Mobility Comments: Community ambulator without AD; drives ADLs Comments: Independent     Extremity/Trunk Assessment   Upper Extremity Assessment Upper Extremity Assessment: Defer to OT evaluation    Lower Extremity Assessment Lower Extremity Assessment: Generalized weakness    Cervical / Trunk Assessment Cervical / Trunk Assessment: Kyphotic  Communication   Communication Communication: Hearing impairment Cueing Techniques: Verbal cues;Tactile cues  Cognition Arousal: Alert Behavior During Therapy: WFL for tasks assessed/performed Overall Cognitive Status: Within Functional Limits for tasks assessed                                          General Comments      Exercises      Assessment/Plan    PT Assessment Patient needs continued PT services  PT Problem List Decreased strength;Decreased activity tolerance;Decreased balance;Decreased mobility       PT Treatment Interventions DME instruction;Gait training;Stair training;Functional mobility training;Therapeutic activities;Therapeutic exercise;Balance training;Patient/family education    PT Goals (Current goals can be found in the Care Plan section)  Acute Rehab PT Goals Patient Stated Goal: return home after rehab PT Goal Formulation: With patient Time For Goal Achievement: 04/14/23 Potential to Achieve Goals: Good    Frequency Min 3X/week     Co-evaluation PT/OT/SLP Co-Evaluation/Treatment: Yes Reason for Co-Treatment: To address functional/ADL transfers PT goals addressed during session: Mobility/safety with mobility;Balance;Proper use of DME OT goals addressed during session: ADL's and self-care       AM-PAC PT "6 Clicks" Mobility  Outcome Measure Help needed turning from your back to your side while in a flat bed without using bedrails?: A Little Help needed moving from lying on your back to sitting on the side of a flat bed without using bedrails?: A Little Help needed moving to and from a bed to a chair (including a wheelchair)?: A Little Help needed standing up from a chair using your arms (e.g., wheelchair or bedside chair)?: A Little Help needed to walk in hospital room?: A Little Help  needed climbing 3-5 steps with a railing? : A Lot 6 Click Score: 17    End of Session   Activity Tolerance: Patient tolerated treatment well;Patient limited by fatigue Patient left: in chair;with call bell/phone within reach Nurse Communication: Mobility status PT Visit Diagnosis: Unsteadiness on feet (R26.81);Other abnormalities of gait and mobility (R26.89);Muscle weakness (generalized) (M62.81)    Time: 2130-8657 PT Time Calculation (min) (ACUTE ONLY): 25 min   Charges:   PT Evaluation $PT  Eval Moderate Complexity: 1 Mod PT Treatments $Therapeutic Activity: 23-37 mins PT General Charges $$ ACUTE PT VISIT: 1 Visit         12:16 PM, 03/31/23 Ocie Bob, MPT Physical Therapist with [facility] [phone] office [phone] mobile phone

## 2023-03-31 NOTE — Progress Notes (Signed)
Orthopedic Tech Progress Note Patient Details:  Calvin Moreno 15-Jun-1940 [id number]  Patient ID: [name], male   DOB: April 27, 1941, 82 y.o.   MRN: [id number] I called in tlso order to [facility]. Trinna Post 03/31/2023, 7:28 AM

## 2023-03-31 NOTE — TOC Initial Note (Signed)
Transition of Care [facility]) - Initial/Assessment Note    Patient Details  Name: Calvin Moreno MRN: [id number] Date of Birth: 20-Mar-1941  Transition of Care [facility]) CM/SW Contact:    Villa Herb, LCSWA Phone Number: 03/31/2023, 11:46 AM  Clinical Narrative:                 TOC notes that PT is recommending SNF for pt at D/C. TOC spoke with pt and he is agreeable to SNF referral with no facility preference. CSW to send referral out to local facilities for review. TOC to follow.   Expected Discharge Plan: Skilled Nursing Facility Barriers to Discharge: Continued Medical Work up   Patient Goals and CMS Choice Patient states their goals for this hospitalization and ongoing recovery are:: go to SNF CMS Medicare.gov Compare Post Acute Care list provided to:: Patient Choice offered to / list presented to : Patient      Expected Discharge Plan and Services In-house Referral: Clinical Social Work Discharge Planning Services: CM Consult Post Acute Care Choice: Skilled Nursing Facility Living arrangements for the past 2 months: Single Family Home                                      Prior Living Arrangements/Services Living arrangements for the past 2 months: Single Family Home Lives with:: Self Patient language and need for interpreter reviewed:: Yes Do you feel safe going back to the place where you live?: Yes      Need for Family Participation in Patient Care: Yes (Comment) Care giver support system in place?: Yes (comment)   Criminal Activity/Legal Involvement Pertinent to Current Situation/Hospitalization: No - Comment as needed  Activities of Daily Living   ADL Screening (condition at time of admission) Independently performs ADLs?: No Does the patient have a NEW difficulty with bathing/dressing/toileting/self-feeding that is expected to last >3 days?: Yes (Initiates electronic notice to provider for possible OT consult) Does the patient have a NEW difficulty with  getting in/out of bed, walking, or climbing stairs that is expected to last >3 days?: Yes (Initiates electronic notice to provider for possible PT consult) Does the patient have a NEW difficulty with communication that is expected to last >3 days?: No Is the patient deaf or have difficulty hearing?: Yes Does the patient have difficulty seeing, even when wearing glasses/contacts?: No Does the patient have difficulty concentrating, remembering, or making decisions?: No  Permission Sought/Granted                  Emotional Assessment Appearance:: Appears stated age Attitude/Demeanor/Rapport: Engaged Affect (typically observed): Accepting Orientation: : Oriented to Self, Oriented to Place, Oriented to Situation, Oriented to  Time Alcohol / Substance Use: Not Applicable Psych Involvement: No (comment)  Admission diagnosis:  Lumbar burst fracture (HCC) [S32.001A] Closed burst fracture of lumbar vertebra, initial encounter (HCC) [S32.001A] Acute right-sided low back pain without sciatica [M54.50] Patient Active Problem List   Diagnosis Date Noted   Lumbar burst fracture (HCC) 03/30/2023   Fall at home, initial encounter 03/30/2023   Acquired hypothyroidism 03/30/2023   GI bleed 03/30/2023   Hypokalemia 12/26/2022   GERD (gastroesophageal reflux disease) 12/26/2022   Prolonged QT interval 12/26/2022   BPH (benign prostatic hyperplasia) 10/09/2020   Weak urinary stream 10/09/2020   Urinary frequency 10/09/2020   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 [place] [address] Phone: [phone] Fax: [phone]     Social Determinants of Health (SDOH) Social History: SDOH Screenings   Food Insecurity: No Food Insecurity (03/30/2023)  Housing: Low Risk  (03/30/2023)  Transportation Needs: No Transportation Needs (03/30/2023)  Utilities: Not At Risk (03/30/2023)  Tobacco Use: Medium Risk (03/30/2023)   SDOH Interventions:     Readmission Risk Interventions    03/31/2023   11:44 AM  Readmission Risk Prevention Plan  Transportation Screening Complete  Home Care Screening Complete  Medication Review (RN CM) Complete

## 2023-03-31 NOTE — Plan of Care (Signed)
  Problem: Acute Rehab OT Goals (only OT should resolve) Goal: Pt. Will Perform Grooming Flowsheets (Taken 03/31/2023 1202) Pt Will Perform Grooming:  with modified independence  standing Goal: Pt. Will Perform Upper Body Dressing Flowsheets (Taken 03/31/2023 1202) Pt Will Perform Upper Body Dressing:  with modified independence  sitting Goal: Pt. Will Perform Lower Body Dressing Flowsheets (Taken 03/31/2023 1202) Pt Will Perform Lower Body Dressing:  with contact guard assist  sitting/lateral leans Goal: Pt. Will Transfer To Toilet Flowsheets (Taken 03/31/2023 1202) Pt Will Transfer to Toilet:  with modified independence  ambulating Goal: Pt. Will Perform Toileting-Clothing Manipulation Flowsheets (Taken 03/31/2023 1202) Pt Will Perform Toileting - Clothing Manipulation and hygiene:  with modified independence  sitting/lateral leans Goal: Pt/Caregiver Will Perform Home Exercise Program Flowsheets (Taken 03/31/2023 1202) Pt/caregiver will Perform Home Exercise Program:  Increased strength  Both right and left upper extremity  Independently  Kamarri Fischetti OT, MOT

## 2023-03-31 NOTE — Consult Note (Addendum)
@LOGO @   Referring Provider: Frankey Shown, DO Primary Care Physician:  Benita Stabile, MD Primary Gastroenterologist:  Dr. Jena Gauss  Date of Admission: 03/30/23 Date of Consultation: 03/31/23  Reason for Consultation:  Rectal bleeding  HPI:  Calvin Moreno is a 82 y.o. 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   He was seen in the ED on 10/29 after sustaining a fall at home, but he left AMA prior to completion of workup.  CT of the lumbar spine done at that time showed a burst fracture of L4. He was seen by PCP on 11/4 and sent to the ED for further pain control and possible discharge to a rehab.   When he presented to the ER this time, he complained of back pain and family complained of progressive decline and loss of appetite per ED medical record.  He endorsed constipation and rectal bleeding since last Tuesday when he got an enema.    ED Course:  Hemodynamically stable.   Workup in the ED showed normal CBC except for WBC of 14.8.  BMP was normal except for potassium of 3.0, blood glucose 131, albumin 2.6.  Urinalysis was normal.  FOBT was positive.  EKG with prolonged QT.  CT lumbar spine without contrast done on 03/24/2023 showed acute/subacute burst fracture of L4 with greater than 50% height loss and 4 mm retropulsion. Patient was treated with tramadol, Colace.  Potassium was replenished Milus Banister (PA for neurosurgery team) was consulted and recommended TLSO brace and outpatient follow-up during or after rehab per ED PA. Hospitalist was asked to admit patient for further evaluation and management. GI consulted by hospitalist due to rectal  bleeding.    Hgb down to 11.7 this morning. K 2.9. Hospitalist has ordered 40 meq K at 9 am and 12pm.   Consult:  Reports he has been passing bright red blood when he passes gas since having enema.  States he can be a small amount or medium sized amount.  Sometimes will pass some jelly substance as well.  No melena.  Last episode of rectal bleeding was early this morning before 7 AM.  Nursing staff states that patient had a small amount of bright red blood only on bedding this morning.   Patient reports he is only passed a small amount of stool intermittently since having the enema last week.  He is taken Colace a couple of times at home, but nothing routinely to help with constipation.  Reports constipation started after he started taking hydrocodone for back pain.  No chronic issues with constipation prior to this.  He has also developed some lower abdominal pain since he fell last week.  Denies any worsening pain, but it has been persistent.  Does not seem to be affected by meals.  Has not had enough of a bowel movement to tell if it would improve with this.  He has also noticed some epigastric burning over the last week when taking meds. None today.  No postprandial symptoms.  No nausea or vomiting.  He has been taking pantoprazole at home.  Looks like it was decreased to once daily on 10/8 per Dr. Jena Gauss.  Usually, does not have any heartburn.  No dysphagia.  Infrequent ibuprofen. No aspirin powders.   Last EGD 12/31/2022: Mild Schatzki's ring dilated and biopsied, otherwise normal exam.  Recommended continuing PPI twice daily.  Pathology with reflux changes.  Last colonoscopy 06/12/2020: Diverticulosis in the sigmoid colon, changes consistent with radiation proctitis s/p APC.   Past Medical History:  Diagnosis Date   Aortic stenosis, mod 2021   And insufficiency, evaluation by Dr. Daleen Squibb in 2000   Arthritis    Benign prostatic hypertrophy    PSA of 1.39 in 09/2010   Chronic kidney disease     Borderline; creatinine of 1.6 in 08/2009, but 1.15 in 05/2010   Chronic sinusitis    By MRI   Diverticulosis 2013   GERD (gastroesophageal reflux disease)    Hearing impaired    Hemorrhoids 2006   Hiatal hernia 2006   HOH (hard of hearing)    Hyperlipidemia    Elevated triglycerides   Hypertension    Mild internal carotid artery plaque on MRI/MRA in 2002   Hypothyroidism    IBS (irritable bowel syndrome)    Insomnia    Left bundle branch block 2012   2012   Metabolic syndrome    Fasting hyperglycemia   Murmur    Nephrolithiasis 08/2009   2011   Obesity    Proctitis    radiation induced   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   s/p XRT   Seasonal allergies    Stroke (HCC)    TIA, no residual, seen on MRI   Tobacco abuse, in remission    60 pack years; quit in 1999   Uses hearing aid    bilateral    Past Surgical History:  Procedure Laterality Date   ANKLE FRACTURE SURGERY     left   BIOPSY  12/31/2022   Procedure: BIOPSY;  Surgeon: Corbin Ade, MD;  Location: [facility];  Service: Endoscopy;;   CATARACT EXTRACTION Left    CATARACT EXTRACTION W/PHACO Right 12/19/2013   Procedure: CATARACT EXTRACTION PHACO AND INTRAOCULAR LENS PLACEMENT RIGHT EYE CDE=14.78;  Surgeon: Gemma Payor, MD;  Location: [facility];  Service: Ophthalmology;  Laterality: Right;   CHOLECYSTECTOMY     COLONOSCOPY  2006   Dr. Henry Russel mucosa throughout colon, moderate internal hemorrhoids. bx= benign colonic mucosa   COLONOSCOPY  12/03/2011   Dr. Jena Gauss- colonic diverticulosis, radiation-induced proctitis- s/p APC ablation, no microscopic colitis on bx   COLONOSCOPY WITH PROPOFOL N/A 06/12/2020   Procedure: COLONOSCOPY WITH PROPOFOL;  Surgeon: Corbin Ade, MD;  Location: [facility];  Service: Endoscopy;  Laterality: N/A;  2:45pm   ESOPHAGOGASTRODUODENOSCOPY  2006   Dr. Randa Evens- small hiatal hernia and slightly reddened distal esophagus o/w normal   ESOPHAGOGASTRODUODENOSCOPY (EGD) WITH  PROPOFOL N/A 12/31/2022   Procedure: ESOPHAGOGASTRODUODENOSCOPY (EGD) WITH PROPOFOL;  Surgeon: Corbin Ade, MD;  Location: [facility];  Service: Endoscopy;  Laterality: N/A;  1245pm, asa 3   FLEXIBLE SIGMOIDOSCOPY N/A 07/13/2013   [id number] changes of the rectum with active oozing consistent with chronic radiation proctitis.  Otherwise, negative sigmoidoscopy to 40 cm.Status post argon plasma coagulation ablation    HOT HEMOSTASIS N/A 07/13/2013   Procedure: HOT HEMOSTASIS (ARGON PLASMA COAGULATION/BICAP);  Surgeon: Corbin Ade, MD;  Location: [facility];  Service: Endoscopy;  Laterality: N/A;   Ileocolonoscopy  12/03/2011   XBJ:[id number] diverticulosis. Radiation-induced proctitis status post APC ablation. Status post segmental colon Biopsy   MALONEY DILATION N/A 12/31/2022   Procedure: Elease Hashimoto DILATION;  Surgeon: Corbin Ade,  MD;  Location: [facility];  Service: Endoscopy;  Laterality: N/A;   TONSILLECTOMY      Prior to Admission medications   Medication Sig Start Date End Date Taking? Authorizing Provider  acyclovir (ZOVIRAX) 800 MG tablet Take 800 mg by mouth See admin instructions. Take 800 mg daily, increase to 800 mg 3 times daily as needed when having a fever blister flare    [provider]  ALPRAZolam (XANAX) 0.5 MG tablet Take 0.5 mg by mouth at bedtime as needed for sleep.    [provider]  Ascorbic Acid (VITAMIN C) 1000 MG tablet Take 1,000 mg by mouth daily.    [provider]  aspirin EC 81 MG tablet Take 81 mg by mouth at bedtime.    [provider]  clotrimazole-betamethasone (LOTRISONE) cream SMARTSIG:Topical Morning-Evening 02/17/23   [provider]  finasteride (PROSCAR) 5 MG tablet Take 1 tablet (5 mg total) by mouth daily. 02/11/23   McKenzie, Mardene Celeste, MD  fluorometholone (FML) 0.1 % ophthalmic suspension SMARTSIG:In Eye(s) 11/26/22   [provider]  fluticasone (CUTIVATE) 0.05 % cream  SMARTSIG:Topical 1-2 Times Daily PRN 12/10/22   [provider]  furosemide (LASIX) 40 MG tablet Take 40 mg by mouth daily. 12/10/21   [provider]  gabapentin (NEURONTIN) 100 MG capsule Take by mouth. 09/09/22   [provider]  HYDROcodone-acetaminophen (NORCO) 10-325 MG tablet Take 1 tablet by mouth 4 (four) times daily as needed. 11/26/22   [provider]  levothyroxine (SYNTHROID) 25 MCG tablet Take 25 mcg by mouth daily. 10/24/22   [provider]  Melatonin 3 MG TABS Take 3 mg by mouth at bedtime. For sleep    [provider]  pantoprazole (PROTONIX) 40 MG tablet Take 1 tablet (40 mg total) by mouth daily before breakfast. 03/03/23   Rourk, Gerrit Friends, MD  Polyethyl Glycol-Propyl Glycol (SYSTANE OP) Place 1 drop into both eyes 2 (two) times daily as needed (dry eyes).    [provider]  potassium chloride (KLOR-CON M) 10 MEQ tablet Take 1 tablet (10 mEq total) by mouth daily. 12/27/22   Johnson, Clanford L, MD  silodosin (RAPAFLO) 8 MG CAPS capsule Take 1 capsule (8 mg total) by mouth 2 (two) times daily. 02/11/23   McKenzie, Mardene Celeste, MD  simvastatin (ZOCOR) 20 MG tablet Take 20 mg by mouth every evening.    [provider]  vitamin B-12 (CYANOCOBALAMIN) 1000 MCG tablet Take 2,000 mcg by mouth daily.    [provider]    Current Facility-Administered Medications  Medication Dose Route Frequency Provider Last Rate Last Admin   acetaminophen (TYLENOL) tablet 650 mg  650 mg Oral Q6H PRN Emokpae, Courage, MD       docusate sodium (COLACE) capsule 100 mg  100 mg Oral Once Adefeso, Oladapo, DO       feeding supplement (ENSURE ENLIVE / ENSURE PLUS) liquid 237 mL  237 mL Oral BID BM Adefeso, Oladapo, DO       fentaNYL (SUBLIMAZE) injection 25 mcg  25 mcg Intravenous Q4H PRN Emokpae, Courage, MD       finasteride (PROSCAR) tablet 5 mg  5 mg Oral Daily Dorothea Ogle B, RPH   5 mg at 03/31/23 0832   [START ON 04/02/2023]  furosemide (LASIX) tablet 40 mg  40 mg Oral Daily Emokpae, Courage, MD       levothyroxine (SYNTHROID) tablet 25 mcg  25 mcg Oral Q0600 Tressie Ellis, [facility]  25 mcg at 03/31/23 0602   melatonin tablet 3 mg  3 mg Oral QHS Adefeso, Oladapo, DO   3 mg at 03/30/23 2224   oxyCODONE (Oxy IR/ROXICODONE) immediate release tablet 5 mg  5 mg Oral Q4H PRN Shon Hale, MD   5 mg at 03/31/23 0833   pantoprazole (PROTONIX) injection 40 mg  40 mg Intravenous Q12H Adefeso, Oladapo, DO   40 mg at 03/31/23 0831   potassium chloride SA (KLOR-CON M) CR tablet 20 mEq  20 mEq Oral Once Adefeso, Oladapo, DO       prochlorperazine (COMPAZINE) injection 10 mg  10 mg Intravenous Q6H PRN Adefeso, Oladapo, DO       simvastatin (ZOCOR) tablet 20 mg  20 mg Oral QPM Adefeso, Oladapo, DO   20 mg at 03/30/23 2223   tamsulosin (FLOMAX) capsule 0.4 mg  0.4 mg Oral QPC supper Adefeso, Oladapo, DO        Allergies as of 03/30/2023 - Review Complete 03/30/2023  Allergen Reaction Noted   Norvasc [amlodipine besylate] Swelling 09/19/2014   Codeine Nausea And Vomiting 11/06/2011   Amoxicillin Rash 12/16/2021    Family History  Problem Relation Age of Onset   Heart failure Mother    Heart attack Father    Heart failure Brother        bladder cancer (agent orange exposure)   Cirrhosis Brother        chronic viral hepatitis   Colon cancer Neg Hx     Social History   Socioeconomic History   Marital status: Married    Spouse name: Not on file   Number of children: 2   Years of education: Not on file   Highest education level: Not on file  Occupational History   Occupation: Retired    Associate Professor: [employer]  Tobacco Use   Smoking status: Former    Current packs/day: 0.00    Average packs/day: 1.5 packs/day for 40.0 years (60.0 ttl pk-yrs)    Types: Cigarettes    Start date: 11/04/1957    Quit date: 11/04/1997    Years since quitting: 25.4    Passive exposure: Never   Smokeless tobacco: Never   Tobacco  comments:    Quit in 1999  Vaping Use   Vaping status: Never Used  Substance and Sexual Activity   Alcohol use: No    Alcohol/week: 0.0 standard drinks of alcohol   Drug use: No   Sexual activity: Yes  Other Topics Concern   Not on file  Social History Narrative   Married with 2 children   Lives locally   No regular exercise   Social Determinants of Health   Financial Resource Strain: Not on file  Food Insecurity: No Food Insecurity (03/30/2023)   Hunger Vital Sign    Worried About Running Out of Food in the Last Year: Never true    Ran Out of Food in the Last Year: Never true  Transportation Needs: No Transportation Needs (03/30/2023)   PRAPARE - Administrator, Civil Service (Medical): No    Lack of Transportation (Non-Medical): No  Physical Activity: Not on file  Stress: Not on file  Social Connections: Not on file  Intimate Partner Violence: Not At Risk (03/30/2023)   Humiliation, Afraid, Rape, and Kick questionnaire    Fear of Current or Ex-Partner: No    Emotionally Abused: No    Physically Abused: No    Sexually Abused: No    Review of Systems: Gen:  Denies fever, chills, cold or flulike symptoms, presyncope, syncope. CV: Denies chest pain, heart palpitations. Resp: Denies shortness of breath, cough. GI: See HPI GU : Denies urinary burning, urinary frequency, urinary incontinence.  MS: Reports back pain. Heme: See HPI.  Physical Exam: Vital signs in last 24 hours: Temp:  [97.2 F (36.2 C)-98.8 F (37.1 C)] 97.9 F (36.6 C) (11/05 0746) Pulse Rate:  [77-100] 84 (11/05 0746) Resp:  [16-20] 16 (11/05 0456) BP: (99-122)/(52-68) 115/54 (11/05 0746) SpO2:  [93 %-100 %] 97 % (11/05 0746) FiO2 (%):  [21 %] 21 % (11/04 2009) Weight:  [71.7 kg] 71.7 kg (11/04 1643) Last BM Date : 03/30/23 General:   Alert,  Well-developed, well-nourished, pleasant and cooperative, in bedside recliner.  Head:  Normocephalic and atraumatic. Eyes:  Sclera clear, no  icterus.   Conjunctiva pink. Ears: Decreased auditory acuity.  Hearing aids in place.   Lungs:  Clear throughout to auscultation.   No wheezes, crackles, or rhonchi. No acute distress. Heart:  Regular rate and rhythm; no murmurs, clicks, rubs,  or gallops. Abdomen:  Soft, non-distended. Mild to moderate TTP in the lower abdomen, but RLQ worse than LLQ. Mild TTP in epigastric area. No masses, hepatosplenomegaly or hernias noted. Hypoactive bowel sounds. No guarding or rebound.   Rectal:  Deferred as patient was in bedside recliner. Msk:  Symmetrical without gross deformities. Normal posture. Pulses:  Normal pulses noted. Extremities:  Without edema. Neurologic:  Alert and  oriented x4. Psych:  Normal mood and affect, but irritable due to back pain and wanting to get back in bed.   Intake/Output from previous day: No intake/output data recorded. Intake/Output this shift: No intake/output data recorded.  Lab Results: Recent Labs    03/30/23 1817 03/31/23 0423  WBC 14.8* 12.4*  HGB 13.5 11.7*  HCT 40.1 35.2*  PLT 369 332   BMET Recent Labs    03/30/23 1817 03/31/23 0423  NA 138 135  K 3.0* 2.9*  CL 101 104  CO2 25 24  GLUCOSE 131* 129*  BUN 18 18  CREATININE 0.93 1.03  CALCIUM 8.3* 7.9*   LFT Recent Labs    03/30/23 1817 03/31/23 0423  PROT 5.9* 5.2*  ALBUMIN 2.6* 2.3*  AST 24 19  ALT 25 22  ALKPHOS 132* 113  BILITOT 0.9 0.8    Impression: 82 y.o. 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  Upon arrival, patient also reporting rectal bleeding.  GI consulted for further evaluation of rectal  bleeding.  Rectal bleeding: Had been doing well without rectal bleeding since his last colonoscopy in 2022 until receiving an enema in the emergency room on 10/29 for constipation.  Reports he had intermittent bright red blood per rectum daily since 10/30, typically occurring when he passes gas. He has passed very little stool. He has also had some lower abdominal pain since his fall last week. Hgb on admission 13.5, down to 11.7 this morning with last episode of rectal bleeding this morning (small amount). Decline in Hgb likely influenced by hemodilution following IV fluids yesterday as all cell lines declined.   Considering abdominal pain, unable to rule out colitis, specifically stercoral colitis in  light of constipation. Otherwise, rectal bleeding may very well be related to known radiation proctitis. I have recommended CT A/P due to abdominal pain. Ultimately would recommend flex sig for further evaluation of rectal bleeding pending CT as well as correction of electrolytes.   Constipation:  Likely secondary to opioid pain medications for back pain. Will start MiraLAX daily for now  Hypokalemia:  K 2.9 this morning. Correction per hospitalist.   Epigastric burning:  New onset over the last week when taking medications. Likely related to GERD as PPI was decreased to once daily on 10/8. Currently doing well on BID dosing since admission. Recommend continuing this at discharge. No indication for repeat EGD. Last EGD in August 2024 with Schatzki's ring and reflux changes in the esophagus.  Plan: CT A/P with contrast  Would be ok to have clear liquids today following CT.  NPO at midnight for possible flex sig tomorrow.  Monitor for ongoing rectal bleeding. Monitor H/H. Transfuse for Hgb <7.  MiraLAX 17 g BID once diet resumed following CT.  Continue PPI BID.  Correction of hypokalemia per hospitalist.   LOS: 1 day    03/31/2023, 12:23 PM   Ermalinda Memos, PA-C [facility]  Gastroenterology

## 2023-03-31 NOTE — Progress Notes (Signed)
Ortho tech paged for TLSO brace.  Information given, ortho tech to bring brace in morning.  Will minimize movement until brace secured.

## 2023-03-31 NOTE — Progress Notes (Signed)
Pt having multiple bright red bloody BMs with clots since 2100 admit. Dr. Thomes Dinning notified of events.  Also discussed pain control options and family concerns.  Orders given for small bolus, CTs, hemoccult, and follow-up discussed.

## 2023-03-31 NOTE — H&P (View-Only) (Signed)
@LOGO @   Referring Provider: Frankey Shown, DO Primary Care Physician:  Benita Stabile, MD Primary Gastroenterologist:  Dr. Jena Gauss  Date of Admission: 03/30/23 Date of Consultation: 03/31/23  Reason for Consultation:  Rectal bleeding  HPI:  Calvin Moreno is a 82 y.o. 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   He was seen in the ED on 10/29 after sustaining a fall at home, but he left AMA prior to completion of workup.  CT of the lumbar spine done at that time showed a burst fracture of L4. He was seen by PCP on 11/4 and sent to the ED for further pain control and possible discharge to a rehab.   When he presented to the ER this time, he complained of back pain and family complained of progressive decline and loss of appetite per ED medical record.  He endorsed constipation and rectal bleeding since last Tuesday when he got an enema.    ED Course:  Hemodynamically stable.   Workup in the ED showed normal CBC except for WBC of 14.8.  BMP was normal except for potassium of 3.0, blood glucose 131, albumin 2.6.  Urinalysis was normal.  FOBT was positive.  EKG with prolonged QT.  CT lumbar spine without contrast done on 03/24/2023 showed acute/subacute burst fracture of L4 with greater than 50% height loss and 4 mm retropulsion. Patient was treated with tramadol, Colace.  Potassium was replenished Milus Banister (PA for neurosurgery team) was consulted and recommended TLSO brace and outpatient follow-up during or after rehab per ED PA. Hospitalist was asked to admit patient for further evaluation and management. GI consulted by hospitalist due to rectal  bleeding.    Hgb down to 11.7 this morning. K 2.9. Hospitalist has ordered 40 meq K at 9 am and 12pm.   Consult:  Reports he has been passing bright red blood when he passes gas since having enema.  States he can be a small amount or medium sized amount.  Sometimes will pass some jelly substance as well.  No melena.  Last episode of rectal bleeding was early this morning before 7 AM.  Nursing staff states that patient had a small amount of bright red blood only on bedding this morning.   Patient reports he is only passed a small amount of stool intermittently since having the enema last week.  He is taken Colace a couple of times at home, but nothing routinely to help with constipation.  Reports constipation started after he started taking hydrocodone for back pain.  No chronic issues with constipation prior to this.  He has also developed some lower abdominal pain since he fell last week.  Denies any worsening pain, but it has been persistent.  Does not seem to be affected by meals.  Has not had enough of a bowel movement to tell if it would improve with this.  He has also noticed some epigastric burning over the last week when taking meds. None today.  No postprandial symptoms.  No nausea or vomiting.  He has been taking pantoprazole at home.  Looks like it was decreased to once daily on 10/8 per Dr. Jena Gauss.  Usually, does not have any heartburn.  No dysphagia.  Infrequent ibuprofen. No aspirin powders.   Last EGD 12/31/2022: Mild Schatzki's ring dilated and biopsied, otherwise normal exam.  Recommended continuing PPI twice daily.  Pathology with reflux changes.  Last colonoscopy 06/12/2020: Diverticulosis in the sigmoid colon, changes consistent with radiation proctitis s/p APC.   Past Medical History:  Diagnosis Date   Aortic stenosis, mod 2021   And insufficiency, evaluation by Dr. Daleen Squibb in 2000   Arthritis    Benign prostatic hypertrophy    PSA of 1.39 in 09/2010   Chronic kidney disease     Borderline; creatinine of 1.6 in 08/2009, but 1.15 in 05/2010   Chronic sinusitis    By MRI   Diverticulosis 2013   GERD (gastroesophageal reflux disease)    Hearing impaired    Hemorrhoids 2006   Hiatal hernia 2006   HOH (hard of hearing)    Hyperlipidemia    Elevated triglycerides   Hypertension    Mild internal carotid artery plaque on MRI/MRA in 2002   Hypothyroidism    IBS (irritable bowel syndrome)    Insomnia    Left bundle branch block 2012   2012   Metabolic syndrome    Fasting hyperglycemia   Murmur    Nephrolithiasis 08/2009   2011   Obesity    Proctitis    radiation induced   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   s/p XRT   Seasonal allergies    Stroke (HCC)    TIA, no residual, seen on MRI   Tobacco abuse, in remission    60 pack years; quit in 1999   Uses hearing aid    bilateral    Past Surgical History:  Procedure Laterality Date   ANKLE FRACTURE SURGERY     left   BIOPSY  12/31/2022   Procedure: BIOPSY;  Surgeon: Corbin Ade, MD;  Location: [facility];  Service: Endoscopy;;   CATARACT EXTRACTION Left    CATARACT EXTRACTION W/PHACO Right 12/19/2013   Procedure: CATARACT EXTRACTION PHACO AND INTRAOCULAR LENS PLACEMENT RIGHT EYE CDE=14.78;  Surgeon: Gemma Payor, MD;  Location: [facility];  Service: Ophthalmology;  Laterality: Right;   CHOLECYSTECTOMY     COLONOSCOPY  2006   Dr. Henry Russel mucosa throughout colon, moderate internal hemorrhoids. bx= benign colonic mucosa   COLONOSCOPY  12/03/2011   Dr. Jena Gauss- colonic diverticulosis, radiation-induced proctitis- s/p APC ablation, no microscopic colitis on bx   COLONOSCOPY WITH PROPOFOL N/A 06/12/2020   Procedure: COLONOSCOPY WITH PROPOFOL;  Surgeon: Corbin Ade, MD;  Location: [facility];  Service: Endoscopy;  Laterality: N/A;  2:45pm   ESOPHAGOGASTRODUODENOSCOPY  2006   Dr. Randa Evens- small hiatal hernia and slightly reddened distal esophagus o/w normal   ESOPHAGOGASTRODUODENOSCOPY (EGD) WITH  PROPOFOL N/A 12/31/2022   Procedure: ESOPHAGOGASTRODUODENOSCOPY (EGD) WITH PROPOFOL;  Surgeon: Corbin Ade, MD;  Location: [facility];  Service: Endoscopy;  Laterality: N/A;  1245pm, asa 3   FLEXIBLE SIGMOIDOSCOPY N/A 07/13/2013   [id number] changes of the rectum with active oozing consistent with chronic radiation proctitis.  Otherwise, negative sigmoidoscopy to 40 cm.Status post argon plasma coagulation ablation    HOT HEMOSTASIS N/A 07/13/2013   Procedure: HOT HEMOSTASIS (ARGON PLASMA COAGULATION/BICAP);  Surgeon: Corbin Ade, MD;  [facility]: [facility];  Service: Endoscopy;  Laterality: N/A;   Ileocolonoscopy  12/03/2011   [id number] diverticulosis. Radiation-induced proctitis status post APC ablation. Status post segmental colon Biopsy   MALONEY DILATION N/A 12/31/2022   Procedure: Elease Hashimoto DILATION;  Surgeon: Corbin Ade,  MD;  Location: [facility];  Service: Endoscopy;  Laterality: N/A;   TONSILLECTOMY      Prior to Admission medications   Medication Sig Start Date End Date Taking? Authorizing Provider  acyclovir (ZOVIRAX) 800 MG tablet Take 800 mg by mouth See admin instructions. Take 800 mg daily, increase to 800 mg 3 times daily as needed when having a fever blister flare    [provider]  ALPRAZolam (XANAX) 0.5 MG tablet Take 0.5 mg by mouth at bedtime as needed for sleep.    [provider]  Ascorbic Acid (VITAMIN C) 1000 MG tablet Take 1,000 mg by mouth daily.    [provider]  aspirin EC 81 MG tablet Take 81 mg by mouth at bedtime.    [provider]  clotrimazole-betamethasone (LOTRISONE) cream SMARTSIG:Topical Morning-Evening 02/17/23   [provider]  finasteride (PROSCAR) 5 MG tablet Take 1 tablet (5 mg total) by mouth daily. 02/11/23   McKenzie, Mardene Celeste, MD  fluorometholone (FML) 0.1 % ophthalmic suspension SMARTSIG:In Eye(s) 11/26/22   [provider]  fluticasone (CUTIVATE) 0.05 % cream  SMARTSIG:Topical 1-2 Times Daily PRN 12/10/22   [provider]  furosemide (LASIX) 40 MG tablet Take 40 mg by mouth daily. 12/10/21   [provider]  gabapentin (NEURONTIN) 100 MG capsule Take by mouth. 09/09/22   [provider]  HYDROcodone-acetaminophen (NORCO) 10-325 MG tablet Take 1 tablet by mouth 4 (four) times daily as needed. 11/26/22   [provider]  levothyroxine (SYNTHROID) 25 MCG tablet Take 25 mcg by mouth daily. 10/24/22   [provider]  Melatonin 3 MG TABS Take 3 mg by mouth at bedtime. For sleep    [provider]  pantoprazole (PROTONIX) 40 MG tablet Take 1 tablet (40 mg total) by mouth daily before breakfast. 03/03/23   Rourk, Gerrit Friends, MD  Polyethyl Glycol-Propyl Glycol (SYSTANE OP) Place 1 drop into both eyes 2 (two) times daily as needed (dry eyes).    [provider]  potassium chloride (KLOR-CON M) 10 MEQ tablet Take 1 tablet (10 mEq total) by mouth daily. 12/27/22   Johnson, Clanford L, MD  silodosin (RAPAFLO) 8 MG CAPS capsule Take 1 capsule (8 mg total) by mouth 2 (two) times daily. 02/11/23   McKenzie, Mardene Celeste, MD  simvastatin (ZOCOR) 20 MG tablet Take 20 mg by mouth every evening.    [provider]  vitamin B-12 (CYANOCOBALAMIN) 1000 MCG tablet Take 2,000 mcg by mouth daily.    [provider]    Current Facility-Administered Medications  Medication Dose Route Frequency Provider Last Rate Last Admin   acetaminophen (TYLENOL) tablet 650 mg  650 mg Oral Q6H PRN Emokpae, Courage, MD       docusate sodium (COLACE) capsule 100 mg  100 mg Oral Once Adefeso, Oladapo, DO       feeding supplement (ENSURE ENLIVE / ENSURE PLUS) liquid 237 mL  237 mL Oral BID BM Adefeso, Oladapo, DO       fentaNYL (SUBLIMAZE) injection 25 mcg  25 mcg Intravenous Q4H PRN Emokpae, Courage, MD       finasteride (PROSCAR) tablet 5 mg  5 mg Oral Daily Dorothea Ogle B, RPH   5 mg at 03/31/23 0832   [START ON 04/02/2023]  furosemide (LASIX) tablet 40 mg  40 mg Oral Daily Emokpae, Courage, MD       levothyroxine (SYNTHROID) tablet 25 mcg  25 mcg Oral Q0600 Tressie Ellis, [facility]  25 mcg at 03/31/23 0602   melatonin tablet 3 mg  3 mg Oral QHS Adefeso, Oladapo, DO   3 mg at 03/30/23 2224   oxyCODONE (Oxy IR/ROXICODONE) immediate release tablet 5 mg  5 mg Oral Q4H PRN Shon Hale, MD   5 mg at 03/31/23 0833   pantoprazole (PROTONIX) injection 40 mg  40 mg Intravenous Q12H Adefeso, Oladapo, DO   40 mg at 03/31/23 0831   potassium chloride SA (KLOR-CON M) CR tablet 20 mEq  20 mEq Oral Once Adefeso, Oladapo, DO       prochlorperazine (COMPAZINE) injection 10 mg  10 mg Intravenous Q6H PRN Adefeso, Oladapo, DO       simvastatin (ZOCOR) tablet 20 mg  20 mg Oral QPM Adefeso, Oladapo, DO   20 mg at 03/30/23 2223   tamsulosin (FLOMAX) capsule 0.4 mg  0.4 mg Oral QPC supper Adefeso, Oladapo, DO        Allergies as of 03/30/2023 - Review Complete 03/30/2023  Allergen Reaction Noted   Norvasc [amlodipine besylate] Swelling 09/19/2014   Codeine Nausea And Vomiting 11/06/2011   Amoxicillin Rash 12/16/2021    Family History  Problem Relation Age of Onset   Heart failure Mother    Heart attack Father    Heart failure Brother        bladder cancer (agent orange exposure)   Cirrhosis Brother        chronic viral hepatitis   Colon cancer Neg Hx     Social History   Socioeconomic History   Marital status: Married    Spouse name: Not on file   Number of children: 2   Years of education: Not on file   Highest education level: Not on file  Occupational History   Occupation: Retired    Associate Professor: [employer]  Tobacco Use   Smoking status: Former    Current packs/day: 0.00    Average packs/day: 1.5 packs/day for 40.0 years (60.0 ttl pk-yrs)    Types: Cigarettes    Start date: 11/04/1957    Quit date: 11/04/1997    Years since quitting: 25.4    Passive exposure: Never   Smokeless tobacco: Never   Tobacco  comments:    Quit in 1999  Vaping Use   Vaping status: Never Used  Substance and Sexual Activity   Alcohol use: No    Alcohol/week: 0.0 standard drinks of alcohol   Drug use: No   Sexual activity: Yes  Other Topics Concern   Not on file  Social History Narrative   Married with 2 children   Lives locally   No regular exercise   Social Determinants of Health   Financial Resource Strain: Not on file  Food Insecurity: No Food Insecurity (03/30/2023)   Hunger Vital Sign    Worried About Running Out of Food in the Last Year: Never true    Ran Out of Food in the Last Year: Never true  Transportation Needs: No Transportation Needs (03/30/2023)   PRAPARE - Administrator, Civil Service (Medical): No    Lack of Transportation (Non-Medical): No  Physical Activity: Not on file  Stress: Not on file  Social Connections: Not on file  Intimate Partner Violence: Not At Risk (03/30/2023)   Humiliation, Afraid, Rape, and Kick questionnaire    Fear of Current or Ex-Partner: No    Emotionally Abused: No    Physically Abused: No    Sexually Abused: No    Review of Systems: Gen:  Denies fever, chills, cold or flulike symptoms, presyncope, syncope. CV: Denies chest pain, heart palpitations. Resp: Denies shortness of breath, cough. GI: See HPI GU : Denies urinary burning, urinary frequency, urinary incontinence.  MS: Reports back pain. Heme: See HPI.  Physical Exam: Vital signs in last 24 hours: Temp:  [97.2 F (36.2 C)-98.8 F (37.1 C)] 97.9 F (36.6 C) (11/05 0746) Pulse Rate:  [77-100] 84 (11/05 0746) Resp:  [16-20] 16 (11/05 0456) BP: (99-122)/(52-68) 115/54 (11/05 0746) SpO2:  [93 %-100 %] 97 % (11/05 0746) FiO2 (%):  [21 %] 21 % (11/04 2009) Weight:  [71.7 kg] 71.7 kg (11/04 1643) Last BM Date : 03/30/23 General:   Alert,  Well-developed, well-nourished, pleasant and cooperative, in bedside recliner.  Head:  Normocephalic and atraumatic. Eyes:  Sclera clear, no  icterus.   Conjunctiva pink. Ears: Decreased auditory acuity.  Hearing aids in place.   Lungs:  Clear throughout to auscultation.   No wheezes, crackles, or rhonchi. No acute distress. Heart:  Regular rate and rhythm; no murmurs, clicks, rubs,  or gallops. Abdomen:  Soft, non-distended. Mild to moderate TTP in the lower abdomen, but RLQ worse than LLQ. Mild TTP in epigastric area. No masses, hepatosplenomegaly or hernias noted. Hypoactive bowel sounds. No guarding or rebound.   Rectal:  Deferred as patient was in bedside recliner. Msk:  Symmetrical without gross deformities. Normal posture. Pulses:  Normal pulses noted. Extremities:  Without edema. Neurologic:  Alert and  oriented x4. Psych:  Normal mood and affect, but irritable due to back pain and wanting to get back in bed.   Intake/Output from previous day: No intake/output data recorded. Intake/Output this shift: No intake/output data recorded.  Lab Results: Recent Labs    03/30/23 1817 03/31/23 0423  WBC 14.8* 12.4*  HGB 13.5 11.7*  HCT 40.1 35.2*  PLT 369 332   BMET Recent Labs    03/30/23 1817 03/31/23 0423  NA 138 135  K 3.0* 2.9*  CL 101 104  CO2 25 24  GLUCOSE 131* 129*  BUN 18 18  CREATININE 0.93 1.03  CALCIUM 8.3* 7.9*   LFT Recent Labs    03/30/23 1817 03/31/23 0423  PROT 5.9* 5.2*  ALBUMIN 2.6* 2.3*  AST 24 19  ALT 25 22  ALKPHOS 132* 113  BILITOT 0.9 0.8    Impression: 82 y.o. 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  Upon arrival, patient also reporting rectal bleeding.  GI consulted for further evaluation of rectal  bleeding.  Rectal bleeding: Had been doing well without rectal bleeding since his last colonoscopy in 2022 until receiving an enema in the emergency room on 10/29 for constipation.  Reports he had intermittent bright red blood per rectum daily since 10/30, typically occurring when he passes gas. He has passed very little stool. He has also had some lower abdominal pain since his fall last week. Hgb on admission 13.5, down to 11.7 this morning with last episode of rectal bleeding this morning (small amount). Decline in Hgb likely influenced by hemodilution following IV fluids yesterday as all cell lines declined.   Considering abdominal pain, unable to rule out colitis, specifically stercoral colitis in  light of constipation. Otherwise, rectal bleeding may very well be related to known radiation proctitis. I have recommended CT A/P due to abdominal pain. Ultimately would recommend flex sig for further evaluation of rectal bleeding pending CT as well as correction of electrolytes.   Constipation:  Likely secondary to opioid pain medications for back pain. Will start MiraLAX daily for now  Hypokalemia:  K 2.9 this morning. Correction per hospitalist.   Epigastric burning:  New onset over the last week when taking medications. Likely related to GERD as PPI was decreased to once daily on 10/8. Currently doing well on BID dosing since admission. Recommend continuing this at discharge. No indication for repeat EGD. Last EGD in August 2024 with Schatzki's ring and reflux changes in the esophagus.  Plan: CT A/P with contrast  Would be ok to have clear liquids today following CT.  NPO at midnight for possible flex sig tomorrow.  Monitor for ongoing rectal bleeding. Monitor H/H. Transfuse for Hgb <7.  MiraLAX 17 g BID once diet resumed following CT.  Continue PPI BID.  Correction of hypokalemia per hospitalist.   LOS: 1 day    03/31/2023, 12:23 PM   Ermalinda Memos, PA-C [facility]  Gastroenterology

## 2023-03-31 NOTE — TOC Progression Note (Signed)
Transition of Care [facility]) - Progression Note    Patient Details  Name: Calvin Moreno MRN: [id number] Date of Birth: [birth year]  [facility] of [facility]) CM/SW Contact  Leitha Bleak, RN Phone Number: 03/31/2023, 3:37 PM  Clinical Narrative    Bed offers, Wife will call back to give [facility] the SNF of choice.   Expected Discharge Plan: Skilled Nursing Facility Barriers to Discharge: Continued Medical Work up  Expected Discharge Plan and Services In-house Referral: Clinical Social Work Discharge Planning Services: CM Consult Post Acute Care Choice: Skilled Nursing Facility Living arrangements for the past 2 months: Single Family Home                      Social Determinants of Health (SDOH) Interventions SDOH Screenings   Food Insecurity: No Food Insecurity (03/30/2023)  Housing: Low Risk  (03/30/2023)  Transportation Needs: No Transportation Needs (03/30/2023)  Utilities: Not At Risk (03/30/2023)  Tobacco Use: Medium Risk (03/30/2023)    Readmission Risk Interventions    03/31/2023   11:44 AM  Readmission Risk Prevention Plan  Transportation Screening Complete  Home Care Screening Complete  Medication Review (RN CM) Complete

## 2023-04-01 ENCOUNTER — Encounter (HOSPITAL_COMMUNITY)
Admission: EM | Disposition: A | Discharge status: Skilled Nursing Facility | Payer: Self-pay | Source: Ambulatory Visit | Attending: Internal Medicine

## 2023-04-01 DIAGNOSIS — S32001D Stable burst fracture of unspecified lumbar vertebra, subsequent encounter for fracture with routine healing: Secondary | ICD-10-CM | POA: Diagnosis not present

## 2023-04-01 DIAGNOSIS — K219 Gastro-esophageal reflux disease without esophagitis: Secondary | ICD-10-CM | POA: Diagnosis not present

## 2023-04-01 DIAGNOSIS — K922 Gastrointestinal hemorrhage, unspecified: Secondary | ICD-10-CM | POA: Diagnosis not present

## 2023-04-01 DIAGNOSIS — E782 Mixed hyperlipidemia: Secondary | ICD-10-CM | POA: Diagnosis not present

## 2023-04-01 LAB — COMPREHENSIVE METABOLIC PANEL
ALT: 19 U/L (ref 0–44)
AST: 16 U/L (ref 15–41)
Albumin: 2.3 g/dL — ABNORMAL LOW (ref 3.5–5.0)
Alkaline Phosphatase: 107 U/L (ref 38–126)
Anion gap: 5 (ref 5–15)
BUN: 12 mg/dL (ref 8–23)
CO2: 26 mmol/L (ref 22–32)
Calcium: 8.1 mg/dL — ABNORMAL LOW (ref 8.9–10.3)
Chloride: 105 mmol/L (ref 98–111)
Creatinine, Ser: 0.88 mg/dL (ref 0.61–1.24)
GFR, Estimated: >60 mL/min (ref >60)
Glucose, Bld: 95 mg/dL (ref 70–99)
Potassium: 4.1 mmol/L (ref 3.5–5.1)
Sodium: 136 mmol/L (ref 135–145)
Total Bilirubin: 0.9 mg/dL (ref <1.2)
Total Protein: 5.1 g/dL — ABNORMAL LOW (ref 6.5–8.1)

## 2023-04-01 LAB — CBC
HCT: 30.6 % — ABNORMAL LOW (ref 39.0–52.0)
Hemoglobin: 10.2 g/dL — ABNORMAL LOW (ref 13.0–17.0)
MCH: 30.4 pg (ref 26.0–34.0)
MCHC: 33.3 g/dL (ref 30.0–36.0)
MCV: 91.1 fL (ref 80.0–100.0)
Platelets: 305 10*3/uL (ref 150–400)
RBC: 3.36 MIL/uL — ABNORMAL LOW (ref 4.22–5.81)
RDW: 13 % (ref 11.5–15.5)
WBC: 9.9 10*3/uL (ref 4.0–10.5)
nRBC: 0 % (ref 0.0–0.2)

## 2023-04-01 SURGERY — FLEXIBLE SIGMOIDOSCOPY
Anesthesia: Monitor Anesthesia Care

## 2023-04-01 MED ORDER — SODIUM CHLORIDE 0.9 % IV SOLN: INTRAVENOUS | Status: DC

## 2023-04-01 MED ORDER — LIDOCAINE HCL URETHRAL/MUCOSAL 2 % EX GEL: 1.0000 | Freq: Two times a day (BID) | CUTANEOUS | Status: DC | PRN

## 2023-04-01 NOTE — Progress Notes (Addendum)
PROGRESS NOTE  Calvin Moreno, is a 82 y.o. male, DOB - 09-08-1940, MVH:[id number]  Admit date - 03/30/2023   Admitting Physician Frankey Shown, DO  Outpatient Primary MD for the patient is Benita Stabile, MD  LOS - 2  Chief Complaint  Patient presents with   Fall       Brief Narrative:   82 y.o. male with medical history significant of hypertension, HLD, hypothyroidism, GERD, hearing impairment and history of stroke admitted on 03/30/2023 with back pain due to lumbar burst fracture and concerns for GI bleed    -Assessment and Plan: 1)Lumbar burst fracture/s/p Fall at home -Patient believes that he fell due to accidental overdose of Norco which caused him to lose balance prior to the fall.  -EDP Discussed with neurosurgical PA Ms Meyren who recommends TLSO brace, and outpatient neurosurgical follow-up after completing rehab -- Remains high risk for falls.   -Continue judicious analgesic therapy. 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  3)GERD--- with H/o  Schatzki's ring s/p dilation,  -Consult appreciated. -continue Protonix. -Lifestyle discussion sustained with patient  4)Hypothyroidism---c/n Levothyroxine  5)H/o prostate cancer s/p XRT --- continue Flomax and Proscar -Patient denies complaint of urinary retention symptoms.  6)History of prior stroke-hold aspirin due to concerns for GI bleed, follow clearance by GI prior to resume aspirin for secondary prevention. -No focal deficits.   7)Social/Ethics--- plan of care discussed with son from [place] at  bedside.  8) lung nodules -Appreciated on CT -Outpatient follow-up with repeat images in 1-105-month per radiology recommendations; based on findings at that time further discussed the need of lung biopsy. -2.4 cm spiculated nodule in the anterior left upper lobe highly suspicious for primary bronchogenic carcinoma. -pulmonary service referral will be arranged.  Status is: Inpatient   Disposition: The patient is from: Home              Anticipated d/c is to: SNF              Anticipated d/c date is: 1-2 days              Patient currently is not medically stable to d/c. Barriers: Not Clinically Stable-   Code Status :  -  Code Status: Full Code   Family Communication:   (patient is alert, awake and coherent) Sister and son updated at bedside.  DVT Prophylaxis  :   - SCDs  SCDs Start: 03/30/23 2012   Lab Results  Component Value Date   PLT 305 04/01/2023   Inpatient Medications  Scheduled Meds:  docusate sodium  100 mg Oral Once   feeding supplement  237 mL Oral BID BM   finasteride  5 mg Oral Daily   [START ON 04/02/2023] furosemide  40 mg Oral Daily   levothyroxine  25 mcg Oral Q0600   melatonin  3 mg Oral QHS   methocarbamol  500 mg Oral TID   pantoprazole (PROTONIX) IV  40 mg Intravenous Q12H   polyethylene glycol  17 g Oral BID   potassium chloride  20 mEq Oral Once   senna-docusate  2 tablet Oral QHS   tamsulosin  0.4 mg Oral QPC supper   Continuous Infusions:  sodium chloride     PRN Meds:.acetaminophen, ALPRAZolam, fentaNYL (SUBLIMAZE) injection, lidocaine, oxyCODONE, prochlorperazine   Anti-infectives (From admission, onward)    None      Subjective: Indigo Turay afebrile, no nausea, no vomiting, no chest pain.  No overt bleeding reported.  Continue 2 sprays lumbar spine tenderness with movement, palpation and associated lower extremity weakness.   Objective: Vitals:   03/31/23 1343 03/31/23 2003 04/01/23 0436 04/01/23 1330  BP: 134/78 106/64 (!)  103/50 (!) 120/56  Pulse: 68 88 87 93  Resp:  18 18 20   Temp: 97.7 F (36.5 C) 98.9 F (37.2 C) 97.6 F (36.4 C) 98.3 F (36.8 C)  TempSrc: Oral Oral Oral Oral  SpO2: 100% 96% 96% 97%  Weight:      Height:        Intake/Output Summary (Last 24 hours) at 04/01/2023 1801 Last data filed at 04/01/2023 1500 Gross per 24 hour  Intake 120 ml  Output 1450 ml  Net -1330 ml   Filed Weights   03/30/23 1643  Weight: 71.7 kg   Physical Exam General exam: Alert, awake, following commands and in no acute distress. Respiratory system: Clear to auscultation. Respiratory effort normal.  Good saturation on room air. Cardiovascular system:RRR. No rubs or gallops. Gastrointestinal system: Abdomen is nondistended, soft and with positive bowel sounds. Central nervous system: Demonstrating generalized weakness, lumbar spine discomfort on palpation/movement and having lower extremity weakness.   Extremities: No cyanosis or clubbing. Skin: No petechiae. Psychiatry: Mood & affect appropriate.   Data Reviewed: I have personally reviewed following labs and imaging studies  CBC: Recent Labs  Lab 03/30/23 1817 03/31/23 0423 03/31/23 1323 04/01/23 0419  WBC 14.8* 12.4*  --  9.9  NEUTROABS 12.8*  --   --   --   HGB 13.5 11.7* 12.3* 10.2*  HCT 40.1 35.2* 37.7* 30.6*  MCV 89.1 89.6  --  91.1  PLT 369 332  --  305   Basic Metabolic Panel: Recent Labs  Lab 03/30/23 1817 03/31/23 0423 04/01/23 0505  NA 138 135 136  K 3.0* 2.9* 4.1  CL 101 104 105  CO2 25 24 26   GLUCOSE 131* 129* 95  BUN 18 18 12   CREATININE 0.93 1.03 0.88  CALCIUM 8.3* 7.9* 8.1*  MG  --  2.1  --   PHOS  --  3.1  --    GFR: Estimated Creatinine Clearance: 57.4 mL/min (by C-G formula based on SCr of 0.88 mg/dL).  Liver Function Tests: Recent Labs  Lab 03/30/23 1817 03/31/23 0423 04/01/23 0505  AST 24 19 16   ALT 25 22 19   ALKPHOS 132* 113 107  BILITOT 0.9 0.8 0.9  PROT 5.9* 5.2* 5.1*  ALBUMIN 2.6* 2.3* 2.3*    Scheduled Meds:  docusate sodium  100 mg Oral Once   feeding supplement  237 mL Oral BID BM   finasteride  5 mg Oral Daily   [START ON 04/02/2023] furosemide  40 mg Oral Daily   levothyroxine  25 mcg Oral Q0600   melatonin  3 mg Oral QHS   methocarbamol  500 mg Oral TID   pantoprazole (PROTONIX) IV  40 mg Intravenous Q12H   polyethylene glycol  17 g Oral BID   potassium chloride  20 mEq Oral Once   senna-docusate  2 tablet Oral QHS   tamsulosin  0.4 mg Oral QPC supper   Continuous Infusions:  sodium chloride  LOS: 2 days    Vassie Loll M.D on 04/01/2023 at 6:01 PM  Go to [email or web address] - for contact info  [facility] - Office  [phone]  If 7PM-7AM, please contact night-coverage [email or web address] 04/01/2023, 6:01 PM

## 2023-04-01 NOTE — TOC Progression Note (Signed)
Transition of Care [facility]) - Progression Note    Patient Details  Name: ORRIE SCHUBERT MRN: [id number] Date of Birth: 1940-05-31  Transition of Care [facility]) CM/SW Contact  Leitha Bleak, RN Phone Number: 04/01/2023, 11:33 AM  Clinical Narrative:   CM at the bedside to discuss bed offer with patient and his son. [place]on requested TOC expand the search and had a list. TOC expanded search and got an offer at [facility], one he requested. CM called to discuss, they will tour the facility this afternoon and update TOC. CM explained the discharge plan is for tomorrow. TOC following.     Expected Discharge Plan: Skilled Nursing Facility Barriers to Discharge: Continued Medical Work up  Expected Discharge Plan and Services In-house Referral: Clinical Social Work Discharge Planning Services: CM Consult Post Acute Care Choice: Skilled Nursing Facility Living arrangements for the past 2 months: Single Family Home                   Social Determinants of Health (SDOH) Interventions SDOH Screenings   Food Insecurity: No Food Insecurity (03/30/2023)  Housing: Low Risk  (03/30/2023)  Transportation Needs: No Transportation Needs (03/30/2023)  Utilities: Not At Risk (03/30/2023)  Tobacco Use: Medium Risk (03/30/2023)    Readmission Risk Interventions    03/31/2023   11:44 AM  Readmission Risk Prevention Plan  Transportation Screening Complete  Home Care Screening Complete  Medication Review (RN CM) Complete

## 2023-04-01 NOTE — Plan of Care (Signed)
  Problem: Clinical Measurements: Goal: Diagnostic test results will improve Outcome: Not Progressing   Problem: Clinical Measurements: Goal: Will remain free from infection Outcome: Not Progressing   Problem: Pain Management: Goal: General experience of comfort will improve Outcome: Not Progressing   Problem: Elimination: Goal: Will not experience complications related to bowel motility Outcome: Not Progressing

## 2023-04-01 NOTE — Progress Notes (Signed)
When medication was given thismorning, patient voicedhe was upset regarding not being allowed to eat/drink until after his procedure.  Informed and reminded him repeatedly that he needed to remain NPO given the anesthesia he wasgoing to have during the procedure in addition to potential complications from procedure that would may require higher levels of anesthesia and/or care.  Son at bedside also having to remind him repeatedly what/why he needed to remain NPO however patient repeatedly voiced he didn't need to be NPO and wanted something to drink.  Patient was given oral swabs to moisten and instructed on use which he verbalized understanding of in addition to son voicing understanding.Earlier today patient instructed that he needed to have an enema for his procedure to be done today.  Patient refused enemas because "he had diarrhea multiple times yesterday and last night and his butt was sore".  Patient has intermittently had periods through this shift where he is confused/forgetful of conversations that have taken place.  This evening patient's son, significant other, and patient requested to talk with this nurse regarding plan for procedure tomorrow, enema,diet orders until midnight, and medications.  Patient and his son voiced they were upset regarding him not receiving the enemas as ordered causing another day delay to havehis procedure done.  Patient does not recall encounter with nurse regarding enemas, instructions/directions of care along with rationale, and what has been done.  Patient also was arguing with son regarding conversations that took place earlier today with nursing staff that son was present for and was adamant conversations did not take place including regarding the temperature of his room

## 2023-04-01 NOTE — TOC Progression Note (Signed)
Transition of Care [facility]) - Progression Note    Patient Details  Name: Calvin Moreno MRN: [id number] Date of Birth: 01/13/1941  Transition of Care [facility]) CM/SW Contact  Leitha Bleak, RN Phone Number: 04/01/2023, 3:06 PM  Clinical Narrative:   Son is requesting [place], [place] called Michel Santee  [phone]. She made a bed offer. CM updated [place] ( Son). TOC following for possible DC to facility tomorrow.    Expected Discharge Plan: Skilled Nursing Facility Barriers to Discharge: Continued Medical Work up  Expected Discharge Plan and Services In-house Referral: Clinical Social Work Discharge Planning Services: CM Consult Post Acute Care Choice: Skilled Nursing Facility Living arrangements for the past 2 months: Single Family Home                     Social Determinants of Health (SDOH) Interventions SDOH Screenings   Food Insecurity: No Food Insecurity (03/30/2023)  Housing: Low Risk  (03/30/2023)  Transportation Needs: No Transportation Needs (03/30/2023)  Utilities: Not At Risk (03/30/2023)  Tobacco Use: Medium Risk (03/30/2023)    Readmission Risk Interventions    03/31/2023   11:44 AM  Readmission Risk Prevention Plan  Transportation Screening Complete  Home Care Screening Complete  Medication Review (RN CM) Complete

## 2023-04-01 NOTE — Progress Notes (Addendum)
I was notified around 1340 by Dr. Jena Gauss that nursing staff advised him that patient refused enemas for flexible sigmoidoscopy. I spoke with patient who advised no one ever asked him about doing enemas and that he did not refuse. Son and son's significant other at bedside report they have been with patient most of the day other than stepping out around 1130 for lunch and did not note anyone offering to do enemas. Patient and family very upset as flex sig today had to be cancelled due to no prep. Dr. Gwenlyn Perking came by during the encounter, thorough discussion had with patient and family regarding plan of care.  We will order diet for patient, NPO at midnight and plan for flex sig again tomorrow. I reiterated multiple times he will need to prep for procedure with enemas in the morning. All parties were in agreement with this.   Mantaj Chamberlin L. Jeanmarie Hubert, MSN, APRN, AGNP-C Adult-Gerontology Nurse Practitioner [facility] Gastroenterology at [facility]

## 2023-04-01 NOTE — Progress Notes (Signed)
Received call from Penny Pia regarding TLSO brace, he will follow up in morning.

## 2023-04-01 NOTE — Progress Notes (Addendum)
Short stay nurse called to get report on Mr. Flavell prior to bringing him to short stay.   Butler Denmark, RN stated that patient refused his enemas. Dr. Jena Gauss was consulted and he cancelled the procedure due to patient's lack of compliance. Larey Days, RN was notified that the procedure was cancelled.

## 2023-04-01 NOTE — Progress Notes (Signed)
Briefly, patient presenting with recurrent rectal bleeding and abdominal pain.  CT A/P yesterday with mild rectal wall thickening, raising concern for infectious/inflammatory proctitis, though would favor inflammatory/radiation related given history of radiation proctitis s/p APC ablation in the past.   He was started on miralax yesterday for constipation and recommended to have flexible sigmoidoscopy today for further evaluation, pending results of CT imaging. Hgb down to 10.2 this morning from 12.3 yesterday though may be some aspect of hemodilution. Notably potassium was 2.9 yesterday, will recheck CMP to ensure substantial repletion and Will plan for flexible sigmoidoscopy later today for further evaluation as long as potassium sufficiently repleted.   Indications, risks and benefits of procedure discussed in detail with patient and family. Patient verbalized understanding and is in agreement to proceed with Flexible sigmoidoscopy.   Randye Treichler L. Jeanmarie Hubert, MSN, APRN, AGNP-C Adult-Gerontology Nurse Practitioner [facility] Gastroenterology at [facility]

## 2023-04-02 ENCOUNTER — Telehealth: Payer: Self-pay | Admitting: Gastroenterology

## 2023-04-02 ENCOUNTER — Inpatient Hospital Stay (HOSPITAL_COMMUNITY): Payer: Medicare Other | Admitting: Certified Registered"

## 2023-04-02 ENCOUNTER — Encounter (HOSPITAL_COMMUNITY): Payer: Self-pay | Admitting: Internal Medicine

## 2023-04-02 ENCOUNTER — Encounter (HOSPITAL_COMMUNITY)
Admission: EM | Disposition: A | Discharge status: Skilled Nursing Facility | Payer: Self-pay | Source: Ambulatory Visit | Attending: Internal Medicine

## 2023-04-02 DIAGNOSIS — K5520 Angiodysplasia of colon without hemorrhage: Secondary | ICD-10-CM

## 2023-04-02 DIAGNOSIS — K626 Ulcer of anus and rectum: Secondary | ICD-10-CM

## 2023-04-02 DIAGNOSIS — R933 Abnormal findings on diagnostic imaging of other parts of digestive tract: Secondary | ICD-10-CM

## 2023-04-02 DIAGNOSIS — K5730 Diverticulosis of large intestine without perforation or abscess without bleeding: Secondary | ICD-10-CM

## 2023-04-02 DIAGNOSIS — K625 Hemorrhage of anus and rectum: Secondary | ICD-10-CM | POA: Diagnosis not present

## 2023-04-02 DIAGNOSIS — I129 Hypertensive chronic kidney disease with stage 1 through stage 4 chronic kidney disease, or unspecified chronic kidney disease: Secondary | ICD-10-CM

## 2023-04-02 DIAGNOSIS — S32001D Stable burst fracture of unspecified lumbar vertebra, subsequent encounter for fracture with routine healing: Secondary | ICD-10-CM | POA: Diagnosis not present

## 2023-04-02 DIAGNOSIS — N400 Enlarged prostate without lower urinary tract symptoms: Secondary | ICD-10-CM | POA: Diagnosis not present

## 2023-04-02 DIAGNOSIS — E039 Hypothyroidism, unspecified: Secondary | ICD-10-CM | POA: Diagnosis not present

## 2023-04-02 DIAGNOSIS — N189 Chronic kidney disease, unspecified: Secondary | ICD-10-CM

## 2023-04-02 DIAGNOSIS — K219 Gastro-esophageal reflux disease without esophagitis: Secondary | ICD-10-CM | POA: Diagnosis not present

## 2023-04-02 HISTORY — PX: FLEXIBLE SIGMOIDOSCOPY: SHX5431

## 2023-04-02 HISTORY — PX: HOT HEMOSTASIS: SHX5433

## 2023-04-02 SURGERY — SIGMOIDOSCOPY, FLEXIBLE
Anesthesia: General

## 2023-04-02 MED ORDER — TRAMADOL HCL 50 MG PO TABS: 50.0000 mg | ORAL_TABLET | Freq: Three times a day (TID) | ORAL | 0 refills | Status: DC | PRN

## 2023-04-02 MED ORDER — POLYETHYLENE GLYCOL 3350 17 G PO PACK: 17.0000 g | PACK | Freq: Every day | ORAL | Status: DC

## 2023-04-02 MED ORDER — ALPRAZOLAM 0.5 MG PO TABS: 0.5000 mg | ORAL_TABLET | Freq: Every evening | ORAL | 0 refills | Status: DC | PRN

## 2023-04-02 MED ORDER — MECLIZINE HCL 12.5 MG PO TABS: 12.5000 mg | ORAL_TABLET | Freq: Three times a day (TID) | ORAL | Status: DC | PRN

## 2023-04-02 MED ORDER — PSYLLIUM 95 % PO PACK: 1.0000 | PACK | Freq: Two times a day (BID) | ORAL | Status: DC

## 2023-04-02 MED ORDER — SUCRALFATE 1 GM/10ML PO SUSP: ORAL | Status: DC

## 2023-04-02 MED ORDER — PHENYLEPHRINE 80 MCG/ML (10ML) SYRINGE FOR IV PUSH (FOR BLOOD PRESSURE SUPPORT)
PREFILLED_SYRINGE | INTRAVENOUS | Status: AC
  Filled 2023-04-02: qty 10

## 2023-04-02 MED ORDER — PHENYLEPHRINE 80 MCG/ML (10ML) SYRINGE FOR IV PUSH (FOR BLOOD PRESSURE SUPPORT)
PREFILLED_SYRINGE | INTRAVENOUS | Status: DC | PRN
  Administered 2023-04-02: 240 ug via INTRAVENOUS
  Administered 2023-04-02 (×3): 160 ug via INTRAVENOUS

## 2023-04-02 MED ORDER — LIDOCAINE HCL (PF) 2 % IJ SOLN
INTRAMUSCULAR | Status: DC | PRN
  Administered 2023-04-02: 50 mg via INTRADERMAL

## 2023-04-02 MED ORDER — OXYCODONE HCL 5 MG PO TABS: 5.0000 mg | ORAL_TABLET | Freq: Four times a day (QID) | ORAL | 0 refills | Status: DC | PRN

## 2023-04-02 MED ORDER — ACETAMINOPHEN 500 MG PO TABS
1000.0000 mg | ORAL_TABLET | Freq: Three times a day (TID) | ORAL | 2 refills | Status: AC | PRN
Start: 2023-04-02 — End: 2024-04-01

## 2023-04-02 MED ORDER — METHOCARBAMOL 500 MG PO TABS: 500.0000 mg | ORAL_TABLET | Freq: Three times a day (TID) | ORAL | Status: DC | PRN

## 2023-04-02 MED ORDER — LACTATED RINGERS IV SOLN: INTRAVENOUS | Status: DC | PRN

## 2023-04-02 MED ORDER — FENTANYL CITRATE (PF) 100 MCG/2ML IJ SOLN
INTRAMUSCULAR | Status: AC
  Filled 2023-04-02: qty 2

## 2023-04-02 MED ORDER — FENTANYL CITRATE (PF) 100 MCG/2ML IJ SOLN
INTRAMUSCULAR | Status: DC | PRN
  Administered 2023-04-02 (×2): 50 ug via INTRAVENOUS

## 2023-04-02 MED ORDER — DEXMEDETOMIDINE HCL IN NACL 80 MCG/20ML IV SOLN
INTRAVENOUS | Status: DC | PRN
  Administered 2023-04-02 (×2): 8 ug via INTRAVENOUS

## 2023-04-02 MED ORDER — PROPOFOL 10 MG/ML IV BOLUS
INTRAVENOUS | Status: DC | PRN
  Administered 2023-04-02: 30 mg via INTRAVENOUS

## 2023-04-02 MED ORDER — EPHEDRINE 5 MG/ML INJ
INTRAVENOUS | Status: AC
Start: 2023-04-02 — End: ?
  Filled 2023-04-02: qty 5

## 2023-04-02 MED ORDER — TRAMADOL HCL 50 MG PO TABS: 50.0000 mg | ORAL_TABLET | Freq: Four times a day (QID) | ORAL | 0 refills | Status: DC | PRN

## 2023-04-02 MED ORDER — DOCUSATE SODIUM 100 MG PO CAPS: 100.0000 mg | ORAL_CAPSULE | Freq: Two times a day (BID) | ORAL | 0 refills | Status: DC

## 2023-04-02 MED ORDER — SODIUM CHLORIDE 0.9 % IV SOLN: INTRAVENOUS | Status: DC | PRN

## 2023-04-02 NOTE — Progress Notes (Signed)
Patient underwent Flexible Sigmoidoscopy under propofol sedation.  Tolerated the procedure adequately.   FINDINGS:  - Preparation of the colon was fair.  - Induration and firmness found on digital rectal exam.  - Large Mucosal ulceration in the rectum. Biopsied.  - A few non- bleeding colonic angioectasias. Treated with argon plasma coagulation ( APC) .  RECOMMENDATIONS  -Start Sucralfate enemas to augment in healing of rectal ulcer and possible radiation proctitis - High fiber diet , Metamucil BID  -Restart diet  -Follow up biopsy results  -Consider colorectal suurgey evaluation if biopsies non- diagnostic  -May need repeat Flex sig in 1- 2 months to evaluate for healing of the ulcer  Spoke with patient wife over the phone [place], Marney (Spouse) [phone] (Home Phone)   Vista Lawman, MD Gastroenterology and Hepatology [facility] Gastroenterology

## 2023-04-02 NOTE — TOC Progression Note (Signed)
Transition of Care [facility]) - Progression Note    Patient Details  Name: ALMER BUSHEY MRN: [id number] Date of Birth: Sep 18, 1940  Transition of [facility]) CM/SW Contact  Geni Bers, RN Phone Number: 04/02/2023, 2:23 PM  Clinical Narrative:     A called to pt's wife and son was made left VM. A call was made to pt's daughter also. Spoke with pt's son who states that they would refer EMS to take pt to [facility]. AC at [facility] aware.   Expected Discharge Plan: Skilled Nursing Facility Barriers to Discharge: Continued Medical Work up  Expected Discharge Plan and Services In-house Referral: Clinical Social Work Discharge Planning Services: CM Consult Post Acute Care Choice: Skilled Nursing Facility Living arrangements for the past 2 months: Single Family Home Expected Discharge Date: 04/02/23                                     Social Determinants of Health (SDOH) Interventions SDOH Screenings   Food Insecurity: No Food Insecurity (03/30/2023)  Housing: Low Risk  (03/30/2023)  Transportation Needs: No Transportation Needs (03/30/2023)  Utilities: Not At Risk (03/30/2023)  Tobacco Use: Medium Risk (04/02/2023)    Readmission Risk Interventions    03/31/2023   11:44 AM  Readmission Risk Prevention Plan  Transportation Screening Complete  Home Care Screening Complete  Medication Review (RN CM) Complete

## 2023-04-02 NOTE — Telephone Encounter (Signed)
Patient needs hospital follow up with Dr. Jena Gauss ONLY in 3-4 weeks

## 2023-04-02 NOTE — Discharge Summary (Signed)
Physician Discharge Summary   Patient: Calvin Moreno MRN: [id number] DOB: 10-Jun-1940  Admit date:     03/30/2023  Discharge date: 04/02/23  Discharge Physician: Vassie Loll   PCP: Benita Stabile, MD   Recommendations at discharge:  Outpatient follow-up with gastroenterology service Outpatient follow-up with pulmonology As needed follow-up with neurosurgery based on patient's response to rehabilitation and pain management. Repeat basic metabolic panel to follow ultralights renal function Repeat CBC to follow hemoglobin trend/stability Goals of care discussion and advance care planning recommended.  Discharge Diagnoses: Principal Problem:   Lumbar burst fracture (HCC) Active Problems:   INSOMNIA   Mixed hyperlipidemia   Essential hypertension   BPH (benign prostatic hyperplasia)   Hypokalemia   GERD (gastroesophageal reflux disease)   Prolonged QT interval   Fall at home, initial encounter   Acquired hypothyroidism   GI bleed  Brief Hospital admission course: As per H&P written by Dr. Thomes Dinning on 03/30/2023. Calvin Moreno is a 82 y.o. male with medical history significant of hypertension, hyperlipidemia, hypothyroidism, GERD, hearing impairment and history of stroke who presents to the emergency department after being asked to go to the ED for further evaluation and management by PCP due to CT evidence of L4 burst fracture.   He was seen in the ED on 10/29 after sustaining a fall at home, but he left AMA prior to completion of workup.  CT of the lumbar spine done at that time showed a burst fracture of L4.  He presented to PCP today and patient was sent to the ED for further pain control and possible discharge to a rehab. Patient believes that he fell due to accidental overdose of Norco which caused him to lose balance prior to the fall. Patient complained of back pain and family complained of progressive decline and loss of appetite per ED medical record.  He endorsed  constipation, but denies chest pain, shortness of breath, nausea, vomiting, headache, fever, chills.   ED Course:  In the emergency department, patient was hemodynamically stable.  Workup in the ED showed normal CBC except for WBC of 14.8.  BMP was normal except for potassium of 3.0, blood glucose 131, albumin 2.6.  Urinalysis was normal.  FOBT was positive CT lumbar spine without contrast done on 03/24/2023 showed acute/subacute burst fracture of L4 with greater than 50% height loss and 4 mm retropulsion. Patient was treated with tramadol, Colace.  Potassium was replenished Milus Banister (PA for neurosurgery team) was consulted and recommended TLSO brace and outpatient follow-up during or after rehab per ED PA. Hospitalist was asked to admit patient for further evaluation and management.  Assessment and Plan: 1)Lumbar burst fracture/s/p Fall at home -Patient believes that he fell due to accidental overdose of Norco which caused him to lose balance prior to the fall.  -EDP Discussed with neurosurgical PA Ms Meyren who recommends TLSO brace, and outpatient neurosurgical follow-up after completing rehab. -- Remains high risk for falls.   -Continue judicious analgesic therapy. 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 -There was also nonbleeding colonic NGO ectasis that were treated with argon plasma coagulation. -  Continue Carafate enema twice a day to accelerate healing process; Metamucil twice daily along with Colace and MiraLAX for bowel  regimen. -Patient will follow-up with GI as an outpatient.   3)GERD--- with H/o  Schatzki's ring s/p dilation,  -Consult appreciated. -continue Protonix. -Lifestyle discussion sustained with patient.   4)Hypothyroidism-- -TSH within normal limits -c/n Levothyroxine   5)H/o prostate cancer s/p XRT --- continue Flomax and Proscar -Patient denies complaint of urinary retention symptoms.   6)History of prior stroke -Continue to hold aspirin due to concerns for GI bleed, follow clearance by GI prior to resume aspirin for secondary prevention. -No focal deficits. -Continue risk factor modifications.   7)Social/Ethics--- -patient discharged to skilled nursing facility for further care and rehabilitation.   8) lung nodules -Appreciated on CT -Outpatient follow-up with repeat images in 1-5-month per radiology recommendations; based on findings at that time further discussed the need of lung biopsy. -2.4 cm spiculated nodule in the anterior left upper lobe highly suspicious for primary bronchogenic carcinoma. -pulmonary service outpatient follow-up has been recommended; patient's son expressed that they will arrange visit with desired pulmonologist after discharge.  Consultants: Neurosurgery, GI service. Procedures performed: See below for x-ray reports.  Sigmoidoscopy (04/02/2023). Disposition: Skilled nursing facility Diet recommendation: Heart healthy diet.  DISCHARGE MEDICATION: Allergies as of 04/02/2023       Reactions   Norvasc [amlodipine Besylate] Swelling   Ankle swelling   Codeine Nausea And Vomiting   Amoxicillin Rash        Medication List     STOP taking these medications    aspirin EC 81 MG tablet       TAKE these medications    acetaminophen 500 MG tablet Commonly known as: TYLENOL Take 2 tablets (1,000 mg total) by mouth every 8 (eight) hours as needed for mild pain (pain score 1-3).   acyclovir 800 MG tablet Commonly known as: ZOVIRAX Take 800 mg  by mouth See admin instructions. Take 800 mg daily, increase to 800 mg 3 times daily as needed when having a fever blister flare   ALPRAZolam 0.5 MG tablet Commonly known as: XANAX Take 1 tablet (0.5 mg total) by mouth at bedtime as needed for sleep.   cyanocobalamin 1000 MCG tablet Commonly known as: VITAMIN B12 Take 2,000 mcg by mouth daily.   docusate sodium 100 MG capsule Commonly known as: COLACE Take 1 capsule (100 mg total) by mouth 2 (two) times daily.   finasteride 5 MG tablet Commonly known as: PROSCAR Take 1 tablet (5 mg total) by mouth daily.   fluticasone 0.05 % cream Commonly known as: CUTIVATE SMARTSIG:Topical 1-2 Times Daily PRN   furosemide 40 MG tablet Commonly known as: LASIX Take 40 mg by mouth daily.   levothyroxine 25 MCG tablet Commonly known as: SYNTHROID Take 25 mcg by mouth daily.   meclizine 12.5 MG tablet Commonly known as: ANTIVERT Take 1 tablet (12.5 mg total) by mouth 3 (three) times daily as needed for dizziness. What changed:  when to take this reasons to take this   melatonin 3 MG Tabs tablet Take 3 mg by mouth at bedtime. For sleep   methocarbamol 500 MG tablet Commonly known as: ROBAXIN Take 1 tablet (500 mg total) by mouth every 8 (eight) hours as needed for muscle spasms.   oxyCODONE 5 MG immediate release tablet Commonly known as: Oxy IR/ROXICODONE Take 1 tablet (5 mg total) by mouth every 6 (six) hours as needed for severe pain (pain score 7-10).   pantoprazole  40 MG tablet Commonly known as: PROTONIX Take 1 tablet (40 mg total) by mouth daily before breakfast.   polyethylene glycol 17 g packet Commonly known as: MIRALAX / GLYCOLAX Take 17 g by mouth daily. Can be spread to every other day in the setting of diarrhea.   potassium chloride 10 MEQ tablet Commonly known as: KLOR-CON M Take 1 tablet (10 mEq total) by mouth daily.   psyllium 95 % Pack Commonly known as: HYDROCIL/METAMUCIL Take 1 packet by mouth 2 (two)  times daily.   silodosin 8 MG Caps capsule Commonly known as: RAPAFLO Take 1 capsule (8 mg total) by mouth 2 (two) times daily.   simvastatin 20 MG tablet Commonly known as: ZOCOR Take 20 mg by mouth every evening.   sucralfate 1 GM/10ML suspension Commonly known as: Carafate Provide 1 gram enema twice a day.   SYSTANE OP Place 1 drop into both eyes 2 (two) times daily as needed (dry eyes).   traMADol 50 MG tablet Commonly known as: Ultram Take 1 tablet (50 mg total) by mouth every 6 (six) hours as needed. What changed: You were already taking a medication with the same name, and this prescription was added. Make sure you understand how and when to take each.   traMADol 50 MG tablet Commonly known as: ULTRAM Take 1 tablet (50 mg total) by mouth every 8 (eight) hours as needed for moderate pain (pain score 4-6). What changed: when to take this   vitamin C 1000 MG tablet Take 1,000 mg by mouth daily.        Contact information for after-discharge care     Destination     [facility] .   Service: Skilled Nursing Contact information: [address] [place] [address] [phone]                    Discharge Exam: Ceasar Mons Weights   03/30/23 1643 04/02/23 1101  Weight: 71.7 kg 71.7 kg   General exam: Alert, awake, following commands and in no acute distress. Respiratory system: Clear to auscultation. Respiratory effort normal.  Good saturation on room air. Cardiovascular system:RRR. No rubs or gallops. Gastrointestinal system: Abdomen is nondistended, soft and with positive bowel sounds. Central nervous system: Demonstrating generalized weakness, lumbar spine discomfort on palpation/movement and having lower extremity weakness.   Extremities: No cyanosis or clubbing. Skin: No petechiae. Psychiatry: Mood & affect appropriate.   Condition at discharge: Stable and improved.  The results of significant diagnostics from  this hospitalization (including imaging, microbiology, ancillary and laboratory) are listed below for reference.   Imaging Studies: CT CHEST WO CONTRAST  Result Date: 03/31/2023 CLINICAL DATA:  Follow-up pulmonary nodule. Left lower quadrant abdominal pain. Lower GI bleed. EXAM: CT CHEST WITHOUT CONTRAST CT ABDOMEN AND PELVIS WITH CONTRAST TECHNIQUE: Multidetector CT imaging of the chest was performed following the standard protocol without IV contrast. Multidetector CT imaging of the abdomen and pelvis was performed following the standard protocol during bolus administration of intravenous contrast. RADIATION DOSE REDUCTION: This exam was performed according to the departmental dose-optimization program which includes automated exposure control, adjustment of the mA and/or kV according to patient size and/or use of iterative reconstruction technique. CONTRAST:  OMNIPAQUE IOHEXOL 300 MG/ML  SOLN COMPARISON:  Chest radiograph dated 03/05/2023. Lumbar spine CT dated 03/24/2023. CT abdomen/pelvis dated 06/17/2018. FINDINGS: CT CHEST FINDINGS Cardiovascular: The heart is normal in size. No pericardial effusion. No evidence of thoracic aneurysm. Atherosclerotic calcifications of the  aortic root/arch. Severe three-vessel coronary atherosclerosis. Mediastinum/Nodes: Small mediastinal nodes, including a dominant 10 mm short axis subcarinal node (series 1/image 72). Visualized thyroid is unremarkable. Lungs/Pleura: 2.4 x 1.5 x 2.2 cm spiculated nodule in the anterior left upper lobe (coronal image 63), highly suspicious for primary bronchogenic carcinoma. Mild right basilar opacity, likely atelectasis. Mild patchy left lower lobe opacity, favoring pneumonia over atelectasis. Moderate centrilobular and paraseptal emphysematous changes. 5 x 15 mm irregular platelike nodule in the posterior right upper lobe along the major fissure (series 4/image 34). Trace left pleural effusion.  No pneumothorax. Musculoskeletal:  Degenerative changes of the thoracic spine. Mild anterior wedging at T9 (sagittal image 86). CT ABDOMEN PELVIS FINDINGS Hepatobiliary: Liver is within normal limits. Status post cholecystectomy. No intrahepatic or extrahepatic duct dilatation. Pancreas: Within normal limits. Spleen: Within normal limits. Adrenals/Urinary Tract: Adrenal glands are within normal limits. 5.4 cm simple right lower pole renal cyst (series 4/image 43). Kidneys are otherwise within normal limits. No hydronephrosis. Bladder is notable for mild eccentric wall thickening on the left (series 4/image 74), although underdistended. Stomach/Bowel: Stomach is within normal limits. No evidence of bowel obstruction. Normal appendix (series 4/image 50). Mild rectal wall thickening (series 4/image 39), raising the possibility of infectious/inflammatory proctitis. Associated trace presacral fluid. Vascular/Lymphatic: No evidence of abdominal aortic aneurysm. Atherosclerotic calcifications of the abdominal aorta and branch vessels, although vessels remain patent. No suspicious abdominopelvic lymphadenopathy. Reproductive: Prostate is notable for dystrophic calcifications and fiducial markers. Other: No abdominopelvic ascites. Musculoskeletal: Moderate compression fracture deformity at L4 with mild retropulsion, unchanged from recent CT. Mild degenerative changes of the lumbar spine. IMPRESSION: 2.4 cm spiculated nodule in the anterior left upper lobe, highly suspicious for primary bronchogenic carcinoma. Contralateral 5 x 15 mm irregular nodule along the posterior right upper lobe, warranting attention on follow-up. Small mediastinal nodes, including a dominant 10 mm short axis subcarinal node, indeterminate. Mild patchy left lower lobe opacity, favoring pneumonia over atelectasis. Trace left pleural effusion. Mild rectal wall thickening, raising the possibility of infectious/inflammatory proctitis. Additional ancillary findings as above. Aortic  Atherosclerosis (ICD10-I70.0) and Emphysema (ICD10-J43.9). Electronically Signed   By: Charline Bills M.D.   On: 03/31/2023 20:05   CT ABDOMEN PELVIS W CONTRAST  Result Date: 03/31/2023 CLINICAL DATA:  Follow-up pulmonary nodule. Left lower quadrant abdominal pain. Lower GI bleed. EXAM: CT CHEST WITHOUT CONTRAST CT ABDOMEN AND PELVIS WITH CONTRAST TECHNIQUE: Multidetector CT imaging of the chest was performed following the standard protocol without IV contrast. Multidetector CT imaging of the abdomen and pelvis was performed following the standard protocol during bolus administration of intravenous contrast. RADIATION DOSE REDUCTION: This exam was performed according to the departmental dose-optimization program which includes automated exposure control, adjustment of the mA and/or kV according to patient size and/or use of iterative reconstruction technique. CONTRAST:  OMNIPAQUE IOHEXOL 300 MG/ML  SOLN COMPARISON:  Chest radiograph dated 03/05/2023. Lumbar spine CT dated 03/24/2023. CT abdomen/pelvis dated 06/17/2018. FINDINGS: CT CHEST FINDINGS Cardiovascular: The heart is normal in size. No pericardial effusion. No evidence of thoracic aneurysm. Atherosclerotic calcifications of the aortic root/arch. Severe three-vessel coronary atherosclerosis. Mediastinum/Nodes: Small mediastinal nodes, including a dominant 10 mm short axis subcarinal node (series 1/image 72). Visualized thyroid is unremarkable. Lungs/Pleura: 2.4 x 1.5 x 2.2 cm spiculated nodule in the anterior left upper lobe (coronal image 63), highly suspicious for primary bronchogenic carcinoma. Mild right basilar opacity, likely atelectasis. Mild patchy left lower lobe opacity, favoring pneumonia over atelectasis. Moderate centrilobular and paraseptal emphysematous changes. 5  x 15 mm irregular platelike nodule in the posterior right upper lobe along the major fissure (series 4/image 34). Trace left pleural effusion.  No pneumothorax.  Musculoskeletal: Degenerative changes of the thoracic spine. Mild anterior wedging at T9 (sagittal image 86). CT ABDOMEN PELVIS FINDINGS Hepatobiliary: Liver is within normal limits. Status post cholecystectomy. No intrahepatic or extrahepatic duct dilatation. Pancreas: Within normal limits. Spleen: Within normal limits. Adrenals/Urinary Tract: Adrenal glands are within normal limits. 5.4 cm simple right lower pole renal cyst (series 4/image 43). Kidneys are otherwise within normal limits. No hydronephrosis. Bladder is notable for mild eccentric wall thickening on the left (series 4/image 74), although underdistended. Stomach/Bowel: Stomach is within normal limits. No evidence of bowel obstruction. Normal appendix (series 4/image 50). Mild rectal wall thickening (series 4/image 39), raising the possibility of infectious/inflammatory proctitis. Associated trace presacral fluid. Vascular/Lymphatic: No evidence of abdominal aortic aneurysm. Atherosclerotic calcifications of the abdominal aorta and branch vessels, although vessels remain patent. No suspicious abdominopelvic lymphadenopathy. Reproductive: Prostate is notable for dystrophic calcifications and fiducial markers. Other: No abdominopelvic ascites. Musculoskeletal: Moderate compression fracture deformity at L4 with mild retropulsion, unchanged from recent CT. Mild degenerative changes of the lumbar spine. IMPRESSION: 2.4 cm spiculated nodule in the anterior left upper lobe, highly suspicious for primary bronchogenic carcinoma. Contralateral 5 x 15 mm irregular nodule along the posterior right upper lobe, warranting attention on follow-up. Small mediastinal nodes, including a dominant 10 mm short axis subcarinal node, indeterminate. Mild patchy left lower lobe opacity, favoring pneumonia over atelectasis. Trace left pleural effusion. Mild rectal wall thickening, raising the possibility of infectious/inflammatory proctitis. Additional ancillary findings as  above. Aortic Atherosclerosis (ICD10-I70.0) and Emphysema (ICD10-J43.9). Electronically Signed   By: Charline Bills M.D.   On: 03/31/2023 20:05   CT HEAD WO CONTRAST ( )  Result Date: 03/31/2023 CLINICAL DATA:  Metastatic disease evaluation. EXAM: CT HEAD WITHOUT CONTRAST TECHNIQUE: Contiguous axial images were obtained from the base of the skull through the vertex without intravenous contrast. RADIATION DOSE REDUCTION: This exam was performed according to the departmental dose-optimization program which includes automated exposure control, adjustment of the mA and/or kV according to patient size and/or use of iterative reconstruction technique. COMPARISON:  None Available. FINDINGS: Brain: No evidence of acute infarction, hemorrhage, hydrocephalus, extra-axial collection or mass lesion/mass effect. Generalized brain atrophy. Mild chronic small vessel ischemia in the cerebral white matter. Small chronic left cerebellar infarct. Vascular: No hyperdense vessel or unexpected calcification. Skull: Normal. Negative for fracture or focal lesion. Sinuses/Orbits: No acute finding. IMPRESSION: Aging brain without acute superimposed finding. Electronically Signed   By: Tiburcio Pea M.D.   On: 03/31/2023 20:01   CT Lumbar Spine Wo Contrast  Result Date: 03/24/2023 CLINICAL DATA:  Fall EXAM: CT LUMBAR SPINE WITHOUT CONTRAST TECHNIQUE: Multidetector CT imaging of the lumbar spine was performed without intravenous contrast administration. Multiplanar CT image reconstructions were also generated. RADIATION DOSE REDUCTION: This exam was performed according to the departmental dose-optimization program which includes automated exposure control, adjustment of the mA and/or kV according to patient size and/or use of iterative reconstruction technique. COMPARISON:  08/21/2021 lumbar spine MRI FINDINGS: Segmentation: 5 lumbar type vertebrae. Alignment: Normal. Vertebrae: Acute/subacute burst fracture of L4 with greater  than 50% height loss. Retropulsion 4 mm. Paraspinal and other soft tissues: Calcific aortic atherosclerosis. Disc levels: No bony spinal canal stenosis. IMPRESSION: 1. Acute/subacute burst fracture of L4 with greater than 50% height loss and 4 mm retropulsion. Aortic Atherosclerosis (ICD10-I70.0). Electronically Signed   By: Caryn Bee  Chase Picket M.D.   On: 03/24/2023 21:01   DG Lumbar Spine Complete  Result Date: 03/24/2023 CLINICAL DATA:  Fall with back pain. EXAM: LUMBAR SPINE - COMPLETE 4+ VIEW COMPARISON:  Lumbar radiograph 07/02/2021, MRI 08/21/2021 FINDINGS: The bones are subjectively under mineralized. Moderate L4 compression deformity with at least 30% loss of height of the superior endplate. This is age indeterminate but new from prior exam. No additional fracture or compression deformity. The alignment is normal. Anterior spurring at multiple levels. Side L3-L4 disc space narrowing. Lower lumbar facet hypertrophy. Sacroiliac joints are congruent. IMPRESSION: 1. Moderate L4 compression deformity, age indeterminate but new from May 2023. 2. Mild degenerative disc disease and facet hypertrophy. Electronically Signed   By: Narda Rutherford M.D.   On: 03/24/2023 17:16   DG Chest 2 View  Result Date: 03/24/2023 CLINICAL DATA:  generalized hyperhidrosis Shortness of breath for several months.  Cough. EXAM: CHEST - 2 VIEW COMPARISON:  02/23/2018 FINDINGS: Questionable 19 mm nodular opacity in the left upper lung zone, not seen on prior exam. Alternatively this may be related to summation shadows. Minor bibasilar atelectasis. The heart is normal in size. Stable mediastinal contours, aortic atherosclerosis. No pulmonary edema, large pleural effusion or pneumothorax. Mild thoracic spondylosis. No acute osseous findings. IMPRESSION: 1. Questionable 19 mm nodular opacity in the left upper lung zone, not seen on prior exam. Alternatively this may be related to summation shadows. Recommend further evaluation with  chest CT. 2. Minor bibasilar atelectasis. 3.  Aortic Atherosclerosis (ICD10-I70.0). Electronically Signed   By: Narda Rutherford M.D.   On: 03/24/2023 17:13   DG Pelvis 1-2 Views  Result Date: 03/24/2023 CLINICAL DATA:  Status post fall with pain. EXAM: PELVIS - 1-2 VIEW COMPARISON:  Pelvis and left hip radiograph 11/06/2022 FINDINGS: The bones are subjectively under mineralized. The cortical margins of the bony pelvis are intact. No fracture. Pubic symphysis and sacroiliac joints are congruent. Both femoral heads are well-seated in the respective acetabula. Mild bilateral hip osteoarthritis, without significant interval change. IMPRESSION: 1. No pelvic fracture. 2. Mild bilateral hip osteoarthritis. Electronically Signed   By: Narda Rutherford M.D.   On: 03/24/2023 17:11   ECHOCARDIOGRAM COMPLETE  Result Date: 03/11/2023    ECHOCARDIOGRAM REPORT   Patient Name:   Calvin Moreno Date of Exam: 03/11/2023 Medical Rec #:  [id number]        Height:       66.0 in Accession #:    [id number]       Weight:       156.0 lb Date of Birth:  10/18/1940        BSA:          1.799 m Patient Age:    81 years         BP:           112/73 mmHg Patient Gender: M                HR:           90 bpm. [facility] Procedure: 2D Echo, Cardiac Doppler and Color Doppler Indications:    Aortic Stenosis  History:        Patient has prior history of Echocardiogram examinations, most                 recent 12/26/2021. Stroke, Aortic Valve Disease, Arrythmias:LBBB,                 Signs/Symptoms:Murmur; Risk Factors:Hypertension, Dyslipidemia  and Former Smoker.  Sonographer:    Celesta Gentile RCS Referring Phys: [id number] Dorothe Pea BRANCH IMPRESSIONS  1. Left ventricular ejection fraction, by estimation, is 60 to 65%. The left ventricle has normal function. The left ventricle has no regional wall motion abnormalities. There is moderate left ventricular hypertrophy. Left ventricular diastolic parameters are  consistent with Grade I diastolic dysfunction (impaired relaxation). Elevated left atrial pressure.  2. Right ventricular systolic function is normal. The right ventricular size is normal. Tricuspid regurgitation signal is inadequate for assessing PA pressure.  3. The mitral valve is abnormal. Mild mitral valve regurgitation. No evidence of mitral stenosis. The mean mitral valve gradient is 3.0 mmHg.HR 65 bpm. Moderate mitral annular calcification.  4. The aortic valve was not well visualized. There is severe calcifcation of the aortic valve. There is severe thickening of the aortic valve. Aortic valve regurgitation is mild. Severe aortic valve stenosis. Aortic valve mean gradient measures 39.0 mmHg. Aortic valve peak gradient measures 50.1 mmHg. Aortic valve area, by VTI measures 0.93 cm.  5. The inferior vena cava is normal in size with greater than 50% respiratory variability, suggesting right atrial pressure of 3 mmHg. FINDINGS  Left Ventricle: Left ventricular ejection fraction, by estimation, is 60 to 65%. The left ventricle has normal function. The left ventricle has no regional wall motion abnormalities. The left ventricular internal cavity size was normal in size. There is  moderate left ventricular hypertrophy. Left ventricular diastolic parameters are consistent with Grade I diastolic dysfunction (impaired relaxation). Elevated left atrial pressure. Right Ventricle: The right ventricular size is normal. Right vetricular wall thickness was not well visualized. Right ventricular systolic function is normal. Tricuspid regurgitation signal is inadequate for assessing PA pressure. Left Atrium: Left atrial size was normal in size. Right Atrium: Right atrial size was normal in size. Pericardium: There is no evidence of pericardial effusion. Mitral Valve: The mitral valve is abnormal. There is moderate thickening of the mitral valve leaflet(s). There is moderate calcification of the mitral valve leaflet(s).  Moderate mitral annular calcification. Mild mitral valve regurgitation. No evidence of mitral valve stenosis. MV peak gradient, 11.6 mmHg. The mean mitral valve gradient is 3.0 mmHg. Tricuspid Valve: The tricuspid valve is normal in structure. Tricuspid valve regurgitation is not demonstrated. No evidence of tricuspid stenosis. Aortic Valve: The aortic valve was not well visualized. There is severe calcifcation of the aortic valve. There is severe thickening of the aortic valve. There is severe aortic valve annular calcification. Aortic valve regurgitation is mild. Aortic regurgitation PHT measures 460 msec. Severe aortic stenosis is present. Aortic valve mean gradient measures 39.0 mmHg. Aortic valve peak gradient measures 50.1 mmHg. Aortic valve area, by VTI measures 0.93 cm. Pulmonic Valve: The pulmonic valve was not well visualized. Pulmonic valve regurgitation is not visualized. No evidence of pulmonic stenosis. Aorta: The aortic root is normal in size and structure. Venous: The inferior vena cava is normal in size with greater than 50% respiratory variability, suggesting right atrial pressure of 3 mmHg. IAS/Shunts: No atrial level shunt detected by color flow Doppler.  LEFT VENTRICLE PLAX 2D LVIDd:         3.40 cm   Diastology LVIDs:         2.00 cm   LV e' medial:    3.86 cm/s LV PW:         1.40 cm   LV E/e' medial:  20.3 LV IVS:        1.30 cm   LV e'  lateral:   5.55 cm/s LVOT diam:     2.00 cm   LV E/e' lateral: 14.1 LV SV:         73 LV SV Index:   41 LVOT Area:     3.14 cm  RIGHT VENTRICLE RV S prime:     8.16 cm/s TAPSE (M-mode): 2.0 cm LEFT ATRIUM             Index        RIGHT ATRIUM           Index LA diam:        3.40 cm 1.89 cm/m   RA Area:     14.50 cm LA Vol (A2C):   76.1 ml 42.29 ml/m  RA Volume:   30.40 ml  16.89 ml/m LA Vol (A4C):   45.6 ml 25.34 ml/m LA Biplane Vol: 58.4 ml 32.46 ml/m  AORTIC VALVE AV Area (Vmax):    0.90 cm AV Area (Vmean):   0.92 cm AV Area (VTI):     0.93 cm AV  Vmax:           354.00 cm/s AV Vmean:          259.750 cm/s AV VTI:            0.786 m AV Peak Grad:      50.1 mmHg AV Mean Grad:      39.0 mmHg LVOT Vmax:         101.00 cm/s LVOT Vmean:        76.100 cm/s LVOT VTI:          0.232 m LVOT/AV VTI ratio: 0.30 AI PHT:            460 msec  AORTA Ao Root diam: 3.70 cm MITRAL VALVE MV Area (PHT): 2.20 cm     SHUNTS MV Peak grad:  11.6 mmHg    Systemic VTI:  0.23 m MV Mean grad:  3.0 mmHg     Systemic Diam: 2.00 cm MV Vmax:       1.70 m/s MV Vmean:      63.7 cm/s MV Decel Time: 345 msec MV E velocity: 78.50 cm/s MV A velocity: 170.00 cm/s MV E/A ratio:  0.46 Dina Rich MD Electronically signed by Dina Rich MD Signature Date/Time: 03/11/2023/5:03:24 PM    Final     Microbiology: Results for orders placed or performed during the hospital encounter of 06/08/20  SARS CORONAVIRUS 2 (TAT 6-24 HRS) Nasopharyngeal Nasopharyngeal Swab     Status: None   Collection Time: 06/08/20  1:11 PM   Specimen: Nasopharyngeal Swab  Result Value Ref Range Status   SARS Coronavirus 2 NEGATIVE NEGATIVE Final    Comment: (NOTE) SARS-CoV-2 target nucleic acids are NOT DETECTED.  The SARS-CoV-2 RNA is generally detectable in upper and lower respiratory specimens during the acute phase of infection. Negative results do not preclude SARS-CoV-2 infection, do not rule out co-infections with other pathogens, and should not be used as the sole basis for treatment or other patient management decisions. Negative results must be combined with clinical observations, patient history, and epidemiological information. The expected result is Negative.  Fact Sheet for Patients: HairSlick.no  Fact Sheet for Healthcare Providers: quierodirigir.com  This test is not yet approved or cleared by the [place] FDA and  has been authorized for detection and/or diagnosis of SARS-CoV-2 by FDA under an Emergency Use  Authorization (EUA). This EUA will remain  in effect (meaning this test can be  used) for the duration of the COVID-19 declaration under Se ction 564(b)(1) of the Act, 21 U.S.C. section 360bbb-3(b)(1), unless the authorization is terminated or revoked sooner.  Performed at [facility], [address], [place], [place] [address]     Labs: CBC: Recent Labs  Lab 03/30/23 1817 03/31/23 0423 03/31/23 1323 04/01/23 0419  WBC 14.8* 12.4*  --  9.9  NEUTROABS 12.8*  --   --   --   HGB 13.5 11.7* 12.3* 10.2*  HCT 40.1 35.2* 37.7* 30.6*  MCV 89.1 89.6  --  91.1  PLT 369 332  --  305   Basic Metabolic Panel: Recent Labs  Lab 03/30/23 1817 03/31/23 0423 04/01/23 0505  NA 138 135 136  K 3.0* 2.9* 4.1  CL 101 104 105  CO2 25 24 26   GLUCOSE 131* 129* 95  BUN 18 18 12   CREATININE 0.93 1.03 0.88  CALCIUM 8.3* 7.9* 8.1*  MG  --  2.1  --   PHOS  --  3.1  --    Liver Function Tests: Recent Labs  Lab 03/30/23 1817 03/31/23 0423 04/01/23 0505  AST 24 19 16   ALT 25 22 19   ALKPHOS 132* 113 107  BILITOT 0.9 0.8 0.9  PROT 5.9* 5.2* 5.1*  ALBUMIN 2.6* 2.3* 2.3*   CBG: No results for input(s): "GLUCAP" in the last 168 hours.  Discharge time spent: greater than 30 minutes.  Signed: Vassie Loll, MD [facility] 04/02/2023

## 2023-04-02 NOTE — Progress Notes (Signed)
Patient has been NPO since midnight. 2 tap water enemas given, patient tolerated well. Patient rested well throughout the night. Stool is light brown liquid with no solid stool present.

## 2023-04-02 NOTE — Op Note (Signed)
[facility] Patient Name: Calvin Moreno Procedure Date: 04/02/2023 10:58 AM MRN: [id number] Date of Birth: 1941/03/27 Attending MD: Sanjuan Dame , MD, [id number] CSN: [id number] Age: 82 Admit Type: Outpatient Procedure:                Flexible Sigmoidoscopy Indications:              Hematochezia, Abnormal CT of the GI tract Providers:                Sanjuan Dame, MD, Buel Ream. Thomasena Edis RN, RN,                            Zena Amos Referring MD:              Medicines:                None, Monitored Anesthesia Care Complications:            No immediate complications. Estimated Blood Loss:     Estimated blood loss: none. Estimated blood loss                            was minimal. Procedure:                Pre-Anesthesia Assessment:                           - Prior to the procedure, a History and Physical                            was performed, and patient medications and                            allergies were reviewed. The patient's tolerance of                            previous anesthesia was also reviewed. The risks                            and benefits of the procedure and the sedation                            options and risks were discussed with the patient.                            All questions were answered, and informed consent                            was obtained. Prior Anticoagulants: The patient has                            taken no anticoagulant or antiplatelet agents. ASA                            Grade Assessment: III - A patient with severe  systemic disease. After reviewing the risks and                            benefits, the patient was deemed in satisfactory                            condition to undergo the procedure.                           After obtaining informed consent, the scope was                            passed under direct vision. The [id number]-[id number]                            ([id number]) scope  was introduced through the anus and                            advanced to the the left transverse colon. The                            flexible sigmoidoscopy was accomplished without                            difficulty. The patient tolerated the procedure                            well. The quality of the bowel preparation was fair. Scope In: 11:15:37 AM Scope Out: 11:25:02 AM Total Procedure Duration: 0 hours 9 minutes 25 seconds  Findings:      The digital rectal exam findings include induration and firmness.      A continuous area of nonbleeding ulcerated mucosa with no stigmata of       recent bleeding was present in the rectum. Biopsies were taken with a       cold forceps for histology.      A few angioectasias without bleeding were found in the rectum.       Coagulation for bleeding prevention using argon plasma at 0.8       liters/minute and 20 watts was successful. Impression:               - Preparation of the colon was fair.                           - Induration and firmness found on digital rectal                            exam.                           -Large Mucosal ulceration in the rectum. Biopsied.                           - A few non-bleeding colonic angioectasias. Treated  with argon plasma coagulation (APC). Moderate Sedation:      Per Anesthesia Care Recommendation:           -High fiber diet , Metamucil BID                           Restart diet                           Sucralfate enemas to augment in healing of rectal                            ulcer                           Follow up biopsy results                           Consider colorectal suurgey evaluation if biopsies                            non-diagnostic                           May need repeat Flex sig in 1-2 months to evaluate                            for healing of the ulcer Procedure Code(s):        --- Professional ---                           [phone],  59, Sigmoidoscopy, flexible; with control of                            bleeding, any method                           45331, Sigmoidoscopy, flexible; with biopsy, single                            or multiple Diagnosis Code(s):        --- Professional ---                           K62.6, Ulcer of anus and rectum                           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 All rights reserved. The codes documented  in this report are preliminary and upon coder review may  be revised to meet current compliance requirements. Sanjuan Dame, MD Sanjuan Dame, MD 04/02/2023 11:31:42 AM This report has been signed electronically. Number of Addenda: 0

## 2023-04-02 NOTE — Anesthesia Procedure Notes (Signed)
Date/Time: 04/02/2023 11:10 AM  Performed by: Julian Reil, CRNAPre-anesthesia Checklist: Patient identified, Suction available, Patient being monitored and Emergency Drugs available Patient Re-evaluated:Patient Re-evaluated prior to induction Oxygen Delivery Method: Nasal cannula Induction Type: IV induction Placement Confirmation: positive ETCO2

## 2023-04-02 NOTE — Interval H&P Note (Addendum)
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  The various methods of treatment have been discussed with the patient and family. After consideration of risks, benefits and other options for treatment, the patient has consented to  Procedure(s): FLEXIBLE SIGMOIDOSCOPY (N/A) as a surgical intervention.  The patient's history has been reviewed, patient examined, no change in status, stable for surgery.  I have reviewed the patient's chart and labs.  Questions were answered to the patient's satisfaction.    We will proceed with Flexible sigmoidoscopy as scheduled.    I thoroughly discussed with the patient the procedure, including the risks involved. Patient understands what the procedure involves including the benefits and any risks. Patient understands alternatives to the proposed procedure. Risks including (but not limited to) bleeding, tearing of the lining (perforation), rupture of adjacent organs, problems with heart and lung function, infection, and medication reactions. A small percentage of complications may require surgery, hospitalization, repeat endoscopic procedure, and/or transfusion.  Patient understood and agreed.   Juanetta Beets Katina Remick

## 2023-04-02 NOTE — Anesthesia Preprocedure Evaluation (Signed)
Anesthesia Evaluation  Patient identified by MRN, date of birth, ID band Patient awake    Reviewed: Allergy & Precautions, H&P , NPO status , Patient's Chart, lab work & pertinent test results, reviewed documented beta blocker date and time   Airway Mallampati: II  TM Distance: >3 FB Neck ROM: full    Dental no notable dental hx.    Pulmonary neg pulmonary ROS, former smoker   Pulmonary exam normal breath sounds clear to auscultation       Cardiovascular Exercise Tolerance: Good hypertension, negative cardio ROS + dysrhythmias + Valvular Problems/Murmurs  Rhythm:regular Rate:Normal     Neuro/Psych  Neuromuscular disease CVA negative neurological ROS  negative psych ROS   GI/Hepatic negative GI ROS, Neg liver ROS, hiatal hernia,GERD  ,,  Endo/Other  negative endocrine ROSHypothyroidism    Renal/GU CRFRenal diseasenegative Renal ROS  negative genitourinary   Musculoskeletal   Abdominal   Peds  Hematology negative hematology ROS (+)   Anesthesia Other Findings   Reproductive/Obstetrics negative OB ROS                             Anesthesia Physical Anesthesia Plan  ASA: 4 and emergent  Anesthesia Plan: General   Post-op Pain Management:    Induction:   PONV Risk Score and Plan: Propofol infusion  Airway Management Planned:   Additional Equipment:   Intra-op Plan:   Post-operative Plan:   Informed Consent: I have reviewed the patients History and Physical, chart, labs and discussed the procedure including the risks, benefits and alternatives for the proposed anesthesia with the patient or authorized representative who has indicated his/her understanding and acceptance.     Dental Advisory Given  Plan Discussed with: CRNA  Anesthesia Plan Comments:        Anesthesia Quick Evaluation

## 2023-04-02 NOTE — Transfer of Care (Signed)
Immediate Anesthesia Transfer of Care Note  Patient: Calvin Moreno  Procedure(s) Performed: FLEXIBLE SIGMOIDOSCOPY  Patient Location: PACU  Anesthesia Type:General  Level of Consciousness: awake  Airway & Oxygen Therapy: Patient Spontanous Breathing and Patient connected to nasal cannula oxygen  Post-op Assessment: Report given to RN and Post -op Vital signs reviewed and stable  Post vital signs: Reviewed and stable  Last Vitals:  Vitals Value Taken Time  BP 102/48 04/02/23 1133  Temp    Pulse 56 04/02/23 1134  Resp 17 04/02/23 1134  SpO2 98 % 04/02/23 1134  Vitals shown include unfiled device data.  Last Pain:  Vitals:   04/02/23 1114  TempSrc:   PainSc: 8       Patients Stated Pain Goal: 0 (04/02/23 0556)  Complications: No notable events documented.

## 2023-04-02 NOTE — Plan of Care (Signed)
  Problem: Education: Goal: Knowledge of General Education information will improve Description Including pain rating scale, medication(s)/side effects and non-pharmacologic comfort measures Outcome: Progressing   Problem: Health Behavior/Discharge Planning: Goal: Ability to manage health-related needs will improve Outcome: Progressing   

## 2023-04-02 NOTE — Plan of Care (Signed)
  Problem: Education: Goal: Knowledge of General Education information will improve Description: Including pain rating scale, medication(s)/side effects and non-pharmacologic comfort measures Outcome: Adequate for Discharge   Problem: Health Behavior/Discharge Planning: Goal: Ability to manage health-related needs will improve Outcome: Adequate for Discharge   Problem: Clinical Measurements: Goal: Ability to maintain clinical measurements within normal limits will improve Outcome: Adequate for Discharge Goal: Will remain free from infection Outcome: Adequate for Discharge Goal: Diagnostic test results will improve Outcome: Adequate for Discharge Goal: Respiratory complications will improve Outcome: Adequate for Discharge Goal: Cardiovascular complication will be avoided Outcome: Adequate for Discharge   Problem: Activity: Goal: Risk for activity intolerance will decrease Outcome: Adequate for Discharge   Problem: Nutrition: Goal: Adequate nutrition will be maintained Outcome: Adequate for Discharge   Problem: Coping: Goal: Level of anxiety will decrease Outcome: Adequate for Discharge   Problem: Elimination: Goal: Will not experience complications related to bowel motility Outcome: Adequate for Discharge Goal: Will not experience complications related to urinary retention Outcome: Adequate for Discharge   Problem: Pain Management: Goal: General experience of comfort will improve Outcome: Adequate for Discharge   Problem: Safety: Goal: Ability to remain free from injury will improve Outcome: Adequate for Discharge   Problem: Skin Integrity: Goal: Risk for impaired skin integrity will decrease Outcome: Adequate for Discharge   Problem: Acute Rehab OT Goals (only OT should resolve) Goal: Pt. Will Perform Grooming Outcome: Adequate for Discharge Goal: Pt. Will Perform Upper Body Dressing Outcome: Adequate for Discharge Goal: Pt. Will Perform Lower Body  Dressing Outcome: Adequate for Discharge Goal: Pt. Will Transfer To Toilet Outcome: Adequate for Discharge Goal: Pt. Will Perform Toileting-Clothing Manipulation Outcome: Adequate for Discharge Goal: Pt/Caregiver Will Perform Home Exercise Program Outcome: Adequate for Discharge   Problem: Acute Rehab PT Goals(only PT should resolve) Goal: Pt Will Go Supine/Side To Sit Outcome: Adequate for Discharge Goal: Patient Will Transfer Sit To/From Stand Outcome: Adequate for Discharge Goal: Pt Will Transfer Bed To Chair/Chair To Bed Outcome: Adequate for Discharge Goal: Pt Will Ambulate Outcome: Adequate for Discharge

## 2023-04-02 NOTE — Progress Notes (Signed)
Physical Therapy Treatment Patient Details Name: Calvin Moreno MRN: [id number] DOB: February 15, 1941 Today's Date: 04/02/2023   History of Present Illness Calvin Moreno is a 82 y.o. male with medical history significant of hypertension, hyperlipidemia, hypothyroidism, GERD, hearing impairment and history of stroke who presents to the emergency department after being asked to go to the ED for further evaluation and management by PCP due to CT evidence of L4 burst fracture.     He was seen in the ED on 10/29 after sustaining a fall at home, but he left AMA prior to completion of workup.  CT of the lumbar spine done at that time showed a burst fracture of L4.  He presented to PCP today and patient was sent to the ED for further pain control and possible discharge to a rehab.  Patient believes that he fell due to accidental overdose of Norco which caused him to lose balance prior to the fall.  Patient complained of back pain and family complained of progressive decline and loss of appetite per ED medical record.  He endorsed constipation, but denies chest pain, shortness of breath, nausea, vomiting, headache, fever, chills.    PT Comments  Pt supine and willing to participate with therapy.  Slow labored movements with bed mobility and transfer.  Required use of handrails and elevated HOB to assist with sitting on EOB.  Brace placed on back prior standing.  Cueing for handplacement to assist with standing.  Slow labored movements with gait, limited by fatigue, pain and weakness. EOS pt left in chair with call bell within reach and family present in room.   If plan is discharge home, recommend the following:     Can travel by private vehicle        Equipment Recommendations       Recommendations for Other Services       Precautions / Restrictions Precautions Precautions: Fall Required Braces or Orthoses: Spinal Brace Spinal Brace: Thoracolumbosacral orthotic     Mobility  Bed Mobility Overal  bed mobility: Needs Assistance Bed Mobility: Rolling, Sidelying to Sit Rolling: Min assist, Contact guard assist, Used rails Sidelying to sit: Min assist, HOB elevated, Used rails       General bed mobility comments: required HOB partially raised and use of bed rails    Transfers Overall transfer level: Needs assistance Equipment used: Rolling walker (2 wheels) Transfers: Sit to/from Stand Sit to Stand: Min assist           General transfer comment: Cueing for handplacment for safety with RW, labored, slow movements    Ambulation/Gait Ambulation/Gait assistance: Editor, commissioning (Feet): 40 Feet Assistive device: Rolling walker (2 wheels) Gait Pattern/deviations: Decreased step length - right, Decreased step length - left, Decreased stride length, Trunk flexed Gait velocity: decreased     General Gait Details: slow labored cadence without loss of balance, limited mostly due to c/o fatigue, increasing back pain and generalized weakness   Stairs             Wheelchair Mobility     Tilt Bed    Modified Rankin (Stroke Patients Only)       Balance                                            Cognition Arousal: Alert Behavior During Therapy: WFL for tasks assessed/performed Overall Cognitive Status: Within  Functional Limits for tasks assessed                                 General Comments: Pt frustated that he is NPO and thought he would have procedure yesterday that didn't happen.  Appropriate behavior through session.        Exercises      General Comments        Pertinent Vitals/Pain Pain Assessment Pain Score: 7  Pain Location: low back Pain Descriptors / Indicators: Grimacing, Guarding, Moaning Pain Intervention(s): Repositioned, Monitored during session, Limited activity within patient's tolerance    Home Living                          Prior Function            PT Goals (current  goals can now be found in the care plan section)      Frequency           PT Plan      Co-evaluation              AM-PAC PT "6 Clicks" Mobility   Outcome Measure  Help needed turning from your back to your side while in a flat bed without using bedrails?: A Little Help needed moving from lying on your back to sitting on the side of a flat bed without using bedrails?: A Little Help needed moving to and from a bed to a chair (including a wheelchair)?: A Little Help needed standing up from a chair using your arms (e.g., wheelchair or bedside chair)?: A Little Help needed to walk in hospital room?: A Little Help needed climbing 3-5 steps with a railing? : A Lot 6 Click Score: 17    End of Session Equipment Utilized During Treatment: Gait belt;Back brace Activity Tolerance: Patient tolerated treatment well;Patient limited by fatigue Patient left: in chair;with call bell/phone within reach Nurse Communication: Mobility status       Time: 0940-1003 PT Time Calculation (min) (ACUTE ONLY): 23 min  Charges:    $Therapeutic Activity: 23-37 mins                       Becky Sax, LPTA/CLT; CBIS [phone]  Juel Burrow 04/02/2023, 1:17 PM

## 2023-04-02 NOTE — Care Management Important Message (Signed)
Important Message  Patient Details  Name: Calvin Moreno MRN: [id number] Date of Birth: 25-Mar-1941   Important Message Given:  Yes - Medicare IM     Corey Harold 04/02/2023, 11:49 AM

## 2023-04-03 ENCOUNTER — Encounter: Payer: Self-pay | Admitting: Adult Health

## 2023-04-03 ENCOUNTER — Non-Acute Institutional Stay (SKILLED_NURSING_FACILITY): Payer: Medicare Other | Admitting: Adult Health

## 2023-04-03 DIAGNOSIS — Z8546 Personal history of malignant neoplasm of prostate: Secondary | ICD-10-CM | POA: Diagnosis not present

## 2023-04-03 DIAGNOSIS — R918 Other nonspecific abnormal finding of lung field: Secondary | ICD-10-CM

## 2023-04-03 DIAGNOSIS — K922 Gastrointestinal hemorrhage, unspecified: Secondary | ICD-10-CM | POA: Diagnosis not present

## 2023-04-03 DIAGNOSIS — S32001S Stable burst fracture of unspecified lumbar vertebra, sequela: Secondary | ICD-10-CM

## 2023-04-03 DIAGNOSIS — Z8673 Personal history of transient ischemic attack (TIA), and cerebral infarction without residual deficits: Secondary | ICD-10-CM

## 2023-04-03 DIAGNOSIS — K219 Gastro-esophageal reflux disease without esophagitis: Secondary | ICD-10-CM

## 2023-04-03 LAB — SURGICAL PATHOLOGY

## 2023-04-03 NOTE — Progress Notes (Signed)
Location:  Other [facility]) Nursing Home Room Number: [place] [address]-A Place of Service:  SNF [phone]) Provider:  Kenard Gower, DNP, FNP-BC  Patient Care Team: Benita Stabile, MD as PCP - General (Internal Medicine) Wyline Mood Dorothe Pea, MD as PCP - Cardiology (Cardiology) Jena Gauss Gerrit Friends, MD (Gastroenterology)  Extended Emergency Contact Information Primary Emergency Contact: [facility] Address: [address]          [place], [place] [address] D[place] of [place] Home Phone: [phone] Relation: Spouse Secondary Emergency Contact: Malakiah, Mathiesen Mobile Phone: [phone] Relation: Son  Code Status:  Full Code  Goals of care: Advanced Directive information    04/03/2023   10:11 AM  Advanced Directives  Does Patient Have a Medical Advance Directive? No  Does patient want to make changes to medical advance directive? No - Patient declined  Would patient like information on creating a medical advance directive? No - Patient declined     Chief Complaint  Patient presents with   Acute Visit     follow up hospitalization    HPI:  Pt is a 82 y.o. male seen today to follow up hospitalization 03/30/23 to 04/02/23. He has a PMH of hypertension, hyperlipidemia,-27, GERD, hearing and history of stroke.  He was seen in the ED on 03/24/2023 post fall at home but left AMA prior to completing workup.  He presented to his PCP on 11/0/24 and was sent to the ED for pain management and possible discharge to rehab.    Patient believes that he fell due to accidental overdose of Norco which resulted in him losing balance and eventual fall. Work up in the ED showed elevated WBC of 14.8, K 3.0, FOBT positive. CT lumbar spine without contrast done on 03/24/23 showed acute/subacute burst fracture of L4 with greater than 50% height loss and 4 mm retropulsion. He was treated with Tramadol and Colace. Potassium was replenished. Neurosurgery recommended TLSO brace  He was seen in the room  today. He rated his lower back pain as 7/10.  Past Surgical History:  Procedure Laterality Date   ANKLE FRACTURE SURGERY     left   BIOPSY  12/31/2022   Procedure: BIOPSY;  Surgeon: Corbin Ade, MD;  [facility]: [facility];  Service: Endoscopy;;   CATARACT EXTRACTION Left    CATARACT EXTRACTION W/PHACO Right 12/19/2013   Procedure: CATARACT EXTRACTION PHACO AND INTRAOCULAR LENS PLACEMENT RIGHT EYE CDE=14.78;  Surgeon: Gemma Payor, MD;  Location: [facility];  Service: Ophthalmology;  Laterality: Right;   CHOLECYSTECTOMY     COLONOSCOPY  2006   Dr. Henry Russel mucosa throughout colon, moderate internal hemorrhoids. bx= benign colonic mucosa   COLONOSCOPY  12/03/2011   Dr. Jena Gauss- colonic diverticulosis, radiation-induced proctitis- s/p APC ablation, no microscopic colitis on bx   COLONOSCOPY WITH PROPOFOL N/A 06/12/2020   Procedure: COLONOSCOPY WITH PROPOFOL;  Surgeon: Corbin Ade, MD;  Location: [facility];  Service: Endoscopy;  Laterality: N/A;  2:45pm   ESOPHAGOGASTRODUODENOSCOPY  2006   Dr. Randa Evens- small hiatal hernia and slightly reddened distal esophagus o/w normal   ESOPHAGOGASTRODUODENOSCOPY (EGD) WITH PROPOFOL N/A 12/31/2022   Procedure: ESOPHAGOGASTRODUODENOSCOPY (EGD) WITH PROPOFOL;  Surgeon: Corbin Ade, MD;  Location: [facility];  Service: Endoscopy;  Laterality: N/A;  1245pm, asa 3   FLEXIBLE SIGMOIDOSCOPY N/A 07/13/2013   EPP:[id number] changes of the rectum with active oozing consistent with chronic radiation proctitis.  Otherwise, negative sigmoidoscopy to 40 cm.Status post argon plasma coagulation ablation    HOT HEMOSTASIS N/A  07/13/2013   Procedure: HOT HEMOSTASIS (ARGON PLASMA COAGULATION/BICAP);  Surgeon: Corbin Ade, MD;  [facility];  Service: Endoscopy;  Laterality: N/A;   Ileocolonoscopy  12/03/2011   IHK:VQQVZDG diverticulosis. Radiation-induced proctitis status post APC ablation. Status post segmental colon Biopsy   MALONEY DILATION N/A  12/31/2022   Procedure: MALONEY DILATION;  Surgeon: Corbin Ade, MD;  [facility];  Service: Endoscopy;  Laterality: N/A;   TONSILLECTOMY      Allergies  Allergen Reactions   Norvasc [Amlodipine Besylate] Swelling    Ankle swelling   Codeine Nausea And Vomiting   Amoxicillin Rash    Outpatient Encounter Medications as of 04/03/2023  Medication Sig   acetaminophen (TYLENOL) 500 MG tablet Take 2 tablets (1,000 mg total) by mouth every 8 (eight) hours as needed for mild pain (pain score 1-3).   acyclovir (ZOVIRAX) 800 MG tablet Take 800 mg by mouth See admin instructions. Take 800 mg daily, increase to 800 mg 3 times daily as needed when having a fever blister flare   ALPRAZolam (XANAX) 0.5 MG tablet Take 1 tablet (0.5 mg total) by mouth at bedtime as needed for sleep.   Ascorbic Acid (VITAMIN C) 1000 MG tablet Take 1,000 mg by mouth daily.   docusate sodium (COLACE) 100 MG capsule Take 1 capsule (100 mg total) by mouth 2 (two) times daily.   finasteride (PROSCAR) 5 MG tablet Take 1 tablet (5 mg total) by mouth daily.   fluticasone (CUTIVATE) 0.05 % cream SMARTSIG:Topical 1-2 Times Daily PRN   furosemide (LASIX) 40 MG tablet Take 40 mg by mouth daily.   levothyroxine (SYNTHROID) 25 MCG tablet Take 25 mcg by mouth daily.   meclizine (ANTIVERT) 12.5 MG tablet Take 1 tablet (12.5 mg total) by mouth 3 (three) times daily as needed for dizziness.   Melatonin 3 MG TABS Take 3 mg by mouth at bedtime. For sleep   methocarbamol (ROBAXIN) 500 MG tablet Take 1 tablet (500 mg total) by mouth every 8 (eight) hours as needed for muscle spasms.   oxyCODONE (OXY IR/ROXICODONE) 5 MG immediate release tablet Take 1 tablet (5 mg total) by mouth every 6 (six) hours as needed for severe pain (pain score 7-10).   pantoprazole (PROTONIX) 40 MG tablet Take 1 tablet (40 mg total) by mouth daily before breakfast.   Polyethyl Glycol-Propyl Glycol (SYSTANE OP) Place 1 drop into both eyes 2 (two) times  daily as needed (dry eyes).   polyethylene glycol (MIRALAX / GLYCOLAX) 17 g packet Take 17 g by mouth daily. Can be spread to every other day in the setting of diarrhea.   potassium chloride (KLOR-CON M) 10 MEQ tablet Take 1 tablet (10 mEq total) by mouth daily.   psyllium (HYDROCIL/METAMUCIL) 95 % PACK Take 1 packet by mouth 2 (two) times daily.   silodosin (RAPAFLO) 8 MG CAPS capsule Take 1 capsule (8 mg total) by mouth 2 (two) times daily.   simvastatin (ZOCOR) 20 MG tablet Take 20 mg by mouth every evening.   traMADol (ULTRAM) 50 MG tablet Take 1 tablet (50 mg total) by mouth every 6 (six) hours as needed.   traMADol (ULTRAM) 50 MG tablet Take 1 tablet (50 mg total) by mouth every 8 (eight) hours as needed for moderate pain (pain score 4-6).   vitamin B-12 (CYANOCOBALAMIN) 1000 MCG tablet Take 2,000 mcg by mouth daily.   sucralfate (CARAFATE) 1 GM/10ML suspension Provide 1 gram enema twice a day. (Patient not taking: Reported on  04/03/2023)   No facility-administered encounter medications on file as of 04/03/2023.    Review of Systems  Constitutional:  Negative for activity change, appetite change and fever.  HENT:  Negative for sore throat.   Eyes: Negative.   Cardiovascular:  Negative for chest pain and leg swelling.  Gastrointestinal:  Negative for abdominal distention, diarrhea and vomiting.  Genitourinary:  Negative for dysuria, frequency and urgency.  Musculoskeletal:  Positive for back pain.  Skin:  Negative for color change.  Neurological:  Negative for dizziness and headaches.  Psychiatric/Behavioral:  Negative for behavioral problems and sleep disturbance. The patient is not nervous/anxious.       Immunization History  Administered Date(s) Administered   Fluad Quad(high Dose 65+) 02/08/2019   Influenza, High Dose Seasonal PF 02/23/2018   PFIZER(Purple Top)SARS-COV-2 Vaccination 06/30/2019, 07/25/2019, 03/10/2020   Pfizer(Comirnaty)Fall Seasonal Vaccine 12 years and older  02/19/2023   Zoster Recombinant(Shingrix) 02/01/2018, 08/05/2018   Pertinent  Health Maintenance Due  Topic Date Due   INFLUENZA VACCINE  12/25/2022      06/17/2018    6:19 AM 06/12/2020    9:49 AM 08/10/2021    1:25 PM  Fall Risk  (RETIRED) Patient Fall Risk Level Low fall risk Moderate fall risk Low fall risk     Vitals:   04/03/23 0949  BP: 103/63  Pulse: 72  Resp: 18  Temp: 98 F (36.7 C)  SpO2: 96%  Weight: 142 lb 3.2 oz (64.5 kg)  Height: 5\' 6"  (1.676 m)   Body mass index is 22.95 kg/m.  Physical Exam Constitutional:      Appearance: Normal appearance.  HENT:     Head: Normocephalic and atraumatic.     Mouth/Throat:     Mouth: Mucous membranes are moist.  Eyes:     Conjunctiva/sclera: Conjunctivae normal.  Cardiovascular:     Rate and Rhythm: Normal rate and regular rhythm.     Pulses: Normal pulses.     Heart sounds: Normal heart sounds.  Pulmonary:     Effort: Pulmonary effort is normal.     Breath sounds: Normal breath sounds.     Comments: Wearing TLSO brace Abdominal:     General: Bowel sounds are normal.     Palpations: Abdomen is soft.  Musculoskeletal:        General: No swelling.     Cervical back: Normal range of motion.  Skin:    General: Skin is warm and dry.  Neurological:     Mental Status: He is alert. Mental status is at baseline.  Psychiatric:        Mood and Affect: Mood normal.        Behavior: Behavior normal.      Labs reviewed: Recent Labs    12/26/22 1232 12/26/22 1803 12/27/22 0359 12/27/22 0959 03/30/23 1817 03/31/23 0423 04/01/23 0505  NA 135   < > 139   < > 138 135 136  K 2.1*   < > 2.9*   < > 3.0* 2.9* 4.1  CL 88*   < > 98   < > 101 104 105  CO2 36*   < > 35*   < > 25 24 26   GLUCOSE 151*   < > 137*   < > 131* 129* 95  BUN 19   < > 11   < > 18 18 12   CREATININE 1.33*   < > 1.01   < > 0.93 1.03 0.88  CALCIUM 8.5*   < > 8.3*   < >  8.3* 7.9* 8.1*  MG 1.8  --  2.1  --   --  2.1  --   PHOS  --   --   --   --    --  3.1  --    < > = values in this interval not displayed.   Recent Labs    03/30/23 1817 03/31/23 0423 04/01/23 0505  AST 24 19 16   ALT 25 22 19   ALKPHOS 132* 113 107  BILITOT 0.9 0.8 0.9  PROT 5.9* 5.2* 5.1*  ALBUMIN 2.6* 2.3* 2.3*   Recent Labs    12/26/22 1232 12/26/22 2032 12/27/22 0359 12/31/22 1132 03/30/23 1817 03/31/23 0423 03/31/23 1323 04/01/23 0419  WBC 9.2  --  11.9*  --  14.8* 12.4*  --  9.9  NEUTROABS 6.9  --  9.6*  --  12.8*  --   --   --   HGB 13.5   < > 12.6*   < > 13.5 11.7* 12.3* 10.2*  HCT 40.4   < > 38.2*   < > 40.1 35.2* 37.7* 30.6*  MCV 91.8  --  92.5  --  89.1 89.6  --  91.1  PLT 289  --  259  --  369 332  --  305   < > = values in this interval not displayed.   Lab Results  Component Value Date   TSH 3.627 03/31/2023   Lab Results  Component Value Date   HGBA1C 5.9 06/06/2010   Lab Results  Component Value Date   CHOL 170 06/06/2010   HDL 34 06/06/2010   LDLCALC 101 06/06/2010   TRIG 177 06/06/2010    Significant Diagnostic Results in last 30 days:  CT CHEST WO CONTRAST  Result Date: 03/31/2023 CLINICAL DATA:  Follow-up pulmonary nodule. Left lower quadrant abdominal pain. Lower GI bleed. EXAM: CT CHEST WITHOUT CONTRAST CT ABDOMEN AND PELVIS WITH CONTRAST TECHNIQUE: Multidetector CT imaging of the chest was performed following the standard protocol without IV contrast. Multidetector CT imaging of the abdomen and pelvis was performed following the standard protocol during bolus administration of intravenous contrast. RADIATION DOSE REDUCTION: This exam was performed according to the departmental dose-optimization program which includes automated exposure control, adjustment of the mA and/or kV according to patient size and/or use of iterative reconstruction technique. CONTRAST:  OMNIPAQUE IOHEXOL 300 MG/ML  SOLN COMPARISON:  Chest radiograph dated 03/05/2023. Lumbar spine CT dated 03/24/2023. CT abdomen/pelvis dated 06/17/2018.  FINDINGS: CT CHEST FINDINGS Cardiovascular: The heart is normal in size. No pericardial effusion. No evidence of thoracic aneurysm. Atherosclerotic calcifications of the aortic root/arch. Severe three-vessel coronary atherosclerosis. Mediastinum/Nodes: Small mediastinal nodes, including a dominant 10 mm short axis subcarinal node (series 1/image 72). Visualized thyroid is unremarkable. Lungs/Pleura: 2.4 x 1.5 x 2.2 cm spiculated nodule in the anterior left upper lobe (coronal image 63), highly suspicious for primary bronchogenic carcinoma. Mild right basilar opacity, likely atelectasis. Mild patchy left lower lobe opacity, favoring pneumonia over atelectasis. Moderate centrilobular and paraseptal emphysematous changes. 5 x 15 mm irregular platelike nodule in the posterior right upper lobe along the major fissure (series 4/image 34). Trace left pleural effusion.  No pneumothorax. Musculoskeletal: Degenerative changes of the thoracic spine. Mild anterior wedging at T9 (sagittal image 86). CT ABDOMEN PELVIS FINDINGS Hepatobiliary: Liver is within normal limits. Status post cholecystectomy. No intrahepatic or extrahepatic duct dilatation. Pancreas: Within normal limits. Spleen: Within normal limits. Adrenals/Urinary Tract: Adrenal glands are within normal limits. 5.4 cm simple right lower  pole renal cyst (series 4/image 43). Kidneys are otherwise within normal limits. No hydronephrosis. Bladder is notable for mild eccentric wall thickening on the left (series 4/image 74), although underdistended. Stomach/Bowel: Stomach is within normal limits. No evidence of bowel obstruction. Normal appendix (series 4/image 50). Mild rectal wall thickening (series 4/image 39), raising the possibility of infectious/inflammatory proctitis. Associated trace presacral fluid. Vascular/Lymphatic: No evidence of abdominal aortic aneurysm. Atherosclerotic calcifications of the abdominal aorta and branch vessels, although vessels remain patent.  No suspicious abdominopelvic lymphadenopathy. Reproductive: Prostate is notable for dystrophic calcifications and fiducial markers. Other: No abdominopelvic ascites. Musculoskeletal: Moderate compression fracture deformity at L4 with mild retropulsion, unchanged from recent CT. Mild degenerative changes of the lumbar spine. IMPRESSION: 2.4 cm spiculated nodule in the anterior left upper lobe, highly suspicious for primary bronchogenic carcinoma. Contralateral 5 x 15 mm irregular nodule along the posterior right upper lobe, warranting attention on follow-up. Small mediastinal nodes, including a dominant 10 mm short axis subcarinal node, indeterminate. Mild patchy left lower lobe opacity, favoring pneumonia over atelectasis. Trace left pleural effusion. Mild rectal wall thickening, raising the possibility of infectious/inflammatory proctitis. Additional ancillary findings as above. Aortic Atherosclerosis (ICD10-I70.0) and Emphysema (ICD10-J43.9). Electronically Signed   By: Charline Bills M.D.   On: 03/31/2023 20:05   CT ABDOMEN PELVIS W CONTRAST  Result Date: 03/31/2023 CLINICAL DATA:  Follow-up pulmonary nodule. Left lower quadrant abdominal pain. Lower GI bleed. EXAM: CT CHEST WITHOUT CONTRAST CT ABDOMEN AND PELVIS WITH CONTRAST TECHNIQUE: Multidetector CT imaging of the chest was performed following the standard protocol without IV contrast. Multidetector CT imaging of the abdomen and pelvis was performed following the standard protocol during bolus administration of intravenous contrast. RADIATION DOSE REDUCTION: This exam was performed according to the departmental dose-optimization program which includes automated exposure control, adjustment of the mA and/or kV according to patient size and/or use of iterative reconstruction technique. CONTRAST:  OMNIPAQUE IOHEXOL 300 MG/ML  SOLN COMPARISON:  Chest radiograph dated 03/05/2023. Lumbar spine CT dated 03/24/2023. CT abdomen/pelvis dated 06/17/2018.  FINDINGS: CT CHEST FINDINGS Cardiovascular: The heart is normal in size. No pericardial effusion. No evidence of thoracic aneurysm. Atherosclerotic calcifications of the aortic root/arch. Severe three-vessel coronary atherosclerosis. Mediastinum/Nodes: Small mediastinal nodes, including a dominant 10 mm short axis subcarinal node (series 1/image 72). Visualized thyroid is unremarkable. Lungs/Pleura: 2.4 x 1.5 x 2.2 cm spiculated nodule in the anterior left upper lobe (coronal image 63), highly suspicious for primary bronchogenic carcinoma. Mild right basilar opacity, likely atelectasis. Mild patchy left lower lobe opacity, favoring pneumonia over atelectasis. Moderate centrilobular and paraseptal emphysematous changes. 5 x 15 mm irregular platelike nodule in the posterior right upper lobe along the major fissure (series 4/image 34). Trace left pleural effusion.  No pneumothorax. Musculoskeletal: Degenerative changes of the thoracic spine. Mild anterior wedging at T9 (sagittal image 86). CT ABDOMEN PELVIS FINDINGS Hepatobiliary: Liver is within normal limits. Status post cholecystectomy. No intrahepatic or extrahepatic duct dilatation. Pancreas: Within normal limits. Spleen: Within normal limits. Adrenals/Urinary Tract: Adrenal glands are within normal limits. 5.4 cm simple right lower pole renal cyst (series 4/image 43). Kidneys are otherwise within normal limits. No hydronephrosis. Bladder is notable for mild eccentric wall thickening on the left (series 4/image 74), although underdistended. Stomach/Bowel: Stomach is within normal limits. No evidence of bowel obstruction. Normal appendix (series 4/image 50). Mild rectal wall thickening (series 4/image 39), raising the possibility of infectious/inflammatory proctitis. Associated trace presacral fluid. Vascular/Lymphatic: No evidence of abdominal aortic aneurysm. Atherosclerotic calcifications of  the abdominal aorta and branch vessels, although vessels remain patent.  No suspicious abdominopelvic lymphadenopathy. Reproductive: Prostate is notable for dystrophic calcifications and fiducial markers. Other: No abdominopelvic ascites. Musculoskeletal: Moderate compression fracture deformity at L4 with mild retropulsion, unchanged from recent CT. Mild degenerative changes of the lumbar spine. IMPRESSION: 2.4 cm spiculated nodule in the anterior left upper lobe, highly suspicious for primary bronchogenic carcinoma. Contralateral 5 x 15 mm irregular nodule along the posterior right upper lobe, warranting attention on follow-up. Small mediastinal nodes, including a dominant 10 mm short axis subcarinal node, indeterminate. Mild patchy left lower lobe opacity, favoring pneumonia over atelectasis. Trace left pleural effusion. Mild rectal wall thickening, raising the possibility of infectious/inflammatory proctitis. Additional ancillary findings as above. Aortic Atherosclerosis (ICD10-I70.0) and Emphysema (ICD10-J43.9). Electronically Signed   By: Charline Bills M.D.   On: 03/31/2023 20:05   CT HEAD WO CONTRAST ( )  Result Date: 03/31/2023 CLINICAL DATA:  Metastatic disease evaluation. EXAM: CT HEAD WITHOUT CONTRAST TECHNIQUE: Contiguous axial images were obtained from the base of the skull through the vertex without intravenous contrast. RADIATION DOSE REDUCTION: This exam was performed according to the departmental dose-optimization program which includes automated exposure control, adjustment of the mA and/or kV according to patient size and/or use of iterative reconstruction technique. COMPARISON:  None Available. FINDINGS: Brain: No evidence of acute infarction, hemorrhage, hydrocephalus, extra-axial collection or mass lesion/mass effect. Generalized brain atrophy. Mild chronic small vessel ischemia in the cerebral white matter. Small chronic left cerebellar infarct. Vascular: No hyperdense vessel or unexpected calcification. Skull: Normal. Negative for fracture or focal  lesion. Sinuses/Orbits: No acute finding. IMPRESSION: Aging brain without acute superimposed finding. Electronically Signed   By: Tiburcio Pea M.D.   On: 03/31/2023 20:01   CT Lumbar Spine Wo Contrast  Result Date: 03/24/2023 CLINICAL DATA:  Fall EXAM: CT LUMBAR SPINE WITHOUT CONTRAST TECHNIQUE: Multidetector CT imaging of the lumbar spine was performed without intravenous contrast administration. Multiplanar CT image reconstructions were also generated. RADIATION DOSE REDUCTION: This exam was performed according to the departmental dose-optimization program which includes automated exposure control, adjustment of the mA and/or kV according to patient size and/or use of iterative reconstruction technique. COMPARISON:  08/21/2021 lumbar spine MRI FINDINGS: Segmentation: 5 lumbar type vertebrae. Alignment: Normal. Vertebrae: Acute/subacute burst fracture of L4 with greater than 50% height loss. Retropulsion 4 mm. Paraspinal and other soft tissues: Calcific aortic atherosclerosis. Disc levels: No bony spinal canal stenosis. IMPRESSION: 1. Acute/subacute burst fracture of L4 with greater than 50% height loss and 4 mm retropulsion. Aortic Atherosclerosis (ICD10-I70.0). Electronically Signed   By: Deatra Robinson M.D.   On: 03/24/2023 21:01   DG Lumbar Spine Complete  Result Date: 03/24/2023 CLINICAL DATA:  Fall with back pain. EXAM: LUMBAR SPINE - COMPLETE 4+ VIEW COMPARISON:  Lumbar radiograph 07/02/2021, MRI 08/21/2021 FINDINGS: The bones are subjectively under mineralized. Moderate L4 compression deformity with at least 30% loss of height of the superior endplate. This is age indeterminate but new from prior exam. No additional fracture or compression deformity. The alignment is normal. Anterior spurring at multiple levels. Side L3-L4 disc space narrowing. Lower lumbar facet hypertrophy. Sacroiliac joints are congruent. IMPRESSION: 1. Moderate L4 compression deformity, age indeterminate but new from May  2023. 2. Mild degenerative disc disease and facet hypertrophy. Electronically Signed   By: Narda Rutherford M.D.   On: 03/24/2023 17:16   DG Chest 2 View  Result Date: 03/24/2023 CLINICAL DATA:  generalized hyperhidrosis Shortness of breath for several months.  Cough. EXAM: CHEST - 2 VIEW COMPARISON:  02/23/2018 FINDINGS: Questionable 19 mm nodular opacity in the left upper lung zone, not seen on prior exam. Alternatively this may be related to summation shadows. Minor bibasilar atelectasis. The heart is normal in size. Stable mediastinal contours, aortic atherosclerosis. No pulmonary edema, large pleural effusion or pneumothorax. Mild thoracic spondylosis. No acute osseous findings. IMPRESSION: 1. Questionable 19 mm nodular opacity in the left upper lung zone, not seen on prior exam. Alternatively this may be related to summation shadows. Recommend further evaluation with chest CT. 2. Minor bibasilar atelectasis. 3.  Aortic Atherosclerosis (ICD10-I70.0). Electronically Signed   By: Narda Rutherford M.D.   On: 03/24/2023 17:13   DG Pelvis 1-2 Views  Result Date: 03/24/2023 CLINICAL DATA:  Status post fall with pain. EXAM: PELVIS - 1-2 VIEW COMPARISON:  Pelvis and left hip radiograph 11/06/2022 FINDINGS: The bones are subjectively under mineralized. The cortical margins of the bony pelvis are intact. No fracture. Pubic symphysis and sacroiliac joints are congruent. Both femoral heads are well-seated in the respective acetabula. Mild bilateral hip osteoarthritis, without significant interval change. IMPRESSION: 1. No pelvic fracture. 2. Mild bilateral hip osteoarthritis. Electronically Signed   By: Narda Rutherford M.D.   On: 03/24/2023 17:11   ECHOCARDIOGRAM COMPLETE  Result Date: 03/11/2023    ECHOCARDIOGRAM REPORT   Patient Name:   Calvin Moreno Date of Exam: 03/11/2023 Medical Rec #:  [id number]        Height:       66.0 in Accession #:    [id number]       Weight:       156.0 lb Date of Birth:   Jun 12, 1940        BSA:          1.799 m Patient Age:    81 years         BP:           112/73 mmHg Patient Gender: M                HR:           90 bpm. [facility]am [facility] Procedure: 2D Echo, Cardiac Doppler and Color Doppler Indications:    Aortic Stenosis  History:        Patient has prior history of Echocardiogram examinations, most                 recent 12/26/2021. Stroke, Aortic Valve Disease, Arrythmias:LBBB,                 Signs/Symptoms:Murmur; Risk Factors:Hypertension, Dyslipidemia                 and Former Smoker.  Sonographer:    Celesta Gentile RCS Referring Phys: [id number] Dorothe Pea BRANCH IMPRESSIONS  1. Left ventricular ejection fraction, by estimation, is 60 to 65%. The left ventricle has normal function. The left ventricle has no regional wall motion abnormalities. There is moderate left ventricular hypertrophy. Left ventricular diastolic parameters are consistent with Grade I diastolic dysfunction (impaired relaxation). Elevated left atrial pressure.  2. Right ventricular systolic function is normal. The right ventricular size is normal. Tricuspid regurgitation signal is inadequate for assessing PA pressure.  3. The mitral valve is abnormal. Mild mitral valve regurgitation. No evidence of mitral stenosis. The mean mitral valve gradient is 3.0 mmHg.HR 65 bpm. Moderate mitral annular calcification.  4. The aortic valve was not well visualized. There is severe calcifcation of the aortic valve.  There is severe thickening of the aortic valve. Aortic valve regurgitation is mild. Severe aortic valve stenosis. Aortic valve mean gradient measures 39.0 mmHg. Aortic valve peak gradient measures 50.1 mmHg. Aortic valve area, by VTI measures 0.93 cm.  5. The inferior vena cava is normal in size with greater than 50% respiratory variability, suggesting right atrial pressure of 3 mmHg. FINDINGS  Left Ventricle: Left ventricular ejection fraction, by estimation, is 60 to 65%. The left ventricle has normal  function. The left ventricle has no regional wall motion abnormalities. The left ventricular internal cavity size was normal in size. There is  moderate left ventricular hypertrophy. Left ventricular diastolic parameters are consistent with Grade I diastolic dysfunction (impaired relaxation). Elevated left atrial pressure. Right Ventricle: The right ventricular size is normal. Right vetricular wall thickness was not well visualized. Right ventricular systolic function is normal. Tricuspid regurgitation signal is inadequate for assessing PA pressure. Left Atrium: Left atrial size was normal in size. Right Atrium: Right atrial size was normal in size. Pericardium: There is no evidence of pericardial effusion. Mitral Valve: The mitral valve is abnormal. There is moderate thickening of the mitral valve leaflet(s). There is moderate calcification of the mitral valve leaflet(s). Moderate mitral annular calcification. Mild mitral valve regurgitation. No evidence of mitral valve stenosis. MV peak gradient, 11.6 mmHg. The mean mitral valve gradient is 3.0 mmHg. Tricuspid Valve: The tricuspid valve is normal in structure. Tricuspid valve regurgitation is not demonstrated. No evidence of tricuspid stenosis. Aortic Valve: The aortic valve was not well visualized. There is severe calcifcation of the aortic valve. There is severe thickening of the aortic valve. There is severe aortic valve annular calcification. Aortic valve regurgitation is mild. Aortic regurgitation PHT measures 460 msec. Severe aortic stenosis is present. Aortic valve mean gradient measures 39.0 mmHg. Aortic valve peak gradient measures 50.1 mmHg. Aortic valve area, by VTI measures 0.93 cm. Pulmonic Valve: The pulmonic valve was not well visualized. Pulmonic valve regurgitation is not visualized. No evidence of pulmonic stenosis. Aorta: The aortic root is normal in size and structure. Venous: The inferior vena cava is normal in size with greater than 50%  respiratory variability, suggesting right atrial pressure of 3 mmHg. IAS/Shunts: No atrial level shunt detected by color flow Doppler.  LEFT VENTRICLE PLAX 2D LVIDd:         3.40 cm   Diastology LVIDs:         2.00 cm   LV e' medial:    3.86 cm/s LV PW:         1.40 cm   LV E/e' medial:  20.3 LV IVS:        1.30 cm   LV e' lateral:   5.55 cm/s LVOT diam:     2.00 cm   LV E/e' lateral: 14.1 LV SV:         73 LV SV Index:   41 LVOT Area:     3.14 cm  RIGHT VENTRICLE RV S prime:     8.16 cm/s TAPSE (M-mode): 2.0 cm LEFT ATRIUM             Index        RIGHT ATRIUM           Index LA diam:        3.40 cm 1.89 cm/m   RA Area:     14.50 cm LA Vol (A2C):   76.1 ml 42.29 ml/m  RA Volume:   30.40 ml  16.89 ml/m LA  Vol (A4C):   45.6 ml 25.34 ml/m LA Biplane Vol: 58.4 ml 32.46 ml/m  AORTIC VALVE AV Area (Vmax):    0.90 cm AV Area (Vmean):   0.92 cm AV Area (VTI):     0.93 cm AV Vmax:           354.00 cm/s AV Vmean:          259.750 cm/s AV VTI:            0.786 m AV Peak Grad:      50.1 mmHg AV Mean Grad:      39.0 mmHg LVOT Vmax:         101.00 cm/s LVOT Vmean:        76.100 cm/s LVOT VTI:          0.232 m LVOT/AV VTI ratio: 0.30 AI PHT:            460 msec  AORTA Ao Root diam: 3.70 cm MITRAL VALVE MV Area (PHT): 2.20 cm     SHUNTS MV Peak grad:  11.6 mmHg    Systemic VTI:  0.23 m MV Mean grad:  3.0 mmHg     Systemic Diam: 2.00 cm MV Vmax:       1.70 m/s MV Vmean:      63.7 cm/s MV Decel Time: 345 msec MV E velocity: 78.50 cm/s MV A velocity: 170.00 cm/s MV E/A ratio:  0.46 Dina Rich MD Electronically signed by Dina Rich MD Signature Date/Time: 03/11/2023/5:03:24 PM    Final     Assessment/Plan  1. Closed burst fracture of lumbar vertebra, sequela -   continue TLSO brace when out of bed -   follow up with neurosurgery -   Discontinue Tramadol -   continue Oxycodone IR 5 mg Q 6 hours PRN and Acetaminophen 1,000 mg Q 8 hours PRN for pain -   continue Methocarbamol PRN -  continue PT and OT for  therapeutic strengthening exercises -   fall precautions  2. Gastrointestinal hemorrhage, unspecified gastrointestinal hemorrhage type -   Has history of radiation proctitis and was treated with APC on multiple occasions -  FOBT was positive -    CT abdomen pelvis demonstrating colitis changes -   S/P sigmoidoscopy demonstrating large mucosal ulceration to; lesion has been biopsied -    Nonbleeding colonicectasis treated with argon plasma coagulation man-hours -   Continue Carafate enema twice a day -    Continue MiraLAX and docusate sodium -    Follow-up with GI  3. History of prostate cancer    -   continue Silodosin, Finasteride  4. Lung nodules -  has a 2.4 cm spiculated nodule in the anterior left upper lobe highly suspicious for primary bronchogenic carcinoma -   follow up with pulmonology  5. Gastroesophageal reflux disease, unspecified whether esophagitis present -   continue Protonix  6. History of CVA -  ASA on hold due to concerns for GI bleed -  continue Simvastatin   Family/ staff Communication: Discussed plan of care with resident and charge nurse.  Labs/tests ordered:  None    Kenard Gower, DNP, MSN, FNP-BC [facility] [phone] (Monday-Friday 8:00 a.m. - 5:00 p.m.) [phone] (after hours)

## 2023-04-04 NOTE — Anesthesia Postprocedure Evaluation (Signed)
Anesthesia Post Note  Patient: Calvin Moreno  Procedure(s) Performed: FLEXIBLE SIGMOIDOSCOPY HOT HEMOSTASIS (ARGON PLASMA COAGULATION/BICAP)  Anesthesia Type: General Anesthetic complications: no   No notable events documented.   Last Vitals:  Vitals:   04/02/23 1145 04/02/23 1200  BP: (!) 92/43 (!) 116/49  Pulse: (!) 59 67  Resp: 16 17  Temp: 36.6 C 36.7 C  SpO2: 98% 100%    Last Pain:  Vitals:   04/02/23 1130  TempSrc:   PainSc: 0-No pain                 Windell Norfolk

## 2023-04-06 LAB — COMPREHENSIVE METABOLIC PANEL
Calcium: 8.5 — AB (ref 8.7–10.7)
eGFR: 85

## 2023-04-06 LAB — BASIC METABOLIC PANEL
BUN: 8 (ref 4–21)
CO2: 30 — AB (ref 13–22)
Chloride: 102 (ref 99–108)
Creatinine: 0.9 (ref 0.6–1.3)
Glucose: 108
Potassium: 4.2 meq/L (ref 3.5–5.1)
Sodium: 137 (ref 137–147)

## 2023-04-06 LAB — CBC AND DIFFERENTIAL
HCT: 33 — AB (ref 41–53)
Hemoglobin: 10.5 — AB (ref 13.5–17.5)
Platelets: 331 10*3/uL (ref 150–400)
WBC: 6.4

## 2023-04-06 LAB — CBC: RBC: 3.48 — AB (ref 3.87–5.11)

## 2023-04-06 NOTE — Progress Notes (Signed)
I reviewed the pathology results. Ann, can you send her a letter with the findings as described below please?  Thanks,  Vista Lawman, MD Gastroenterology and Hepatology [facility]  ---------------------------------------------------------------------------------------------  [facility] [address], [place], [place] [address] Phone:  [phone]   04/06/23 [place], [place]   Dear [name],  I am writing to inform you that the biopsies taken during your recent endoscopic examination showed:  Biopsies of the rectal mucosa did show ulcer but did not demonstrate any infection or malignancy.  I recommend continue to follow-up with [place] in GI clinic to possibly repeat flexible sigmoidoscopy in next couple months, to assess for healing   Also recommend sucralfate enemas to augment in healing of rectal ulcer   Please call us at [phone] if you have persistent problems or have questions about your condition that have not been fully answered at this time.  Sincerely,  Vista Lawman, MD Gastroenterology and Hepatology

## 2023-04-08 ENCOUNTER — Ambulatory Visit
Admission: RE | Admit: 2023-04-08 | Discharge: 2023-04-08 | Disposition: A | Discharge status: Home / Self Care | Payer: Medicare Other | Source: Ambulatory Visit | Attending: Physician Assistant | Admitting: Physician Assistant

## 2023-04-08 ENCOUNTER — Other Ambulatory Visit: Payer: Self-pay | Admitting: Physician Assistant

## 2023-04-08 DIAGNOSIS — S32001A Stable burst fracture of unspecified lumbar vertebra, initial encounter for closed fracture: Secondary | ICD-10-CM | POA: Insufficient documentation

## 2023-04-09 ENCOUNTER — Encounter: Payer: Self-pay | Admitting: Adult Health

## 2023-04-09 ENCOUNTER — Non-Acute Institutional Stay (SKILLED_NURSING_FACILITY): Payer: Medicare Other | Admitting: Adult Health

## 2023-04-09 ENCOUNTER — Encounter (INDEPENDENT_AMBULATORY_CARE_PROVIDER_SITE_OTHER): Payer: Self-pay | Admitting: *Deleted

## 2023-04-09 DIAGNOSIS — K5901 Slow transit constipation: Secondary | ICD-10-CM | POA: Diagnosis not present

## 2023-04-09 DIAGNOSIS — S32040S Wedge compression fracture of fourth lumbar vertebra, sequela: Secondary | ICD-10-CM

## 2023-04-09 DIAGNOSIS — S32001S Stable burst fracture of unspecified lumbar vertebra, sequela: Secondary | ICD-10-CM | POA: Diagnosis not present

## 2023-04-09 NOTE — Progress Notes (Signed)
Location:  Other [facility].  Nursing Home Room Number: [facility] 108A Place of Service:  SNF (31) Provider:  Kenard Gower, DNP, FNP-BC  Patient Care Team: Benita Stabile, MD as PCP - General (Internal Medicine) Wyline Mood Dorothe Pea, MD as PCP - Cardiology (Cardiology) Jena Gauss Gerrit Friends, MD (Gastroenterology)  Extended Emergency Contact Information Primary Emergency Contact: [facility] Address: [address]          [place], [place] [address] Darden [place] of [place] Home Phone: [phone] Relation: Spouse Secondary Emergency Contact: Waleed, Naifeh Mobile Phone: [phone] Relation: Son  Code Status:  Full Code  Goals of care: Advanced Directive information    04/09/2023   10:57 AM  Advanced Directives  Does Patient Have a Medical Advance Directive? No  Does patient want to make changes to medical advance directive? No - Patient declined     Chief Complaint  Patient presents with   Acute Visit    Low Back Pain.     HPI:  Pt is a 82 y.o. male seen today for Low Back Pain. He is a resident of [facility]. He complains of low back pain. CT lumbar s burst fracture of L4pine without contrast done on 03/24/23 showed acute/subacute. MRI lumbar spine done on 04/08/23 showed acute to subacute compression fracture of L4.    He was seen in his room today wearing TLSO brace. Per charge nurse, daughter does not want patient to take Gabapentin. He complained of wanting to have bowel movement frequently. He takes Docusate, Miralax and Psyllium for constipation.    Past Medical History:  Diagnosis Date   Aortic stenosis, mod 2021   And insufficiency, evaluation by Dr. Daleen Squibb in 2000   Arthritis    Benign prostatic hypertrophy    PSA of 1.39 in 09/2010   Chronic kidney disease    Borderline; creatinine of 1.6 in 08/2009, but 1.15 in 05/2010   Chronic sinusitis    By MRI   Diverticulosis 2013   GERD (gastroesophageal reflux disease)    Hearing impaired     Hemorrhoids 2006   Hiatal hernia 2006   HOH (hard of hearing)    Hyperlipidemia    Elevated triglycerides   Hypertension    Mild internal carotid artery plaque on MRI/MRA in 2002   Hypothyroidism    IBS (irritable bowel syndrome)    Insomnia    Left bundle branch block 2012   2012   Metabolic syndrome    Fasting hyperglycemia   Murmur    Nephrolithiasis 08/2009   2011   Obesity    Proctitis    radiation induced   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   s/p XRT   Seasonal allergies    Stroke (HCC)    TIA, no residual, seen on MRI   Tobacco abuse, in remission    60 pack years; quit in 1999   Uses hearing aid    bilateral   Past Surgical History:  Procedure Laterality Date   ANKLE FRACTURE SURGERY     left   BIOPSY  12/31/2022   Procedure: BIOPSY;  Surgeon: Corbin Ade, MD;  [facility]: [facility];  Service: Endoscopy;;   CATARACT EXTRACTION Left    CATARACT EXTRACTION W/PHACO Right 12/19/2013   Procedure: CATARACT EXTRACTION PHACO AND INTRAOCULAR LENS PLACEMENT RIGHT EYE CDE=14.78;  Surgeon: Gemma Payor, MD;  Location: [facility];  Service: Ophthalmology;  Laterality: Right;   CHOLECYSTECTOMY     COLONOSCOPY  2006   Dr. Henry Russel mucosa  throughout colon, moderate internal hemorrhoids. bx= benign colonic mucosa   COLONOSCOPY  12/03/2011   Dr. Jena Gauss- colonic diverticulosis, radiation-induced proctitis- s/p APC ablation, no microscopic colitis on bx   COLONOSCOPY WITH PROPOFOL N/A 06/12/2020   Procedure: COLONOSCOPY WITH PROPOFOL;  Surgeon: Corbin Ade, MD;  Location: [facility];  Service: Endoscopy;  Laterality: N/A;  2:45pm   ESOPHAGOGASTRODUODENOSCOPY  2006   Dr. Randa Evens- small hiatal hernia and slightly reddened distal esophagus o/w normal   ESOPHAGOGASTRODUODENOSCOPY (EGD) WITH PROPOFOL N/A 12/31/2022   Procedure: ESOPHAGOGASTRODUODENOSCOPY (EGD) WITH PROPOFOL;  Surgeon: Corbin Ade, MD;  [facility]: [facility];  Service: Endoscopy;  Laterality: N/A;  1245pm, asa 3    FLEXIBLE SIGMOIDOSCOPY N/A 07/13/2013   UYQ:[id number] changes of the rectum with active oozing consistent with chronic radiation proctitis.  Otherwise, negative sigmoidoscopy to 40 cm.Status post argon plasma coagulation ablation    FLEXIBLE SIGMOIDOSCOPY N/A 04/02/2023   Procedure: FLEXIBLE SIGMOIDOSCOPY;  Surgeon: Franky Macho, MD;  [facility]: [facility];  Service: Endoscopy;  Laterality: N/A;   HOT HEMOSTASIS N/A 07/13/2013   Procedure: HOT HEMOSTASIS (ARGON PLASMA COAGULATION/BICAP);  Surgeon: Corbin Ade, MD;  [facility];  Service: Endoscopy;  Laterality: N/A;   HOT HEMOSTASIS  04/02/2023   Procedure: HOT HEMOSTASIS (ARGON PLASMA COAGULATION/BICAP);  Surgeon: Franky Macho, MD;  [facility];  Service: Endoscopy;;   Ileocolonoscopy  12/03/2011   OVF:IEPPIRJ diverticulosis. Radiation-induced proctitis status post APC ablation. Status post segmental colon Biopsy   MALONEY DILATION N/A 12/31/2022   Procedure: MALONEY DILATION;  Surgeon: Corbin Ade, MD;  [facility];  Service: Endoscopy;  Laterality: N/A;   TONSILLECTOMY      Allergies  Allergen Reactions   Norvasc [Amlodipine Besylate] Swelling    Ankle swelling   Codeine Nausea And Vomiting   Amoxicillin Rash    Outpatient Encounter Medications as of 04/09/2023  Medication Sig   acetaminophen (TYLENOL) 500 MG tablet Take 2 tablets (1,000 mg total) by mouth every 8 (eight) hours as needed for mild pain (pain score 1-3).   acyclovir (ZOVIRAX) 800 MG tablet Take 800 mg by mouth every 8 (eight) hours as needed. Give one tablet by mouth once daily.   ALPRAZolam (XANAX) 0.5 MG tablet Take 1 tablet (0.5 mg total) by mouth at bedtime as needed for sleep.   alum & mag hydroxide-simeth (MAALOX PLUS) 400-400-40 MG/5ML suspension Take 5 mLs by mouth every 4 (four) hours as needed for indigestion.   Ascorbic Acid (VITAMIN C) 1000 MG tablet Take 1,000 mg by mouth daily.   docusate sodium  (COLACE) 100 MG capsule Take 1 capsule (100 mg total) by mouth 2 (two) times daily.   DULoxetine (CYMBALTA) 30 MG capsule Take 30 mg by mouth daily.   finasteride (PROSCAR) 5 MG tablet Take 1 tablet (5 mg total) by mouth daily.   fluticasone (CUTIVATE) 0.05 % cream SMARTSIG:Topical 1-2 Times Daily PRN   furosemide (LASIX) 40 MG tablet Take 40 mg by mouth daily.   levothyroxine (SYNTHROID) 25 MCG tablet Take 25 mcg by mouth daily.   meclizine (ANTIVERT) 12.5 MG tablet Take 1 tablet (12.5 mg total) by mouth 3 (three) times daily as needed for dizziness.   Melatonin 3 MG TABS Take 3 mg by mouth at bedtime. For sleep   methocarbamol (ROBAXIN) 500 MG tablet Take 1 tablet (500 mg total) by mouth every 8 (eight) hours as needed for muscle spasms.   oxyCODONE (OXY IR/ROXICODONE) 5  MG immediate release tablet Take 1 tablet (5 mg total) by mouth every 6 (six) hours as needed for severe pain (pain score 7-10).   pantoprazole (PROTONIX) 40 MG tablet Take 1 tablet (40 mg total) by mouth daily before breakfast.   Polyethyl Glycol-Propyl Glycol (SYSTANE OP) Place 1 drop into both eyes 2 (two) times daily as needed (dry eyes).   polyethylene glycol (MIRALAX / GLYCOLAX) 17 g packet Take 17 g by mouth daily. Can be spread to every other day in the setting of diarrhea.   potassium chloride (KLOR-CON M) 10 MEQ tablet Take 1 tablet (10 mEq total) by mouth daily.   psyllium (HYDROCIL/METAMUCIL) 95 % PACK Take 1 packet by mouth 2 (two) times daily.   silodosin (RAPAFLO) 8 MG CAPS capsule Take 1 capsule (8 mg total) by mouth 2 (two) times daily.   simvastatin (ZOCOR) 20 MG tablet Take 20 mg by mouth every evening.   sucralfate (CARAFATE) 1 GM/10ML suspension Provide 1 gram enema twice a day.   traMADol (ULTRAM) 50 MG tablet Take 1 tablet (50 mg total) by mouth every 6 (six) hours as needed.   traMADol (ULTRAM) 50 MG tablet Take 1 tablet (50 mg total) by mouth every 8 (eight) hours as needed for moderate pain (pain score  4-6).   vitamin B-12 (CYANOCOBALAMIN) 1000 MCG tablet Take 2,000 mcg by mouth daily.   Zinc Oxide (TRIPLE PASTE) 12.8 % ointment Apply 1 Application topically. Every shift.   No facility-administered encounter medications on file as of 04/09/2023.    Review of Systems  Constitutional:  Negative for activity change, appetite change and fever.  HENT:  Negative for sore throat.   Eyes: Negative.   Cardiovascular:  Negative for chest pain and leg swelling.  Gastrointestinal:  Negative for abdominal distention, diarrhea and vomiting.  Genitourinary:  Negative for dysuria, frequency and urgency.  Skin:  Negative for color change.  Neurological:  Negative for dizziness and headaches.  Psychiatric/Behavioral:  Negative for behavioral problems and sleep disturbance. The patient is not nervous/anxious.       Immunization History  Administered Date(s) Administered   Fluad Quad(high Dose 65+) 02/08/2019   Influenza, High Dose Seasonal PF 02/23/2018   PFIZER(Purple Top)SARS-COV-2 Vaccination 06/30/2019, 07/25/2019, 03/10/2020, 02/15/2021, 04/04/2022   Pfizer(Comirnaty)Fall Seasonal Vaccine 12 years and older 02/19/2023   Zoster Recombinant(Shingrix) 02/01/2018, 08/05/2018   Pertinent  Health Maintenance Due  Topic Date Due   INFLUENZA VACCINE  12/25/2022      06/17/2018    6:19 AM 06/12/2020    9:49 AM 08/10/2021    1:25 PM  Fall Risk  (RETIRED) Patient Fall Risk Level Low fall risk Moderate fall risk Low fall risk     Vitals:   04/09/23 1046  BP: 123/75  Pulse: 93  Resp: 20  Temp: 97.9 F (36.6 C)  SpO2: 98%  Weight: 144 lb (65.3 kg)  Height: 5\' 6"  (1.676 m)   Body mass index is 23.24 kg/m.  Physical Exam Constitutional:      General: He is not in acute distress.    Appearance: Normal appearance.  HENT:     Head: Normocephalic and atraumatic.     Mouth/Throat:     Mouth: Mucous membranes are moist.  Eyes:     Conjunctiva/sclera: Conjunctivae normal.  Cardiovascular:      Rate and Rhythm: Normal rate and regular rhythm.     Pulses: Normal pulses.     Heart sounds: Murmur heard.  Pulmonary:     Effort:  Pulmonary effort is normal.     Breath sounds: Normal breath sounds.  Abdominal:     General: Bowel sounds are normal.     Palpations: Abdomen is soft.  Musculoskeletal:        General: No swelling.     Cervical back: Normal range of motion.     Comments: Wears TLSO brace  Skin:    General: Skin is warm and dry.  Neurological:     Mental Status: He is alert and oriented to person, place, and time. Mental status is at baseline.  Psychiatric:        Mood and Affect: Mood normal.        Behavior: Behavior normal.      Labs reviewed: Recent Labs    12/26/22 1232 12/26/22 1803 12/27/22 0359 12/27/22 0959 03/30/23 1817 03/31/23 0423 04/01/23 0505 04/06/23 0000  NA 135   < > 139   < > 138 135 136 137  K 2.1*   < > 2.9*   < > 3.0* 2.9* 4.1 4.2  CL 88*   < > 98   < > 101 104 105 102  CO2 36*   < > 35*   < > 25 24 26  30*  GLUCOSE 151*   < > 137*   < > 131* 129* 95  --   BUN 19   < > 11   < > 18 18 12 8   CREATININE 1.33*   < > 1.01   < > 0.93 1.03 0.88 0.9  CALCIUM 8.5*   < > 8.3*   < > 8.3* 7.9* 8.1* 8.5*  MG 1.8  --  2.1  --   --  2.1  --   --   PHOS  --   --   --   --   --  3.1  --   --    < > = values in this interval not displayed.   Recent Labs    03/30/23 1817 03/31/23 0423 04/01/23 0505  AST 24 19 16   ALT 25 22 19   ALKPHOS 132* 113 107  BILITOT 0.9 0.8 0.9  PROT 5.9* 5.2* 5.1*  ALBUMIN 2.6* 2.3* 2.3*   Recent Labs    12/26/22 1232 12/26/22 2032 12/27/22 0359 12/31/22 1132 03/30/23 1817 03/31/23 0423 03/31/23 1323 04/01/23 0419 04/06/23 0000  WBC 9.2  --  11.9*  --  14.8* 12.4*  --  9.9 6.4  NEUTROABS 6.9  --  9.6*  --  12.8*  --   --   --   --   HGB 13.5   < > 12.6*   < > 13.5 11.7* 12.3* 10.2* 10.5*  HCT 40.4   < > 38.2*   < > 40.1 35.2* 37.7* 30.6* 33*  MCV 91.8  --  92.5  --  89.1 89.6  --  91.1  --   PLT 289   --  259  --  369 332  --  305 331   < > = values in this interval not displayed.   Lab Results  Component Value Date   TSH 3.627 03/31/2023   Lab Results  Component Value Date   HGBA1C 5.9 06/06/2010   Lab Results  Component Value Date   CHOL 170 06/06/2010   HDL 34 06/06/2010   LDLCALC 101 06/06/2010   TRIG 177 06/06/2010    Significant Diagnostic Results in last 30 days:  MR LUMBAR SPINE WO CONTRAST  Result Date: 04/08/2023 CLINICAL DATA:  Burst fracture of L4 on recent CT EXAM: MRI LUMBAR SPINE WITHOUT CONTRAST TECHNIQUE: Multiplanar, multisequence MR imaging of the lumbar spine was performed. No intravenous contrast was administered. COMPARISON:  08/21/2021 MRI lumbar spine, correlation is made with 03/24/2023 CT lumbar spine FINDINGS: Segmentation:  5 lumbar type vertebral bodies. Alignment: Overall dextrocurvature of the lumbar spine. No significant listhesis. Vertebrae: Redemonstrated compression fracture of L4 lower, with increased T2 signal throughout the vertebral body as well as a fluid-filled cleft. Up to 75% vertebral body height loss centrally, previously 60% on 03/24/2023. 6 mm retropulsion of the posterior cortex. Increased T2 signal extends into the bilateral pedicles. Otherwise normal marrow signal. No evidence of additional acute fracture, suspicious osseous lesion, or discitis. Conus medullaris and cauda equina: Conus extends to the L1 level. Conus and cauda equina appear normal. Paraspinal and other soft tissues: Large right renal cyst for which no follow-up is currently indicated. Increased T2 signal between the spinous processes of L4 and L5. Disc levels: T12-L1: No significant disc bulge. No spinal canal stenosis or neural foraminal narrowing. L1-L2: Minimal disc bulge. No spinal canal stenosis or neural foraminal narrowing. L2-L3: Mild disc bulge with right foraminal protrusion. No spinal canal stenosis. Mild right neural foraminal narrowing. L3-L4: Mild disc bulge  with central annular fissure. Mild facet arthropathy. Ligamentum flavum hypertrophy. At the disc level, there is moderate spinal canal stenosis, with moderate to severe spinal canal stenosis just inferior to this, the site of the L4 vertebral body retropulsion (series 12, image 12). Effacement of the lateral recesses. Moderate bilateral neural foraminal narrowing. L4-L5: Minimal disc bulge with left foraminal protrusion. Mild facet arthropathy, with redemonstrated left facet synovial cyst. Ligamentum flavum hypertrophy. Narrowing of the lateral recesses. Mild spinal canal stenosis. Moderate left and severe right neural foraminal narrowing. L5-S1: No significant disc bulge. No spinal canal stenosis or neural foraminal narrowing. IMPRESSION: 1. Acute to subacute compression fracture of L4, with up to 75% vertebral body height loss centrally, previously 60% on 03/24/2023. 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 2. L3-L4 moderate spinal canal stenosis and moderate bilateral neural foraminal narrowing. 3. L4-L5 mild spinal canal stenosis with moderate left and severe right neural foraminal narrowing. 4. L2-L3 mild right neural foraminal narrowing. 5. Increased T2 signal between the spinous processes of L4 and L5, which can be posttraumatic or seen in the setting of Baastrup's disease. Electronically Signed   By: Wiliam Ke M.D.   On: 04/08/2023 16:44   CT CHEST WO CONTRAST  Result Date: 03/31/2023 CLINICAL DATA:  Follow-up pulmonary nodule. Left lower quadrant abdominal pain. Lower GI bleed. EXAM: CT CHEST WITHOUT CONTRAST CT ABDOMEN AND PELVIS WITH CONTRAST TECHNIQUE: Multidetector CT imaging of the chest was performed following the standard protocol without IV contrast. Multidetector CT imaging of the abdomen and pelvis was performed following the  standard protocol during bolus administration of intravenous contrast. RADIATION DOSE REDUCTION: This exam was performed according to the departmental dose-optimization program which includes automated exposure control, adjustment of the mA and/or kV according to patient size and/or use of iterative reconstruction technique. CONTRAST:  OMNIPAQUE IOHEXOL 300 MG/ML  SOLN COMPARISON:  Chest radiograph dated 03/05/2023. Lumbar spine CT dated 03/24/2023. CT abdomen/pelvis dated 06/17/2018. FINDINGS: CT CHEST FINDINGS Cardiovascular: The heart is normal in size. No pericardial effusion. No evidence of thoracic aneurysm. Atherosclerotic calcifications  of the aortic root/arch. Severe three-vessel coronary atherosclerosis. Mediastinum/Nodes: Small mediastinal nodes, including a dominant 10 mm short axis subcarinal node (series 1/image 72). Visualized thyroid is unremarkable. Lungs/Pleura: 2.4 x 1.5 x 2.2 cm spiculated nodule in the anterior left upper lobe (coronal image 63), highly suspicious for primary bronchogenic carcinoma. Mild right basilar opacity, likely atelectasis. Mild patchy left lower lobe opacity, favoring pneumonia over atelectasis. Moderate centrilobular and paraseptal emphysematous changes. 5 x 15 mm irregular platelike nodule in the posterior right upper lobe along the major fissure (series 4/image 34). Trace left pleural effusion.  No pneumothorax. Musculoskeletal: Degenerative changes of the thoracic spine. Mild anterior wedging at T9 (sagittal image 86). CT ABDOMEN PELVIS FINDINGS Hepatobiliary: Liver is within normal limits. Status post cholecystectomy. No intrahepatic or extrahepatic duct dilatation. Pancreas: Within normal limits. Spleen: Within normal limits. Adrenals/Urinary Tract: Adrenal glands are within normal limits. 5.4 cm simple right lower pole renal cyst (series 4/image 43). Kidneys are otherwise within normal limits. No hydronephrosis. Bladder is notable for mild eccentric wall  thickening on the left (series 4/image 74), although underdistended. Stomach/Bowel: Stomach is within normal limits. No evidence of bowel obstruction. Normal appendix (series 4/image 50). Mild rectal wall thickening (series 4/image 39), raising the possibility of infectious/inflammatory proctitis. Associated trace presacral fluid. Vascular/Lymphatic: No evidence of abdominal aortic aneurysm. Atherosclerotic calcifications of the abdominal aorta and branch vessels, although vessels remain patent. No suspicious abdominopelvic lymphadenopathy. Reproductive: Prostate is notable for dystrophic calcifications and fiducial markers. Other: No abdominopelvic ascites. Musculoskeletal: Moderate compression fracture deformity at L4 with mild retropulsion, unchanged from recent CT. Mild degenerative changes of the lumbar spine. IMPRESSION: 2.4 cm spiculated nodule in the anterior left upper lobe, highly suspicious for primary bronchogenic carcinoma. Contralateral 5 x 15 mm irregular nodule along the posterior right upper lobe, warranting attention on follow-up. Small mediastinal nodes, including a dominant 10 mm short axis subcarinal node, indeterminate. Mild patchy left lower lobe opacity, favoring pneumonia over atelectasis. Trace left pleural effusion. Mild rectal wall thickening, raising the possibility of infectious/inflammatory proctitis. Additional ancillary findings as above. Aortic Atherosclerosis (ICD10-I70.0) and Emphysema (ICD10-J43.9). Electronically Signed   By: Charline Bills M.D.   On: 03/31/2023 20:05   CT ABDOMEN PELVIS W CONTRAST  Result Date: 03/31/2023 CLINICAL DATA:  Follow-up pulmonary nodule. Left lower quadrant abdominal pain. Lower GI bleed. EXAM: CT CHEST WITHOUT CONTRAST CT ABDOMEN AND PELVIS WITH CONTRAST TECHNIQUE: Multidetector CT imaging of the chest was performed following the standard protocol without IV contrast. Multidetector CT imaging of the abdomen and pelvis was performed following  the standard protocol during bolus administration of intravenous contrast. RADIATION DOSE REDUCTION: This exam was performed according to the departmental dose-optimization program which includes automated exposure control, adjustment of the mA and/or kV according to patient size and/or use of iterative reconstruction technique. CONTRAST:  OMNIPAQUE IOHEXOL 300 MG/ML  SOLN COMPARISON:  Chest radiograph dated 03/05/2023. Lumbar spine CT dated 03/24/2023. CT abdomen/pelvis dated 06/17/2018. FINDINGS: CT CHEST FINDINGS Cardiovascular: The heart is normal in size. No pericardial effusion. No evidence of thoracic aneurysm. Atherosclerotic calcifications of the aortic root/arch. Severe three-vessel coronary atherosclerosis. Mediastinum/Nodes: Small mediastinal nodes, including a dominant 10 mm short axis subcarinal node (series 1/image 72). Visualized thyroid is unremarkable. Lungs/Pleura: 2.4 x 1.5 x 2.2 cm spiculated nodule in the anterior left upper lobe (coronal image 63), highly suspicious for primary bronchogenic carcinoma. Mild right basilar opacity, likely atelectasis. Mild patchy left lower lobe opacity, favoring pneumonia over atelectasis. Moderate centrilobular and paraseptal emphysematous  changes. 5 x 15 mm irregular platelike nodule in the posterior right upper lobe along the major fissure (series 4/image 34). Trace left pleural effusion.  No pneumothorax. Musculoskeletal: Degenerative changes of the thoracic spine. Mild anterior wedging at T9 (sagittal image 86). CT ABDOMEN PELVIS FINDINGS Hepatobiliary: Liver is within normal limits. Status post cholecystectomy. No intrahepatic or extrahepatic duct dilatation. Pancreas: Within normal limits. Spleen: Within normal limits. Adrenals/Urinary Tract: Adrenal glands are within normal limits. 5.4 cm simple right lower pole renal cyst (series 4/image 43). Kidneys are otherwise within normal limits. No hydronephrosis. Bladder is notable for mild eccentric wall  thickening on the left (series 4/image 74), although underdistended. Stomach/Bowel: Stomach is within normal limits. No evidence of bowel obstruction. Normal appendix (series 4/image 50). Mild rectal wall thickening (series 4/image 39), raising the possibility of infectious/inflammatory proctitis. Associated trace presacral fluid. Vascular/Lymphatic: No evidence of abdominal aortic aneurysm. Atherosclerotic calcifications of the abdominal aorta and branch vessels, although vessels remain patent. No suspicious abdominopelvic lymphadenopathy. Reproductive: Prostate is notable for dystrophic calcifications and fiducial markers. Other: No abdominopelvic ascites. Musculoskeletal: Moderate compression fracture deformity at L4 with mild retropulsion, unchanged from recent CT. Mild degenerative changes of the lumbar spine. IMPRESSION: 2.4 cm spiculated nodule in the anterior left upper lobe, highly suspicious for primary bronchogenic carcinoma. Contralateral 5 x 15 mm irregular nodule along the posterior right upper lobe, warranting attention on follow-up. Small mediastinal nodes, including a dominant 10 mm short axis subcarinal node, indeterminate. Mild patchy left lower lobe opacity, favoring pneumonia over atelectasis. Trace left pleural effusion. Mild rectal wall thickening, raising the possibility of infectious/inflammatory proctitis. Additional ancillary findings as above. Aortic Atherosclerosis (ICD10-I70.0) and Emphysema (ICD10-J43.9). Electronically Signed   By: Charline Bills M.D.   On: 03/31/2023 20:05   CT HEAD WO CONTRAST ( )  Result Date: 03/31/2023 CLINICAL DATA:  Metastatic disease evaluation. EXAM: CT HEAD WITHOUT CONTRAST TECHNIQUE: Contiguous axial images were obtained from the base of the skull through the vertex without intravenous contrast. RADIATION DOSE REDUCTION: This exam was performed according to the departmental dose-optimization program which includes automated exposure control,  adjustment of the mA and/or kV according to patient size and/or use of iterative reconstruction technique. COMPARISON:  None Available. FINDINGS: Brain: No evidence of acute infarction, hemorrhage, hydrocephalus, extra-axial collection or mass lesion/mass effect. Generalized brain atrophy. Mild chronic small vessel ischemia in the cerebral white matter. Small chronic left cerebellar infarct. Vascular: No hyperdense vessel or unexpected calcification. Skull: Normal. Negative for fracture or focal lesion. Sinuses/Orbits: No acute finding. IMPRESSION: Aging brain without acute superimposed finding. Electronically Signed   By: Tiburcio Pea M.D.   On: 03/31/2023 20:01   CT Lumbar Spine Wo Contrast  Result Date: 03/24/2023 CLINICAL DATA:  Fall EXAM: CT LUMBAR SPINE WITHOUT CONTRAST TECHNIQUE: Multidetector CT imaging of the lumbar spine was performed without intravenous contrast administration. Multiplanar CT image reconstructions were also generated. RADIATION DOSE REDUCTION: This exam was performed according to the departmental dose-optimization program which includes automated exposure control, adjustment of the mA and/or kV according to patient size and/or use of iterative reconstruction technique. COMPARISON:  08/21/2021 lumbar spine MRI FINDINGS: Segmentation: 5 lumbar type vertebrae. Alignment: Normal. Vertebrae: Acute/subacute burst fracture of L4 with greater than 50% height loss. Retropulsion 4 mm. Paraspinal and other soft tissues: Calcific aortic atherosclerosis. Disc levels: No bony spinal canal stenosis. IMPRESSION: 1. Acute/subacute burst fracture of L4 with greater than 50% height loss and 4 mm retropulsion. Aortic Atherosclerosis (ICD10-I70.0). Electronically Signed   By: Caryn Bee  Chase Picket M.D.   On: 03/24/2023 21:01   DG Lumbar Spine Complete  Result Date: 03/24/2023 CLINICAL DATA:  Fall with back pain. EXAM: LUMBAR SPINE - COMPLETE 4+ VIEW COMPARISON:  Lumbar radiograph 07/02/2021, MRI  08/21/2021 FINDINGS: The bones are subjectively under mineralized. Moderate L4 compression deformity with at least 30% loss of height of the superior endplate. This is age indeterminate but new from prior exam. No additional fracture or compression deformity. The alignment is normal. Anterior spurring at multiple levels. Side L3-L4 disc space narrowing. Lower lumbar facet hypertrophy. Sacroiliac joints are congruent. IMPRESSION: 1. Moderate L4 compression deformity, age indeterminate but new from May 2023. 2. Mild degenerative disc disease and facet hypertrophy. Electronically Signed   By: Narda Rutherford M.D.   On: 03/24/2023 17:16   DG Pelvis 1-2 Views  Result Date: 03/24/2023 CLINICAL DATA:  Status post fall with pain. EXAM: PELVIS - 1-2 VIEW COMPARISON:  Pelvis and left hip radiograph 11/06/2022 FINDINGS: The bones are subjectively under mineralized. The cortical margins of the bony pelvis are intact. No fracture. Pubic symphysis and sacroiliac joints are congruent. Both femoral heads are well-seated in the respective acetabula. Mild bilateral hip osteoarthritis, without significant interval change. IMPRESSION: 1. No pelvic fracture. 2. Mild bilateral hip osteoarthritis. Electronically Signed   By: Narda Rutherford M.D.   On: 03/24/2023 17:11   ECHOCARDIOGRAM COMPLETE  Result Date: 03/11/2023    ECHOCARDIOGRAM REPORT   Patient Name:   Calvin Moreno Date of Exam: 03/11/2023 Medical Rec #:  [id number]        Height:       66.0 in Accession #:    [id number]       Weight:       156.0 lb Date of Birth:  02/10/1941        BSA:          1.799 m Patient Age:    81 years         BP:           112/73 mmHg Patient Gender: M                HR:           90 bpm. [facility] Procedure: 2D Echo, Cardiac Doppler and Color Doppler Indications:    Aortic Stenosis  History:        Patient has prior history of Echocardiogram examinations, most                 recent 12/26/2021. Stroke, Aortic Valve  Disease, Arrythmias:LBBB,                 Signs/Symptoms:Murmur; Risk Factors:Hypertension, Dyslipidemia                 and Former Smoker.  Sonographer:    Celesta Gentile RCS Referring Phys: [id number] Dorothe Pea BRANCH IMPRESSIONS  1. Left ventricular ejection fraction, by estimation, is 60 to 65%. The left ventricle has normal function. The left ventricle has no regional wall motion abnormalities. There is moderate left ventricular hypertrophy. Left ventricular diastolic parameters are consistent with Grade I diastolic dysfunction (impaired relaxation). Elevated left atrial pressure.  2. Right ventricular systolic function is normal. The right ventricular size is normal. Tricuspid regurgitation signal is inadequate for assessing PA pressure.  3. The mitral valve is abnormal. Mild mitral valve regurgitation. No evidence of mitral stenosis. The mean mitral valve gradient is 3.0 mmHg.HR 65 bpm. Moderate mitral annular calcification.  4.  The aortic valve was not well visualized. There is severe calcifcation of the aortic valve. There is severe thickening of the aortic valve. Aortic valve regurgitation is mild. Severe aortic valve stenosis. Aortic valve mean gradient measures 39.0 mmHg. Aortic valve peak gradient measures 50.1 mmHg. Aortic valve area, by VTI measures 0.93 cm.  5. The inferior vena cava is normal in size with greater than 50% respiratory variability, suggesting right atrial pressure of 3 mmHg. FINDINGS  Left Ventricle: Left ventricular ejection fraction, by estimation, is 60 to 65%. The left ventricle has normal function. The left ventricle has no regional wall motion abnormalities. The left ventricular internal cavity size was normal in size. There is  moderate left ventricular hypertrophy. Left ventricular diastolic parameters are consistent with Grade I diastolic dysfunction (impaired relaxation). Elevated left atrial pressure. Right Ventricle: The right ventricular size is normal. Right vetricular  wall thickness was not well visualized. Right ventricular systolic function is normal. Tricuspid regurgitation signal is inadequate for assessing PA pressure. Left Atrium: Left atrial size was normal in size. Right Atrium: Right atrial size was normal in size. Pericardium: There is no evidence of pericardial effusion. Mitral Valve: The mitral valve is abnormal. There is moderate thickening of the mitral valve leaflet(s). There is moderate calcification of the mitral valve leaflet(s). Moderate mitral annular calcification. Mild mitral valve regurgitation. No evidence of mitral valve stenosis. MV peak gradient, 11.6 mmHg. The mean mitral valve gradient is 3.0 mmHg. Tricuspid Valve: The tricuspid valve is normal in structure. Tricuspid valve regurgitation is not demonstrated. No evidence of tricuspid stenosis. Aortic Valve: The aortic valve was not well visualized. There is severe calcifcation of the aortic valve. There is severe thickening of the aortic valve. There is severe aortic valve annular calcification. Aortic valve regurgitation is mild. Aortic regurgitation PHT measures 460 msec. Severe aortic stenosis is present. Aortic valve mean gradient measures 39.0 mmHg. Aortic valve peak gradient measures 50.1 mmHg. Aortic valve area, by VTI measures 0.93 cm. Pulmonic Valve: The pulmonic valve was not well visualized. Pulmonic valve regurgitation is not visualized. No evidence of pulmonic stenosis. Aorta: The aortic root is normal in size and structure. Venous: The inferior vena cava is normal in size with greater than 50% respiratory variability, suggesting right atrial pressure of 3 mmHg. IAS/Shunts: No atrial level shunt detected by color flow Doppler.  LEFT VENTRICLE PLAX 2D LVIDd:         3.40 cm   Diastology LVIDs:         2.00 cm   LV e' medial:    3.86 cm/s LV PW:         1.40 cm   LV E/e' medial:  20.3 LV IVS:        1.30 cm   LV e' lateral:   5.55 cm/s LVOT diam:     2.00 cm   LV E/e' lateral: 14.1 LV SV:          73 LV SV Index:   41 LVOT Area:     3.14 cm  RIGHT VENTRICLE RV S prime:     8.16 cm/s TAPSE (M-mode): 2.0 cm LEFT ATRIUM             Index        RIGHT ATRIUM           Index LA diam:        3.40 cm 1.89 cm/m   RA Area:     14.50 cm LA Vol (A2C):  76.1 ml 42.29 ml/m  RA Volume:   30.40 ml  16.89 ml/m LA Vol (A4C):   45.6 ml 25.34 ml/m LA Biplane Vol: 58.4 ml 32.46 ml/m  AORTIC VALVE AV Area (Vmax):    0.90 cm AV Area (Vmean):   0.92 cm AV Area (VTI):     0.93 cm AV Vmax:           354.00 cm/s AV Vmean:          259.750 cm/s AV VTI:            0.786 m AV Peak Grad:      50.1 mmHg AV Mean Grad:      39.0 mmHg LVOT Vmax:         101.00 cm/s LVOT Vmean:        76.100 cm/s LVOT VTI:          0.232 m LVOT/AV VTI ratio: 0.30 AI PHT:            460 msec  AORTA Ao Root diam: 3.70 cm MITRAL VALVE MV Area (PHT): 2.20 cm     SHUNTS MV Peak grad:  11.6 mmHg    Systemic VTI:  0.23 m MV Mean grad:  3.0 mmHg     Systemic Diam: 2.00 cm MV Vmax:       1.70 m/s MV Vmean:      63.7 cm/s MV Decel Time: 345 msec MV E velocity: 78.50 cm/s MV A velocity: 170.00 cm/s MV E/A ratio:  0.46 Dina Rich MD Electronically signed by Dina Rich MD Signature Date/Time: 03/11/2023/5:03:24 PM    Final     Assessment/Plan  1. Compression fracture of L4 vertebra, sequela Closed burst fracture of lumbar vertebra, sequela -  will start on Cymbalta 30 mg daily -  decrease Tramadol 50 mg to Q 8 hours PRN -  continue Oxycodone PRN, Acetaminophen PRN and Methocarbamol PRN -  follow up with neurosurgery   2. Slow transit constipation -  change Miralax to PRN since BM being too often -   continue Docusate and Psyllium    Family/ staff Communication: Discussed plan of care with resident and charge nurse.  Labs/tests ordered:  None    Kenard Gower, DNP, MSN, FNP-BC [facility] [phone] (Monday-Friday 8:00 a.m. - 5:00 p.m.) [phone] (after hours)

## 2023-04-13 ENCOUNTER — Other Ambulatory Visit: Payer: Self-pay | Admitting: Student

## 2023-04-13 ENCOUNTER — Encounter: Payer: Self-pay | Admitting: Internal Medicine

## 2023-04-13 DIAGNOSIS — S32040S Wedge compression fracture of fourth lumbar vertebra, sequela: Secondary | ICD-10-CM

## 2023-04-13 MED ORDER — OXYCODONE HCL 5 MG PO TABS: 5.0000 mg | ORAL_TABLET | Freq: Four times a day (QID) | ORAL | 0 refills | Status: DC | PRN

## 2023-04-13 MED ORDER — ALPRAZOLAM 0.5 MG PO TABS: 0.5000 mg | ORAL_TABLET | Freq: Every evening | ORAL | 0 refills | Status: DC | PRN

## 2023-04-13 MED ORDER — TRAMADOL HCL 50 MG PO TABS: 50.0000 mg | ORAL_TABLET | Freq: Three times a day (TID) | ORAL | 0 refills | Status: DC | PRN

## 2023-04-13 NOTE — Progress Notes (Signed)
Admitted to snf, rx sent to pharmacy.

## 2023-04-14 ENCOUNTER — Ambulatory Visit (INDEPENDENT_AMBULATORY_CARE_PROVIDER_SITE_OTHER): Payer: Medicare Other | Admitting: Pulmonary Disease

## 2023-04-14 ENCOUNTER — Encounter: Payer: Self-pay | Admitting: Pulmonary Disease

## 2023-04-14 VITALS — BP 106/60 | HR 122 | Temp 97.6°F | Ht 66.0 in | Wt 150.0 lb

## 2023-04-14 DIAGNOSIS — R911 Solitary pulmonary nodule: Secondary | ICD-10-CM | POA: Insufficient documentation

## 2023-04-14 NOTE — Progress Notes (Signed)
Synopsis: Referred in by Benita Stabile, MD   Subjective:   PATIENT ID: Calvin Moreno GENDER: male DOB: 1940/12/16, MRN: [id number]  Chief Complaint  Patient presents with   Consult    No cough, shortness of breath or wheezing. Lung nodule on CT scan.     HPI Calvin Moreno is an 82 year old male patient with a past medical history of prostate cancer s/p XRT in 2012, hx of tobacco use, peripheral neuropathy on chronic hydrocodone, severe aortic stenosis ASA 0.93cm2 mean gradient  and recent fall c/b L4 compression fracture now in braces presenting today to the pulmonary clinic for further evaluation of lung nodule.   He suffered a fall early November thought to be secondary to over medicating with hydrocodone and subsequently found to have an L4 fracture. Currently in back braces and undergoing physical therapy. Remains extremely sedentary.   As part of the work up, he underwent a CT chest that showed a 2.4cm left upper lobe spiculated nodule highly suspicious for malignancy.    Family history: No family history of lung cancer  Social history: previous smoker quit in the 90s. Smoked 2 ppd for 30 years.   ROS All systems were reviewed and are negative except for the above. Objective:   Vitals:   04/14/23 1058  BP: 106/60  Pulse: (!) 122  Temp: 97.6 F (36.4 C)  TempSrc: Temporal  SpO2: 98%  Weight: 150 lb (68 kg)  Height: 5\' 6"  (1.676 m)   98% on RA BMI Readings from Last 3 Encounters:  04/14/23 24.21 kg/m  04/09/23 23.24 kg/m  04/03/23 22.95 kg/m   Wt Readings from Last 3 Encounters:  04/14/23 150 lb (68 kg)  04/09/23 144 lb (65.3 kg)  04/03/23 142 lb 3.2 oz (64.5 kg)    Physical Exam GEN: NAD, in braces and noticeable pain HEENT: Supple Neck, Reactive Pupils, EOMI  CVS: Normal S1, faint S2, systolic murmur heard best at the right sternal border  Lungs: Clear bilateral air entry.  Abdomen: Soft, non tender, non distended, + BS  Extremities: Warm and  well perfused, No edema   Labs and imaging were reviewed.   Ancillary Information   CBC    Component Value Date/Time   WBC 6.4 04/06/2023 0000   WBC 9.9 04/01/2023 0419   RBC 3.48 (A) 04/06/2023 0000   HGB 10.5 (A) 04/06/2023 0000   HCT 33 (A) 04/06/2023 0000   PLT 331 04/06/2023 0000   MCV 91.1 04/01/2023 0419   MCH 30.4 04/01/2023 0419   MCHC 33.3 04/01/2023 0419   RDW 13.0 04/01/2023 0419   LYMPHSABS 1.0 03/30/2023 1817   MONOABS 0.7 03/30/2023 1817   EOSABS 0.2 03/30/2023 1817   BASOSABS 0.0 03/30/2023 1817        No data to display           Assessment & Plan:  [name] is an 82 year old male patient with a past medical history of prostate cancer s/p XRT in 2012, hx of tobacco use, peripheral neuropathy on chronic hydrocodone, severe aortic stenosis ASA 0.93cm2 mean gradient  and recent fall c/b L4 compression fracture now in braces presenting today to the pulmonary clinic for further evaluation of lung nodule.   #LUL 2.4cm nodule  Highly suspicous for malignancy. Hx of smoking, hx of prostate cancer. Location in the upper lobe and spiculation.   This warrants tissue biopsy however given his current state and severe aortic stenosis, I doubt he will be a good  candidate for [employer] bronchoscopy. Discussed with family other options including transthoracic biopsy which they were open to. Will obtain a PET scan and a CT chest Super D and discuss optimal approach for diagnosis. He is seeing cardiology early December.   []  PET scan and CT chest SuperD   Return in about 3 months (around 07/15/2023).  I spent 60 minutes caring for this patient today, including preparing to see the patient, obtaining a medical history , reviewing a separately obtained history, performing a medically appropriate examination and/or evaluation, counseling and educating the patient/family/caregiver, referring and communicating with other health care professionals  (not separately reported), documenting clinical information in the electronic health record, and independently interpreting results (not separately reported/billed) and communicating results to the patient/family/caregiver  Janann Colonel, MD [facility] 04/14/2023 2:50 PM

## 2023-04-17 ENCOUNTER — Non-Acute Institutional Stay (SKILLED_NURSING_FACILITY): Payer: Medicare Other | Admitting: Student

## 2023-04-17 ENCOUNTER — Encounter: Payer: Self-pay | Admitting: Student

## 2023-04-17 DIAGNOSIS — I739 Peripheral vascular disease, unspecified: Secondary | ICD-10-CM | POA: Diagnosis not present

## 2023-04-17 DIAGNOSIS — S32040S Wedge compression fracture of fourth lumbar vertebra, sequela: Secondary | ICD-10-CM | POA: Diagnosis not present

## 2023-04-17 DIAGNOSIS — K922 Gastrointestinal hemorrhage, unspecified: Secondary | ICD-10-CM

## 2023-04-17 DIAGNOSIS — Z8546 Personal history of malignant neoplasm of prostate: Secondary | ICD-10-CM

## 2023-04-17 DIAGNOSIS — K5901 Slow transit constipation: Secondary | ICD-10-CM

## 2023-04-17 DIAGNOSIS — Z8673 Personal history of transient ischemic attack (TIA), and cerebral infarction without residual deficits: Secondary | ICD-10-CM

## 2023-04-17 NOTE — Progress Notes (Unsigned)
Provider:  Coralyn Helling, M.D. Location:  [facility] Room Number: 108 A Place of Service:  SNF ([phone])  PCP: Benita Stabile, MD Patient Care Team: Benita Stabile, MD as PCP - General (Internal Medicine) Wyline Mood, Dorothe Pea, MD as PCP - Cardiology (Cardiology) Jena Gauss Gerrit Friends, MD (Gastroenterology)  Extended Emergency Contact Information Primary Emergency Contact: [facility] Address: [address]          , [place] [address] [place] of [place] Home Phone: [phone] Relation: Spouse Secondary Emergency Contact: [place], [place] Mobile Phone: [phone] Relation: Son  Code Status: Full Code  Goals of Care: Advanced Directive information    04/09/2023   10:57 AM  Advanced Directives  Does Patient Have a Medical Advance Directive? No  Does patient want to make changes to medical advance directive? No - Patient declined      Chief Complaint  Patient presents with   New Admit To SNF    New admission to [facility]     HPI: Patient is a 82 y.o. male seen today for admission to He was admitted after a fall. Had an L4 burst fracture. He has had a hard time. His   He had some previous falls and was taking hydrocodone for pain in his hip. He came in that afternooon and didn't feel well, took a medication and sat in his recliner.He has neuropathy and htinks It contributed to his fall as well as the pain medication. He's knees "gave way."He uses a walker at home, but wasn't using it. His wife is at home with him and she also has neuropathy. He has had 3 falls this year. He drives. His wife helps with medications.   Hospitalization complicatd by a GI bleed. Has continued pain after he eats. It's a sharp pains. He is having bowel movements regularly. He denies further blood in his stool. Denies nausea or vomiting. Pain is making hi mfeel "sick on his stomach.   He is from [place], [place]. He is retired [employer].   Past Medical History:   Diagnosis Date   Aortic stenosis, mod 2021   And insufficiency, evaluation by Dr. Daleen Squibb in 2000   Arthritis    Benign prostatic hypertrophy    PSA of 1.39 in 09/2010   Chronic kidney disease    Borderline; creatinine of 1.6 in 08/2009, but 1.15 in 05/2010   Chronic sinusitis    By MRI   Diverticulosis 2013   GERD (gastroesophageal reflux disease)    Hearing impaired    Hemorrhoids 2006   Hiatal hernia 2006   HOH (hard of hearing)    Hyperlipidemia    Elevated triglycerides   Hypertension    Mild internal carotid artery plaque on MRI/MRA in 2002   Hypothyroidism    IBS (irritable bowel syndrome)    Insomnia    Left bundle branch block 2012   2012   Metabolic syndrome    Fasting hyperglycemia   Murmur    Nephrolithiasis 08/2009   2011   Obesity    Proctitis    radiation induced   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   s/p XRT   Seasonal allergies    Stroke (HCC)    TIA, no residual, seen on MRI   Tobacco abuse, in remission    60 pack years; quit in 1999   Uses hearing aid    bilateral   Past Surgical History:  Procedure Laterality Date   ANKLE FRACTURE SURGERY  left   BIOPSY  12/31/2022   Procedure: BIOPSY;  Surgeon: Corbin Ade, MD;  [facility];  Service: Endoscopy;;   CATARACT EXTRACTION Left    CATARACT EXTRACTION W/PHACO Right 12/19/2013   Procedure: CATARACT EXTRACTION PHACO AND INTRAOCULAR LENS PLACEMENT RIGHT EYE CDE=14.78;  Surgeon: Gemma Payor, MD;  Location: [facility];  Service: Ophthalmology;  Laterality: Right;   CHOLECYSTECTOMY     COLONOSCOPY  2006   Dr. Henry Russel mucosa throughout colon, moderate internal hemorrhoids. bx= benign colonic mucosa   COLONOSCOPY  12/03/2011   Dr. Jena Gauss- colonic diverticulosis, radiation-induced proctitis- s/p APC ablation, no microscopic colitis on bx   COLONOSCOPY WITH PROPOFOL N/A 06/12/2020   Procedure: COLONOSCOPY WITH PROPOFOL;  Surgeon: Corbin Ade, MD;  [facility]: [facility];  Service: Endoscopy;   Laterality: N/A;  2:45pm   ESOPHAGOGASTRODUODENOSCOPY  2006   Dr. Randa Evens- small hiatal hernia and slightly reddened distal esophagus o/w normal   ESOPHAGOGASTRODUODENOSCOPY (EGD) WITH PROPOFOL N/A 12/31/2022   Procedure: ESOPHAGOGASTRODUODENOSCOPY (EGD) WITH PROPOFOL;  Surgeon: Corbin Ade, MD;  [facility];  Service: Endoscopy;  Laterality: N/A;  1245pm, asa 3   FLEXIBLE SIGMOIDOSCOPY N/A 07/13/2013   ONG:[id number] changes of the rectum with active oozing consistent with chronic radiation proctitis.  Otherwise, negative sigmoidoscopy to 40 cm.Status post argon plasma coagulation ablation    FLEXIBLE SIGMOIDOSCOPY N/A 04/02/2023   Procedure: FLEXIBLE SIGMOIDOSCOPY;  Surgeon: Franky Macho, MD;  [facility];  Service: Endoscopy;  Laterality: N/A;   HOT HEMOSTASIS N/A 07/13/2013   Procedure: HOT HEMOSTASIS (ARGON PLASMA COAGULATION/BICAP);  Surgeon: Corbin Ade, MD;  [facility];  Service: Endoscopy;  Laterality: N/A;   HOT HEMOSTASIS  04/02/2023   Procedure: HOT HEMOSTASIS (ARGON PLASMA COAGULATION/BICAP);  Surgeon: Franky Macho, MD;  [facility];  Service: Endoscopy;;   Ileocolonoscopy  12/03/2011   WNU:UVOZDGU diverticulosis. Radiation-induced proctitis status post APC ablation. Status post segmental colon Biopsy   MALONEY DILATION N/A 12/31/2022   Procedure: MALONEY DILATION;  Surgeon: Corbin Ade, MD;  [facility];  Service: Endoscopy;  Laterality: N/A;   TONSILLECTOMY      reports that he quit smoking about 25 years ago. His smoking use included cigarettes. He started smoking about 65 years ago. He has a 60 pack-year smoking history. He has never been exposed to tobacco smoke. He has never used smokeless tobacco. He reports that he does not drink alcohol and does not use drugs. Social History   Socioeconomic History   Marital status: Married    Spouse name: Not on file   Number of children: 2   Years of  education: Not on file   Highest education level: Not on file  Occupational History   Occupation: Retired    Associate Professor: [employer]  Tobacco Use   Smoking status: Former    Current packs/day: 0.00    Average packs/day: 1.5 packs/day for 40.0 years (60.0 ttl pk-yrs)    Types: Cigarettes    Start date: 11/04/1957    Quit date: 11/04/1997    Years since quitting: 25.4    Passive exposure: Never   Smokeless tobacco: Never   Tobacco comments:    Quit in 1999  Vaping Use   Vaping status: Never Used  Substance and Sexual Activity   Alcohol use: No    Alcohol/week: 0.0 standard drinks of alcohol   Drug use: No   Sexual activity: Yes  Other Topics Concern  Not on file  Social History Narrative   Married with 2 children   Lives locally   No regular exercise   Social Determinants of Health   Financial Resource Strain: Not on file  Food Insecurity: No Food Insecurity (03/30/2023)   Hunger Vital Sign    Worried About Running Out of Food in the Last Year: Never true    Ran Out of Food in the Last Year: Never true  Transportation Needs: No Transportation Needs (03/30/2023)   PRAPARE - Administrator, Civil Service (Medical): No    Lack of Transportation (Non-Medical): No  Physical Activity: Not on file  Stress: Not on file  Social Connections: Not on file  Intimate Partner Violence: Not At Risk (03/30/2023)   Humiliation, Afraid, Rape, and Kick questionnaire    Fear of Current or Ex-Partner: No    Emotionally Abused: No    Physically Abused: No    Sexually Abused: No    Functional Status Survey:    Family History  Problem Relation Age of Onset   Heart failure Mother    Heart attack Father    Heart failure Brother        bladder cancer (agent orange exposure)   Cirrhosis Brother        chronic viral hepatitis   Colon cancer Neg Hx     Health Maintenance  Topic Date Due   Medicare Annual Wellness (AWV)  Never done   DTaP/Tdap/Td (1 - Tdap) Never done    Pneumonia Vaccine 6+ Years old (1 of 1 - PCV) Never done   INFLUENZA VACCINE  12/25/2022   COVID-19 Vaccine (7 - 2023-24 season) 06/21/2023   Zoster Vaccines- Shingrix  Completed   HPV VACCINES  Aged Out    Allergies  Allergen Reactions   Norvasc [Amlodipine Besylate] Swelling    Ankle swelling   Codeine Nausea And Vomiting   Amoxicillin Rash    Outpatient Encounter Medications as of 04/17/2023  Medication Sig   acetaminophen (TYLENOL) 500 MG tablet Take 2 tablets (1,000 mg total) by mouth every 8 (eight) hours as needed for mild pain (pain score 1-3).   acyclovir (ZOVIRAX) 800 MG tablet Take 800 mg by mouth every 8 (eight) hours as needed. Give one tablet by mouth once daily.   ALPRAZolam (XANAX) 0.5 MG tablet Take 1 tablet (0.5 mg total) by mouth at bedtime as needed for sleep.   alum & mag hydroxide-simeth (MAALOX PLUS) 400-400-40 MG/5ML suspension Take 5 mLs by mouth every 4 (four) hours as needed for indigestion.   Ascorbic Acid (VITAMIN C) 1000 MG tablet Take 1,000 mg by mouth daily.   docusate sodium (COLACE) 100 MG capsule Take 1 capsule (100 mg total) by mouth 2 (two) times daily.   DULoxetine (CYMBALTA) 30 MG capsule Take 30 mg by mouth daily.   finasteride (PROSCAR) 5 MG tablet Take 1 tablet (5 mg total) by mouth daily.   fluticasone (CUTIVATE) 0.05 % cream SMARTSIG:Topical 1-2 Times Daily PRN   furosemide (LASIX) 40 MG tablet Take 40 mg by mouth daily.   levothyroxine (SYNTHROID) 25 MCG tablet Take 25 mcg by mouth daily.   meclizine (ANTIVERT) 12.5 MG tablet Take 1 tablet (12.5 mg total) by mouth 3 (three) times daily as needed for dizziness.   Melatonin 3 MG TABS Take 3 mg by mouth at bedtime. For sleep   methocarbamol (ROBAXIN) 500 MG tablet Take 1 tablet (500 mg total) by mouth every 8 (eight) hours as needed for  muscle spasms.   oxyCODONE (OXY IR/ROXICODONE) 5 MG immediate release tablet Take 1 tablet (5 mg total) by mouth every 6 (six) hours as needed for severe  pain (pain score 7-10).   pantoprazole (PROTONIX) 40 MG tablet Take 1 tablet (40 mg total) by mouth daily before breakfast.   Polyethyl Glycol-Propyl Glycol (SYSTANE OP) Place 1 drop into both eyes 2 (two) times daily as needed (dry eyes).   polyethylene glycol (MIRALAX / GLYCOLAX) 17 g packet Take 17 g by mouth daily. Can be spread to every other day in the setting of diarrhea.   potassium chloride (KLOR-CON M) 10 MEQ tablet Take 1 tablet (10 mEq total) by mouth daily.   psyllium (HYDROCIL/METAMUCIL) 95 % PACK Take 1 packet by mouth 2 (two) times daily.   silodosin (RAPAFLO) 8 MG CAPS capsule Take 1 capsule (8 mg total) by mouth 2 (two) times daily.   simvastatin (ZOCOR) 20 MG tablet Take 20 mg by mouth every evening.   sucralfate (CARAFATE) 1 GM/10ML suspension Provide 1 gram enema twice a day.   traMADol (ULTRAM) 50 MG tablet Take 1 tablet (50 mg total) by mouth every 8 (eight) hours as needed for moderate pain (pain score 4-6).   vitamin B-12 (CYANOCOBALAMIN) 1000 MCG tablet Take 2,000 mcg by mouth daily.   Zinc Oxide (TRIPLE PASTE) 12.8 % ointment Apply 1 Application topically. Every shift.   No facility-administered encounter medications on file as of 04/17/2023.    Review of Systems  Vitals:   04/17/23 1140  BP: 125/80  Pulse: 95  Temp: 97.8 F (36.6 C)  SpO2: 97%  Weight: 145 lb 9.6 oz (66 kg)  Height: 5\' 6"  (1.676 m)   Body mass index is 23.5 kg/m. Physical Exam  Labs reviewed: Basic Metabolic Panel: Recent Labs    12/26/22 1232 12/26/22 1803 12/27/22 0359 12/27/22 0959 03/30/23 1817 03/31/23 0423 04/01/23 0505 04/06/23 0000  NA 135   < > 139   < > 138 135 136 137  K 2.1*   < > 2.9*   < > 3.0* 2.9* 4.1 4.2  CL 88*   < > 98   < > 101 104 105 102  CO2 36*   < > 35*   < > 25 24 26  30*  GLUCOSE 151*   < > 137*   < > 131* 129* 95  --   BUN 19   < > 11   < > 18 18 12 8   CREATININE 1.33*   < > 1.01   < > 0.93 1.03 0.88 0.9  CALCIUM 8.5*   < > 8.3*   < > 8.3* 7.9*  8.1* 8.5*  MG 1.8  --  2.1  --   --  2.1  --   --   PHOS  --   --   --   --   --  3.1  --   --    < > = values in this interval not displayed.   Liver Function Tests: Recent Labs    03/30/23 1817 03/31/23 0423 04/01/23 0505  AST 24 19 16   ALT 25 22 19   ALKPHOS 132* 113 107  BILITOT 0.9 0.8 0.9  PROT 5.9* 5.2* 5.1*  ALBUMIN 2.6* 2.3* 2.3*   No results for input(s): "LIPASE", "AMYLASE" in the last 8760 hours. No results for input(s): "AMMONIA" in the last 8760 hours. CBC: Recent Labs    12/26/22 1232 12/26/22 2032 12/27/22 0359 12/31/22 1132 03/30/23 1817 03/31/23 0423  03/31/23 1323 04/01/23 0419 04/06/23 0000  WBC 9.2  --  11.9*  --  14.8* 12.4*  --  9.9 6.4  NEUTROABS 6.9  --  9.6*  --  12.8*  --   --   --   --   HGB 13.5   < > 12.6*   < > 13.5 11.7* 12.3* 10.2* 10.5*  HCT 40.4   < > 38.2*   < > 40.1 35.2* 37.7* 30.6* 33*  MCV 91.8  --  92.5  --  89.1 89.6  --  91.1  --   PLT 289  --  259  --  369 332  --  305 331   < > = values in this interval not displayed.   Cardiac Enzymes: No results for input(s): "CKTOTAL", "CKMB", "CKMBINDEX", "TROPONINI" in the last 8760 hours. BNP: Invalid input(s): "POCBNP" Lab Results  Component Value Date   HGBA1C 5.9 06/06/2010   Lab Results  Component Value Date   TSH 3.627 03/31/2023   No results found for: "VITAMINB12" No results found for: "FOLATE" No results found for: "IRON", "TIBC", "FERRITIN"  Imaging and Procedures obtained prior to SNF admission: MR LUMBAR SPINE WO CONTRAST  Result Date: 04/08/2023 CLINICAL DATA:  Burst fracture of L4 on recent CT EXAM: MRI LUMBAR SPINE WITHOUT CONTRAST TECHNIQUE: Multiplanar, multisequence MR imaging of the lumbar spine was performed. No intravenous contrast was administered. COMPARISON:  08/21/2021 MRI lumbar spine, correlation is made with 03/24/2023 CT lumbar spine FINDINGS: Segmentation:  5 lumbar type vertebral bodies. Alignment: Overall dextrocurvature of the lumbar spine. No  significant listhesis. Vertebrae: Redemonstrated compression fracture of L4 lower, with increased T2 signal throughout the vertebral body as well as a fluid-filled cleft. Up to 75% vertebral body height loss centrally, previously 60% on 03/24/2023. 6 mm retropulsion of the posterior cortex. Increased T2 signal extends into the bilateral pedicles. Otherwise normal marrow signal. No evidence of additional acute fracture, suspicious osseous lesion, or discitis. Conus medullaris and cauda equina: Conus extends to the L1 level. Conus and cauda equina appear normal. Paraspinal and other soft tissues: Large right renal cyst for which no follow-up is currently indicated. Increased T2 signal between the spinous processes of L4 and L5. Disc levels: T12-L1: No significant disc bulge. No spinal canal stenosis or neural foraminal narrowing. L1-L2: Minimal disc bulge. No spinal canal stenosis or neural foraminal narrowing. L2-L3: Mild disc bulge with right foraminal protrusion. No spinal canal stenosis. Mild right neural foraminal narrowing. L3-L4: Mild disc bulge with central annular fissure. Mild facet arthropathy. Ligamentum flavum hypertrophy. At the disc level, there is moderate spinal canal stenosis, with moderate to severe spinal canal stenosis just inferior to this, the site of the L4 vertebral body retropulsion (series 12, image 12). Effacement of the lateral recesses. Moderate bilateral neural foraminal narrowing. L4-L5: Minimal disc bulge with left foraminal protrusion. Mild facet arthropathy, with redemonstrated left facet synovial cyst. Ligamentum flavum hypertrophy. Narrowing of the lateral recesses. Mild spinal canal stenosis. Moderate left and severe right neural foraminal narrowing. L5-S1: No significant disc bulge. No spinal canal stenosis or neural foraminal narrowing. IMPRESSION: 1. Acute to subacute compression fracture of L4, with up to 75% vertebral body height loss centrally, previously 60% on 03/24/2023.  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 2. L3-L4 moderate spinal canal stenosis and moderate bilateral neural  foraminal narrowing. 3. L4-L5 mild spinal canal stenosis with moderate left and severe right neural foraminal narrowing. 4. L2-L3 mild right neural foraminal narrowing. 5. Increased T2 signal between the spinous processes of L4 and L5, which can be posttraumatic or seen in the setting of Baastrup's disease. Electronically Signed   By: Wiliam Ke M.D.   On: 04/08/2023 16:44    Assessment/Plan There are no diagnoses linked to this encounter.   Family/ staff Communication:   Labs/tests ordered:

## 2023-04-19 ENCOUNTER — Encounter: Payer: Self-pay | Admitting: Student

## 2023-04-19 DIAGNOSIS — I739 Peripheral vascular disease, unspecified: Secondary | ICD-10-CM | POA: Insufficient documentation

## 2023-04-22 ENCOUNTER — Non-Acute Institutional Stay (SKILLED_NURSING_FACILITY): Payer: Medicare Other | Admitting: Student

## 2023-04-22 ENCOUNTER — Encounter: Payer: Self-pay | Admitting: Student

## 2023-04-22 DIAGNOSIS — G629 Polyneuropathy, unspecified: Secondary | ICD-10-CM | POA: Diagnosis not present

## 2023-04-22 DIAGNOSIS — I739 Peripheral vascular disease, unspecified: Secondary | ICD-10-CM

## 2023-04-22 MED ORDER — PREGABALIN 100 MG PO CAPS: 100.0000 mg | ORAL_CAPSULE | Freq: Three times a day (TID) | ORAL | 0 refills | Status: DC

## 2023-04-22 NOTE — Progress Notes (Signed)
Location:  [facility] Room Number: 108A Place of Service:  SNF [id number]) Provider:  Korbin Mapps,MD  Benita Stabile, MD  Patient Care Team: Benita Stabile, MD as PCP - General (Internal Medicine) Wyline Mood Dorothe Pea, MD as PCP - Cardiology (Cardiology) Jena Gauss Gerrit Friends, MD (Gastroenterology)  Extended Emergency Contact Information Primary Emergency Contact: [facility] Address: [address]          [place], [place] [address] [place] of [place] Home Phone: [phone] Relation: Spouse Secondary Emergency Contact: [place], [place] Mobile Phone: [phone] Relation: Son  Code Status:  FULL Goals of care: Advanced Directive information    04/22/2023   10:45 AM  Advanced Directives  Does Patient Have a Medical Advance Directive? No  Does patient want to make changes to medical advance directive? No - Patient declined  Would patient like information on creating a medical advance directive? No - Patient declined     Chief Complaint  Patient presents with   Acute Visit    Pain control    HPI:  Pt is a 82 y.o. male seen today for an acute visit for pain management. Patient with continued neuropathic pain. Discussed concern the medication he requested is not indicated for a patient with his pain concerns. Discussed the closer interval for tramadol and increasing lyrica to 100 mg TID. Discussed possible sedation with these higher doses of medication, however, this is aligned with his current GOC.    Past Medical History:  Diagnosis Date   Aortic stenosis, mod 2021   And insufficiency, evaluation by Dr. Daleen Squibb in 2000   Arthritis    Benign prostatic hypertrophy    PSA of 1.39 in 09/2010   Chronic kidney disease    Borderline; creatinine of 1.6 in 08/2009, but 1.15 in 05/2010   Chronic sinusitis    By MRI   Diverticulosis 2013   GERD (gastroesophageal reflux disease)    Hearing impaired    Hemorrhoids 2006   Hiatal hernia 2006   HOH (hard of hearing)     Hyperlipidemia    Elevated triglycerides   Hypertension    Mild internal carotid artery plaque on MRI/MRA in 2002   Hypothyroidism    IBS (irritable bowel syndrome)    Insomnia    Left bundle branch block 2012   2012   Metabolic syndrome    Fasting hyperglycemia   Murmur    Nephrolithiasis 08/2009   2011   Obesity    Proctitis    radiation induced   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   s/p XRT   Seasonal allergies    Stroke (HCC)    TIA, no residual, seen on MRI   Tobacco abuse, in remission    60 pack years; quit in 1999   Uses hearing aid    bilateral   Past Surgical History:  Procedure Laterality Date   ANKLE FRACTURE SURGERY     left   BIOPSY  12/31/2022   Procedure: BIOPSY;  Surgeon: Corbin Ade, MD;  [facility]: [facility];  Service: Endoscopy;;   CATARACT EXTRACTION Left    CATARACT EXTRACTION W/PHACO Right 12/19/2013   Procedure: CATARACT EXTRACTION PHACO AND INTRAOCULAR LENS PLACEMENT RIGHT EYE CDE=14.78;  Surgeon: Gemma Payor, MD;  Location: [facility];  Service: Ophthalmology;  Laterality: Right;   CHOLECYSTECTOMY     COLONOSCOPY  2006   Dr. Henry Russel mucosa throughout colon, moderate internal hemorrhoids. bx= benign colonic mucosa   COLONOSCOPY  12/03/2011   Dr. Jena Gauss- colonic diverticulosis, radiation-induced  proctitis- s/p APC ablation, no microscopic colitis on bx   COLONOSCOPY WITH PROPOFOL N/A 06/12/2020   Procedure: COLONOSCOPY WITH PROPOFOL;  Surgeon: Corbin Ade, MD;  [facility]: [facility];  Service: Endoscopy;  Laterality: N/A;  2:45pm   ESOPHAGOGASTRODUODENOSCOPY  2006   Dr. Randa Evens- small hiatal hernia and slightly reddened distal esophagus o/w normal   ESOPHAGOGASTRODUODENOSCOPY (EGD) WITH PROPOFOL N/A 12/31/2022   Procedure: ESOPHAGOGASTRODUODENOSCOPY (EGD) WITH PROPOFOL;  Surgeon: Corbin Ade, MD;  [facility];  Service: Endoscopy;  Laterality: N/A;  1245pm, asa 3   FLEXIBLE SIGMOIDOSCOPY N/A 07/13/2013   VZD:GLOVFIEPPIR  changes of the rectum with active oozing consistent with chronic radiation proctitis.  Otherwise, negative sigmoidoscopy to 40 cm.Status post argon plasma coagulation ablation    FLEXIBLE SIGMOIDOSCOPY N/A 04/02/2023   Procedure: FLEXIBLE SIGMOIDOSCOPY;  Surgeon: Franky Macho, MD;  [facility];  Service: Endoscopy;  Laterality: N/A;   HOT HEMOSTASIS N/A 07/13/2013   Procedure: HOT HEMOSTASIS (ARGON PLASMA COAGULATION/BICAP);  Surgeon: Corbin Ade, MD;  [facility];  Service: Endoscopy;  Laterality: N/A;   HOT HEMOSTASIS  04/02/2023   Procedure: HOT HEMOSTASIS (ARGON PLASMA COAGULATION/BICAP);  Surgeon: Franky Macho, MD;  [facility];  Service: Endoscopy;;   Ileocolonoscopy  12/03/2011   JJO:ACZYSAY diverticulosis. Radiation-induced proctitis status post APC ablation. Status post segmental colon Biopsy   MALONEY DILATION N/A 12/31/2022   Procedure: MALONEY DILATION;  Surgeon: Corbin Ade, MD;  [facility];  Service: Endoscopy;  Laterality: N/A;   TONSILLECTOMY      Allergies  Allergen Reactions   Norvasc [Amlodipine Besylate] Swelling    Ankle swelling   Codeine Nausea And Vomiting   Amoxicillin Rash    Outpatient Encounter Medications as of 04/22/2023  Medication Sig   acetaminophen (TYLENOL) 500 MG tablet Take 2 tablets (1,000 mg total) by mouth every 8 (eight) hours as needed for mild pain (pain score 1-3).   acyclovir (ZOVIRAX) 800 MG tablet Take 800 mg by mouth every 8 (eight) hours as needed. And give one tablet by mouth once daily.   ALPRAZolam (XANAX) 0.5 MG tablet Take 1 tablet (0.5 mg total) by mouth at bedtime as needed for sleep.   alum & mag hydroxide-simeth (MAALOX PLUS) 400-400-40 MG/5ML suspension Take 5 mLs by mouth every 4 (four) hours as needed for indigestion.   Ascorbic Acid (VITAMIN C) 1000 MG tablet Take 1,000 mg by mouth daily.   docusate sodium (COLACE) 100 MG capsule Take 1 capsule (100 mg total) by  mouth 2 (two) times daily.   DULoxetine (CYMBALTA) 30 MG capsule Take 30 mg by mouth daily.   finasteride (PROSCAR) 5 MG tablet Take 1 tablet (5 mg total) by mouth daily.   fluticasone (CUTIVATE) 0.05 % cream SMARTSIG:Topical 1-2 Times Daily PRN   furosemide (LASIX) 40 MG tablet Take 40 mg by mouth daily.   levothyroxine (SYNTHROID) 25 MCG tablet Take 25 mcg by mouth daily.   meclizine (ANTIVERT) 12.5 MG tablet Take 1 tablet (12.5 mg total) by mouth 3 (three) times daily as needed for dizziness.   melatonin 5 MG TABS Take 5 mg by mouth at bedtime. Both 3 and 5 mg is listed at [facility]   methocarbamol (ROBAXIN) 500 MG tablet Take 1 tablet (500 mg total) by mouth every 8 (eight) hours as needed for muscle spasms.   oxyCODONE (OXY IR/ROXICODONE) 5 MG immediate release tablet Take 1 tablet (5 mg total) by mouth  every 6 (six) hours as needed for severe pain (pain score 7-10).   pantoprazole (PROTONIX) 40 MG tablet Take 1 tablet (40 mg total) by mouth daily before breakfast.   Polyethyl Glycol-Propyl Glycol (SYSTANE OP) Place 1 drop into both eyes 2 (two) times daily as needed (dry eyes).   polyethylene glycol (MIRALAX / GLYCOLAX) 17 g packet Take 17 g by mouth daily. Can be spread to every other day in the setting of diarrhea.   potassium chloride (KLOR-CON M) 10 MEQ tablet Take 1 tablet (10 mEq total) by mouth daily.   psyllium (HYDROCIL/METAMUCIL) 95 % PACK Take 1 packet by mouth 2 (two) times daily.   silodosin (RAPAFLO) 8 MG CAPS capsule Take 1 capsule (8 mg total) by mouth 2 (two) times daily.   simvastatin (ZOCOR) 20 MG tablet Take 20 mg by mouth every evening.   sucralfate (CARAFATE) 1 GM/10ML suspension 20 ml by mouth before mesals for GERD   traMADol (ULTRAM) 50 MG tablet Take 1 tablet (50 mg total) by mouth every 8 (eight) hours as needed for moderate pain (pain score 4-6).   vitamin B-12 (CYANOCOBALAMIN) 1000 MCG tablet Take 2,000 mcg by mouth daily.   Zinc Oxide (TRIPLE PASTE) 12.8 %  ointment Apply 1 Application topically. Every shift.   Melatonin 3 MG TABS Take 3 mg by mouth at bedtime. For sleep (Patient not taking: Reported on 04/22/2023)   sucralfate (CARAFATE) 1 GM/10ML suspension Provide 1 gram enema twice a day. (Patient not taking: Reported on 04/22/2023)   No facility-administered encounter medications on file as of 04/22/2023.    Review of Systems  Immunization History  Administered Date(s) Administered   Fluad Quad(high Dose 65+) 02/08/2019   Influenza, High Dose Seasonal PF 02/23/2018   PFIZER(Purple Top)SARS-COV-2 Vaccination 06/30/2019, 07/25/2019, 03/10/2020, 02/15/2021, 04/04/2022   Pfizer(Comirnaty)Fall Seasonal Vaccine 12 years and older 02/19/2023   Zoster Recombinant(Shingrix) 02/01/2018, 08/05/2018   Pertinent  Health Maintenance Due  Topic Date Due   INFLUENZA VACCINE  12/25/2022      06/17/2018    6:19 AM 06/12/2020    9:49 AM 08/10/2021    1:25 PM  Fall Risk  (RETIRED) Patient Fall Risk Level Low fall risk Moderate fall risk Low fall risk   Functional Status Survey:    Vitals:   04/22/23 1043  BP: 116/72  Pulse: (!) 108  Resp: 18  Temp: (!) 97.2 F (36.2 C)  SpO2: 95%  Weight: 141 lb (64 kg)  Height: 5\' 6"  (1.676 m)   Body mass index is 22.76 kg/m. Physical Exam Constitutional:      Appearance: Normal appearance.  Neurological:     Mental Status: He is alert and oriented to person, place, and time.     Labs reviewed: Recent Labs    12/26/22 1232 12/26/22 1803 12/27/22 0359 12/27/22 0959 03/30/23 1817 03/31/23 0423 04/01/23 0505 04/06/23 0000  NA 135   < > 139   < > 138 135 136 137  K 2.1*   < > 2.9*   < > 3.0* 2.9* 4.1 4.2  CL 88*   < > 98   < > 101 104 105 102  CO2 36*   < > 35*   < > 25 24 26  30*  GLUCOSE 151*   < > 137*   < > 131* 129* 95  --   BUN 19   < > 11   < > 18 18 12 8   CREATININE 1.33*   < > 1.01   < >  0.93 1.03 0.88 0.9  CALCIUM 8.5*   < > 8.3*   < > 8.3* 7.9* 8.1* 8.5*  MG 1.8  --  2.1  --    --  2.1  --   --   PHOS  --   --   --   --   --  3.1  --   --    < > = values in this interval not displayed.   Recent Labs    03/30/23 1817 03/31/23 0423 04/01/23 0505  AST 24 19 16   ALT 25 22 19   ALKPHOS 132* 113 107  BILITOT 0.9 0.8 0.9  PROT 5.9* 5.2* 5.1*  ALBUMIN 2.6* 2.3* 2.3*   Recent Labs    12/26/22 1232 12/26/22 2032 12/27/22 0359 12/31/22 1132 03/30/23 1817 03/31/23 0423 03/31/23 1323 04/01/23 0419 04/06/23 0000  WBC 9.2  --  11.9*  --  14.8* 12.4*  --  9.9 6.4  NEUTROABS 6.9  --  9.6*  --  12.8*  --   --   --   --   HGB 13.5   < > 12.6*   < > 13.5 11.7* 12.3* 10.2* 10.5*  HCT 40.4   < > 38.2*   < > 40.1 35.2* 37.7* 30.6* 33*  MCV 91.8  --  92.5  --  89.1 89.6  --  91.1  --   PLT 289  --  259  --  369 332  --  305 331   < > = values in this interval not displayed.   Lab Results  Component Value Date   TSH 3.627 03/31/2023   Lab Results  Component Value Date   HGBA1C 5.9 06/06/2010   Lab Results  Component Value Date   CHOL 170 06/06/2010   HDL 34 06/06/2010   LDLCALC 101 06/06/2010   TRIG 177 06/06/2010    Significant Diagnostic Results in last 30 days:  MR LUMBAR SPINE WO CONTRAST  Result Date: 04/08/2023 CLINICAL DATA:  Burst fracture of L4 on recent CT EXAM: MRI LUMBAR SPINE WITHOUT CONTRAST TECHNIQUE: Multiplanar, multisequence MR imaging of the lumbar spine was performed. No intravenous contrast was administered. COMPARISON:  08/21/2021 MRI lumbar spine, correlation is made with 03/24/2023 CT lumbar spine FINDINGS: Segmentation:  5 lumbar type vertebral bodies. Alignment: Overall dextrocurvature of the lumbar spine. No significant listhesis. Vertebrae: Redemonstrated compression fracture of L4 lower, with increased T2 signal throughout the vertebral body as well as a fluid-filled cleft. Up to 75% vertebral body height loss centrally, previously 60% on 03/24/2023. 6 mm retropulsion of the posterior cortex. Increased T2 signal extends into the  bilateral pedicles. Otherwise normal marrow signal. No evidence of additional acute fracture, suspicious osseous lesion, or discitis. Conus medullaris and cauda equina: Conus extends to the L1 level. Conus and cauda equina appear normal. Paraspinal and other soft tissues: Large right renal cyst for which no follow-up is currently indicated. Increased T2 signal between the spinous processes of L4 and L5. Disc levels: T12-L1: No significant disc bulge. No spinal canal stenosis or neural foraminal narrowing. L1-L2: Minimal disc bulge. No spinal canal stenosis or neural foraminal narrowing. L2-L3: Mild disc bulge with right foraminal protrusion. No spinal canal stenosis. Mild right neural foraminal narrowing. L3-L4: Mild disc bulge with central annular fissure. Mild facet arthropathy. Ligamentum flavum hypertrophy. At the disc level, there is moderate spinal canal stenosis, with moderate to severe spinal canal stenosis just inferior to this, the site of the L4 vertebral body retropulsion (series 12,  image 12). Effacement of the lateral recesses. Moderate bilateral neural foraminal narrowing. L4-L5: Minimal disc bulge with left foraminal protrusion. Mild facet arthropathy, with redemonstrated left facet synovial cyst. Ligamentum flavum hypertrophy. Narrowing of the lateral recesses. Mild spinal canal stenosis. Moderate left and severe right neural foraminal narrowing. L5-S1: No significant disc bulge. No spinal canal stenosis or neural foraminal narrowing. IMPRESSION: 1. Acute to subacute compression fracture of L4, with up to 75% vertebral body height loss centrally, previously 60% on 03/24/2023. 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 2. L3-L4 moderate spinal canal stenosis and moderate bilateral neural foraminal narrowing. 3. L4-L5 mild spinal canal  stenosis with moderate left and severe right neural foraminal narrowing. 4. L2-L3 mild right neural foraminal narrowing. 5. Increased T2 signal between the spinous processes of L4 and L5, which can be posttraumatic or seen in the setting of Baastrup's disease. Electronically Signed   By: Wiliam Ke M.D.   On: 04/08/2023 16:44   CT CHEST WO CONTRAST  Result Date: 03/31/2023 CLINICAL DATA:  Follow-up pulmonary nodule. Left lower quadrant abdominal pain. Lower GI bleed. EXAM: CT CHEST WITHOUT CONTRAST CT ABDOMEN AND PELVIS WITH CONTRAST TECHNIQUE: Multidetector CT imaging of the chest was performed following the standard protocol without IV contrast. Multidetector CT imaging of the abdomen and pelvis was performed following the standard protocol during bolus administration of intravenous contrast. RADIATION DOSE REDUCTION: This exam was performed according to the departmental dose-optimization program which includes automated exposure control, adjustment of the mA and/or kV according to patient size and/or use of iterative reconstruction technique. CONTRAST:  OMNIPAQUE IOHEXOL 300 MG/ML  SOLN COMPARISON:  Chest radiograph dated 03/05/2023. Lumbar spine CT dated 03/24/2023. CT abdomen/pelvis dated 06/17/2018. FINDINGS: CT CHEST FINDINGS Cardiovascular: The heart is normal in size. No pericardial effusion. No evidence of thoracic aneurysm. Atherosclerotic calcifications of the aortic root/arch. Severe three-vessel coronary atherosclerosis. Mediastinum/Nodes: Small mediastinal nodes, including a dominant 10 mm short axis subcarinal node (series 1/image 72). Visualized thyroid is unremarkable. Lungs/Pleura: 2.4 x 1.5 x 2.2 cm spiculated nodule in the anterior left upper lobe (coronal image 63), highly suspicious for primary bronchogenic carcinoma. Mild right basilar opacity, likely atelectasis. Mild patchy left lower lobe opacity, favoring pneumonia over atelectasis. Moderate centrilobular and paraseptal  emphysematous changes. 5 x 15 mm irregular platelike nodule in the posterior right upper lobe along the major fissure (series 4/image 34). Trace left pleural effusion.  No pneumothorax. Musculoskeletal: Degenerative changes of the thoracic spine. Mild anterior wedging at T9 (sagittal image 86). CT ABDOMEN PELVIS FINDINGS Hepatobiliary: Liver is within normal limits. Status post cholecystectomy. No intrahepatic or extrahepatic duct dilatation. Pancreas: Within normal limits. Spleen: Within normal limits. Adrenals/Urinary Tract: Adrenal glands are within normal limits. 5.4 cm simple right lower pole renal cyst (series 4/image 43). Kidneys are otherwise within normal limits. No hydronephrosis. Bladder is notable for mild eccentric wall thickening on the left (series 4/image 74), although underdistended. Stomach/Bowel: Stomach is within normal limits. No evidence of bowel obstruction. Normal appendix (series 4/image 50). Mild rectal wall thickening (series 4/image 39), raising the possibility of infectious/inflammatory proctitis. Associated trace presacral fluid. Vascular/Lymphatic: No evidence of abdominal aortic aneurysm. Atherosclerotic calcifications of the abdominal aorta and branch vessels, although vessels remain patent. No suspicious abdominopelvic lymphadenopathy. Reproductive: Prostate is notable for dystrophic calcifications and fiducial markers. Other: No abdominopelvic ascites.  Musculoskeletal: Moderate compression fracture deformity at L4 with mild retropulsion, unchanged from recent CT. Mild degenerative changes of the lumbar spine. IMPRESSION: 2.4 cm spiculated nodule in the anterior left upper lobe, highly suspicious for primary bronchogenic carcinoma. Contralateral 5 x 15 mm irregular nodule along the posterior right upper lobe, warranting attention on follow-up. Small mediastinal nodes, including a dominant 10 mm short axis subcarinal node, indeterminate. Mild patchy left lower lobe opacity, favoring  pneumonia over atelectasis. Trace left pleural effusion. Mild rectal wall thickening, raising the possibility of infectious/inflammatory proctitis. Additional ancillary findings as above. Aortic Atherosclerosis (ICD10-I70.0) and Emphysema (ICD10-J43.9). Electronically Signed   By: Charline Bills M.D.   On: 03/31/2023 20:05   CT ABDOMEN PELVIS W CONTRAST  Result Date: 03/31/2023 CLINICAL DATA:  Follow-up pulmonary nodule. Left lower quadrant abdominal pain. Lower GI bleed. EXAM: CT CHEST WITHOUT CONTRAST CT ABDOMEN AND PELVIS WITH CONTRAST TECHNIQUE: Multidetector CT imaging of the chest was performed following the standard protocol without IV contrast. Multidetector CT imaging of the abdomen and pelvis was performed following the standard protocol during bolus administration of intravenous contrast. RADIATION DOSE REDUCTION: This exam was performed according to the departmental dose-optimization program which includes automated exposure control, adjustment of the mA and/or kV according to patient size and/or use of iterative reconstruction technique. CONTRAST:  OMNIPAQUE IOHEXOL 300 MG/ML  SOLN COMPARISON:  Chest radiograph dated 03/05/2023. Lumbar spine CT dated 03/24/2023. CT abdomen/pelvis dated 06/17/2018. FINDINGS: CT CHEST FINDINGS Cardiovascular: The heart is normal in size. No pericardial effusion. No evidence of thoracic aneurysm. Atherosclerotic calcifications of the aortic root/arch. Severe three-vessel coronary atherosclerosis. Mediastinum/Nodes: Small mediastinal nodes, including a dominant 10 mm short axis subcarinal node (series 1/image 72). Visualized thyroid is unremarkable. Lungs/Pleura: 2.4 x 1.5 x 2.2 cm spiculated nodule in the anterior left upper lobe (coronal image 63), highly suspicious for primary bronchogenic carcinoma. Mild right basilar opacity, likely atelectasis. Mild patchy left lower lobe opacity, favoring pneumonia over atelectasis. Moderate centrilobular and paraseptal  emphysematous changes. 5 x 15 mm irregular platelike nodule in the posterior right upper lobe along the major fissure (series 4/image 34). Trace left pleural effusion.  No pneumothorax. Musculoskeletal: Degenerative changes of the thoracic spine. Mild anterior wedging at T9 (sagittal image 86). CT ABDOMEN PELVIS FINDINGS Hepatobiliary: Liver is within normal limits. Status post cholecystectomy. No intrahepatic or extrahepatic duct dilatation. Pancreas: Within normal limits. Spleen: Within normal limits. Adrenals/Urinary Tract: Adrenal glands are within normal limits. 5.4 cm simple right lower pole renal cyst (series 4/image 43). Kidneys are otherwise within normal limits. No hydronephrosis. Bladder is notable for mild eccentric wall thickening on the left (series 4/image 74), although underdistended. Stomach/Bowel: Stomach is within normal limits. No evidence of bowel obstruction. Normal appendix (series 4/image 50). Mild rectal wall thickening (series 4/image 39), raising the possibility of infectious/inflammatory proctitis. Associated trace presacral fluid. Vascular/Lymphatic: No evidence of abdominal aortic aneurysm. Atherosclerotic calcifications of the abdominal aorta and branch vessels, although vessels remain patent. No suspicious abdominopelvic lymphadenopathy. Reproductive: Prostate is notable for dystrophic calcifications and fiducial markers. Other: No abdominopelvic ascites. Musculoskeletal: Moderate compression fracture deformity at L4 with mild retropulsion, unchanged from recent CT. Mild degenerative changes of the lumbar spine. IMPRESSION: 2.4 cm spiculated nodule in the anterior left upper lobe, highly suspicious for primary bronchogenic carcinoma. Contralateral 5 x 15 mm irregular nodule along the posterior right upper lobe, warranting attention on follow-up. Small mediastinal nodes, including a dominant 10 mm short axis subcarinal node, indeterminate. Mild patchy left lower lobe  opacity, favoring  pneumonia over atelectasis. Trace left pleural effusion. Mild rectal wall thickening, raising the possibility of infectious/inflammatory proctitis. Additional ancillary findings as above. Aortic Atherosclerosis (ICD10-I70.0) and Emphysema (ICD10-J43.9). Electronically Signed   By: Charline Bills M.D.   On: 03/31/2023 20:05   CT HEAD WO CONTRAST ( )  Result Date: 03/31/2023 CLINICAL DATA:  Metastatic disease evaluation. EXAM: CT HEAD WITHOUT CONTRAST TECHNIQUE: Contiguous axial images were obtained from the base of the skull through the vertex without intravenous contrast. RADIATION DOSE REDUCTION: This exam was performed according to the departmental dose-optimization program which includes automated exposure control, adjustment of the mA and/or kV according to patient size and/or use of iterative reconstruction technique. COMPARISON:  None Available. FINDINGS: Brain: No evidence of acute infarction, hemorrhage, hydrocephalus, extra-axial collection or mass lesion/mass effect. Generalized brain atrophy. Mild chronic small vessel ischemia in the cerebral white matter. Small chronic left cerebellar infarct. Vascular: No hyperdense vessel or unexpected calcification. Skull: Normal. Negative for fracture or focal lesion. Sinuses/Orbits: No acute finding. IMPRESSION: Aging brain without acute superimposed finding. Electronically Signed   By: Tiburcio Pea M.D.   On: 03/31/2023 20:01   CT Lumbar Spine Wo Contrast  Result Date: 03/24/2023 CLINICAL DATA:  Fall EXAM: CT LUMBAR SPINE WITHOUT CONTRAST TECHNIQUE: Multidetector CT imaging of the lumbar spine was performed without intravenous contrast administration. Multiplanar CT image reconstructions were also generated. RADIATION DOSE REDUCTION: This exam was performed according to the departmental dose-optimization program which includes automated exposure control, adjustment of the mA and/or kV according to patient size and/or use of iterative reconstruction  technique. COMPARISON:  08/21/2021 lumbar spine MRI FINDINGS: Segmentation: 5 lumbar type vertebrae. Alignment: Normal. Vertebrae: Acute/subacute burst fracture of L4 with greater than 50% height loss. Retropulsion 4 mm. Paraspinal and other soft tissues: Calcific aortic atherosclerosis. Disc levels: No bony spinal canal stenosis. IMPRESSION: 1. Acute/subacute burst fracture of L4 with greater than 50% height loss and 4 mm retropulsion. Aortic Atherosclerosis (ICD10-I70.0). Electronically Signed   By: Deatra Robinson M.D.   On: 03/24/2023 21:01   DG Lumbar Spine Complete  Result Date: 03/24/2023 CLINICAL DATA:  Fall with back pain. EXAM: LUMBAR SPINE - COMPLETE 4+ VIEW COMPARISON:  Lumbar radiograph 07/02/2021, MRI 08/21/2021 FINDINGS: The bones are subjectively under mineralized. Moderate L4 compression deformity with at least 30% loss of height of the superior endplate. This is age indeterminate but new from prior exam. No additional fracture or compression deformity. The alignment is normal. Anterior spurring at multiple levels. Side L3-L4 disc space narrowing. Lower lumbar facet hypertrophy. Sacroiliac joints are congruent. IMPRESSION: 1. Moderate L4 compression deformity, age indeterminate but new from May 2023. 2. Mild degenerative disc disease and facet hypertrophy. Electronically Signed   By: Narda Rutherford M.D.   On: 03/24/2023 17:16   DG Pelvis 1-2 Views  Result Date: 03/24/2023 CLINICAL DATA:  Status post fall with pain. EXAM: PELVIS - 1-2 VIEW COMPARISON:  Pelvis and left hip radiograph 11/06/2022 FINDINGS: The bones are subjectively under mineralized. The cortical margins of the bony pelvis are intact. No fracture. Pubic symphysis and sacroiliac joints are congruent. Both femoral heads are well-seated in the respective acetabula. Mild bilateral hip osteoarthritis, without significant interval change. IMPRESSION: 1. No pelvic fracture. 2. Mild bilateral hip osteoarthritis. Electronically Signed    By: Narda Rutherford M.D.   On: 03/24/2023 17:11    Assessment/Plan PAD (peripheral artery disease) (HCC) - Plan: pregabalin (LYRICA) 100 MG capsule  Neuropathy - Plan: pregabalin (LYRICA) 100 MG capsule Patient  with continued neuropathic pain. Discussed difficulty with treating this disorder. Plan to increase lyrica dose. Discussed possible neurology vs pain management evaluation if there is no improvement on the current dosing. Plan to monitor for increased sedation with higher dosing of medication. Previous evaluation with vascular surgery without significant arteriosclerotic burden. Continue supportive care.   Family/ staff Communication: nursing  Labs/tests ordered:  none

## 2023-04-27 ENCOUNTER — Ambulatory Visit: Payer: Medicare Other | Admitting: Cardiology

## 2023-04-27 ENCOUNTER — Other Ambulatory Visit: Payer: Self-pay | Admitting: Student

## 2023-04-27 ENCOUNTER — Encounter: Payer: Self-pay | Admitting: Cardiology

## 2023-04-27 ENCOUNTER — Ambulatory Visit: Payer: Medicare Other | Attending: Cardiology | Admitting: Cardiology

## 2023-04-27 DIAGNOSIS — S32040S Wedge compression fracture of fourth lumbar vertebra, sequela: Secondary | ICD-10-CM

## 2023-04-27 MED ORDER — ALPRAZOLAM 0.5 MG PO TABS: 0.5000 mg | ORAL_TABLET | Freq: Every evening | ORAL | 0 refills | Status: DC | PRN

## 2023-04-27 MED ORDER — TRAMADOL HCL 50 MG PO TABS: 50.0000 mg | ORAL_TABLET | Freq: Three times a day (TID) | ORAL | 0 refills | Status: AC | PRN

## 2023-04-27 NOTE — Progress Notes (Signed)
Refill chronic anxiety medication and pain control.

## 2023-04-27 NOTE — Progress Notes (Unsigned)
Clinical Summary Mr. Parman is a 82 y.o.male   seen today for follow up of lthe following medical problems.    1. Aortic stenosis - 06/2017 echo LVEF 60-65%, grade II diastolic dysfunction, moderate to severe AS per report (mean grad 22, AVA VTI 1, dimensionless index 0.35). Criteria would suggest more moderate AS   - 07/2018 echo :LVEF >65%, mean grad 25, AVA VTI 0.65, dimensionless index 0.33, SVI         12/2021 echo: LVEF 60-65%, severe AS mean grad 35, AVA VTI 0.82, DI 0.24, SVI 40 - some SOB at times, feeling of needing to take deep breaths at rest - sedentary lifestyle due to LE  neuropathy   02/2023 echo: LVEF 60-65%, no WMAs, grade I dd, mild MR, severe AS mean grad 39, AVA VTI 0.93, DI 0.30   2. Mitral stenosis - mild by 12/2022 echo mean grad 4   2. HTN -compliant with meds   3. Hyperlipidemia - reports side effects on other statins in the past. Has done well on simva 20.  Labs followed by pcp   Jan 2023 TC 118 HDL 33 LDL 55 TG 148 - upcomng labs with pcp   4. Sinus tachcyardia Patient with frequent elevated heart rates in clinic. Checks heart rates regularly at home, typically in low 90s. Often has anxiety in the clinic       5. Chronic LBBB  6. Fall - admit 03/2023 with fall, lumbar fracture - wearing TLSO brace with plans for outpatient neurosurg f/u, no inpatient surgical intervention  7. GI bleed - 03/2023 admission also complicated by GI bleed - CT abdomen pelvis demonstrating colitis changes -Status post sigmoidoscopy demonstrating large mucosal ulceration in the rectum; lesion has been biopsied.  8. Lung nodule - CT 2.4 cm spiculated nodule concerning for primary bronchogenic carcinoma - was to f/u with pulmonary - plans for PET, cosniderations for biopsy Past Medical History:  Diagnosis Date   Aortic stenosis, mod 2021   And insufficiency, evaluation by Dr. Daleen Squibb in 2000   Arthritis    Benign prostatic hypertrophy    PSA of 1.39 in  09/2010   Chronic kidney disease    Borderline; creatinine of 1.6 in 08/2009, but 1.15 in 05/2010   Chronic sinusitis    By MRI   Diverticulosis 2013   GERD (gastroesophageal reflux disease)    Hearing impaired    Hemorrhoids 2006   Hiatal hernia 2006   HOH (hard of hearing)    Hyperlipidemia    Elevated triglycerides   Hypertension    Mild internal carotid artery plaque on MRI/MRA in 2002   Hypothyroidism    IBS (irritable bowel syndrome)    Insomnia    Left bundle Sharica Roedel block 2012   2012   Metabolic syndrome    Fasting hyperglycemia   Murmur    Nephrolithiasis 08/2009   2011   Obesity    Proctitis    radiation induced   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   s/p XRT   Seasonal allergies    Stroke (HCC)    TIA, no residual, seen on MRI   Tobacco abuse, in remission    60 pack years; quit in 1999   Uses hearing aid    bilateral     Allergies  Allergen Reactions   Norvasc [Amlodipine Besylate] Swelling    Ankle swelling   Codeine Nausea And Vomiting   Amoxicillin Rash     Current Outpatient Medications  Medication  Sig Dispense Refill   acetaminophen (TYLENOL) 500 MG tablet Take 2 tablets (1,000 mg total) by mouth every 8 (eight) hours as needed for mild pain (pain score 1-3). 100 tablet 2   acyclovir (ZOVIRAX) 800 MG tablet Take 800 mg by mouth every 8 (eight) hours as needed. And give one tablet by mouth once daily.     ALPRAZolam (XANAX) 0.5 MG tablet Take 1 tablet (0.5 mg total) by mouth at bedtime as needed for sleep. 30 tablet 0   alum & mag hydroxide-simeth (MAALOX PLUS) 400-400-40 MG/5ML suspension Take 5 mLs by mouth every 4 (four) hours as needed for indigestion.     Ascorbic Acid (VITAMIN C) 1000 MG tablet Take 1,000 mg by mouth daily.     docusate sodium (COLACE) 100 MG capsule Take 1 capsule (100 mg total) by mouth 2 (two) times daily. 10 capsule 0   DULoxetine (CYMBALTA) 30 MG capsule Take 30 mg by mouth daily.     finasteride (PROSCAR) 5 MG tablet Take 1  tablet (5 mg total) by mouth daily. 90 tablet 3   fluticasone (CUTIVATE) 0.05 % cream SMARTSIG:Topical 1-2 Times Daily PRN     furosemide (LASIX) 40 MG tablet Take 40 mg by mouth daily.     levothyroxine (SYNTHROID) 25 MCG tablet Take 25 mcg by mouth daily.     meclizine (ANTIVERT) 12.5 MG tablet Take 1 tablet (12.5 mg total) by mouth 3 (three) times daily as needed for dizziness.     Melatonin 3 MG TABS Take 3 mg by mouth at bedtime. For sleep (Patient not taking: Reported on 04/22/2023)     melatonin 5 MG TABS Take 5 mg by mouth at bedtime. Both 3 and 5 mg is listed at [facility]     methocarbamol (ROBAXIN) 500 MG tablet Take 1 tablet (500 mg total) by mouth every 8 (eight) hours as needed for muscle spasms.     oxyCODONE (OXY IR/ROXICODONE) 5 MG immediate release tablet Take 1 tablet (5 mg total) by mouth every 6 (six) hours as needed for severe pain (pain score 7-10). 30 tablet 0   pantoprazole (PROTONIX) 40 MG tablet Take 1 tablet (40 mg total) by mouth daily before breakfast. 90 tablet 3   Polyethyl Glycol-Propyl Glycol (SYSTANE OP) Place 1 drop into both eyes 2 (two) times daily as needed (dry eyes).     polyethylene glycol (MIRALAX / GLYCOLAX) 17 g packet Take 17 g by mouth daily. Can be spread to every other day in the setting of diarrhea.     potassium chloride (KLOR-CON M) 10 MEQ tablet Take 1 tablet (10 mEq total) by mouth daily. 30 tablet 0   pregabalin (LYRICA) 100 MG capsule Take 1 capsule (100 mg total) by mouth 3 (three) times daily. 90 capsule 0   psyllium (HYDROCIL/METAMUCIL) 95 % PACK Take 1 packet by mouth 2 (two) times daily.     silodosin (RAPAFLO) 8 MG CAPS capsule Take 1 capsule (8 mg total) by mouth 2 (two) times daily. 180 capsule 3   simvastatin (ZOCOR) 20 MG tablet Take 20 mg by mouth every evening.     sucralfate (CARAFATE) 1 GM/10ML suspension Provide 1 gram enema twice a day. (Patient not taking: Reported on 04/22/2023)     sucralfate (CARAFATE) 1 GM/10ML suspension  20 ml by mouth before mesals for GERD     traMADol (ULTRAM) 50 MG tablet Take 1 tablet (50 mg total) by mouth every 8 (eight) hours as needed for moderate pain (  pain score 4-6). 45 tablet 0   vitamin B-12 (CYANOCOBALAMIN) 1000 MCG tablet Take 2,000 mcg by mouth daily.     Zinc Oxide (TRIPLE PASTE) 12.8 % ointment Apply 1 Application topically. Every shift.     No current facility-administered medications for this visit.     Past Surgical History:  Procedure Laterality Date   ANKLE FRACTURE SURGERY     left   BIOPSY  12/31/2022   Procedure: BIOPSY;  Surgeon: Corbin Ade, MD;  [facility]: [facility];  Service: Endoscopy;;   CATARACT EXTRACTION Left    CATARACT EXTRACTION W/PHACO Right 12/19/2013   Procedure: CATARACT EXTRACTION PHACO AND INTRAOCULAR LENS PLACEMENT RIGHT EYE CDE=14.78;  Surgeon: Gemma Payor, MD;  Location: [facility];  Service: Ophthalmology;  Laterality: Right;   CHOLECYSTECTOMY     COLONOSCOPY  2006   Dr. Henry Russel mucosa throughout colon, moderate internal hemorrhoids. bx= benign colonic mucosa   COLONOSCOPY  12/03/2011   Dr. Jena Gauss- colonic diverticulosis, radiation-induced proctitis- s/p APC ablation, no microscopic colitis on bx   COLONOSCOPY WITH PROPOFOL N/A 06/12/2020   Procedure: COLONOSCOPY WITH PROPOFOL;  Surgeon: Corbin Ade, MD;  Location: [facility];  Service: Endoscopy;  Laterality: N/A;  2:45pm   ESOPHAGOGASTRODUODENOSCOPY  2006   Dr. Randa Evens- small hiatal hernia and slightly reddened distal esophagus o/w normal   ESOPHAGOGASTRODUODENOSCOPY (EGD) WITH PROPOFOL N/A 12/31/2022   Procedure: ESOPHAGOGASTRODUODENOSCOPY (EGD) WITH PROPOFOL;  Surgeon: Corbin Ade, MD;  [facility]: [facility];  Service: Endoscopy;  Laterality: N/A;  1245pm, asa 3   FLEXIBLE SIGMOIDOSCOPY N/A 07/13/2013   EAV:[id number] changes of the rectum with active oozing consistent with chronic radiation proctitis.  Otherwise, negative sigmoidoscopy to 40 cm.Status post argon plasma  coagulation ablation    FLEXIBLE SIGMOIDOSCOPY N/A 04/02/2023   Procedure: FLEXIBLE SIGMOIDOSCOPY;  Surgeon: Franky Macho, MD;  [facility];  Service: Endoscopy;  Laterality: N/A;   HOT HEMOSTASIS N/A 07/13/2013   Procedure: HOT HEMOSTASIS (ARGON PLASMA COAGULATION/BICAP);  Surgeon: Corbin Ade, MD;  [facility];  Service: Endoscopy;  Laterality: N/A;   HOT HEMOSTASIS  04/02/2023   Procedure: HOT HEMOSTASIS (ARGON PLASMA COAGULATION/BICAP);  Surgeon: Franky Macho, MD;  [facility];  Service: Endoscopy;;   Ileocolonoscopy  12/03/2011   GNF:[id number] diverticulosis. Radiation-induced proctitis status post APC ablation. Status post segmental colon Biopsy   MALONEY DILATION N/A 12/31/2022   Procedure: MALONEY DILATION;  Surgeon: Corbin Ade, MD;  [facility]: [facility];  Service: Endoscopy;  Laterality: N/A;   TONSILLECTOMY       Allergies  Allergen Reactions   Norvasc [Amlodipine Besylate] Swelling    Ankle swelling   Codeine Nausea And Vomiting   Amoxicillin Rash      Family History  Problem Relation Age of Onset   Heart failure Mother    Heart attack Father    Heart failure Brother        bladder cancer (agent orange exposure)   Cirrhosis Brother        chronic viral hepatitis   Colon cancer Neg Hx      Social History Mr. Gibas reports that he quit smoking about 25 years ago. His smoking use included cigarettes. He started smoking about 65 years ago. He has a 60 pack-year smoking history. He has never been exposed to tobacco smoke. He has never used smokeless tobacco. Mr. Kopecky reports no history of alcohol use.   Review of Systems CONSTITUTIONAL: No weight loss, fever,  chills, weakness or fatigue.  HEENT: Eyes: No visual loss, blurred vision, double vision or yellow sclerae.No hearing loss, sneezing, congestion, runny nose or sore throat.  SKIN: No rash or itching.  CARDIOVASCULAR:  RESPIRATORY: No shortness of breath,  cough or sputum.  GASTROINTESTINAL: No anorexia, nausea, vomiting or diarrhea. No abdominal pain or blood.  GENITOURINARY: No burning on urination, no polyuria NEUROLOGICAL: No headache, dizziness, syncope, paralysis, ataxia, numbness or tingling in the extremities. No change in bowel or bladder control.  MUSCULOSKELETAL: No muscle, back pain, joint pain or stiffness.  LYMPHATICS: No enlarged nodes. No history of splenectomy.  PSYCHIATRIC: No history of depression or anxiety.  ENDOCRINOLOGIC: No reports of sweating, cold or heat intolerance. No polyuria or polydipsia.  Marland Kitchen   Physical Examination There were no vitals filed for this visit. There were no vitals filed for this visit.  Gen: resting comfortably, no acute distress HEENT: no scleral icterus, pupils equal round and reactive, no palptable cervical adenopathy,  CV Resp: Clear to auscultation bilaterally GI: abdomen is soft, non-tender, non-distended, normal bowel sounds, no hepatosplenomegaly MSK: extremities are warm, no edema.  Skin: warm, no rash Neuro:  no focal deficits Psych: appropriate affect   Diagnostic Studies 07/2012 Echo LVEF 55-60%, no WMA, mild-mod AS (mean grad 11, AVA by planimetry 1.2, continuity area not listed)   07/2014 echo Study Conclusions  - Left ventricle: The cavity size was normal. Wall thickness was   increased in a pattern of mild LVH. Systolic function was normal.   The estimated ejection fraction was in the range of 60% to 65%.   Wall motion was normal; there were no regional wall motion   abnormalities. Doppler parameters are consistent with abnormal   left ventricular relaxation (grade 1 diastolic dysfunction). - Ventricular septum: Septal motion showed abnormal function and   dyssynergy. - Aortic valve: Moderately calcified annulus. Trileaflet;   moderately calcified leaflets. Cusp separation was moderately   reduced. There was moderate stenosis. There was mild   regurgitation. Mean  gradient (S): 18 mm Hg. Peak gradient (S): 32   mm Hg. VTI ratio of LVOT to aortic valve: 0.43. Valve area (VTI):   1.25 cm^2. Planimetry valve area 1.5 cm2. - Mitral valve: Moderately calcified annulus. There was trivial   regurgitation. - Left atrium: The atrium was mildly dilated. - Right atrium: Central venous pressure (est): 3 mm Hg. - Atrial septum: No defect or patent foramen ovale was identified. - Tricuspid valve: There was trivial regurgitation. - Pulmonary arteries: Systolic pressure could not be accurately   estimated. - Pericardium, extracardiac: There was no pericardial effusion.     07/2016 echo Study Conclusions   - Left ventricle: The cavity size was normal. Wall thickness was   increased in a pattern of mild LVH. Systolic function was normal.   The estimated ejection fraction was in the range of 60% to 65%.   Wall motion was normal; there were no regional wall motion   abnormalities. - Aortic valve: Moderately calcified annulus. Moderately calcified   leaflets. There was mild regurgitation. Mean gradient (S): 18 mm   Hg. Peak gradient (S): 36 mm Hg. VTI ratio of LVOT to aortic   valve: 0.38. Valve area (VTI): 0.77 cm^2. Somewhat discordant   information in grading degree of aortic stenosis. Gradients   suggest mild range, however valve appearance and LVOT to AV   ratios are more consistent with moderate to severe aortic   stenosis. - Mitral valve: Moderately calcified annulus. There was  trivial   regurgitation. - Right atrium: Central venous pressure (est): 3 mm Hg. - Atrial septum: No defect or patent foramen ovale was identified. - Tricuspid valve: There was trivial regurgitation. - Pulmonary arteries: Systolic pressure could not be accurately   estimated. - Pericardium, extracardiac: A prominent pericardial fat pad was   present.   Impressions:   - Mild LVH with LVEF 60-65%. Grade 2 diastolic dysfunction   suspected. Aortic valve is moderately calcified  with at least   moderate calcific aortic stenosis as detailed above, information   is somewhat discordant however. Mild aortic regurgitation.   Moderate mitral annular calcification with trivial mitral   regurgitation. Trivial tricuspid regurgitation.   06/2017 echo Study Conclusions   - Left ventricle: The cavity size was normal. There was severe   concentric hypertrophy. Systolic function was normal. The   estimated ejection fraction was in the range of 60% to 65%. Wall   motion was normal; there were no regional wall motion   abnormalities. Features are consistent with a pseudonormal left   ventricular filling pattern, with concomitant abnormal relaxation   and increased filling pressure (grade 2 diastolic dysfunction).   Doppler parameters are consistent with high ventricular filling   pressure. - Ventricular septum: Septal motion showed abnormal function and   dyssynergy. These changes are consistent with a left bundle   Mirko Tailor block. - Aortic valve: Moderately to severely calcified annulus.   Trileaflet; moderately calcified leaflets. There was moderate to   severe stenosis. There was mild regurgitation. Peak velocity (S):   325 cm/s. Mean gradient (S): 22 mm Hg. Valve area (VTI): 1 cm^2.   Valve area (Vmax): 0.9 cm^2. Valve area (Vmean): 0.99 cm^2. - Mitral valve: Moderately calcified annulus. - Right ventricle: Systolic function was mildly reduced. - Atrial septum: No defect or patent foramen ovale was identified.   07/2018 echo IMPRESSIONS     1. The left ventricle has hyperdynamic systolic function, with an  ejection fraction of >65%. There is moderate asymmetric septal left  ventricular hypertrophy. Diastolic parameters are consistent with  restrictive filling.   2. The right ventricle has normal systolic function. The cavity was  normal. There is no increase in right ventricular wall thickness. Right  ventricular systolic pressure could not be assessed.   3. The  mitral valve is normal in structure. There is moderate mitral  annular calcification present.   4. The aortic root is normal in size and structure.   5. The tricuspid valve is normal in structure.   6. Aortic valve is possibly tricuspid, but leaflet structure not well  defined. Aortic valve regurgitation is mild by color flow Doppler. There  is moderate to more likely severe aortic stenosis with LVOT/AV VTI 0.33  and calculated valve area 0.83 cm2.  Gradients suggest paradoxical low gradient stenosis in the setting of  normal LVEF.      12/2021 echo 1. The aortic valve is tricuspid. There is severe calcifcation of the  aortic valve. There is severe thickening of the aortic valve. Aortic valve  regurgitation is mild. Severe aortic valve stenosis. Aortic valve mean  gradient measures 35.0 mmHg. Aortic  valve Vmax measures 3.90 m/s. AVA by continuity 0.82cm2, DI 0.24.   2. Left ventricular ejection fraction, by estimation, is 60 to 65%. The  left ventricle has normal function. The left ventricle has no regional  wall motion abnormalities. 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 Diastolic function  indeterminant due to severe MAC.  3. Right ventricular systolic function is normal. The right ventricular  size is normal.   4. Left atrial size was moderately dilated.   5. The mitral valve is degenerative. Trivial mitral valve regurgitation.  Mild calcific mitral stenosis. The mean mitral valve gradient is 4.0 mmHg  at HR 74bpm. Severe mitral annular calcification.   6. The inferior vena cava is normal in size with greater than 50%  respiratory variability, suggesting right atrial pressure of 3 mmHg.      02/2023 echo  1. Left ventricular ejection fraction, by estimation, is 60 to 65%. The  left ventricle has normal function. The left ventricle has no regional  wall motion abnormalities. There is moderate left ventricular hypertrophy.  Left ventricular diastolic   parameters are consistent with Grade I diastolic dysfunction (impaired  relaxation). Elevated left atrial pressure.   2. Right ventricular systolic function is normal. The right ventricular  size is normal. Tricuspid regurgitation signal is inadequate for assessing  PA pressure.   3. The mitral valve is abnormal. Mild mitral valve regurgitation. No  evidence of mitral stenosis. The mean mitral valve gradient is 3.0 mmHg.HR  65 bpm. Moderate mitral annular calcification.   4. The aortic valve was not well visualized. There is severe calcifcation  of the aortic valve. There is severe thickening of the aortic valve.  Aortic valve regurgitation is mild. Severe aortic valve stenosis. Aortic  valve mean gradient measures 39.0  mmHg. Aortic valve peak gradient measures 50.1 mmHg. Aortic valve area, by  VTI measures 0.93 cm.   5. The inferior vena cava is normal in size with greater than 50%  respiratory variability, suggesting right atrial pressure of 3 mmHg.   Assessment and Plan   1. Aortic stenosis - severe by 12/2021 echo. 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 For his level of exertion denies specific symptoms - f/u repeat echo, if any change in LVEF would warrant evaluation for TAVR. If stable LVEF f/u 3 months reassess if any more distinct symptoms.    2. HTN - at goal, continue current meds   3. Hyperlipidemia - has been at goal, f/u upcoming labs       Antoine Poche, M.D., F.A.C.C.

## 2023-04-28 ENCOUNTER — Ambulatory Visit
Admission: RE | Admit: 2023-04-28 | Discharge: 2023-04-28 | Disposition: A | Discharge status: Home / Self Care | Payer: Medicare Other | Source: Ambulatory Visit | Attending: Pulmonary Disease | Admitting: Pulmonary Disease

## 2023-04-28 ENCOUNTER — Ambulatory Visit: Payer: Medicare Other | Admitting: Internal Medicine

## 2023-04-28 DIAGNOSIS — R911 Solitary pulmonary nodule: Secondary | ICD-10-CM | POA: Insufficient documentation

## 2023-04-28 LAB — GLUCOSE, CAPILLARY: Glucose-Capillary: 144 mg/dL — ABNORMAL HIGH (ref 70–99)

## 2023-04-28 MED ORDER — FLUDEOXYGLUCOSE F - 18 (FDG) INJECTION
7.3000 | Freq: Once | INTRAVENOUS | Status: AC | PRN
Start: 2023-04-28 — End: 2023-04-28
  Administered 2023-04-28: 6.84 via INTRAVENOUS

## 2023-04-30 ENCOUNTER — Telehealth: Payer: Self-pay | Admitting: Pulmonary Disease

## 2023-04-30 NOTE — Telephone Encounter (Signed)
Patient and son would like to know the results of his previous CT and PET Scan.

## 2023-04-30 NOTE — Telephone Encounter (Signed)
Both test were done on 12/3. I know the official results are not back. Can you give your interpretation of the scans?

## 2023-05-01 NOTE — Telephone Encounter (Signed)
Per secure chat from Dr. Windell Moulding, I called him yesterday and relayed the results.

## 2023-05-01 NOTE — Addendum Note (Signed)
Addended by: Janann Colonel on: 05/01/2023 12:47 PM   Modules accepted: Orders

## 2023-05-04 ENCOUNTER — Other Ambulatory Visit: Payer: Self-pay | Admitting: Student

## 2023-05-04 ENCOUNTER — Encounter: Payer: Self-pay | Admitting: Pulmonary Disease

## 2023-05-04 DIAGNOSIS — S32040S Wedge compression fracture of fourth lumbar vertebra, sequela: Secondary | ICD-10-CM

## 2023-05-04 MED ORDER — ALPRAZOLAM 0.5 MG PO TABS: 0.5000 mg | ORAL_TABLET | Freq: Every day | ORAL | 0 refills | Status: DC

## 2023-05-04 NOTE — Progress Notes (Signed)
Chronic insomina medication

## 2023-05-05 ENCOUNTER — Ambulatory Visit: Payer: Medicare Other | Admitting: Orthopedic Surgery

## 2023-05-05 ENCOUNTER — Ambulatory Visit: Payer: Medicare Other | Admitting: Internal Medicine

## 2023-05-06 ENCOUNTER — Ambulatory Visit: Payer: Medicare Other | Admitting: Urology

## 2023-05-11 ENCOUNTER — Ambulatory Visit: Payer: Medicare Other | Admitting: Cardiology

## 2023-05-11 ENCOUNTER — Encounter: Payer: Self-pay | Admitting: Cardiology

## 2023-05-11 ENCOUNTER — Ambulatory Visit: Payer: Medicare Other | Attending: Cardiology | Admitting: Cardiology

## 2023-05-11 VITALS — BP 122/68 | HR 126 | Ht 66.0 in | Wt 145.8 lb

## 2023-05-11 DIAGNOSIS — I350 Nonrheumatic aortic (valve) stenosis: Secondary | ICD-10-CM | POA: Diagnosis not present

## 2023-05-11 DIAGNOSIS — Z01810 Encounter for preprocedural cardiovascular examination: Secondary | ICD-10-CM | POA: Insufficient documentation

## 2023-05-11 NOTE — Patient Instructions (Signed)
Medication Instructions:  Your physician recommends that you continue on your current medications as directed. Please refer to the Current Medication list given to you today.  *If you need a refill on your cardiac medications before your next appointment, please call your pharmacy*   Lab Work: NONE   If you have labs (blood work) drawn today and your tests are completely normal, you will receive your results only by: MyChart Message (if you have MyChart) OR A paper copy in the mail If you have any lab test that is abnormal or we need to change your treatment, we will call you to review the results.   Testing/Procedures: NONE     Follow-Up: At [facility], you and your health needs are our priority.  As part of our continuing mission to provide you with exceptional heart care, we have created designated Provider Care Teams.  These Care Teams include your primary Cardiologist (physician) and Advanced Practice Providers (APPs -  Physician Assistants and Nurse Practitioners) who all work together to provide you with the care you need, when you need it.  We recommend signing up for the patient portal called "MyChart".  Sign up information is provided on this After Visit Summary.  MyChart is used to connect with patients for Virtual Visits (Telemedicine).  Patients are able to view lab/test results, encounter notes, upcoming appointments, etc.  Non-urgent messages can be sent to your provider as well.   To learn more about what you can do with MyChart, go to [email or web address].    Your next appointment:    April   Provider:   Dina Rich, MD    Other Instructions Thank you for choosing [facility]!

## 2023-05-11 NOTE — Progress Notes (Signed)
Clinical Summary Mr. Quinley is a 82 y.o.male seen today for follow up of lthe following medical problems.    1. Aortic stenosis - 06/2017 echo LVEF 60-65%, grade II diastolic dysfunction, moderate to severe AS per report (mean grad 22, AVA VTI 1, dimensionless index 0.35). Criteria would suggest more moderate AS  - 07/2018 echo :LVEF >65%, mean grad 25, AVA VTI 0.65, dimensionless index 0.33, SVI    12/2021 echo: LVEF 60-65%, severe AS mean grad 35, AVA VTI 0.82, DI 0.24, SVI 40  02/2023 echo: LVEF 60-65%, no WMAs, grade I dd, severe AS mean grad 39 AVA VTI 0.93, DI 0.30   - denies any exertional symptoms thought limited by recent verterbal fracture wearing a brace, chronic neuropathy.  - highest level of activity is walking with walker up to about 10 minutes at his rehab facility, denies any symptoms   2. Preoperative evaluation - recent lung mass detected, + on PET very concerning for malignancy - pulmonology is consider bronch with biopsy under general anesthesia vs transthoracic needle biopsy under local anesthesia, with bronch being the more superior from diagnostic standpoint.        Other medical issues not addressed this visit   2. Mitral stenosis - mild by 12/2022 echo mean grad 4   2. HTN -compliant with meds   3. Hyperlipidemia - reports side effects on other statins in the past. Has done well on simva 20.  Labs followed by pcp   Jan 2023 TC 118 HDL 33 LDL 55 TG 148 - upcomng labs with pcp   4. Sinus tachcyardia Patient with frequent elevated heart rates in clinic. Checks heart rates regularly at home, typically in low 90s. Often has anxiety in the clinic       5. Chronic LBBB   6. GI bleed - admit 03/2023 - CT abdomen pelvis demonstrating colitis changes -Status post sigmoidoscopy demonstrating large mucosal ulceration in the rectum; lesion has been biopsied.       Past Medical History:  Diagnosis Date   Aortic stenosis, mod 2021   And  insufficiency, evaluation by Dr. Daleen Squibb in 2000   Arthritis    Benign prostatic hypertrophy    PSA of 1.39 in 09/2010   Chronic kidney disease    Borderline; creatinine of 1.6 in 08/2009, but 1.15 in 05/2010   Chronic sinusitis    By MRI   Diverticulosis 2013   GERD (gastroesophageal reflux disease)    Hearing impaired    Hemorrhoids 2006   Hiatal hernia 2006   HOH (hard of hearing)    Hyperlipidemia    Elevated triglycerides   Hypertension    Mild internal carotid artery plaque on MRI/MRA in 2002   Hypothyroidism    IBS (irritable bowel syndrome)    Insomnia    Left bundle Alizandra Loh block 2012   2012   Metabolic syndrome    Fasting hyperglycemia   Murmur    Nephrolithiasis 08/2009   2011   Obesity    Proctitis    radiation induced   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   s/p XRT   Seasonal allergies    Stroke (HCC)    TIA, no residual, seen on MRI   Tobacco abuse, in remission    60 pack years; quit in 1999   Uses hearing aid    bilateral     Allergies  Allergen Reactions   Norvasc [Amlodipine Besylate] Swelling    Ankle swelling   Codeine Nausea And  Vomiting   Amoxicillin Rash     Current Outpatient Medications  Medication Sig Dispense Refill   acetaminophen (TYLENOL) 500 MG tablet Take 2 tablets (1,000 mg total) by mouth every 8 (eight) hours as needed for mild pain (pain score 1-3). 100 tablet 2   acyclovir (ZOVIRAX) 800 MG tablet Take 800 mg by mouth every 8 (eight) hours as needed. And give one tablet by mouth once daily.     ALPRAZolam (XANAX) 0.5 MG tablet Take 1 tablet (0.5 mg total) by mouth at bedtime. 30 tablet 0   alum & mag hydroxide-simeth (MAALOX PLUS) 400-400-40 MG/5ML suspension Take 5 mLs by mouth every 4 (four) hours as needed for indigestion.     Ascorbic Acid (VITAMIN C) 1000 MG tablet Take 1,000 mg by mouth daily.     docusate sodium (COLACE) 100 MG capsule Take 1 capsule (100 mg total) by mouth 2 (two) times daily. 10 capsule 0   DULoxetine  (CYMBALTA) 30 MG capsule Take 30 mg by mouth daily.     finasteride (PROSCAR) 5 MG tablet Take 1 tablet (5 mg total) by mouth daily. 90 tablet 3   fluticasone (CUTIVATE) 0.05 % cream SMARTSIG:Topical 1-2 Times Daily PRN     furosemide (LASIX) 40 MG tablet Take 40 mg by mouth daily.     levothyroxine (SYNTHROID) 25 MCG tablet Take 25 mcg by mouth daily.     meclizine (ANTIVERT) 12.5 MG tablet Take 1 tablet (12.5 mg total) by mouth 3 (three) times daily as needed for dizziness.     Melatonin 3 MG TABS Take 3 mg by mouth at bedtime. For sleep (Patient not taking: Reported on 04/22/2023)     melatonin 5 MG TABS Take 5 mg by mouth at bedtime. Both 3 and 5 mg is listed at [facility]     methocarbamol (ROBAXIN) 500 MG tablet Take 1 tablet (500 mg total) by mouth every 8 (eight) hours as needed for muscle spasms.     oxyCODONE (OXY IR/ROXICODONE) 5 MG immediate release tablet Take 1 tablet (5 mg total) by mouth every 6 (six) hours as needed for severe pain (pain score 7-10). 30 tablet 0   pantoprazole (PROTONIX) 40 MG tablet Take 1 tablet (40 mg total) by mouth daily before breakfast. 90 tablet 3   Polyethyl Glycol-Propyl Glycol (SYSTANE OP) Place 1 drop into both eyes 2 (two) times daily as needed (dry eyes).     polyethylene glycol (MIRALAX / GLYCOLAX) 17 g packet Take 17 g by mouth daily. Can be spread to every other day in the setting of diarrhea.     potassium chloride (KLOR-CON M) 10 MEQ tablet Take 1 tablet (10 mEq total) by mouth daily. 30 tablet 0   psyllium (HYDROCIL/METAMUCIL) 95 % PACK Take 1 packet by mouth 2 (two) times daily.     silodosin (RAPAFLO) 8 MG CAPS capsule Take 1 capsule (8 mg total) by mouth 2 (two) times daily. 180 capsule 3   simvastatin (ZOCOR) 20 MG tablet Take 20 mg by mouth every evening.     sucralfate (CARAFATE) 1 GM/10ML suspension Provide 1 gram enema twice a day. (Patient not taking: Reported on 04/22/2023)     sucralfate (CARAFATE) 1 GM/10ML suspension 20 ml by mouth  before mesals for GERD     traMADol (ULTRAM) 50 MG tablet Take 1 tablet (50 mg total) by mouth every 8 (eight) hours as needed for moderate pain (pain score 4-6). 45 tablet 0   vitamin B-12 (CYANOCOBALAMIN) 1000  MCG tablet Take 2,000 mcg by mouth daily.     Zinc Oxide (TRIPLE PASTE) 12.8 % ointment Apply 1 Application topically. Every shift.     No current facility-administered medications for this visit.     Past Surgical History:  Procedure Laterality Date   ANKLE FRACTURE SURGERY     left   BIOPSY  12/31/2022   Procedure: BIOPSY;  Surgeon: Corbin Ade, MD;  Location: [facility];  Service: Endoscopy;;   CATARACT EXTRACTION Left    CATARACT EXTRACTION W/PHACO Right 12/19/2013   Procedure: CATARACT EXTRACTION PHACO AND INTRAOCULAR LENS PLACEMENT RIGHT EYE CDE=14.78;  Surgeon: Gemma Payor, MD;  Location: [facility];  Service: Ophthalmology;  Laterality: Right;   CHOLECYSTECTOMY     COLONOSCOPY  2006   Dr. Henry Russel mucosa throughout colon, moderate internal hemorrhoids. bx= benign colonic mucosa   COLONOSCOPY  12/03/2011   Dr. Jena Gauss- colonic diverticulosis, radiation-induced proctitis- s/p APC ablation, no microscopic colitis on bx   COLONOSCOPY WITH PROPOFOL N/A 06/12/2020   Procedure: COLONOSCOPY WITH PROPOFOL;  Surgeon: Corbin Ade, MD;  [facility]: [facility];  Service: Endoscopy;  Laterality: N/A;  2:45pm   ESOPHAGOGASTRODUODENOSCOPY  2006   Dr. Randa Evens- small hiatal hernia and slightly reddened distal esophagus o/w normal   ESOPHAGOGASTRODUODENOSCOPY (EGD) WITH PROPOFOL N/A 12/31/2022   Procedure: ESOPHAGOGASTRODUODENOSCOPY (EGD) WITH PROPOFOL;  Surgeon: Corbin Ade, MD;  [facility]: [facility];  Service: Endoscopy;  Laterality: N/A;  1245pm, asa 3   FLEXIBLE SIGMOIDOSCOPY N/A 07/13/2013   [id number] changes of the rectum with active oozing consistent with chronic radiation proctitis.  Otherwise, negative sigmoidoscopy to 40 cm.Status post argon plasma coagulation  ablation    FLEXIBLE SIGMOIDOSCOPY N/A 04/02/2023   Procedure: FLEXIBLE SIGMOIDOSCOPY;  Surgeon: Franky Macho, MD;  [facility]: [facility];  Service: Endoscopy;  Laterality: N/A;   HOT HEMOSTASIS N/A 07/13/2013   Procedure: HOT HEMOSTASIS (ARGON PLASMA COAGULATION/BICAP);  Surgeon: Corbin Ade, MD;  [facility];  Service: Endoscopy;  Laterality: N/A;   HOT HEMOSTASIS  04/02/2023   Procedure: HOT HEMOSTASIS (ARGON PLASMA COAGULATION/BICAP);  Surgeon: Franky Macho, MD;  [facility];  Service: Endoscopy;;   Ileocolonoscopy  12/03/2011   [id number] diverticulosis. Radiation-induced proctitis status post APC ablation. Status post segmental colon Biopsy   MALONEY DILATION N/A 12/31/2022   Procedure: MALONEY DILATION;  Surgeon: Corbin Ade, MD;  [facility]: [facility];  Service: Endoscopy;  Laterality: N/A;   TONSILLECTOMY       Allergies  Allergen Reactions   Norvasc [Amlodipine Besylate] Swelling    Ankle swelling   Codeine Nausea And Vomiting   Amoxicillin Rash      Family History  Problem Relation Age of Onset   Heart failure Mother    Heart attack Father    Heart failure Brother        bladder cancer (agent orange exposure)   Cirrhosis Brother        chronic viral hepatitis   Colon cancer Neg Hx      Social History Mr. Mercadel reports that he quit smoking about 25 years ago. His smoking use included cigarettes. He started smoking about 65 years ago. He has a 60 pack-year smoking history. He has never been exposed to tobacco smoke. He has never used smokeless tobacco. Mr. Nankervis reports no history of alcohol use.   Review of Systems CONSTITUTIONAL: No weight loss, fever, chills, weakness or fatigue.  HEENT: Eyes: No visual loss, blurred vision,  double vision or yellow sclerae.No hearing loss, sneezing, congestion, runny nose or sore throat.  SKIN: No rash or itching.  CARDIOVASCULAR: per hpi RESPIRATORY: per hpi GASTROINTESTINAL: No  anorexia, nausea, vomiting or diarrhea. No abdominal pain or blood.  GENITOURINARY: No burning on urination, no polyuria NEUROLOGICAL: No headache, dizziness, syncope, paralysis, ataxia, numbness or tingling in the extremities. No change in bowel or bladder control.  MUSCULOSKELETAL: No muscle, back pain, joint pain or stiffness.  LYMPHATICS: No enlarged nodes. No history of splenectomy.  PSYCHIATRIC: No history of depression or anxiety.  ENDOCRINOLOGIC: No reports of sweating, cold or heat intolerance. No polyuria or polydipsia.  Marland Kitchen   Physical Examination Today's Vitals   05/11/23 1418  BP: 122/68  Pulse: (!) 126  SpO2: 98%  Weight: 145 lb 12.8 oz (66.1 kg)  Height: 5\' 6"  (1.676 m)   Body mass index is 23.53 kg/m.  Gen: resting comfortably, no acute distress HEENT: no scleral icterus, pupils equal round and reactive, no palptable cervical adenopathy,  CV: RRR, 3/6 ssystolic murmur rusb, no jvd Resp: Clear to auscultation bilaterally GI: abdomen is soft, non-tender, non-distended, normal bowel sounds, no hepatosplenomegaly MSK: extremities are warm, no edema.  Skin: warm, no rash Neuro:  no focal deficits Psych: appropriate affect   Diagnostic Studies  07/2012 Echo LVEF 55-60%, no WMA, mild-mod AS (mean grad 11, AVA by planimetry 1.2, continuity area not listed)   07/2014 echo Study Conclusions  - Left ventricle: The cavity size was normal. Wall thickness was   increased in a pattern of mild LVH. Systolic function was normal.   The estimated ejection fraction was in the range of 60% to 65%.   Wall motion was normal; there were no regional wall motion   abnormalities. Doppler parameters are consistent with abnormal   left ventricular relaxation (grade 1 diastolic dysfunction). - Ventricular septum: Septal motion showed abnormal function and   dyssynergy. - Aortic valve: Moderately calcified annulus. Trileaflet;   moderately calcified leaflets. Cusp separation was  moderately   reduced. There was moderate stenosis. There was mild   regurgitation. Mean gradient (S): 18 mm Hg. Peak gradient (S): 32   mm Hg. VTI ratio of LVOT to aortic valve: 0.43. Valve area (VTI):   1.25 cm^2. Planimetry valve area 1.5 cm2. - Mitral valve: Moderately calcified annulus. There was trivial   regurgitation. - Left atrium: The atrium was mildly dilated. - Right atrium: Central venous pressure (est): 3 mm Hg. - Atrial septum: No defect or patent foramen ovale was identified. - Tricuspid valve: There was trivial regurgitation. - Pulmonary arteries: Systolic pressure could not be accurately   estimated. - Pericardium, extracardiac: There was no pericardial effusion.     07/2016 echo Study Conclusions   - Left ventricle: The cavity size was normal. Wall thickness was   increased in a pattern of mild LVH. Systolic function was normal.   The estimated ejection fraction was in the range of 60% to 65%.   Wall motion was normal; there were no regional wall motion   abnormalities. - Aortic valve: Moderately calcified annulus. Moderately calcified   leaflets. There was mild regurgitation. Mean gradient (S): 18 mm   Hg. Peak gradient (S): 36 mm Hg. VTI ratio of LVOT to aortic   valve: 0.38. Valve area (VTI): 0.77 cm^2. Somewhat discordant   information in grading degree of aortic stenosis. Gradients   suggest mild range, however valve appearance and LVOT to AV   ratios are more consistent with moderate to  severe aortic   stenosis. - Mitral valve: Moderately calcified annulus. There was trivial   regurgitation. - Right atrium: Central venous pressure (est): 3 mm Hg. - Atrial septum: No defect or patent foramen ovale was identified. - Tricuspid valve: There was trivial regurgitation. - Pulmonary arteries: Systolic pressure could not be accurately   estimated. - Pericardium, extracardiac: A prominent pericardial fat pad was   present.   Impressions:   - Mild LVH with LVEF  60-65%. Grade 2 diastolic dysfunction   suspected. Aortic valve is moderately calcified with at least   moderate calcific aortic stenosis as detailed above, information   is somewhat discordant however. Mild aortic regurgitation.   Moderate mitral annular calcification with trivial mitral   regurgitation. Trivial tricuspid regurgitation.   06/2017 echo Study Conclusions   - Left ventricle: The cavity size was normal. There was severe   concentric hypertrophy. Systolic function was normal. The   estimated ejection fraction was in the range of 60% to 65%. Wall   motion was normal; there were no regional wall motion   abnormalities. Features are consistent with a pseudonormal left   ventricular filling pattern, with concomitant abnormal relaxation   and increased filling pressure (grade 2 diastolic dysfunction).   Doppler parameters are consistent with high ventricular filling   pressure. - Ventricular septum: Septal motion showed abnormal function and   dyssynergy. 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 - Aortic valve: Moderately to severely calcified annulus.   Trileaflet; moderately calcified leaflets. There was moderate to   severe stenosis. There was mild regurgitation. Peak velocity (S):   325 cm/s. Mean gradient (S): 22 mm Hg. Valve area (VTI): 1 cm^2.   Valve area (Vmax): 0.9 cm^2. Valve area (Vmean): 0.99 cm^2. - Mitral valve: Moderately calcified annulus. - Right ventricle: Systolic function was mildly reduced. - Atrial septum: No defect or patent foramen ovale was identified.   07/2018 echo IMPRESSIONS     1. The left ventricle has hyperdynamic systolic function, with an  ejection fraction of >65%. There is moderate asymmetric septal left  ventricular hypertrophy. Diastolic parameters are consistent with  restrictive filling.   2. The right ventricle has normal systolic function. The cavity was  normal. There is no increase in right ventricular  wall thickness. Right  ventricular systolic pressure could not be assessed.   3. The mitral valve is normal in structure. There is moderate mitral  annular calcification present.   4. The aortic root is normal in size and structure.   5. The tricuspid valve is normal in structure.   6. Aortic valve is possibly tricuspid, but leaflet structure not well  defined. Aortic valve regurgitation is mild by color flow Doppler. There  is moderate to more likely severe aortic stenosis with LVOT/AV VTI 0.33  and calculated valve area 0.83 cm2.  Gradients suggest paradoxical low gradient stenosis in the setting of  normal LVEF.      12/2021 echo 1. The aortic valve is tricuspid. There is severe calcifcation of the  aortic valve. There is severe thickening of the aortic valve. Aortic valve  regurgitation is mild. Severe aortic valve stenosis. Aortic valve mean  gradient measures 35.0 mmHg. Aortic  valve Vmax measures 3.90 m/s. AVA by continuity 0.82cm2, DI 0.24.   2. Left ventricular ejection fraction, by estimation, is 60 to 65%. The  left ventricle has normal function. The left ventricle has no regional  wall motion abnormalities. There is moderate concentric left  ventricular  hypertrophy. Diastolic function  indeterminant due to severe MAC.   3. Right ventricular systolic function is normal. The right ventricular  size is normal.   4. Left atrial size was moderately dilated.   5. The mitral valve is degenerative. Trivial mitral valve regurgitation.  Mild calcific mitral stenosis. The mean mitral valve gradient is 4.0 mmHg  at HR 74bpm. Severe mitral annular calcification.   6. The inferior vena cava is normal in size with greater than 50%  respiratory variability, suggesting right atrial pressure of 3 mmHg.     02/2023 echo 1. Left ventricular ejection fraction, by estimation, is 60 to 65%. The  left ventricle has normal function. The left ventricle has no regional  wall motion  abnormalities. There is moderate left ventricular hypertrophy.  Left ventricular diastolic  parameters are consistent with Grade I diastolic dysfunction (impaired  relaxation). Elevated left atrial pressure.   2. Right ventricular systolic function is normal. The right ventricular  size is normal. Tricuspid regurgitation signal is inadequate for assessing  PA pressure.   3. The mitral valve is abnormal. Mild mitral valve regurgitation. No  evidence of mitral stenosis. The mean mitral valve gradient is 3.0 mmHg.HR  65 bpm. Moderate mitral annular calcification.   4. The aortic valve was not well visualized. There is severe calcifcation  of the aortic valve. There is severe thickening of the aortic valve.  Aortic valve regurgitation is mild. Severe aortic valve stenosis. Aortic  valve mean gradient measures 39.0  mmHg. Aortic valve peak gradient measures 50.1 mmHg. Aortic valve area, by  VTI measures 0.93 cm.   5. The inferior vena cava is normal in size with greater than 50%  respiratory variability, suggesting right atrial pressure of 3 mmHg.    Assessment and Plan   1. Aortic stenosis - severe AS by echo, normal LVEF. No reproted symptoms but exertion is limited due to recent vertebral fracture wearing a brace and chronic neuropathy. Highest level of activity is walking with his walker at steady pace for 10 minutes around rehab facility, tolerates without symptoms.  - continue to monitor at this time, repeat echo in spring. Lung mass/cancer would affect future considerations for AVR if he progressed from a valve standpoint.    2. Chronic sinus tachycardia - EKG shows sinus tach, chronic LBBB - long history chronic sinus tach particularly in medical setting   3. Preoperative evaluation - increased but not prohibitive risk for bronchoscopy with biopsy under general anesthesia. Given the neccesity for tissue diagnosis would recommend proceeding with procedure from cardiac standpoint.  Severe AS but normal LVEF, has not been symptomatic though exertion is limited for other medical reasons.      F/u 3 months with me     Antoine Poche, M.D.

## 2023-05-12 ENCOUNTER — Telehealth: Payer: Self-pay | Admitting: Pulmonary Disease

## 2023-05-12 ENCOUNTER — Encounter: Payer: Self-pay | Admitting: *Deleted

## 2023-05-12 NOTE — Telephone Encounter (Signed)
Patient has procedure scheduled for Thursday December 19th and needs to know pre-admission  instructions. Call back number [phone]

## 2023-05-13 ENCOUNTER — Encounter (HOSPITAL_COMMUNITY): Payer: Self-pay | Admitting: Pulmonary Disease

## 2023-05-13 ENCOUNTER — Other Ambulatory Visit: Payer: Self-pay

## 2023-05-13 NOTE — Progress Notes (Addendum)
SDW CALL  Patient not available.  Pre-op instructions given over the phone to patient's son,Ritchie Calarco. The opportunity was given for the patient's son to ask questions. No further questions asked. Mr. Refuerzo verbalized understanding of instructions given.He stated that [employer] had been scheduled to bring his father to the hospital tomorrow and they would be arriving at the hospital between 0640 and 0700. I informed Mr. Garvey that his father needs to arrive at the hospital at 0530 to be prepped for his procedure. I asked him to reschedule the [employer] pick up for an earlier time if possible. I will inform Dr. Candis Musa office and the Short Stay charge nurse of the transportation issue. Dr. Larinda Buttery aware of pt's transportation issues   PCP - Kathleene Hazel. Margo Aye, MD Cardiologist - Karna Dupes- pre op eval done 05/11/23  PPM/ICD - denies Device Orders -  Rep Notified -   Chest x-ray - chest CT EKG - 05/11/23 Stress Test - denies ECHO - 03/11/23 Cardiac Cath - denies  Sleep Study - denies CPAP -no   Fasting Blood Sugar - na Checks Blood Sugar _____ times a day  Blood Thinner Instructions:na Aspirin Instructions:na  ERAS Protcol -no PRE-SURGERY Ensure or G2-   COVID TEST- na   Anesthesia review: no  Patient's son denies that patient is having  shortness of breath, fever, cough and chest pain over the phone call    Surgical Instructions    Your procedure is scheduled on December 19  Report to [facility] Main Entrance "A" at 0530 A.M., then check in with the Admitting office.  Call this number if you have problems the morning of surgery:  [phone]    Remember:  Do not eat or drink anything after midnight the night before your surgery    Take these medicines the morning of surgery with A SIP OF WATER: Cymbalta,Proscar,Synthroid,Protonix,Rapaflo. PRN- Tramadol,Oxycodone,Robaxin,Antivert,zovirax,Tylenol.  As of today, STOP taking any  Aspirin (unless otherwise instructed by your surgeon) Aleve, Naproxen, Ibuprofen, Motrin, Advil, Goody's, BC's, all herbal medications, fish oil, and all vitamins.  [facility] is not responsible for any belongings or valuables. .   Do NOT Smoke (Tobacco/Vaping)  24 hours prior to your procedure  If you use a CPAP at night, you may bring your mask for your overnight stay.   Contacts, glasses, hearing aids, dentures or partials may not be worn into surgery, please bring cases for these belongings   Patients discharged the day of surgery will not be allowed to drive home, and someone needs to stay with them for 24 hours.   Special instructions:    Oral Hygiene is also important to reduce your risk of infection.  Remember - BRUSH YOUR TEETH THE MORNING OF SURGERY WITH YOUR REGULAR TOOTHPASTE   Day of Surgery:  Take a shower the day of or night before with antibacterial soap. Wear Clean/Comfortable clothing the morning of surgery Do not apply any deodorants/lotions.   Do not wear jewelry or makeup Do not wear lotions, powders, perfumes/colognes, or deodorant. Do not shave 48 hours prior to surgery.  Men may shave face and neck. Do not bring valuables to the hospital. Do not wear nail polish, gel polish, artificial nails, or any other type of covering on natural nails (fingers and toes) If you have artificial nails or gel coating that need to be removed by a nail salon, please have this removed prior to surgery. Artificial nails or gel coating may interfere with anesthesia's ability to adequately monitor  your vital signs. Remember to brush your teeth WITH YOUR REGULAR TOOTHPASTE.

## 2023-05-14 ENCOUNTER — Ambulatory Visit (HOSPITAL_COMMUNITY): Payer: Medicare Other

## 2023-05-14 ENCOUNTER — Ambulatory Visit (HOSPITAL_COMMUNITY)
Admission: RE | Admit: 2023-05-14 | Discharge: 2023-05-14 | Disposition: A | Discharge status: Home / Self Care | Payer: Medicare Other | Attending: Pulmonary Disease | Admitting: Pulmonary Disease

## 2023-05-14 ENCOUNTER — Telehealth: Payer: Self-pay

## 2023-05-14 ENCOUNTER — Other Ambulatory Visit: Payer: Self-pay

## 2023-05-14 ENCOUNTER — Encounter (HOSPITAL_COMMUNITY)
Admission: RE | Disposition: A | Discharge status: Home / Self Care | Payer: Self-pay | Source: Home / Self Care | Attending: Pulmonary Disease

## 2023-05-14 ENCOUNTER — Encounter (HOSPITAL_COMMUNITY): Payer: Self-pay | Admitting: Pulmonary Disease

## 2023-05-14 DIAGNOSIS — I129 Hypertensive chronic kidney disease with stage 1 through stage 4 chronic kidney disease, or unspecified chronic kidney disease: Secondary | ICD-10-CM | POA: Diagnosis not present

## 2023-05-14 DIAGNOSIS — C3412 Malignant neoplasm of upper lobe, left bronchus or lung: Secondary | ICD-10-CM | POA: Insufficient documentation

## 2023-05-14 DIAGNOSIS — R911 Solitary pulmonary nodule: Secondary | ICD-10-CM

## 2023-05-14 DIAGNOSIS — E039 Hypothyroidism, unspecified: Secondary | ICD-10-CM

## 2023-05-14 DIAGNOSIS — I10 Essential (primary) hypertension: Secondary | ICD-10-CM

## 2023-05-14 DIAGNOSIS — I083 Combined rheumatic disorders of mitral, aortic and tricuspid valves: Secondary | ICD-10-CM | POA: Insufficient documentation

## 2023-05-14 DIAGNOSIS — N189 Chronic kidney disease, unspecified: Secondary | ICD-10-CM | POA: Diagnosis not present

## 2023-05-14 DIAGNOSIS — I447 Left bundle-branch block, unspecified: Secondary | ICD-10-CM | POA: Diagnosis not present

## 2023-05-14 DIAGNOSIS — Z8546 Personal history of malignant neoplasm of prostate: Secondary | ICD-10-CM | POA: Diagnosis not present

## 2023-05-14 DIAGNOSIS — Z87891 Personal history of nicotine dependence: Secondary | ICD-10-CM | POA: Diagnosis not present

## 2023-05-14 DIAGNOSIS — K219 Gastro-esophageal reflux disease without esophagitis: Secondary | ICD-10-CM | POA: Insufficient documentation

## 2023-05-14 HISTORY — PX: BRONCHIAL BIOPSY: SHX5109

## 2023-05-14 HISTORY — PX: BRONCHIAL NEEDLE ASPIRATION BIOPSY: SHX5106

## 2023-05-14 HISTORY — DX: Personal history of urinary calculi: Z87.442

## 2023-05-14 LAB — BASIC METABOLIC PANEL
Anion gap: 7 (ref 5–15)
BUN: 8 mg/dL (ref 8–23)
CO2: 30 mmol/L (ref 22–32)
Calcium: 8.9 mg/dL (ref 8.9–10.3)
Chloride: 100 mmol/L (ref 98–111)
Creatinine, Ser: 0.94 mg/dL (ref 0.61–1.24)
GFR, Estimated: >60 mL/min (ref >60)
Glucose, Bld: 151 mg/dL — ABNORMAL HIGH (ref 70–99)
Potassium: 4.6 mmol/L (ref 3.5–5.1)
Sodium: 137 mmol/L (ref 135–145)

## 2023-05-14 LAB — CBC
HCT: 39.7 % (ref 39.0–52.0)
Hemoglobin: 12.6 g/dL — ABNORMAL LOW (ref 13.0–17.0)
MCH: 29 pg (ref 26.0–34.0)
MCHC: 31.7 g/dL (ref 30.0–36.0)
MCV: 91.5 fL (ref 80.0–100.0)
Platelets: 324 10*3/uL (ref 150–400)
RBC: 4.34 MIL/uL (ref 4.22–5.81)
RDW: 12 % (ref 11.5–15.5)
WBC: 11.1 10*3/uL — ABNORMAL HIGH (ref 4.0–10.5)
nRBC: 0 % (ref 0.0–0.2)

## 2023-05-14 SURGERY — BRONCHOSCOPY, WITH BIOPSY USING ELECTROMAGNETIC NAVIGATION
Anesthesia: General | Laterality: Bilateral

## 2023-05-14 MED ORDER — PHENYLEPHRINE HCL-NACL 20-0.9 MG/250ML-% IV SOLN
INTRAVENOUS | Status: DC | PRN
  Administered 2023-05-14: 30 ug/min via INTRAVENOUS

## 2023-05-14 MED ORDER — ONDANSETRON HCL 4 MG/2ML IJ SOLN
INTRAMUSCULAR | Status: DC | PRN
  Administered 2023-05-14: 4 mg via INTRAVENOUS

## 2023-05-14 MED ORDER — SUGAMMADEX SODIUM 200 MG/2ML IV SOLN
INTRAVENOUS | Status: DC | PRN
  Administered 2023-05-14: 200 mg via INTRAVENOUS

## 2023-05-14 MED ORDER — SODIUM CHLORIDE 0.9 % IV SOLN: INTRAVENOUS | Status: DC

## 2023-05-14 MED ORDER — ACETAMINOPHEN 160 MG/5ML PO SOLN: 1000.0000 mg | Freq: Once | ORAL | Status: DC | PRN

## 2023-05-14 MED ORDER — DEXAMETHASONE SODIUM PHOSPHATE 10 MG/ML IJ SOLN
INTRAMUSCULAR | Status: DC | PRN
  Administered 2023-05-14: 8 mg via INTRAVENOUS

## 2023-05-14 MED ORDER — ESMOLOL HCL 100 MG/10ML IV SOLN
INTRAVENOUS | Status: DC | PRN
  Administered 2023-05-14: 20 mg via INTRAVENOUS

## 2023-05-14 MED ORDER — PHENYLEPHRINE 80 MCG/ML (10ML) SYRINGE FOR IV PUSH (FOR BLOOD PRESSURE SUPPORT)
PREFILLED_SYRINGE | INTRAVENOUS | Status: DC | PRN
  Administered 2023-05-14 (×4): 80 ug via INTRAVENOUS

## 2023-05-14 MED ORDER — PROPOFOL 10 MG/ML IV BOLUS
INTRAVENOUS | Status: DC | PRN
  Administered 2023-05-14: 125 ug/kg/min via INTRAVENOUS
  Administered 2023-05-14: 80 mg via INTRAVENOUS
  Administered 2023-05-14: 20 mg via INTRAVENOUS

## 2023-05-14 MED ORDER — CHLORHEXIDINE GLUCONATE 0.12 % MT SOLN
OROMUCOSAL | Status: AC
  Administered 2023-05-14: 15 mL via OROMUCOSAL
  Filled 2023-05-14: qty 15

## 2023-05-14 MED ORDER — CHLORHEXIDINE GLUCONATE 0.12 % MT SOLN: 15.0000 mL | Freq: Once | OROMUCOSAL | Status: AC

## 2023-05-14 MED ORDER — ROCURONIUM BROMIDE 10 MG/ML (PF) SYRINGE
PREFILLED_SYRINGE | INTRAVENOUS | Status: DC | PRN
  Administered 2023-05-14: 40 mg via INTRAVENOUS

## 2023-05-14 MED ORDER — LIDOCAINE 2% (20 MG/ML) 5 ML SYRINGE
INTRAMUSCULAR | Status: DC | PRN
  Administered 2023-05-14: 50 mg via INTRAVENOUS

## 2023-05-14 MED ORDER — ACETAMINOPHEN 10 MG/ML IV SOLN: 1000.0000 mg | Freq: Once | INTRAVENOUS | Status: DC | PRN

## 2023-05-14 MED ORDER — ACETAMINOPHEN 500 MG PO TABS: 1000.0000 mg | ORAL_TABLET | Freq: Once | ORAL | Status: DC | PRN

## 2023-05-14 MED ORDER — SODIUM CHLORIDE 0.9 % IV SOLN: INTRAVENOUS | Status: DC | PRN

## 2023-05-14 NOTE — H&P (Signed)
Calvin Moreno is an 82 year old male patient with a past medical history of prostate cancer s/p XRT in 2012, hx of tobacco use, peripheral neuropathy on chronic hydrocodone, severe aortic stenosis ASA 0.93cm2 mean gradient  and recent fall c/b L4 compression fracture now in braces presenting today for a Robotic assisted Navigational bronchoscopy.   He was found to have a LUL 2.4cm Nodule on on CT chest done in Novemebr. FDG avid on PET scan. Highly concerning for malignancy.   Feeling well today. No major complaints. Confirmed being off anticoagulation or antiplatelets.   Physical exam GEN NAD  HEENT Supple neck, reactive pupils  CVS Nomral S1, Faint S2, RRR  Lungs Clear  Abdomen Soft non tender non distended  Extremities Warm and well perfused.   Labs and imaging reviewed.   A&P   Calvin Moreno is an 82 year old male patient with a past medical history of prostate cancer s/p XRT in 2012, hx of tobacco use, peripheral neuropathy on chronic hydrocodone, severe aortic stenosis ASA 0.93cm2 mean gradient  and recent fall c/b L4 compression fracture now in braces presenting today for a Robotic assisted Navigational bronchoscopy.   #LUL 2.4cm FDG Avid   []  RANB - EBUS TBNA.   Janann Colonel, MD [facility] 05/14/2023 7:41 AM

## 2023-05-14 NOTE — Anesthesia Procedure Notes (Signed)
Procedure Name: Intubation Date/Time: 05/14/2023 8:17 AM  Performed by: Pincus Large, CRNAPre-anesthesia Checklist: Patient identified, Emergency Drugs available, Suction available and Patient being monitored Patient Re-evaluated:Patient Re-evaluated prior to induction Oxygen Delivery Method: Circle System Utilized Preoxygenation: Pre-oxygenation with 100% oxygen Induction Type: IV induction Ventilation: Mask ventilation without difficulty Laryngoscope Size: Mac and 4 Grade View: Grade II Tube type: Oral Tube size: 8.5 mm Number of attempts: 1 Airway Equipment and Method: Stylet and Oral airway Placement Confirmation: ETT inserted through vocal cords under direct vision, positive ETCO2 and breath sounds checked- equal and bilateral Secured at: 23 cm Tube secured with: Tape Dental Injury: Teeth and Oropharynx as per pre-operative assessment

## 2023-05-14 NOTE — Op Note (Signed)
Video Bronchoscopy with Robotic Assisted Bronchoscopic Navigation   Date of Operation: 05/14/2023   Pre-op Diagnosis: Left upper lobe nodule   Post-op Diagnosis: Same as pre   Surgeon: Calah Gershman/Dgayli  Anesthesia: General endotracheal anesthesia  Operation: Flexible video fiberoptic bronchoscopy with robotic assistance and biopsies.  Estimated Blood Loss: Minimal  Complications: None  Indications and History: Calvin Moreno is a 82 y.o. male with history of Left upper lobe nodule. The risks, benefits, complications, treatment options and expected outcomes were discussed with the patient.  The possibilities of pneumothorax, pneumonia, reaction to medication, pulmonary aspiration, perforation of a viscus, bleeding, failure to diagnose a condition and creating a complication requiring transfusion or operation were discussed with the patient who freely signed the consent.    Description of Procedure: The patient was seen in the Preoperative Area, was examined and was deemed appropriate to proceed. The patient was taken to [facility] endoscopy room MC3, identified as Calvin Moreno and the procedure verified as Flexible Video Fiberoptic Bronchoscopy.  A Time Out was held and the above information confirmed.   Prior to the date of the procedure a high-resolution CT scan of the chest was performed. Utilizing ION software program a virtual tracheobronchial tree was generated to allow the creation of distinct navigation pathways to the patient's parenchymal abnormalities. After being taken to the operating room general anesthesia was initiated and the patient  was orally intubated. The video fiberoptic bronchoscope was introduced via the endotracheal tube and a general inspection was performed which showed normal right and left lung anatomy, aspiration of the bilateral mainstems was completed to remove any remaining secretions. Robotic catheter inserted into patient's endotracheal tube.   Target #1 Left  upper lobe nodule: The distinct navigation pathways prepared prior to this procedure were then utilized to navigate to patient's lesion identified on CT scan. The robotic catheter was secured into place and the vision probe was withdrawn. Lesion location was approximated using 3D fluoroscopy (Siemens Cospin) and Under fluoroscopic guidance transbronchial needle biopsies, and transbronchial forceps biopsies were performed to be sent for cytology and pathology.   We then introduced the EBUS scope and station 7, 4L, 11L were evaluated and biopsied.   At the end of the procedure a general airway inspection was performed and there was no evidence of active bleeding. The bronchoscope was removed.  The patient tolerated the procedure well. There was no significant blood loss and there were no obvious complications. A post-procedural chest x-ray is pending.  Samples Target #1:  1. Transbronchial needle biopsies from LUL nodule. 2. Transbronchial forceps biopsies from LUL nodule.  EBUS-TBNA:  Station 7 Station 4L Station 11L   Plans:  The patient will be discharged from the PACU to home when recovered from anesthesia and after chest x-ray is reviewed. We will review the cytology, pathology and microbiology results with the patient when they become available. Outpatient followup will be with Dr. Larinda Buttery.  Janann Colonel, MD [facility] 05/14/2023 9:43 AM

## 2023-05-14 NOTE — Anesthesia Preprocedure Evaluation (Signed)
Anesthesia Evaluation  Patient identified by MRN, date of birth, ID band Patient awake    Reviewed: Allergy & Precautions, NPO status , Patient's Chart, lab work & pertinent test results  History of Anesthesia Complications Negative for: history of anesthetic complications  Airway Mallampati: III  TM Distance: >3 FB Neck ROM: Full    Dental  (+) Teeth Intact, Dental Advisory Given   Pulmonary shortness of breath, former smoker   breath sounds clear to auscultation       Cardiovascular hypertension, Pt. on medications (-) angina + Peripheral Vascular Disease  + dysrhythmias + Valvular Problems/Murmurs AS  Rhythm:Regular + Systolic murmurs  1. Left ventricular ejection fraction, by estimation, is 60 to 65%. The  left ventricle has normal function. The left ventricle has no regional  wall motion abnormalities. There is moderate left ventricular hypertrophy.  Left ventricular diastolic  parameters are consistent with Grade I diastolic dysfunction (impaired  relaxation). Elevated left atrial pressure.   2. Right ventricular systolic function is normal. The right ventricular  size is normal. Tricuspid regurgitation signal is inadequate for assessing  PA pressure.   3. The mitral valve is abnormal. Mild mitral valve regurgitation. No  evidence of mitral stenosis. The mean mitral valve gradient is 3.0 mmHg.HR  65 bpm. Moderate mitral annular calcification.   4. The aortic valve was not well visualized. There is severe calcifcation  of the aortic valve. There is severe thickening of the aortic valve.  Aortic valve regurgitation is mild. Severe aortic valve stenosis. Aortic  valve mean gradient measures 39.0  mmHg. Aortic valve peak gradient measures 50.1 mmHg. Aortic valve area, by  VTI measures 0.93 cm.   5. The inferior vena cava is normal in size with greater than 50%  respiratory variability, suggesting right atrial pressure of 3  mmHg.     Neuro/Psych  Lung nodule  Neuromuscular disease CVA, No Residual Symptoms  negative psych ROS   GI/Hepatic Neg liver ROS, hiatal hernia, PUD,GERD  Medicated,,  Endo/Other  Hypothyroidism    Renal/GU Renal diseaseLab Results      Component                Value               Date                      NA                       137                 05/14/2023                K                        4.6                 05/14/2023                CO2                      30                  05/14/2023                GLUCOSE                  151 (H)  05/14/2023                BUN                      8                   05/14/2023                CREATININE               0.94                05/14/2023                CALCIUM                  8.9                 05/14/2023                EGFR                     85                  04/06/2023                GFRNONAA                 >60                 05/14/2023                Musculoskeletal  (+) Arthritis ,    Abdominal   Peds  Hematology Lab Results      Component                Value               Date                      WBC                      11.1 (H)            05/14/2023                HGB                      12.6 (L)            05/14/2023                HCT                      39.7                05/14/2023                MCV                      91.5                05/14/2023                PLT                      324                 05/14/2023  Anesthesia Other Findings   Reproductive/Obstetrics                              Anesthesia Physical Anesthesia Plan  ASA: 4  Anesthesia Plan: General   Post-op Pain Management: Minimal or no pain anticipated   Induction: Intravenous  PONV Risk Score and Plan: 2 and Ondansetron, Dexamethasone, Propofol infusion and TIVA  Airway Management Planned: Oral ETT  Additional Equipment: ClearSight  Intra-op Plan:    Post-operative Plan: Extubation in OR  Informed Consent: I have reviewed the patients History and Physical, chart, labs and discussed the procedure including the risks, benefits and alternatives for the proposed anesthesia with the patient or authorized representative who has indicated his/her understanding and acceptance.     Dental advisory given  Plan Discussed with: CRNA  Anesthesia Plan Comments:          Anesthesia Quick Evaluation

## 2023-05-14 NOTE — Telephone Encounter (Signed)
Per secure chat from Dr. Larinda Buttery, can we please place a referral to Heme Onc urgent. He had an adequate biopsy.   Referral has been placed. Nothing further needed.

## 2023-05-14 NOTE — Transfer of Care (Signed)
Immediate Anesthesia Transfer of Care Note  Patient: [name]  Procedure(s) Performed: ROBOTIC ASSISTED NAVIGATIONAL BRONCHOSCOPY, ENDOBRONCHIAL ULTRASOUND (Bilateral) BRONCHIAL NEEDLE ASPIRATION BIOPSIES BRONCHIAL BIOPSIES  Patient Location: PACU  Anesthesia Type:General  Level of Consciousness: awake, alert , oriented, and sedated  Airway & Oxygen Therapy: Patient Spontanous Breathing and Patient connected to face mask oxygen  Post-op Assessment: Report given to RN and Post -op Vital signs reviewed and stable  Post vital signs: Reviewed and stable  Last Vitals:  Vitals Value Taken Time  BP 119/58 05/14/23 0932  Temp 97.5   Pulse 91 05/14/23 0934  Resp 27 05/14/23 0934  SpO2 100 % 05/14/23 0934  Vitals shown include unfiled device data.  Last Pain:  Vitals:   05/14/23 0614  TempSrc:   PainSc: 5       Patients Stated Pain Goal: 2 (05/14/23 4098)  Complications: No notable events documented.

## 2023-05-15 ENCOUNTER — Encounter (HOSPITAL_COMMUNITY): Payer: Self-pay | Admitting: Pulmonary Disease

## 2023-05-15 LAB — CYTOLOGY - NON PAP

## 2023-05-17 NOTE — Anesthesia Postprocedure Evaluation (Signed)
Anesthesia Post Note  Patient: [name]  Procedure(s) Performed: ROBOTIC ASSISTED NAVIGATIONAL BRONCHOSCOPY, ENDOBRONCHIAL ULTRASOUND (Bilateral) BRONCHIAL NEEDLE ASPIRATION BIOPSIES BRONCHIAL BIOPSIES     Patient location during evaluation: PACU Anesthesia Type: General Level of consciousness: awake and alert Pain management: pain level controlled Vital Signs Assessment: post-procedure vital signs reviewed and stable Respiratory status: spontaneous breathing, nonlabored ventilation and respiratory function stable Cardiovascular status: blood pressure returned to baseline and stable Postop Assessment: no apparent nausea or vomiting Anesthetic complications: no   No notable events documented.  Last Vitals:  Vitals:   05/14/23 0945 05/14/23 1000  BP: (!) 107/58 110/60  Pulse: (!) 101 99  Resp: 18 (!) 23  Temp:  37.3 C  SpO2: 95% 94%    Last Pain:  Vitals:   05/14/23 1000  TempSrc:   PainSc: 0-No pain                 Sherise Geerdes

## 2023-05-18 NOTE — Telephone Encounter (Signed)
Pt son calling in bc the result from the procedure has come back and would like to make sure what he seeing In MyChart is the full report b/c it is missing the results from one of the specimens taken.

## 2023-05-19 LAB — CYTOLOGY - NON PAP

## 2023-05-28 ENCOUNTER — Other Ambulatory Visit: Payer: Self-pay

## 2023-05-29 NOTE — Progress Notes (Signed)
 The proposed treatment discussed in conference is for discussion purpose only and is not a binding recommendation.  The patients have not been physically examined, or presented with their treatment options.  Therefore, final treatment plans cannot be decided.

## 2023-05-31 DIAGNOSIS — C3412 Malignant neoplasm of upper lobe, left bronchus or lung: Secondary | ICD-10-CM | POA: Insufficient documentation

## 2023-06-01 ENCOUNTER — Inpatient Hospital Stay: Payer: Medicare Other | Attending: Hematology | Admitting: Hematology

## 2023-06-01 ENCOUNTER — Inpatient Hospital Stay: Payer: Medicare Other

## 2023-06-01 ENCOUNTER — Encounter: Payer: Self-pay | Admitting: Hematology

## 2023-06-01 VITALS — BP 132/69 | HR 119 | Temp 97.8°F | Resp 20 | Wt 144.0 lb

## 2023-06-01 DIAGNOSIS — Z8546 Personal history of malignant neoplasm of prostate: Secondary | ICD-10-CM | POA: Diagnosis present

## 2023-06-01 DIAGNOSIS — C3490 Malignant neoplasm of unspecified part of unspecified bronchus or lung: Secondary | ICD-10-CM

## 2023-06-01 DIAGNOSIS — C3412 Malignant neoplasm of upper lobe, left bronchus or lung: Secondary | ICD-10-CM | POA: Diagnosis present

## 2023-06-01 NOTE — Patient Instructions (Addendum)
 [name]  Discharge Instructions  You were seen and examined today by Dr. Rogers. Dr. Katragadda is a medical oncologist, meaning that he specializes in the treatment of cancer diagnoses. Dr. Rogers discussed your past medical history, family history of cancers, and the events that led to you being here today.  You were referred to Dr. Rogers due to a new diagnosis of Stage I squamous cell lung cancer. The cancer is contained to the lung, there is no spread of the cancer beyond the lung - this is good news!  Dr. Katragadda has recommended a brain MRI to complete the staging work-up. Dr. Rogers has also recommended obtaining pulmonary function tests, this is done to see how well your lungs are currently functioning.   The best option for cure is surgery, but as you are not interested in that - we will refer you to Radiation Oncology in [place] to see if they can manage it with radiation in [place].  Follow-up as scheduled.  Thank you for choosing [name] to provide your oncology and hematology care.   To afford each patient quality time with our provider, please arrive at least 15 minutes before your scheduled appointment time. You may need to reschedule your appointment if you arrive late (10 or more minutes). Arriving late affects you and other patients whose appointments are after yours.  Also, if you miss three or more appointments without notifying the office, you may be dismissed from the clinic at the provider's discretion.    Again, thank you for choosing [name].  Our hope is that these requests will decrease the amount of time that you wait before being seen by our physicians.   If you have a lab appointment with the Cancer Center - please note that after April 8th, all labs will be drawn in the cancer center.  You do not have to check in or register with the main entrance as you have in the past but  will complete your check-in at the cancer center.            _____________________________________________________________  Should you have questions after your visit to [name], please contact our office at [phone] and follow the prompts.  Our office hours are 8:00 a.m. to 4:30 p.m. Monday - Thursday and 8:00 a.m. to 2:30 p.m. Friday.  Please note that voicemails left after 4:00 p.m. may not be returned until the following business day.  We are closed weekends and all major holidays.  You do have access to a nurse 24-7, just call the main number to the clinic [phone] and do not press any options, hold on the line and a nurse will answer the phone.    For prescription refill requests, have your pharmacy contact our office and allow 72 hours.    Masks are no longer required in the cancer centers. If you would like for your care team to wear a mask while they are taking care of you, please let them know. You may have one support person who is at least 85 years old accompany you for your appointments.

## 2023-06-01 NOTE — Progress Notes (Signed)
 [facility] [address] [place] [address]   Clinic Day:  06/01/2023  Referring physician: Shona Norleen PEDLAR, MD  Patient Care Team: Shona Norleen PEDLAR, MD as PCP - General (Internal Medicine) Alvan Dorn FALCON, MD as PCP - Cardiology (Cardiology) Shaaron Lamar HERO, MD (Gastroenterology) Rogers Hai, MD as Medical Oncologist (Medical Oncology) Celestia Joesph SQUIBB, RN as Oncology Nurse Navigator (Medical Oncology)   ASSESSMENT & PLAN:   Assessment:  1.  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  Spent 6 weeks in rehab in [place]. - CT chest as part of workup showed 2.4 cm left upper lobe spiculated nodule. - PET scan (04/28/2023): Irregular 2.3 x 1.7 cm anterior left upper lobe nodule with SUV 13.1.  1 cm subcarinal lymph node with maximum SUV 3.6, mildly above blood pool. - Bronchoscopy and biopsy by Dr. Malka - Pathology (05/14/2023): LUL FNA squamous cell carcinoma.  FNA of lymph nodes 7, 4L and 11L with no malignant cells identified.  2.  Social/family history: - Lives with wife at home.  Uses walker for ambulation since fall.  Worked as a insurance account manager at [employer].  No chemical/asbestos exposure.  Quit smoking cigarettes on 10/24/1997.  Smoked 2 packs/day starting at age 60. - Brother had agent orange exposure in [place] and had bladder cancer.  3.  Prostate cancer: - Diagnosed in 2012, status post XRT in [place].  Plan:  1.  Stage I (T1 cN0 M0) squamous cell carcinoma of the left upper lobe of the lung: - We reviewed images of the PET scan with the patient and his wife. - We discussed pathology reports in detail. - We discussed options including surgical resection and SBRT if not surgical candidate. - Due to his age and his current situation, he was not interested in surgery.  I have recommended SBRT.  He wanted  to have it done locally.  We will reach out to radiation oncology in [place] to see if they can do SBRT like treatment.  If not he will be referred to [facility] radiation oncology. - I have recommended ordering MRI of the brain with and without contrast to complete staging. - We also discussed surveillance with repeating CT of the chest 3 months after completing SBRT.  After that we will repeat CT of the chest every 6 months. - I will see him back in 4 months with repeat CT of the chest.   Orders Placed This Encounter  Procedures   MR Brain W Wo Contrast    Standing Status:   Future    Expected Date:   06/08/2023    Expiration Date:   05/31/2024    If indicated for the ordered procedure, I authorize the administration of contrast media per Radiology protocol:   Yes    What is the patient's sedation requirement?:   No Sedation    Does the patient have a pacemaker or implanted devices?:   No    Use SRS Protocol?:   No    Preferred imaging location?:   [facility] (table limit - 550lbs)    Release to patient:   Immediate   CT Chest W Contrast    Standing Status:   Future    Expected Date:   09/29/2023    Expiration Date:   05/31/2024    If indicated for the  ordered procedure, I authorize the administration of contrast media per Radiology protocol:   Yes    Does the patient have a contrast media/X-ray dye allergy?:   No    Preferred imaging location?:   [facility]    Release to patient:   Immediate [1]      I,Katie Daubenspeck,acting as a scribe for Alean Stands, MD.,have documented all relevant documentation on the behalf of Alean Stands, MD,as directed by  Alean Stands, MD while in the presence of Alean Stands, MD.   I, Alean Stands MD, have reviewed the above documentation for accuracy and completeness, and I agree with the above.   Alean Stands, MD   1/6/20254:02 PM  CHIEF COMPLAINT/PURPOSE OF CONSULT:   Diagnosis: LUL  squamous cell lung cancer   Cancer Staging  Squamous cell carcinoma of upper lobe of left lung (HCC) Staging form: Lung, AJCC V9 - Clinical stage from 05/31/2023: Stage IA3 (cT1c, cN0(f), cM0) - Unsigned    Prior Therapy: none  Current Therapy: Radiation therapy   HISTORY OF PRESENT ILLNESS:   Oncology History   No history exists.      Calvin Moreno is a 83 y.o. male presenting to clinic today for evaluation of LUL squamous cell lung cancer at the request of Dr. Malka.  He initially presented to his PCP with SOB with cough for several months. He underwent a chest x-ray on 03/05/23 showing a questionable 19 mm opacity in LUL. He was later referred to the ED on 03/30/23 following a fall and overall decline in health. A chest CT was performed in the hospital and showed: 2.4 cm nodule in anterior LUL, highly suspicious; contralateral 15 mm nodule along posterior RUL; small mediastinal nodes, indeterminate.  He was referred to pulmonology after being discharged from the hospital to a nursing home. He underwent PET scan on 04/28/23 for further work up showing: hypermetabolism to 2.3 cm anterior LUL nodule; nonspecific 1 cm subcarinal node; no findings of distant/extrathoracic metastatic disease. He proceeded to bronchoscopy with biopsies on 05/14/23 under Dr. Malka. Cytology from the LUL confirmed squamous cell carcinoma. FNA of three lymph nodes (levels 4, 7, and 11) were all negative.  Of note, he has a history of prostate cancer, s/p XRT in 2012.  Today, he states that he is doing well overall. His appetite level is at 70%. His energy level is at 70%.  PAST MEDICAL HISTORY:   Past Medical History: Past Medical History:  Diagnosis Date   Aortic stenosis, mod 2021   And insufficiency, evaluation by Dr. Edith in 2000   Arthritis    Benign prostatic hypertrophy    PSA of 1.39 in 09/2010   Chronic kidney disease    Borderline; creatinine of 1.6 in 08/2009, but 1.15 in 05/2010   Chronic  sinusitis    By MRI   Diverticulosis 2013   GERD (gastroesophageal reflux disease)    Hearing impaired    Hemorrhoids 2006   Hiatal hernia 2006   History of kidney stones    HOH (hard of hearing)    Hyperlipidemia    Elevated triglycerides   Hypertension    Mild internal carotid artery plaque on MRI/MRA in 2002   Hypothyroidism    IBS (irritable bowel syndrome)    Insomnia    Left bundle branch block 2012   2012   Metabolic syndrome    Fasting hyperglycemia   Murmur    Nephrolithiasis 08/2009   2011   Obesity    Proctitis  radiation induced   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   s/p XRT   Seasonal allergies    Stroke (HCC)    TIA, no residual, seen on MRI   Tobacco abuse, in remission    60 pack years; quit in 1999   Uses hearing aid    bilateral    Surgical History: Past Surgical History:  Procedure Laterality Date   ANKLE FRACTURE SURGERY     left   BIOPSY  12/31/2022   Procedure: BIOPSY;  Surgeon: Shaaron Lamar HERO, MD;  [facility]: [facility];  Service: Endoscopy;;   BRONCHIAL BIOPSY  05/14/2023   Procedure: BRONCHIAL BIOPSIES;  Surgeon: Malka Domino, MD;  Location: [facility];  Service: Pulmonary;;   BRONCHIAL NEEDLE ASPIRATION BIOPSY  05/14/2023   Procedure: BRONCHIAL NEEDLE ASPIRATION BIOPSIES;  Surgeon: Malka Domino, MD;  Location: [facility];  Service: Pulmonary;;   CATARACT EXTRACTION Left    CATARACT EXTRACTION W/PHACO Right 12/19/2013   Procedure: CATARACT EXTRACTION PHACO AND INTRAOCULAR LENS PLACEMENT RIGHT EYE CDE=14.78;  Surgeon: Cherene Mania, MD;  Location: [facility];  Service: Ophthalmology;  Laterality: Right;   CHOLECYSTECTOMY     COLONOSCOPY  2006   Dr. Retia mucosa throughout colon, moderate internal hemorrhoids. bx= benign colonic mucosa   COLONOSCOPY  12/03/2011   Dr. Shaaron- colonic diverticulosis, radiation-induced proctitis- s/p APC ablation, no microscopic colitis on bx   COLONOSCOPY WITH PROPOFOL  N/A 06/12/2020    Procedure: COLONOSCOPY WITH PROPOFOL ;  Surgeon: Shaaron Lamar HERO, MD;  [facility]: [facility];  Service: Endoscopy;  Laterality: N/A;  2:45pm   ESOPHAGOGASTRODUODENOSCOPY  2006   Dr. Celestia- small hiatal hernia and slightly reddened distal esophagus o/w normal   ESOPHAGOGASTRODUODENOSCOPY (EGD) WITH PROPOFOL  N/A 12/31/2022   Procedure: ESOPHAGOGASTRODUODENOSCOPY (EGD) WITH PROPOFOL ;  Surgeon: Shaaron Lamar HERO, MD;  [facility];  Service: Endoscopy;  Laterality: N/A;  1245pm, asa 3   FLEXIBLE SIGMOIDOSCOPY N/A 07/13/2013   MFM:Wzncjdrlojm changes of the rectum with active oozing consistent with chronic radiation proctitis.  Otherwise, negative sigmoidoscopy to 40 cm.Status post argon plasma coagulation ablation    FLEXIBLE SIGMOIDOSCOPY N/A 04/02/2023   Procedure: FLEXIBLE SIGMOIDOSCOPY;  Surgeon: Cinderella Deatrice FALCON, MD;  [facility];  Service: Endoscopy;  Laterality: N/A;   HOT HEMOSTASIS N/A 07/13/2013   Procedure: HOT HEMOSTASIS (ARGON PLASMA COAGULATION/BICAP);  Surgeon: Lamar HERO Shaaron, MD;  [facility];  Service: Endoscopy;  Laterality: N/A;   HOT HEMOSTASIS  04/02/2023   Procedure: HOT HEMOSTASIS (ARGON PLASMA COAGULATION/BICAP);  Surgeon: Cinderella Deatrice FALCON, MD;  [facility];  Service: Endoscopy;;   Ileocolonoscopy  12/03/2011   MFM:Rnonwpr diverticulosis. Radiation-induced proctitis status post APC ablation. Status post segmental colon Biopsy   MALONEY DILATION N/A 12/31/2022   Procedure: MALONEY DILATION;  Surgeon: Shaaron Lamar HERO, MD;  [facility];  Service: Endoscopy;  Laterality: N/A;   TONSILLECTOMY      Social History: Social History   Socioeconomic History   Marital status: Married    Spouse name: Not on file   Number of children: 2   Years of education: Not on file   Highest education level: Not on file  Occupational History   Occupation: Retired    Associate Professor: [employer]  Tobacco Use   Smoking status: Former     Current packs/day: 0.00    Average packs/day: 1.5 packs/day for 40.0 years (60.0 ttl pk-yrs)    Types: Cigarettes    Start date: 11/04/1957    Quit  date: 11/04/1997    Years since quitting: 25.5    Passive exposure: Never   Smokeless tobacco: Never   Tobacco comments:    Quit in 1999  Vaping Use   Vaping status: Never Used  Substance and Sexual Activity   Alcohol use: No    Alcohol/week: 0.0 standard drinks of alcohol   Drug use: No   Sexual activity: Yes  Other Topics Concern   Not on file  Social History Narrative   Married with 2 children   Lives locally   No regular exercise   Social Drivers of Health   Financial Resource Strain: Not on file  Food Insecurity: No Food Insecurity (03/30/2023)   Hunger Vital Sign    Worried About Running Out of Food in the Last Year: Never true    Ran Out of Food in the Last Year: Never true  Transportation Needs: No Transportation Needs (03/30/2023)   PRAPARE - Administrator, Civil Service (Medical): No    Lack of Transportation (Non-Medical): No  Physical Activity: Not on file  Stress: Not on file  Social Connections: Not on file  Intimate Partner Violence: Not At Risk (03/30/2023)   Humiliation, Afraid, Rape, and Kick questionnaire    Fear of Current or Ex-Partner: No    Emotionally Abused: No    Physically Abused: No    Sexually Abused: No    Family History: Family History  Problem Relation Age of Onset   Heart failure Mother    Heart attack Father    Heart failure Brother        bladder cancer (agent orange exposure)   Cirrhosis Brother        chronic viral hepatitis   Colon cancer Neg Hx     Current Medications:  Current Outpatient Medications:    acetaminophen  (TYLENOL ) 500 MG tablet, Take 2 tablets (1,000 mg total) by mouth every 8 (eight) hours as needed for mild pain (pain score 1-3)., Disp: 100 tablet, Rfl: 2   acyclovir (ZOVIRAX) 800 MG tablet, Take 800 mg by mouth every 8 (eight) hours as needed. And  give one tablet by mouth once daily., Disp: , Rfl:    ALPRAZolam  (XANAX ) 0.5 MG tablet, Take 1 tablet (0.5 mg total) by mouth at bedtime., Disp: 30 tablet, Rfl: 0   alum & mag hydroxide-simeth (MAALOX PLUS) 400-400-40 MG/5ML suspension, Take 5 mLs by mouth every 4 (four) hours as needed for indigestion., Disp: , Rfl:    Ascorbic Acid (VITAMIN C) 1000 MG tablet, Take 1,000 mg by mouth daily., Disp: , Rfl:    docusate sodium  (COLACE) 100 MG capsule, Take 1 capsule (100 mg total) by mouth 2 (two) times daily., Disp: 10 capsule, Rfl: 0   DULoxetine (CYMBALTA) 30 MG capsule, Take 30 mg by mouth daily., Disp: , Rfl:    finasteride  (PROSCAR ) 5 MG tablet, Take 1 tablet (5 mg total) by mouth daily., Disp: 90 tablet, Rfl: 3   fluticasone (CUTIVATE) 0.05 % cream, SMARTSIG:Topical 1-2 Times Daily PRN, Disp: , Rfl:    furosemide  (LASIX ) 40 MG tablet, Take 40 mg by mouth daily., Disp: , Rfl:    levothyroxine  (SYNTHROID ) 25 MCG tablet, Take 25 mcg by mouth daily., Disp: , Rfl:    meclizine  (ANTIVERT ) 12.5 MG tablet, Take 1 tablet (12.5 mg total) by mouth 3 (three) times daily as needed for dizziness., Disp: , Rfl:    Melatonin 3 MG TABS, Take 3 mg by mouth at bedtime. For sleep, Disp: ,  Rfl:    melatonin 5 MG TABS, Take 5 mg by mouth at bedtime. Both 3 and 5 mg is listed at [facility], Disp: , Rfl:    methocarbamol  (ROBAXIN ) 500 MG tablet, Take 1 tablet (500 mg total) by mouth every 8 (eight) hours as needed for muscle spasms., Disp: , Rfl:    oxyCODONE  (OXY IR/ROXICODONE ) 5 MG immediate release tablet, Take 1 tablet (5 mg total) by mouth every 6 (six) hours as needed for severe pain (pain score 7-10)., Disp: 30 tablet, Rfl: 0   pantoprazole  (PROTONIX ) 40 MG tablet, Take 1 tablet (40 mg total) by mouth daily before breakfast., Disp: 90 tablet, Rfl: 3   Polyethyl Glycol-Propyl Glycol (SYSTANE OP), Place 1 drop into both eyes 2 (two) times daily as needed (dry eyes)., Disp: , Rfl:    polyethylene glycol (MIRALAX  /  GLYCOLAX ) 17 g packet, Take 17 g by mouth daily. Can be spread to every other day in the setting of diarrhea., Disp: , Rfl:    potassium chloride  (KLOR-CON  M) 10 MEQ tablet, Take 1 tablet (10 mEq total) by mouth daily., Disp: 30 tablet, Rfl: 0   psyllium (HYDROCIL/METAMUCIL) 95 % PACK, Take 1 packet by mouth 2 (two) times daily., Disp: , Rfl:    silodosin  (RAPAFLO ) 8 MG CAPS capsule, Take 1 capsule (8 mg total) by mouth 2 (two) times daily., Disp: 180 capsule, Rfl: 3   simvastatin  (ZOCOR ) 20 MG tablet, Take 20 mg by mouth every evening., Disp: , Rfl:    sucralfate  (CARAFATE ) 1 GM/10ML suspension, Provide 1 gram enema twice a day., Disp: , Rfl:    sucralfate  (CARAFATE ) 1 GM/10ML suspension, 20 ml by mouth before mesals for GERD, Disp: , Rfl:    traMADol  (ULTRAM ) 50 MG tablet, Take 25-50 mg by mouth 2 (two) times daily as needed., Disp: , Rfl:    vitamin B-12 (CYANOCOBALAMIN ) 1000 MCG tablet, Take 2,000 mcg by mouth daily., Disp: , Rfl:    Zinc  Oxide (TRIPLE PASTE) 12.8 % ointment, Apply 1 Application topically. Every shift., Disp: , Rfl:    Allergies: Allergies  Allergen Reactions   Norvasc  [Amlodipine  Besylate] Swelling    Ankle swelling   Codeine Nausea And Vomiting   Amoxicillin Rash    REVIEW OF SYSTEMS:   Review of Systems  Constitutional:  Negative for chills, fatigue and fever.  HENT:   Negative for lump/mass, mouth sores, nosebleeds, sore throat and trouble swallowing.   Eyes:  Negative for eye problems.  Respiratory:  Negative for cough and shortness of breath.   Cardiovascular:  Negative for chest pain, leg swelling and palpitations.  Gastrointestinal:  Positive for constipation and diarrhea. Negative for abdominal pain, nausea and vomiting.  Genitourinary:  Negative for bladder incontinence, difficulty urinating, dysuria, frequency, hematuria and nocturia.   Musculoskeletal:  Negative for arthralgias, back pain, flank pain, myalgias and neck pain.  Skin:  Negative for itching  and rash.  Neurological:  Positive for dizziness and headaches. Negative for numbness.  Hematological:  Does not bruise/bleed easily.  Psychiatric/Behavioral:  Positive for sleep disturbance. Negative for depression and suicidal ideas. The patient is not nervous/anxious.   All other systems reviewed and are negative.    VITALS:   Blood pressure 132/69, pulse (!) 119, temperature 97.8 F (36.6 C), temperature source Oral, resp. rate 20, weight 144 lb (65.3 kg), SpO2 98%.  Wt Readings from Last 3 Encounters:  06/01/23 144 lb (65.3 kg)  05/14/23 148 lb (67.1 kg)  05/11/23 145 lb 12.8  oz (66.1 kg)    Body mass index is 23.24 kg/m.  Performance status (ECOG): 1 - Symptomatic but completely ambulatory  PHYSICAL EXAM:   Physical Exam Vitals and nursing note reviewed. Exam conducted with a chaperone present.  Constitutional:      Appearance: Normal appearance.  Cardiovascular:     Rate and Rhythm: Normal rate and regular rhythm.     Pulses: Normal pulses.     Heart sounds: Normal heart sounds.  Pulmonary:     Effort: Pulmonary effort is normal.     Breath sounds: Normal breath sounds.  Abdominal:     Palpations: Abdomen is soft. There is no hepatomegaly, splenomegaly or mass.     Tenderness: There is no abdominal tenderness.  Musculoskeletal:     Right lower leg: No edema.     Left lower leg: No edema.  Lymphadenopathy:     Cervical: No cervical adenopathy.     Right cervical: No superficial, deep or posterior cervical adenopathy.    Left cervical: No superficial, deep or posterior cervical adenopathy.     Upper Body:     Right upper body: No supraclavicular or axillary adenopathy.     Left upper body: No supraclavicular or axillary adenopathy.  Neurological:     General: No focal deficit present.     Mental Status: He is alert and oriented to person, place, and time.  Psychiatric:        Mood and Affect: Mood normal.        Behavior: Behavior normal.     LABS:    CBC    Component Value Date/Time   WBC 11.1 (H) 05/14/2023 0625   RBC 4.34 05/14/2023 0625   HGB 12.6 (L) 05/14/2023 0625   HCT 39.7 05/14/2023 0625   PLT 324 05/14/2023 0625   MCV 91.5 05/14/2023 0625   MCH 29.0 05/14/2023 0625   MCHC 31.7 05/14/2023 0625   RDW 12.0 05/14/2023 0625   LYMPHSABS 1.0 03/30/2023 1817   MONOABS 0.7 03/30/2023 1817   EOSABS 0.2 03/30/2023 1817   BASOSABS 0.0 03/30/2023 1817    CMP    Component Value Date/Time   NA 137 05/14/2023 0625   NA 137 04/06/2023 0000   K 4.6 05/14/2023 0625   CL 100 05/14/2023 0625   CO2 30 05/14/2023 0625   GLUCOSE 151 (H) 05/14/2023 0625   BUN 8 05/14/2023 0625   BUN 8 04/06/2023 0000   CREATININE 0.94 05/14/2023 0625   CREATININE 1.16 10/02/2014 0817   CALCIUM 8.9 05/14/2023 0625   PROT 5.1 (L) 04/01/2023 0505   ALBUMIN 2.3 (L) 04/01/2023 0505   AST 16 04/01/2023 0505   ALT 19 04/01/2023 0505   ALKPHOS 107 04/01/2023 0505   BILITOT 0.9 04/01/2023 0505   GFRNONAA >60 05/14/2023 0625   GFRAA >60 06/17/2018 0626     No results found for: CEA1, CEA / No results found for: CEA1, CEA No results found for: PSA1 No results found for: CAN199 No results found for: CAN125  No results found for: TOTALPROTELP, ALBUMINELP, A1GS, A2GS, BETS, BETA2SER, GAMS, MSPIKE, SPEI No results found for: TIBC, FERRITIN, IRONPCTSAT No results found for: LDH   STUDIES:   DG Chest Port 1 View Result Date: 05/14/2023 CLINICAL DATA:  Status post bronchoscopy EXAM: PORTABLE CHEST 1 VIEW COMPARISON:  03/05/2023, 04/28/2023 FINDINGS: Single frontal view of the chest was obtained. The cardiac silhouette is unremarkable. No evidence of acute airspace disease, effusion, or pneumothorax. The known left upper lobe nodule  is not as apparent by x-ray. No acute bony abnormalities. IMPRESSION: 1. No complication after bronchoscopy.  No evidence of pneumothorax. Electronically Signed   By: Ozell Daring  M.D.   On: 05/14/2023 11:29   DG C-ARM BRONCHOSCOPY Result Date: 05/14/2023 C-ARM BRONCHOSCOPY: Fluoroscopy was utilized by the requesting physician.  No radiographic interpretation.

## 2023-06-02 ENCOUNTER — Telehealth: Payer: Self-pay

## 2023-06-02 NOTE — Telephone Encounter (Signed)
 Patient notified that refills are at pharmacy.  He will call back if pharmacy is requiring a new prescription.

## 2023-06-02 NOTE — Telephone Encounter (Signed)
 Patient called again about getting a refill on   silodosin (RAPAFLO) 8 MG CAPS capsule   Call refill into [facility]

## 2023-06-02 NOTE — Telephone Encounter (Signed)
 Patient needing  a new Rx for 2 a day of silodosin (RAPAFLO).  [facility]

## 2023-06-08 ENCOUNTER — Ambulatory Visit (HOSPITAL_COMMUNITY)
Admission: RE | Admit: 2023-06-08 | Discharge: 2023-06-08 | Disposition: A | Discharge status: Home / Self Care | Payer: Medicare Other | Source: Ambulatory Visit | Attending: Hematology | Admitting: Hematology

## 2023-06-08 DIAGNOSIS — C3490 Malignant neoplasm of unspecified part of unspecified bronchus or lung: Secondary | ICD-10-CM | POA: Diagnosis present

## 2023-06-08 MED ORDER — GADOBUTROL 1 MMOL/ML IV SOLN
6.5000 mL | Freq: Once | INTRAVENOUS | Status: AC | PRN
  Administered 2023-06-08: 6.5 mL via INTRAVENOUS

## 2023-06-09 ENCOUNTER — Inpatient Hospital Stay: Payer: Medicare Other | Admitting: Dietician

## 2023-06-09 ENCOUNTER — Telehealth: Payer: Self-pay | Admitting: Dietician

## 2023-06-09 NOTE — Telephone Encounter (Signed)
 Patient screened on MST. First attempt to reach. Wife answered home # and said patient wasn't home and due to hearing issues best to call mobile# when trying to reach.  Called mobile patient was at grocery store and requested I call later this afternoon. Provided my cell# in text to let him know I would call back for his nutrition consult.  Micheline Craven, RDN, LDN Registered Dietitian, [facility] Part Time Remote (Usual office hours: Tuesday-Thursday) Cell: [phone]

## 2023-06-09 NOTE — Progress Notes (Signed)
 Nutrition Assessment   Reason for Assessment: MST screen for weight loss.    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  Treatment plan is for SBRT and surveillance with repeating CT of the chest 3 months after completing SBRT.  He is trying to get radiation for afternoons. He reports he had a fall and hurt his back and was put in rehab which is when he lost his appetite because he wasn't as active.  He got out of rehab and was getting meals on wheel but he doesn't like them and just canceled them.  He is eating 2 meals a day and neither he or wife cook as both have some neuropathy and can't stand for long period of time to prep meals.  They have an aid from [employer] cooks breakfast on some days. Also he buys frozen dinners sometimes.  He doesn't care for many ONS.  He is having trouble with his computer and while the [email or web address] email is best he said it won't work for a few weeks until he gets his computer fixed. Breakfast: cereal, banana if aid doesn't cook. Restaurant take out or frozen dinners.   Anthropometrics: lost 16# in November after his fall.  Height: 66 Weight: 144# UBW: 160-165# BMI: 23.24    NUTRITION DIAGNOSIS: Inadequate PO intake to meet increased nutrient needs, r/t cancer diagnosis  INTERVENTION:  Relayed that nutrition services are wrap around service provided at no charge and encouraged continued communication if experiencing continued weight loss or any nutritional impact symptoms (NIS). Educated on importance of adequate calorie and protein energy intake  with nutrient dense foods when possible to maintain weight/strength Discussed ways to add calories/protein to foods (adding cheese, cooking with butter, creamy sauces/gravy) - shake recipes given   Encouraged more frequent feeds and to eat at least 3 meals with some high protein snacking  Mailed Nutrition Tip sheet  for  High Protein High  Calorie Snacking with  Contact information provided   MONITORING, EVALUATION, GOAL: weight, PO intake, Nutrition Impact Symptoms, labs Goal is weight maintenance  Next Visit: Remote next month  Cyndi Jiovanni Heeter, RDN, LDN Registered Dietitian, [facility] Part Time Remote (Usual office hours: Tuesday-Thursday) Cell: [phone]

## 2023-06-10 ENCOUNTER — Telehealth: Payer: Self-pay | Admitting: Urology

## 2023-06-10 NOTE — Telephone Encounter (Signed)
 Patient says Claretta Croft said he could take 2 silodosin . He wants to clarify if he can take 2 twice a day or one twice a day.

## 2023-06-11 ENCOUNTER — Telehealth: Payer: Self-pay

## 2023-06-11 NOTE — Telephone Encounter (Signed)
Tried calling patient with no answer and unable to leave vm due to mailbox not setup "Per Dr. Ronne Binning last office patient is to increase silodosin 8 mg BID"

## 2023-06-11 NOTE — Telephone Encounter (Signed)
Patient left a voice message 06-11-2023.  Needing to ask questions regarding medication prescribed.  Please advise.

## 2023-06-24 ENCOUNTER — Ambulatory Visit: Payer: Medicare Other | Admitting: Urology

## 2023-06-24 VITALS — BP 127/67 | HR 112

## 2023-06-24 DIAGNOSIS — R3912 Poor urinary stream: Secondary | ICD-10-CM

## 2023-06-24 DIAGNOSIS — N401 Enlarged prostate with lower urinary tract symptoms: Secondary | ICD-10-CM | POA: Diagnosis not present

## 2023-06-24 DIAGNOSIS — N138 Other obstructive and reflux uropathy: Secondary | ICD-10-CM

## 2023-06-24 LAB — BLADDER SCAN AMB NON-IMAGING: Scan Result: 41

## 2023-06-24 MED ORDER — FINASTERIDE 5 MG PO TABS: 5.0000 mg | ORAL_TABLET | Freq: Every day | ORAL | 3 refills | Status: DC

## 2023-06-24 MED ORDER — SILODOSIN 8 MG PO CAPS: 8.0000 mg | ORAL_CAPSULE | Freq: Two times a day (BID) | ORAL | 3 refills | Status: DC

## 2023-06-24 NOTE — Progress Notes (Signed)
post void residual =41

## 2023-06-24 NOTE — Progress Notes (Signed)
06/24/2023 4:17 PM   Calvin Moreno Dec 28, 1940 [id number]  Referring provider: Benita Stabile, MD [address] [place],  [place] [address]  Followup bph   HPI: [name] is a 82yo here for followup for BPh with weak stream. IPSS 21 QOL 4 on rapalfo 8mg  BID and finasteride 5mg  daily. He continues to have a weak urinary stream. He has nocturia 2-3x. He drinks 5-6 ginger ales per day and no water.    PMH: Past Medical History:  Diagnosis Date   Aortic stenosis, mod 2021   And insufficiency, evaluation by Dr. Daleen Squibb in 2000   Arthritis    Benign prostatic hypertrophy    PSA of 1.39 in 09/2010   Chronic kidney disease    Borderline; creatinine of 1.6 in 08/2009, but 1.15 in 05/2010   Chronic sinusitis    By MRI   Diverticulosis 2013   GERD (gastroesophageal reflux disease)    Hearing impaired    Hemorrhoids 2006   Hiatal hernia 2006   History of kidney stones    HOH (hard of hearing)    Hyperlipidemia    Elevated triglycerides   Hypertension    Mild internal carotid artery plaque on MRI/MRA in 2002   Hypothyroidism    IBS (irritable bowel syndrome)    Insomnia    Left bundle branch block 2012   2012   Metabolic syndrome    Fasting hyperglycemia   Murmur    Nephrolithiasis 08/2009   2011   Obesity    Proctitis    radiation induced   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   s/p XRT   Seasonal allergies    Stroke (HCC)    TIA, no residual, seen on MRI   Tobacco abuse, in remission    60 pack years; quit in 1999   Uses hearing aid    bilateral    Surgical History: Past Surgical History:  Procedure Laterality Date   ANKLE FRACTURE SURGERY     left   BIOPSY  12/31/2022   Procedure: BIOPSY;  Surgeon: Corbin Ade, MD;  [facility];  Service: Endoscopy;;   BRONCHIAL BIOPSY  05/14/2023   Procedure: BRONCHIAL BIOPSIES;  Surgeon: Janann Colonel, MD;  [facility]: [facility];  Service: Pulmonary;;   BRONCHIAL NEEDLE ASPIRATION BIOPSY  05/14/2023    Procedure: BRONCHIAL NEEDLE ASPIRATION BIOPSIES;  Surgeon: Janann Colonel, MD;  [facility]: [facility];  Service: Pulmonary;;   CATARACT EXTRACTION Left    CATARACT EXTRACTION W/PHACO Right 12/19/2013   Procedure: CATARACT EXTRACTION PHACO AND INTRAOCULAR LENS PLACEMENT RIGHT EYE CDE=14.78;  Surgeon: Gemma Payor, MD;  Location: [facility];  Service: Ophthalmology;  Laterality: Right;   CHOLECYSTECTOMY     COLONOSCOPY  2006   Dr. Henry Russel mucosa throughout colon, moderate internal hemorrhoids. bx= benign colonic mucosa   COLONOSCOPY  12/03/2011   Dr. Jena Gauss- colonic diverticulosis, radiation-induced proctitis- s/p APC ablation, no microscopic colitis on bx   COLONOSCOPY WITH PROPOFOL N/A 06/12/2020   Procedure: COLONOSCOPY WITH PROPOFOL;  Surgeon: Corbin Ade, MD;  [facility];  Service: Endoscopy;  Laterality: N/A;  2:45pm   ESOPHAGOGASTRODUODENOSCOPY  2006   Dr. Randa Evens- small hiatal hernia and slightly reddened distal esophagus o/w normal   ESOPHAGOGASTRODUODENOSCOPY (EGD) WITH PROPOFOL N/A 12/31/2022   Procedure: ESOPHAGOGASTRODUODENOSCOPY (EGD) WITH PROPOFOL;  Surgeon: Corbin Ade, MD;  [facility];  Service: Endoscopy;  Laterality: N/A;  1245pm, asa 3   FLEXIBLE SIGMOIDOSCOPY N/A 07/13/2013   TDD:[id number] changes  of the rectum with active oozing consistent with chronic radiation proctitis.  Otherwise, negative sigmoidoscopy to 40 cm.Status post argon plasma coagulation ablation    FLEXIBLE SIGMOIDOSCOPY N/A 04/02/2023   Procedure: FLEXIBLE SIGMOIDOSCOPY;  Surgeon: Franky Macho, MD;  [facility];  Service: Endoscopy;  Laterality: N/A;   HOT HEMOSTASIS N/A 07/13/2013   Procedure: HOT HEMOSTASIS (ARGON PLASMA COAGULATION/BICAP);  Surgeon: Corbin Ade, MD;  [facility];  Service: Endoscopy;  Laterality: N/A;   HOT HEMOSTASIS  04/02/2023   Procedure: HOT HEMOSTASIS (ARGON PLASMA COAGULATION/BICAP);  Surgeon: Franky Macho, MD;   [facility];  Service: Endoscopy;;   Ileocolonoscopy  12/03/2011   ZOX:[id number] diverticulosis. Radiation-induced proctitis status post APC ablation. Status post segmental colon Biopsy   MALONEY DILATION N/A 12/31/2022   Procedure: MALONEY DILATION;  Surgeon: Corbin Ade, MD;  [facility];  Service: Endoscopy;  Laterality: N/A;   TONSILLECTOMY      Home Medications:  Allergies as of 06/24/2023       Reactions   Norvasc [amlodipine Besylate] Swelling   Ankle swelling   Codeine Nausea And Vomiting   Amoxicillin Rash        Medication List        Accurate as of June 24, 2023  4:17 PM. If you have any questions, ask your nurse or doctor.          acetaminophen 500 MG tablet Commonly known as: TYLENOL Take 2 tablets (1,000 mg total) by mouth every 8 (eight) hours as needed for mild pain (pain score 1-3).   acyclovir 800 MG tablet Commonly known as: ZOVIRAX Take 800 mg by mouth every 8 (eight) hours as needed. And give one tablet by mouth once daily.   ALPRAZolam 0.5 MG tablet Commonly known as: XANAX Take 1 tablet (0.5 mg total) by mouth at bedtime.   alum & mag hydroxide-simeth 400-400-40 MG/5ML suspension Commonly known as: MAALOX PLUS Take 5 mLs by mouth every 4 (four) hours as needed for indigestion.   cyanocobalamin 1000 MCG tablet Commonly known as: VITAMIN B12 Take 2,000 mcg by mouth daily.   docusate sodium 100 MG capsule Commonly known as: COLACE Take 1 capsule (100 mg total) by mouth 2 (two) times daily.   DULoxetine 30 MG capsule Commonly known as: CYMBALTA Take 30 mg by mouth daily.   finasteride 5 MG tablet Commonly known as: PROSCAR Take 1 tablet (5 mg total) by mouth daily.   fluticasone 0.05 % cream Commonly known as: CUTIVATE SMARTSIG:Topical 1-2 Times Daily PRN   furosemide 40 MG tablet Commonly known as: LASIX Take 40 mg by mouth daily.   levothyroxine 25 MCG tablet Commonly known as: SYNTHROID Take 25  mcg by mouth daily.   meclizine 12.5 MG tablet Commonly known as: ANTIVERT Take 1 tablet (12.5 mg total) by mouth 3 (three) times daily as needed for dizziness.   melatonin 3 MG Tabs tablet Take 3 mg by mouth at bedtime. For sleep   melatonin 5 MG Tabs Take 5 mg by mouth at bedtime. Both 3 and 5 mg is listed at [facility]   methocarbamol 500 MG tablet Commonly known as: ROBAXIN Take 1 tablet (500 mg total) by mouth every 8 (eight) hours as needed for muscle spasms.   oxyCODONE 5 MG immediate release tablet Commonly known as: Oxy IR/ROXICODONE Take 1 tablet (5 mg total) by mouth every 6 (six) hours as needed for severe pain (pain score 7-10).  pantoprazole 40 MG tablet Commonly known as: PROTONIX Take 1 tablet (40 mg total) by mouth daily before breakfast.   polyethylene glycol 17 g packet Commonly known as: MIRALAX / GLYCOLAX Take 17 g by mouth daily. Can be spread to every other day in the setting of diarrhea.   potassium chloride 10 MEQ tablet Commonly known as: KLOR-CON M Take 1 tablet (10 mEq total) by mouth daily.   psyllium 95 % Pack Commonly known as: HYDROCIL/METAMUCIL Take 1 packet by mouth 2 (two) times daily.   silodosin 8 MG Caps capsule Commonly known as: RAPAFLO Take 1 capsule (8 mg total) by mouth 2 (two) times daily.   simvastatin 20 MG tablet Commonly known as: ZOCOR Take 20 mg by mouth every evening.   sucralfate 1 GM/10ML suspension Commonly known as: CARAFATE 20 ml by mouth before mesals for GERD   sucralfate 1 GM/10ML suspension Commonly known as: Carafate Provide 1 gram enema twice a day.   SYSTANE OP Place 1 drop into both eyes 2 (two) times daily as needed (dry eyes).   traMADol 50 MG tablet Commonly known as: ULTRAM Take 25-50 mg by mouth 2 (two) times daily as needed.   vitamin C 1000 MG tablet Take 1,000 mg by mouth daily.   Zinc Oxide 12.8 % ointment Commonly known as: TRIPLE PASTE Apply 1 Application topically. Every  shift.        Allergies:  Allergies  Allergen Reactions   Norvasc [Amlodipine Besylate] Swelling    Ankle swelling   Codeine Nausea And Vomiting   Amoxicillin Rash    Family History: Family History  Problem Relation Age of Onset   Heart failure Mother    Heart attack Father    Heart failure Brother        bladder cancer (agent orange exposure)   Cirrhosis Brother        chronic viral hepatitis   Colon cancer Neg Hx     Social History:  reports that he quit smoking about 25 years ago. His smoking use included cigarettes. He started smoking about 65 years ago. He has a 60 pack-year smoking history. He has never been exposed to tobacco smoke. He has never used smokeless tobacco. He reports that he does not drink alcohol and does not use drugs.  ROS: All other review of systems were reviewed and are negative except what is noted above in HPI  Physical Exam: BP 127/67   Pulse (!) 112   Constitutional:  Alert and oriented, No acute distress. HEENT: [place] AT, moist mucus membranes.  Trachea midline, no masses. Cardiovascular: No clubbing, cyanosis, or edema. Respiratory: Normal respiratory effort, no increased work of breathing. GI: Abdomen is soft, nontender, nondistended, no abdominal masses GU: No CVA tenderness.  Lymph: No cervical or inguinal lymphadenopathy. Skin: No rashes, bruises or suspicious lesions. Neurologic: Grossly intact, no focal deficits, moving all 4 extremities. Psychiatric: Normal mood and affect.  Laboratory Data: Lab Results  Component Value Date   WBC 11.1 (H) 05/14/2023   HGB 12.6 (L) 05/14/2023   HCT 39.7 05/14/2023   MCV 91.5 05/14/2023   PLT 324 05/14/2023    Lab Results  Component Value Date   CREATININE 0.94 05/14/2023    No results found for: "PSA"  No results found for: "TESTOSTERONE"  Lab Results  Component Value Date   HGBA1C 5.9 06/06/2010    Urinalysis    Component Value Date/Time   COLORURINE YELLOW 03/30/2023 1930    APPEARANCEUR CLEAR 03/30/2023 1930  APPEARANCEUR Clear 08/13/2022 1502   LABSPEC 1.023 03/30/2023 1930   PHURINE 5.0 03/30/2023 1930   GLUCOSEU NEGATIVE 03/30/2023 1930   HGBUR NEGATIVE 03/30/2023 1930   BILIRUBINUR NEGATIVE 03/30/2023 1930   BILIRUBINUR Negative 08/13/2022 1502   KETONESUR NEGATIVE 03/30/2023 1930   PROTEINUR NEGATIVE 03/30/2023 1930   UROBILINOGEN negative (A) 10/19/2019 1444   UROBILINOGEN 0.2 08/26/2009 0930   NITRITE NEGATIVE 03/30/2023 1930   LEUKOCYTESUR NEGATIVE 03/30/2023 1930    Lab Results  Component Value Date   LABMICR Comment 08/13/2022   BACTERIA RARE (A) 06/17/2018    Pertinent Imaging:  No results found for this or any previous visit.  No results found for this or any previous visit.  No results found for this or any previous visit.  No results found for this or any previous visit.  Results for orders placed during the hospital encounter of 10/04/21  Ultrasound renal complete  Narrative CLINICAL DATA:  Nephrolithiasis.  EXAM: RENAL / URINARY TRACT ULTRASOUND COMPLETE  COMPARISON:  Renal ultrasound 10/02/2020.  FINDINGS: Right Kidney:  Renal measurements: 11.2 x 4.7 x 5.5 cm = volume: 149 mL. Echogenicity within normal limits. There is no hydronephrosis. There is an exophytic cyst in the inferior pole measuring 4.6 x 4.1 x 4.3 cm, minimally decreased in size compared to the prior study.  Left Kidney:  Renal measurements: 12.6 x 5.0 x 4.8 cm = volume: 156 mL. Echogenicity within normal limits. No mass or hydronephrosis visualized.  Bladder:  Appears normal for degree of bladder distention.  Other:  None.  IMPRESSION: 1. No acute abnormality involving the kidneys. 2. Right renal cyst measuring 4.6 cm.   Electronically Signed By: Darliss Cheney M.D. On: 10/07/2021 17:23  No results found for this or any previous visit.  No results found for this or any previous visit.  Results for orders placed during the  hospital encounter of 06/17/18  CT RENAL STONE STUDY  Narrative CLINICAL DATA:  Right flank pain starting this morning with nausea and vomiting.  EXAM: CT ABDOMEN AND PELVIS WITHOUT CONTRAST  TECHNIQUE: Multidetector CT imaging of the abdomen and pelvis was performed following the standard protocol without IV contrast.  COMPARISON:  08/26/2009  FINDINGS: Lower chest: Mitral annular calcification. Coronary atherosclerosis. No acute finding  Hepatobiliary: No focal liver abnormality.Cholecystectomy with normal common bile duct diameter.  Pancreas: Unremarkable.  Spleen: Unremarkable.  Adrenals/Urinary Tract: Negative adrenals. Mild right hydroureteronephrosis secondary to a 3 mm stone at the iliac vessel crossing. No additional urolithiasis. Chronic and nonspecific perinephric stranding on both sides. Exophytic right lower pole renal cyst. Prominent bladder wall thickness but under distended.  Stomach/Bowel:  No obstruction. No evident bowel inflammation.  Vascular/Lymphatic: Atherosclerotic calcification of the aorta and iliacs. No mass or adenopathy.  Reproductive:Prostatic calcifications and right-sided fiducial markers.  Other: No ascites or pneumoperitoneum.  Musculoskeletal: No acute abnormalities.  Osteopenia.  IMPRESSION: Mild right hydroureteronephrosis due to a 3 mm stone at the level of the iliac crossing.   Electronically Signed By: Marnee Spring M.D. On: 06/17/2018 07:09   Assessment & Plan:    1. Benign prostatic hyperplasia with urinary obstruction (Primary) Increase water intake to 2 bottles daily Rapaflo 8mg  daily and finasteride 5mg  daily - Urinalysis, Routine w reflex microscopic - BLADDER SCAN AMB NON-IMAGING  2. Weak urinary stream -rapaflo 8mg  daily and finasteride 5mg  daily   No follow-ups on file.  Wilkie Aye, MD  [facility] [place]

## 2023-06-25 LAB — URINALYSIS, ROUTINE W REFLEX MICROSCOPIC
Bilirubin, UA: NEGATIVE
Glucose, UA: NEGATIVE
Ketones, UA: NEGATIVE
Leukocytes,UA: NEGATIVE
Nitrite, UA: NEGATIVE
Protein,UA: NEGATIVE
RBC, UA: NEGATIVE
Specific Gravity, UA: 1.025 (ref 1.005–1.030)
Urobilinogen, Ur: 0.2 mg/dL (ref 0.2–1.0)
pH, UA: 5.5 (ref 5.0–7.5)

## 2023-06-26 ENCOUNTER — Telehealth: Payer: Self-pay

## 2023-06-26 ENCOUNTER — Encounter: Payer: Self-pay | Admitting: Urology

## 2023-06-26 NOTE — Patient Instructions (Signed)

## 2023-06-26 NOTE — Telephone Encounter (Signed)
-----   Message from Nurse Jill Side sent at 06/26/2023 10:49 AM EST ----- PA request

## 2023-06-26 NOTE — Telephone Encounter (Signed)
PA submitted Key-[id number]

## 2023-06-30 ENCOUNTER — Encounter: Payer: Self-pay | Admitting: Pulmonary Disease

## 2023-07-01 ENCOUNTER — Telehealth: Payer: Self-pay | Admitting: Dietician

## 2023-07-01 ENCOUNTER — Inpatient Hospital Stay: Payer: Medicare Other | Attending: Hematology | Admitting: Dietician

## 2023-07-01 NOTE — Progress Notes (Signed)
 Nutrition Follow Up:  Patient returned message from mobile text.  He prefers to be contacted at mobile# as he has hearing deficits and mobile is easier for him to hear.  He is schedule for radiation at [facility] starting next week in the afternoons.  He is eating 3 meals a day now trying to add a mid day lunch of soup or can of meat raviolis.  Still eating restaurant take out or frozen dinners. States he gets beans most days as a protein source and some days has more meat but some days just mac and cheese.   Anthropometrics: no new weight  Height: 66 Weight:  06/01/23  144# 05/14/23  148# 04/02/23  158# UBW: 160-165# BMI: 23.24    NUTRITION DIAGNOSIS: Inadequate PO intake to meet increased nutrient needs, r/t cancer diagnosis  INTERVENTION:  Encouraged continued attention to 3 meals a day. Suggested he add an extra glass of milk each night. Educated on importance of adequate calorie and protein energy intake  with nutrient dense foods when possible to maintain weight/strength with emphasis on high protein choices during radiation.  Provided my contact information.   MONITORING, EVALUATION, GOAL: weight, PO intake, Nutrition Impact Symptoms, labs Goal is weight maintenance  Next Visit: PRN if patient isn't able to maintain weight  Micheline Craven, RDN, LDN Registered Dietitian, [facility] Part Time Remote (Usual office hours: Tuesday-Thursday) Cell: [phone]

## 2023-07-01 NOTE — Telephone Encounter (Signed)
 Attempted to reach patient for a scheduled remote nutrition consult to address his continued weight loss. Provided my cell# on voice mail to return call for his follow up nutrition consult.  Micheline Craven, RDN, LDN Registered Dietitian, [facility] Part Time Remote (Usual office hours: Tuesday-Thursday) Cell: [phone]

## 2023-07-02 ENCOUNTER — Ambulatory Visit: Payer: Medicare Other | Admitting: Cardiology

## 2023-07-15 ENCOUNTER — Ambulatory Visit: Payer: Medicare Other | Admitting: Pulmonary Disease

## 2023-09-01 ENCOUNTER — Encounter: Payer: Self-pay | Admitting: Cardiology

## 2023-09-01 ENCOUNTER — Ambulatory Visit: Payer: Medicare Other | Attending: Cardiology | Admitting: Cardiology

## 2023-09-01 VITALS — BP 126/66 | Ht 66.0 in | Wt 149.0 lb

## 2023-09-01 DIAGNOSIS — E782 Mixed hyperlipidemia: Secondary | ICD-10-CM | POA: Diagnosis present

## 2023-09-01 DIAGNOSIS — I350 Nonrheumatic aortic (valve) stenosis: Secondary | ICD-10-CM | POA: Insufficient documentation

## 2023-09-01 NOTE — Progress Notes (Signed)
 Clinical Summary Mr. Gillen is a 83 y.o.male seen today for follow up of lthe following medical problems.    1. Aortic stenosis - 06/2017 echo LVEF 60-65%, grade II diastolic dysfunction, moderate to severe AS per report (mean grad 22, AVA VTI 1, dimensionless index 0.35). Criteria would suggest more moderate AS  - 07/2018 echo :LVEF >65%, mean grad 25, AVA VTI 0.65, dimensionless index 0.33, SVI    12/2021 echo: LVEF 60-65%, severe AS mean grad 35, AVA VTI 0.82, DI 0.24, SVI 40  02/2023 echo: LVEF 60-65%, no WMAs, grade I dd, severe AS mean grad 39 AVA VTI 0.93, DI 0.30    - denies any exertional symptoms thought limited by recent verterbal fracture wearing a brace, chronic neuropathy.  - highest level of activity is walking with walker up to about 10 minutes at his rehab facility, denies any symptoms  - no recent SOB/DOE. Minor chest pains at times, lasting just a second. Can occur at rest typically. No recent edema. No presyncope or sycope     2. 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 Mitral stenosis - mild by 12/2022 echo mean grad 4   4. HTN -compliant with meds   3. Hyperlipidemia - reports side effects on other statins in the past. Has done well on simva 20.  Labs followed by pcp   Jan 2023 TC 118 HDL 33 LDL 55 TG 148 - Jan 2025 TC 130 TG 98 HDL 48 LDL 64 - upcomng labs with pcp   4. Sinus tachcyardia Patient with frequent elevated heart rates in clinic. Checks heart rates regularly at home, typically in low 90s. Often has anxiety in the clinic   -no recent palpitations.     5. Chronic LBBB     6. GI bleed - admit 03/2023 - CT abdomen pelvis demonstrating colitis changes -Status post sigmoidoscopy demonstrating large mucosal ulceration in the rectum; lesion has been biopsied.   - intermittent blood  in stools mainly on toilet paper.    Past Medical History:  Diagnosis Date   Aortic stenosis, mod 2021   And insufficiency, evaluation by Dr. Daleen Squibb in 2000   Arthritis    Benign prostatic hypertrophy    PSA of 1.39 in 09/2010   Chronic kidney disease    Borderline; creatinine of 1.6 in 08/2009, but 1.15 in 05/2010   Chronic sinusitis    By MRI   Diverticulosis 2013   GERD (gastroesophageal reflux disease)    Hearing impaired    Hemorrhoids 2006   Hiatal hernia 2006   History of kidney stones    HOH (hard of hearing)    Hyperlipidemia    Elevated triglycerides   Hypertension    Mild internal carotid artery plaque on MRI/MRA in 2002   Hypothyroidism    IBS (irritable bowel syndrome)    Insomnia    Left bundle [place] Robello block 2012   2012   Metabolic syndrome    Fasting hyperglycemia   Murmur    Nephrolithiasis 08/2009   2011   Obesity    Proctitis    radiation induced   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 100 tablet 2   acyclovir (ZOVIRAX) 800 MG tablet Take 800 mg by mouth every 8 (eight) hours as needed. And give one tablet by mouth once daily.     ALPRAZolam (XANAX) 0.5 MG tablet Take 1 tablet (0.5 mg total) by mouth at bedtime. 30 tablet 0   alum & mag hydroxide-simeth (MAALOX PLUS) 400-400-40 MG/5ML suspension Take 5 mLs by mouth every 4 (four) hours as needed for indigestion.     Ascorbic Acid (VITAMIN C) 1000 MG tablet Take 1,000 mg by mouth  daily.     docusate sodium (COLACE) 100 MG capsule Take 1 capsule (100 mg total) by mouth 2 (two) times daily. 10 capsule 0   DULoxetine (CYMBALTA) 30 MG capsule Take 30 mg by mouth daily.     finasteride (PROSCAR) 5 MG tablet Take 1 tablet (5 mg total) by mouth daily. 90 tablet 3   fluticasone (CUTIVATE) 0.05 % cream SMARTSIG:Topical 1-2 Times Daily PRN     furosemide (LASIX) 40 MG tablet Take 40 mg by mouth daily.     levothyroxine (SYNTHROID) 25 MCG tablet Take 25 mcg by mouth daily.     meclizine (ANTIVERT) 12.5 MG tablet Take 1 tablet (12.5 mg total) by mouth 3 (three) times daily as needed for dizziness.     Melatonin 3 MG TABS Take 3 mg by mouth at bedtime. For sleep     melatonin 5 MG TABS Take 5 mg by mouth at bedtime. Both 3 and 5 mg is listed at [facility]     methocarbamol (ROBAXIN) 500 MG tablet Take 1 tablet (500 mg total) by mouth every 8 (eight) hours as needed for muscle spasms.     oxyCODONE (OXY IR/ROXICODONE) 5 MG immediate release tablet Take 1 tablet (5 mg total) by mouth every 6 (six) hours as needed for severe pain (pain score 7-10). 30 tablet 0   pantoprazole (PROTONIX) 40 MG tablet Take 1 tablet (40 mg total) by mouth daily before breakfast. 90 tablet 3   Polyethyl Glycol-Propyl Glycol (SYSTANE OP) Place 1 drop into both eyes 2 (two) times daily as needed (dry eyes).     polyethylene glycol (MIRALAX / GLYCOLAX) 17 g packet Take 17 g by mouth daily. Can be spread to every other day in the setting of diarrhea.     potassium chloride (KLOR-CON M) 10 MEQ tablet Take 1 tablet (10 mEq total) by mouth daily. 30 tablet 0   psyllium (HYDROCIL/METAMUCIL) 95 % PACK Take 1 packet by mouth 2 (two) times daily.     silodosin (RAPAFLO) 8 MG CAPS capsule Take 1 capsule (8 mg total) by mouth 2 (two) times daily. 180 capsule 3   simvastatin (ZOCOR) 20 MG tablet Take 20 mg by mouth every evening.     sucralfate (CARAFATE) 1 GM/10ML suspension Provide 1 gram enema twice a day.      sucralfate (CARAFATE) 1 GM/10ML suspension 20 ml by mouth before mesals for GERD     traMADol (ULTRAM) 50 MG tablet Take 25-50  mg by mouth 2 (two) times daily as needed.     vitamin B-12 (CYANOCOBALAMIN) 1000 MCG tablet Take 2,000 mcg by mouth daily.     Zinc Oxide (TRIPLE PASTE) 12.8 % ointment Apply 1 Application topically. Every shift.     No current facility-administered medications for this visit.     Past Surgical History:  Procedure Laterality Date   ANKLE FRACTURE SURGERY     left   BIOPSY  12/31/2022   Procedure: BIOPSY;  Surgeon: Corbin Ade, MD;  Location: [facility];  Service: Endoscopy;;   BRONCHIAL BIOPSY  05/14/2023   Procedure: BRONCHIAL BIOPSIES;  Surgeon: Janann Colonel, MD;  Location: [facility];  Service: Pulmonary;;   BRONCHIAL NEEDLE ASPIRATION BIOPSY  05/14/2023   Procedure: BRONCHIAL NEEDLE ASPIRATION BIOPSIES;  Surgeon: Janann Colonel, MD;  Location: [facility] ENDOSCOPY;  Service: Pulmonary;;   CATARACT EXTRACTION Left    CATARACT EXTRACTION W/PHACO Right 12/19/2013   Procedure: CATARACT EXTRACTION PHACO AND INTRAOCULAR LENS PLACEMENT RIGHT EYE CDE=14.78;  Surgeon: Gemma Payor, MD;  Location: [facility];  Service: Ophthalmology;  Laterality: Right;   CHOLECYSTECTOMY     COLONOSCOPY  2006   Dr. Henry Russel mucosa throughout colon, moderate internal hemorrhoids. bx= benign colonic mucosa   COLONOSCOPY  12/03/2011   Dr. Jena Gauss- colonic diverticulosis, radiation-induced proctitis- s/p APC ablation, no microscopic colitis on bx   COLONOSCOPY WITH PROPOFOL N/A 06/12/2020   Procedure: COLONOSCOPY WITH PROPOFOL;  Surgeon: Corbin Ade, MD;  Location: [facility];  Service: Endoscopy;  Laterality: N/A;  2:45pm   ESOPHAGOGASTRODUODENOSCOPY  2006   Dr. Randa Evens- small hiatal hernia and slightly reddened distal esophagus o/w normal   ESOPHAGOGASTRODUODENOSCOPY (EGD) WITH PROPOFOL N/A 12/31/2022   Procedure: ESOPHAGOGASTRODUODENOSCOPY (EGD) WITH PROPOFOL;  Surgeon:  Corbin Ade, MD;  Location: [facility];  Service: Endoscopy;  Laterality: N/A;  1245pm, asa 3   FLEXIBLE SIGMOIDOSCOPY N/A 07/13/2013   [id number] changes of the rectum with active oozing consistent with chronic radiation proctitis.  Otherwise, negative sigmoidoscopy to 40 cm.Status post argon plasma coagulation ablation    FLEXIBLE SIGMOIDOSCOPY N/A 04/02/2023   Procedure: FLEXIBLE SIGMOIDOSCOPY;  Surgeon: Franky Macho, MD;  Location: [facility];  Service: Endoscopy;  Laterality: N/A;   HOT HEMOSTASIS N/A 07/13/2013   Procedure: HOT HEMOSTASIS (ARGON PLASMA COAGULATION/BICAP);  Surgeon: Corbin Ade, MD;  Location: [facility];  Service: Endoscopy;  Laterality: N/A;   HOT HEMOSTASIS  04/02/2023   Procedure: HOT HEMOSTASIS (ARGON PLASMA COAGULATION/BICAP);  Surgeon: Franky Macho, MD;  [facility]: [facility];  Service: Endoscopy;;   Ileocolonoscopy  12/03/2011   [id number] diverticulosis. Radiation-induced proctitis status post APC ablation. Status post segmental colon Biopsy   MALONEY DILATION N/A 12/31/2022   Procedure: MALONEY DILATION;  Surgeon: Corbin Ade, MD;  Location: [facility];  Service: Endoscopy;  Laterality: N/A;   TONSILLECTOMY       Allergies  Allergen Reactions   Norvasc [Amlodipine Besylate] Swelling    Ankle swelling   Codeine Nausea And Vomiting   Amoxicillin Rash      Family History  Problem Relation Age of Onset   Heart failure Mother    Heart attack Father    Heart failure Brother        bladder cancer (agent orange exposure)   Cirrhosis Brother        chronic viral hepatitis   Colon cancer Neg Hx      Social History Mr. Lamantia reports that he quit smoking  about 25 years ago. His smoking use included cigarettes. He started smoking about 65 years ago. He has a 60 pack-year smoking history. He has never been exposed to tobacco smoke. He has never used smokeless tobacco. Mr. Revak reports no history of alcohol  use.     Physical Examination Today's Vitals   09/01/23 1330  BP: 126/66  SpO2: 99%  Weight: 149 lb (67.6 kg)  Height: 5\' 6"  (1.676 m)   Body mass index is 24.05 kg/m.  Gen: resting comfortably, no acute distress HEENT: no scleral icterus, pupils equal round and reactive, no palptable cervical adenopathy,  CV: RRR, 3/6 systolic murmur rusb, no jvd Resp: Clear to auscultation bilaterally GI: abdomen is soft, non-tender, non-distended, normal bowel sounds, no hepatosplenomegaly MSK: extremities are warm, no edema.  Skin: warm, no rash Neuro:  no focal deficits Psych: appropriate affect   Diagnostic Studies  07/2012 Echo LVEF 55-60%, no WMA, mild-mod AS (mean grad 11, AVA by planimetry 1.2, continuity area not listed)   07/2014 echo Study Conclusions  - Left ventricle: The cavity size was normal. Wall thickness was   increased in a pattern of mild LVH. Systolic function was normal.   The estimated ejection fraction was in the range of 60% to 65%.   Wall motion was normal; there were no regional wall motion   abnormalities. Doppler parameters are consistent with abnormal   left ventricular relaxation (grade 1 diastolic dysfunction). - Ventricular septum: Septal motion showed abnormal function and   dyssynergy. - Aortic valve: Moderately calcified annulus. Trileaflet;   moderately calcified leaflets. Cusp separation was moderately   reduced. There was moderate stenosis. There was mild   regurgitation. Mean gradient (S): 18 mm Hg. Peak gradient (S): 32   mm Hg. VTI ratio of LVOT to aortic valve: 0.43. Valve area (VTI):   1.25 cm^2. Planimetry valve area 1.5 cm2. - Mitral valve: Moderately calcified annulus. There was trivial   regurgitation. - Left atrium: The atrium was mildly dilated. - Right atrium: Central venous pressure (est): 3 mm Hg. - Atrial septum: No defect or patent foramen ovale was identified. - Tricuspid valve: There was trivial regurgitation. - Pulmonary  arteries: Systolic pressure could not be accurately   estimated. - Pericardium, extracardiac: There was no pericardial effusion.     07/2016 echo Study Conclusions   - Left ventricle: The cavity size was normal. Wall thickness was   increased in a pattern of mild LVH. Systolic function was normal.   The estimated ejection fraction was in the range of 60% to 65%.   Wall motion was normal; there were no regional wall motion   abnormalities. - Aortic valve: Moderately calcified annulus. Moderately calcified   leaflets. There was mild regurgitation. Mean gradient (S): 18 mm   Hg. Peak gradient (S): 36 mm Hg. VTI ratio of LVOT to aortic   valve: 0.38. Valve area (VTI): 0.77 cm^2. Somewhat discordant   information in grading degree of aortic stenosis. Gradients   suggest mild range, however valve appearance and LVOT to AV   ratios are more consistent with moderate to severe aortic   stenosis. - Mitral valve: Moderately calcified annulus. There was trivial   regurgitation. - Right atrium: Central venous pressure (est): 3 mm Hg. - Atrial septum: No defect or patent foramen ovale was identified. - Tricuspid valve: There was trivial regurgitation. - Pulmonary arteries: Systolic pressure could not be accurately   estimated. - Pericardium, extracardiac: A prominent pericardial fat pad was   present.  Impressions:   - Mild LVH with LVEF 60-65%. Grade 2 diastolic dysfunction   suspected. Aortic valve is moderately calcified with at least   moderate calcific aortic stenosis as detailed above, information   is somewhat discordant however. Mild aortic regurgitation.   Moderate mitral annular calcification with trivial mitral   regurgitation. Trivial tricuspid regurgitation.   06/2017 echo Study Conclusions   - Left ventricle: The cavity size was normal. There was severe   concentric hypertrophy. Systolic function was normal. The   estimated ejection fraction was in the range of 60% to 65%.  Wall   motion was normal; there were no regional wall motion   abnormalities. Features are consistent with a pseudonormal left   ventricular filling pattern, with concomitant abnormal relaxation   and increased filling pressure (grade 2 diastolic dysfunction).   Doppler parameters are consistent with high ventricular filling   pressure. - Ventricular septum: Septal motion showed abnormal function and   dyssynergy. 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 - Aortic valve: Moderately to severely calcified annulus.   Trileaflet; moderately calcified leaflets. There was moderate to   severe stenosis. There was mild regurgitation. Peak velocity (S):   325 cm/s. Mean gradient (S): 22 mm Hg. Valve area (VTI): 1 cm^2.   Valve area (Vmax): 0.9 cm^2. Valve area (Vmean): 0.99 cm^2. - Mitral valve: Moderately calcified annulus. - Right ventricle: Systolic function was mildly reduced. - Atrial septum: No defect or patent foramen ovale was identified.   07/2018 echo IMPRESSIONS     1. The left ventricle has hyperdynamic systolic function, with an  ejection fraction of >65%. There is moderate asymmetric septal left  ventricular hypertrophy. Diastolic parameters are consistent with  restrictive filling.   2. The right ventricle has normal systolic function. The cavity was  normal. There is no increase in right ventricular wall thickness. Right  ventricular systolic pressure could not be assessed.   3. The mitral valve is normal in structure. There is moderate mitral  annular calcification present.   4. The aortic root is normal in size and structure.   5. The tricuspid valve is normal in structure.   6. Aortic valve is possibly tricuspid, but leaflet structure not well  defined. Aortic valve regurgitation is mild by color flow Doppler. There  is moderate to more likely severe aortic stenosis with LVOT/AV VTI 0.33  and calculated valve area 0.83 cm2.  Gradients suggest  paradoxical low gradient stenosis in the setting of  normal LVEF.      12/2021 echo 1. The aortic valve is tricuspid. There is severe calcifcation of the  aortic valve. There is severe thickening of the aortic valve. Aortic valve  regurgitation is mild. Severe aortic valve stenosis. Aortic valve mean  gradient measures 35.0 mmHg. Aortic  valve Vmax measures 3.90 m/s. AVA by continuity 0.82cm2, DI 0.24.   2. Left ventricular ejection fraction, by estimation, is 60 to 65%. The  left ventricle has normal function. The left ventricle has no regional  wall motion abnormalities. 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 Diastolic function  indeterminant due to severe MAC.   3. Right ventricular systolic function is normal. The right ventricular  size is normal.   4. Left atrial size was moderately dilated.   5. The mitral valve is degenerative. Trivial mitral valve regurgitation.  Mild calcific mitral stenosis. The mean mitral valve gradient is 4.0 mmHg  at HR 74bpm. Severe mitral annular calcification.  6. The inferior vena cava is normal in size with greater than 50%  respiratory variability, suggesting right atrial pressure of 3 mmHg.      02/2023 echo 1. Left ventricular ejection fraction, by estimation, is 60 to 65%. The  left ventricle has normal function. The left ventricle has no regional  wall motion abnormalities. There is moderate left ventricular hypertrophy.  Left ventricular diastolic  parameters are consistent with Grade I diastolic dysfunction (impaired  relaxation). Elevated left atrial pressure.   2. Right ventricular systolic function is normal. The right ventricular  size is normal. Tricuspid regurgitation signal is inadequate for assessing  PA pressure.   3. The mitral valve is abnormal. Mild mitral valve regurgitation. No  evidence of mitral stenosis. The mean mitral valve gradient is 3.0 mmHg.HR  65 bpm. Moderate mitral annular calcification.    4. The aortic valve was not well visualized. There is severe calcifcation  of the aortic valve. There is severe thickening of the aortic valve.  Aortic valve regurgitation is mild. Severe aortic valve stenosis. Aortic  valve mean gradient measures 39.0  mmHg. Aortic valve peak gradient measures 50.1 mmHg. Aortic valve area, by  VTI measures 0.93 cm.   5. The inferior vena cava is normal in size with greater than 50%  respiratory variability, suggesting right atrial pressure of 3 mmHg.    Assessment and Plan   1. Aortic stenosis - severe AS by echo, normal LVEF. No reproted symptoms but exertion is limited due to recent vertebral fracture wearing a brace and chronic neuropathy. Highest level of activity is walking with his walker at steady pace for 10 minutes around rehab facility, tolerates without symptoms.   - continue to monitor, will update echo as it has been 6 months for ongoing surveillance for severe AS - lung cancer prognosis would impact any future considerations for replacement   2. Chronic sinus tachycardia - long history chronic sinus tach particularly in medical setting - continue to monitor     3. HLD -at goal, continue current meds     Antoine Poche, M.D.

## 2023-09-01 NOTE — Patient Instructions (Signed)
 Medication Instructions:  Your physician recommends that you continue on your current medications as directed. Please refer to the Current Medication list given to you today.  *If you need a refill on your cardiac medications before your next appointment, please call your pharmacy*  Lab Work: None If you have labs (blood work) drawn today and your tests are completely normal, you will receive your results only by: MyChart Message (if you have MyChart) OR A paper copy in the mail If you have any lab test that is abnormal or we need to change your treatment, we will call you to review the results.  Testing/Procedures: Your physician has requested that you have an echocardiogram. Echocardiography is a painless test that uses sound waves to create images of your heart. It provides your doctor with information about the size and shape of your heart and how well your heart's chambers and valves are working. This procedure takes approximately one hour. There are no restrictions for this procedure. Please do NOT wear cologne, perfume, aftershave, or lotions (deodorant is allowed). Please arrive 15 minutes prior to your appointment time.  Please note: We ask at that you not bring children with you during ultrasound (echo/ vascular) testing. Due to room size and safety concerns, children are not allowed in the ultrasound rooms during exams. Our front office staff cannot provide observation of children in our lobby area while testing is being conducted. An adult accompanying a patient to their appointment will only be allowed in the ultrasound room at the discretion of the ultrasound technician under special circumstances. We apologize for any inconvenience.   Follow-Up: At [facility], you and your health needs are our priority.  As part of our continuing mission to provide you with exceptional heart care, our providers are all part of one team.  This team includes your primary Cardiologist  (physician) and Advanced Practice Providers or APPs (Physician Assistants and Nurse Practitioners) who all work together to provide you with the care you need, when you need it.  Your next appointment:   6 month(s)  Provider:   You may see Dina Rich, MD or one of the following Advanced Practice Providers on your designated Care Team:   Randall An, PA-C  Scotesia Zephyr, [place] Jacolyn Reedy, [place]     We recommend signing up for the patient portal called "MyChart".  Sign up information is provided on this After Visit Summary.  MyChart is used to connect with patients for Virtual Visits (Telemedicine).  Patients are able to view lab/test results, encounter notes, upcoming appointments, etc.  Non-urgent messages can be sent to your provider as well.   To learn more about what you can do with MyChart, go to [email or web address].   Other Instructions

## 2023-09-18 ENCOUNTER — Ambulatory Visit (HOSPITAL_COMMUNITY)
Admission: RE | Admit: 2023-09-18 | Discharge: 2023-09-18 | Disposition: A | Discharge status: Home / Self Care | Source: Ambulatory Visit | Attending: Cardiology | Admitting: Cardiology

## 2023-09-18 DIAGNOSIS — I350 Nonrheumatic aortic (valve) stenosis: Secondary | ICD-10-CM | POA: Diagnosis present

## 2023-09-18 LAB — ECHOCARDIOGRAM COMPLETE
AR max vel: 0.91 cm2
AV Area VTI: 0.87 cm2
AV Area mean vel: 0.87 cm2
AV Mean grad: 24 mmHg
AV Peak grad: 39.1 mmHg
Ao pk vel: 3.13 m/s
Area-P 1/2: 6.6 cm2
Calc EF: 44.1 %
MV VTI: 1.83 cm2
P 1/2 time: 379 ms
S' Lateral: 2.6 cm
Single Plane A2C EF: 41.5 %
Single Plane A4C EF: 45.2 %

## 2023-09-23 ENCOUNTER — Ambulatory Visit: Admitting: Urology

## 2023-09-23 ENCOUNTER — Ambulatory Visit: Payer: Medicare Other | Admitting: Urology

## 2023-09-23 VITALS — BP 146/78 | HR 114

## 2023-09-23 DIAGNOSIS — N401 Enlarged prostate with lower urinary tract symptoms: Secondary | ICD-10-CM | POA: Diagnosis not present

## 2023-09-23 DIAGNOSIS — R3912 Poor urinary stream: Secondary | ICD-10-CM | POA: Diagnosis not present

## 2023-09-23 DIAGNOSIS — N138 Other obstructive and reflux uropathy: Secondary | ICD-10-CM | POA: Diagnosis not present

## 2023-09-23 LAB — URINALYSIS, ROUTINE W REFLEX MICROSCOPIC
Bilirubin, UA: NEGATIVE
Glucose, UA: NEGATIVE
Ketones, UA: NEGATIVE
Leukocytes,UA: NEGATIVE
Nitrite, UA: NEGATIVE
Protein,UA: NEGATIVE
RBC, UA: NEGATIVE
Specific Gravity, UA: 1.02 (ref 1.005–1.030)
Urobilinogen, Ur: 0.2 mg/dL (ref 0.2–1.0)
pH, UA: 5.5 (ref 5.0–7.5)

## 2023-09-23 MED ORDER — SILODOSIN 8 MG PO CAPS: 8.0000 mg | ORAL_CAPSULE | Freq: Two times a day (BID) | ORAL | 3 refills | Status: DC

## 2023-09-23 MED ORDER — FINASTERIDE 5 MG PO TABS: 5.0000 mg | ORAL_TABLET | Freq: Every day | ORAL | 3 refills | Status: DC

## 2023-09-23 NOTE — Progress Notes (Signed)
 09/23/2023 1:36 PM   Gomez Lathe 1941/02/19 [id number]  Referring provider: Omie Bickers, MD [address] [place],  [place] [address]  Followup BPH   HPI: Mr Haviland is a 83yo here for followup for BPH with a weak urinary stream. His urine stream remains weak. IPSS 21 QOL 3 on rapaflo  8mg  BID and finasteride  5mg  daily. He has nocturia 2-3x. He had a fall in Nov 2024 and since then his LUTS have worsened. He denies straining to urinate   PMH: Past Medical History:  Diagnosis Date   Aortic stenosis, mod 2021   And insufficiency, evaluation by Dr. Danise Durie in 2000   Arthritis    Benign prostatic hypertrophy    PSA of 1.39 in 09/2010   Chronic kidney disease    Borderline; creatinine of 1.6 in 08/2009, but 1.15 in 05/2010   Chronic sinusitis    By MRI   Diverticulosis 2013   GERD (gastroesophageal reflux disease)    Hearing impaired    Hemorrhoids 2006   Hiatal hernia 2006   History of kidney stones    HOH (hard of hearing)    Hyperlipidemia    Elevated triglycerides   Hypertension    Mild internal carotid artery plaque on MRI/MRA in 2002   Hypothyroidism    IBS (irritable bowel syndrome)    Insomnia    Left bundle branch block 2012   2012   Metabolic syndrome    Fasting hyperglycemia   Murmur    Nephrolithiasis 08/2009   2011   Obesity    Proctitis    radiation induced   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   s/p XRT   Seasonal allergies    Stroke (HCC)    TIA, no residual, seen on MRI   Tobacco abuse, in remission    60 pack years; quit in 1999   Uses hearing aid    bilateral    Surgical History: Past Surgical History:  Procedure Laterality Date   ANKLE FRACTURE SURGERY     left   BIOPSY  12/31/2022   Procedure: BIOPSY;  Surgeon: Suzette Espy, MD;  [facility];  Service: Endoscopy;;   BRONCHIAL BIOPSY  05/14/2023   Procedure: BRONCHIAL BIOPSIES;  Surgeon: Annitta Kindler, MD;  [facility]: [facility];  Service: Pulmonary;;   BRONCHIAL  NEEDLE ASPIRATION BIOPSY  05/14/2023   Procedure: BRONCHIAL NEEDLE ASPIRATION BIOPSIES;  Surgeon: Annitta Kindler, MD;  Location: [facility];  Service: Pulmonary;;   CATARACT EXTRACTION Left    CATARACT EXTRACTION W/PHACO Right 12/19/2013   Procedure: CATARACT EXTRACTION PHACO AND INTRAOCULAR LENS PLACEMENT RIGHT EYE CDE=14.78;  Surgeon: Anner Kill, MD;  Location: [facility];  Service: Ophthalmology;  Laterality: Right;   CHOLECYSTECTOMY     COLONOSCOPY  2006   Dr. Nyoka Bellini mucosa throughout colon, moderate internal hemorrhoids. bx= benign colonic mucosa   COLONOSCOPY  12/03/2011   Dr. Riley Cheadle- colonic diverticulosis, radiation-induced proctitis- s/p APC ablation, no microscopic colitis on bx   COLONOSCOPY WITH PROPOFOL  N/A 06/12/2020   Procedure: COLONOSCOPY WITH PROPOFOL ;  Surgeon: Suzette Espy, MD;  [facility];  Service: Endoscopy;  Laterality: N/A;  2:45pm   ESOPHAGOGASTRODUODENOSCOPY  2006   Dr. Denece Finger- small hiatal hernia and slightly reddened distal esophagus o/w normal   ESOPHAGOGASTRODUODENOSCOPY (EGD) WITH PROPOFOL  N/A 12/31/2022   Procedure: ESOPHAGOGASTRODUODENOSCOPY (EGD) WITH PROPOFOL ;  Surgeon: Suzette Espy, MD;  [facility];  Service: Endoscopy;  Laterality: N/A;  1245pm, asa 3   FLEXIBLE  SIGMOIDOSCOPY N/A 07/13/2013   WUJ:[id number] changes of the rectum with active oozing consistent with chronic radiation proctitis.  Otherwise, negative sigmoidoscopy to 40 cm.Status post argon plasma coagulation ablation    FLEXIBLE SIGMOIDOSCOPY N/A 04/02/2023   Procedure: FLEXIBLE SIGMOIDOSCOPY;  Surgeon: Hargis Lias, MD;  [facility];  Service: Endoscopy;  Laterality: N/A;   HOT HEMOSTASIS N/A 07/13/2013   Procedure: HOT HEMOSTASIS (ARGON PLASMA COAGULATION/BICAP);  Surgeon: Suzette Espy, MD;  [facility];  Service: Endoscopy;  Laterality: N/A;   HOT HEMOSTASIS  04/02/2023   Procedure: HOT HEMOSTASIS (ARGON PLASMA  COAGULATION/BICAP);  Surgeon: Hargis Lias, MD;  [facility];  Service: Endoscopy;;   Ileocolonoscopy  12/03/2011   AOZ:HYQMVHQ diverticulosis. Radiation-induced proctitis status post APC ablation. Status post segmental colon Biopsy   MALONEY DILATION N/A 12/31/2022   Procedure: MALONEY DILATION;  Surgeon: Suzette Espy, MD;  [facility];  Service: Endoscopy;  Laterality: N/A;   TONSILLECTOMY      Home Medications:  Allergies as of 09/23/2023       Reactions   Norvasc  [amlodipine  Besylate] Swelling   Ankle swelling   Codeine Nausea And Vomiting   Amoxicillin Rash        Medication List        Accurate as of September 23, 2023  1:36 PM. If you have any questions, ask your nurse or doctor.          acetaminophen  500 MG tablet Commonly known as: TYLENOL  Take 2 tablets (1,000 mg total) by mouth every 8 (eight) hours as needed for mild pain (pain score 1-3).   acyclovir 800 MG tablet Commonly known as: ZOVIRAX Take 800 mg by mouth every 8 (eight) hours as needed. And give one tablet by mouth once daily.   ALPRAZolam  0.5 MG tablet Commonly known as: XANAX  Take 1 tablet (0.5 mg total) by mouth at bedtime.   alum & mag hydroxide-simeth 400-400-40 MG/5ML suspension Commonly known as: MAALOX PLUS Take 5 mLs by mouth every 4 (four) hours as needed for indigestion.   cyanocobalamin  1000 MCG tablet Commonly known as: VITAMIN B12 Take 2,000 mcg by mouth daily.   docusate sodium  100 MG capsule Commonly known as: COLACE Take 1 capsule (100 mg total) by mouth 2 (two) times daily.   DULoxetine 30 MG capsule Commonly known as: CYMBALTA Take 30 mg by mouth daily.   finasteride  5 MG tablet Commonly known as: PROSCAR  Take 1 tablet (5 mg total) by mouth daily.   fluticasone 0.05 % cream Commonly known as: CUTIVATE SMARTSIG:Topical 1-2 Times Daily PRN   furosemide  40 MG tablet Commonly known as: LASIX  Take 40 mg by mouth daily.   levothyroxine  25  MCG tablet Commonly known as: SYNTHROID  Take 25 mcg by mouth daily.   meclizine  12.5 MG tablet Commonly known as: ANTIVERT  Take 1 tablet (12.5 mg total) by mouth 3 (three) times daily as needed for dizziness.   melatonin 3 MG Tabs tablet Take 3 mg by mouth at bedtime. For sleep   melatonin 5 MG Tabs Take 5 mg by mouth at bedtime. Both 3 and 5 mg is listed at [facility]   methocarbamol  500 MG tablet Commonly known as: ROBAXIN  Take 1 tablet (500 mg total) by mouth every 8 (eight) hours as needed for muscle spasms.   oxyCODONE  5 MG immediate release tablet Commonly known as: Oxy IR/ROXICODONE  Take 1 tablet (5 mg total) by mouth every 6 (six) hours as needed for  severe pain (pain score 7-10).   pantoprazole  40 MG tablet Commonly known as: PROTONIX  Take 1 tablet (40 mg total) by mouth daily before breakfast.   polyethylene glycol 17 g packet Commonly known as: MIRALAX  / GLYCOLAX  Take 17 g by mouth daily. Can be spread to every other day in the setting of diarrhea.   potassium chloride  10 MEQ tablet Commonly known as: KLOR-CON  M Take 1 tablet (10 mEq total) by mouth daily.   psyllium 95 % Pack Commonly known as: HYDROCIL/METAMUCIL Take 1 packet by mouth 2 (two) times daily.   silodosin  8 MG Caps capsule Commonly known as: RAPAFLO  Take 1 capsule (8 mg total) by mouth 2 (two) times daily.   simvastatin  20 MG tablet Commonly known as: ZOCOR  Take 20 mg by mouth every evening.   sucralfate  1 GM/10ML suspension Commonly known as: CARAFATE  20 ml by mouth before mesals for GERD   sucralfate  1 GM/10ML suspension Commonly known as: Carafate  Provide 1 gram enema twice a day.   SYSTANE OP Place 1 drop into both eyes 2 (two) times daily as needed (dry eyes).   traMADol  50 MG tablet Commonly known as: ULTRAM  Take 25-50 mg by mouth 2 (two) times daily as needed.   vitamin C 1000 MG tablet Take 1,000 mg by mouth daily.   Zinc  Oxide 12.8 % ointment Commonly known as: TRIPLE  PASTE Apply 1 Application topically. Every shift.        Allergies:  Allergies  Allergen Reactions   Norvasc  [Amlodipine  Besylate] Swelling    Ankle swelling   Codeine Nausea And Vomiting   Amoxicillin Rash    Family History: Family History  Problem Relation Age of Onset   Heart failure Mother    Heart attack Father    Heart failure Brother        bladder cancer (agent orange exposure)   Cirrhosis Brother        chronic viral hepatitis   Colon cancer Neg Hx     Social History:  reports that he quit smoking about 25 years ago. His smoking use included cigarettes. He started smoking about 65 years ago. He has a 60 pack-year smoking history. He has never been exposed to tobacco smoke. He has never used smokeless tobacco. He reports that he does not drink alcohol and does not use drugs.  ROS: All other review of systems were reviewed and are negative except what is noted above in HPI  Physical Exam: BP (!) 146/78   Pulse (!) 114   Constitutional:  Alert and oriented, No acute distress. HEENT: [place] AT, moist mucus membranes.  Trachea midline, no masses. Cardiovascular: No clubbing, cyanosis, or edema. Respiratory: Normal respiratory effort, no increased work of breathing. GI: Abdomen is soft, nontender, nondistended, no abdominal masses GU: No CVA tenderness.  Lymph: No cervical or inguinal lymphadenopathy. Skin: No rashes, bruises or suspicious lesions. Neurologic: Grossly intact, no focal deficits, moving all 4 extremities. Psychiatric: Normal mood and affect.  Laboratory Data: Lab Results  Component Value Date   WBC 11.1 (H) 05/14/2023   HGB 12.6 (L) 05/14/2023   HCT 39.7 05/14/2023   MCV 91.5 05/14/2023   PLT 324 05/14/2023    Lab Results  Component Value Date   CREATININE 0.94 05/14/2023    No results found for: "PSA"  No results found for: "TESTOSTERONE"  Lab Results  Component Value Date   HGBA1C 5.9 06/06/2010    Urinalysis    Component Value  Date/Time   COLORURINE YELLOW  03/30/2023 1930   APPEARANCEUR Clear 06/24/2023 1614   LABSPEC 1.023 03/30/2023 1930   PHURINE 5.0 03/30/2023 1930   GLUCOSEU Negative 06/24/2023 1614   HGBUR NEGATIVE 03/30/2023 1930   BILIRUBINUR Negative 06/24/2023 1614   KETONESUR NEGATIVE 03/30/2023 1930   PROTEINUR Negative 06/24/2023 1614   PROTEINUR NEGATIVE 03/30/2023 1930   UROBILINOGEN negative (A) 10/19/2019 1444   UROBILINOGEN 0.2 08/26/2009 0930   NITRITE Negative 06/24/2023 1614   NITRITE NEGATIVE 03/30/2023 1930   LEUKOCYTESUR Negative 06/24/2023 1614   LEUKOCYTESUR NEGATIVE 03/30/2023 1930    Lab Results  Component Value Date   LABMICR Comment 06/24/2023   BACTERIA RARE (A) 06/17/2018    Pertinent Imaging:  No results found for this or any previous visit.  No results found for this or any previous visit.  No results found for this or any previous visit.  No results found for this or any previous visit.  Results for orders placed during the hospital encounter of 10/04/21  Ultrasound renal complete  Narrative CLINICAL DATA:  Nephrolithiasis.  EXAM: RENAL / URINARY TRACT ULTRASOUND COMPLETE  COMPARISON:  Renal ultrasound 10/02/2020.  FINDINGS: Right Kidney:  Renal measurements: 11.2 x 4.7 x 5.5 cm = volume: 149 mL. Echogenicity within normal limits. There is no hydronephrosis. There is an exophytic cyst in the inferior pole measuring 4.6 x 4.1 x 4.3 cm, minimally decreased in size compared to the prior study.  Left Kidney:  Renal measurements: 12.6 x 5.0 x 4.8 cm = volume: 156 mL. Echogenicity within normal limits. No mass or hydronephrosis visualized.  Bladder:  Appears normal for degree of bladder distention.  Other:  None.  IMPRESSION: 1. No acute abnormality involving the kidneys. 2. Right renal cyst measuring 4.6 cm.   Electronically Signed By: Tyron Gallon M.D. On: 10/07/2021 17:23  No results found for this or any previous visit.  No  results found for this or any previous visit.  Results for orders placed during the hospital encounter of 06/17/18  CT RENAL STONE STUDY  Narrative CLINICAL DATA:  Right flank pain starting this morning with nausea and vomiting.  EXAM: CT ABDOMEN AND PELVIS WITHOUT CONTRAST  TECHNIQUE: Multidetector CT imaging of the abdomen and pelvis was performed following the standard protocol without IV contrast.  COMPARISON:  08/26/2009  FINDINGS: Lower chest: Mitral annular calcification. Coronary atherosclerosis. No acute finding  Hepatobiliary: No focal liver abnormality.Cholecystectomy with normal common bile duct diameter.  Pancreas: Unremarkable.  Spleen: Unremarkable.  Adrenals/Urinary Tract: Negative adrenals. Mild right hydroureteronephrosis secondary to a 3 mm stone at the iliac vessel crossing. No additional urolithiasis. Chronic and nonspecific perinephric stranding on both sides. Exophytic right lower pole renal cyst. Prominent bladder wall thickness but under distended.  Stomach/Bowel:  No obstruction. No evident bowel inflammation.  Vascular/Lymphatic: Atherosclerotic calcification of the aorta and iliacs. No mass or adenopathy.  Reproductive:Prostatic calcifications and right-sided fiducial markers.  Other: No ascites or pneumoperitoneum.  Musculoskeletal: No acute abnormalities.  Osteopenia.  IMPRESSION: Mild right hydroureteronephrosis due to a 3 mm stone at the level of the iliac crossing.   Electronically Signed By: Aleta Hutch M.D. On: 06/17/2018 07:09   Assessment & Plan:    1. Benign prostatic hyperplasia with urinary obstruction (Primary) Continue rapaflo  8mg  BID and finasteride  5mg  daily - Urinalysis, Routine w reflex microscopic - BLADDER SCAN AMB NON-IMAGING  2. Weak urinary stream Continue rapaflo  8mg  BID and finasteride  5mg  daily.    No follow-ups on file.  Johnie Nailer, MD  [facility]  Urology Selene Dais

## 2023-09-23 NOTE — Progress Notes (Signed)
post void residual=9 °

## 2023-09-24 ENCOUNTER — Inpatient Hospital Stay: Payer: Medicare Other

## 2023-09-24 ENCOUNTER — Encounter: Payer: Self-pay | Admitting: Internal Medicine

## 2023-09-24 ENCOUNTER — Ambulatory Visit (HOSPITAL_COMMUNITY)
Admission: RE | Admit: 2023-09-24 | Discharge: 2023-09-24 | Disposition: A | Discharge status: Home / Self Care | Payer: Medicare Other | Source: Ambulatory Visit | Attending: Hematology | Admitting: Hematology

## 2023-09-24 DIAGNOSIS — C3412 Malignant neoplasm of upper lobe, left bronchus or lung: Secondary | ICD-10-CM | POA: Insufficient documentation

## 2023-09-24 LAB — POCT I-STAT CREATININE: Creatinine, Ser: 1.1 mg/dL (ref 0.61–1.24)

## 2023-09-24 MED ORDER — IOHEXOL 300 MG/ML  SOLN
75.0000 mL | Freq: Once | INTRAMUSCULAR | Status: AC | PRN
  Administered 2023-09-24: 75 mL via INTRAVENOUS

## 2023-09-29 ENCOUNTER — Encounter: Payer: Self-pay | Admitting: Urology

## 2023-09-29 NOTE — Patient Instructions (Signed)

## 2023-10-05 ENCOUNTER — Encounter: Payer: Self-pay | Admitting: Hematology

## 2023-10-05 ENCOUNTER — Inpatient Hospital Stay: Payer: Medicare Other | Attending: Hematology | Admitting: Hematology

## 2023-10-05 VITALS — BP 121/70 | HR 112 | Temp 98.1°F | Resp 18 | Wt 149.7 lb

## 2023-10-05 DIAGNOSIS — C3412 Malignant neoplasm of upper lobe, left bronchus or lung: Secondary | ICD-10-CM | POA: Diagnosis not present

## 2023-10-05 DIAGNOSIS — G629 Polyneuropathy, unspecified: Secondary | ICD-10-CM | POA: Insufficient documentation

## 2023-10-05 DIAGNOSIS — Z8546 Personal history of malignant neoplasm of prostate: Secondary | ICD-10-CM | POA: Insufficient documentation

## 2023-10-05 MED ORDER — GABAPENTIN 100 MG PO CAPS: 100.0000 mg | ORAL_CAPSULE | Freq: Every day | ORAL | 5 refills | Status: DC

## 2023-10-05 NOTE — Patient Instructions (Addendum)
 [facility] at [facility] Discharge Instructions   You were seen and examined today by Dr. Cheree Cords.  He reviewed the results of your lab work which are normal/stable.   He reviewed the results of your CT scan. It is showing that the tumor has responded very well to treatment.   We will see you back in 6 months. We will repeat lab work and a CT scan prior to this visit.   Return as scheduled.    Thank you for choosing [facility] at [facility] to provide your oncology and hematology care.  To afford each patient quality time with our provider, please arrive at least 15 minutes before your scheduled appointment time.   If you have a lab appointment with the Cancer Center please come in thru the Main Entrance and check in at the main information desk.  You need to re-schedule your appointment should you arrive 10 or more minutes late.  We strive to give you quality time with our providers, and arriving late affects you and other patients whose appointments are after yours.  Also, if you no show three or more times for appointments you may be dismissed from the clinic at the providers discretion.     Again, thank you for choosing [facility].  Our hope is that these requests will decrease the amount of time that you wait before being seen by our physicians.       _____________________________________________________________  Should you have questions after your visit to [facility], please contact our office at [phone] and follow the prompts.  Our office hours are 8:00 a.m. and 4:30 p.m. Monday - Friday.  Please note that voicemails left after 4:00 p.m. may not be returned until the following business day.  We are closed weekends and major holidays.  You do have access to a nurse 24-7, just call the main number to the clinic [phone] and do not press any options, hold on the line and a nurse will answer the  phone.    For prescription refill requests, have your pharmacy contact our office and allow 72 hours.    Due to Covid, you will need to wear a mask upon entering the hospital. If you do not have a mask, a mask will be given to you at the Main Entrance upon arrival. For doctor visits, patients may have 1 support person age 47 or older with them. For treatment visits, patients can not have anyone with them due to social distancing guidelines and our immunocompromised population.

## 2023-10-05 NOTE — Progress Notes (Signed)
 [facility] [address] [place] [address]   Clinic Day:  10/05/2023  Referring physician: Omie Bickers, MD  Patient Care Team: Omie Bickers, MD as PCP - General (Internal Medicine) Amanda Jungling Joyceann No, MD as PCP - Cardiology (Cardiology) Riley Cheadle Windsor Hatcher, MD (Gastroenterology) Paulett Boros, MD as Medical Oncologist (Medical Oncology) Gerhard Knuckles, RN as Oncology Nurse Navigator (Medical Oncology)   ASSESSMENT & PLAN:   Assessment:  1.  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  Spent 6 weeks in rehab in [place]. - CT chest as part of workup showed 2.4 cm left upper lobe spiculated nodule. - PET scan (04/28/2023): Irregular 2.3 x 1.7 cm anterior left upper lobe nodule with SUV 13.1.  1 cm subcarinal lymph node with maximum SUV 3.6, mildly above blood pool. - Bronchoscopy and biopsy by Dr. Lucina Sabal - Pathology (05/14/2023): LUL FNA squamous cell carcinoma.  FNA of lymph nodes 7, 4L and 11L with no malignant cells identified. - Status post SBRT 5 treatments completed at [facility]  2.  Social/family history: - Lives with wife at home.  Uses walker for ambulation since fall.  Worked as a Insurance account manager at [employer].  No chemical/asbestos exposure.  Quit smoking cigarettes on 10/24/1997.  Smoked 2 packs/day starting at age 56. - Brother had agent orange exposure in [place] and had bladder cancer.  3.  Prostate cancer: - Diagnosed in 2012, status post XRT in [place].  Plan:  1.  Stage I (T1 cN0 M0) squamous cell carcinoma of the left upper lobe of the lung: - He reports that he completed SBRT, 5 treatments at [facility] approximately 2 months ago. - CT chest (09/24/2023): Treated left upper lobe lung mass measures 1.4 x 0.9 cm, previously 2.3 x 1.6 cm.  No evidence of other lesions.  No adenopathy. - Recommend  follow-up in 6 months with repeat CT scan of the chest.  2.  Peripheral neuropathy: - He reports that his feet and legs hurt at nighttime and he has to wake up 3 times every night because of pain.  He is on Cymbalta 30 mg daily. - He reportedly took gabapentin  which helped him in the past.  As he fell while he was taking gabapentin  and hydrocodone , it was discontinued.  He is no longer taking hydrocodone .  I will give him low-dose gabapentin  100 mg at bedtime as needed.  I have advised him to be careful when he gets up in the middle of the night and use a cane.   Orders Placed This Encounter  Procedures   CT CHEST W CONTRAST    Standing Status:   Future    Expected Date:   04/06/2024    Expiration Date:   10/04/2024    If indicated for the ordered procedure, I authorize the administration of contrast media per Radiology protocol:   Yes    Does the patient have a contrast media/X-ray dye allergy?:   No    Preferred imaging location?:   [facility] R Teague,acting as a scribe for Paulett Boros, MD.,have documented all relevant documentation on the behalf of Paulett Boros, MD,as directed by  Paulett Boros, MD while in the presence of Paulett Boros, MD.   I, Paulett Boros MD, have reviewed the above documentation for accuracy and  completeness, and I agree with the above.    Paulett Boros, MD   5/12/20252:58 PM  CHIEF COMPLAINT/PURPOSE OF CONSULT:   Diagnosis: LUL squamous cell lung cancer   Cancer Staging  Squamous cell carcinoma of upper lobe of left lung (HCC) Staging form: Lung, AJCC V9 - Clinical stage from 05/31/2023: Stage IA3 (cT1c, cN0(f), cM0) - Unsigned    Prior Therapy: SBRT  Current Therapy: Observation   HISTORY OF PRESENT ILLNESS:   Oncology History   No history exists.      Calvin Moreno is a 83 y.o. male presenting to clinic today for evaluation of LUL squamous cell lung cancer at the request of Dr.  Lucina Sabal.  He initially presented to his PCP with SOB with cough for several months. He underwent a chest x-ray on 03/05/23 showing a questionable 19 mm opacity in LUL. He was later referred to the ED on 03/30/23 following a fall and overall decline in health. A chest CT was performed in the hospital and showed: 2.4 cm nodule in anterior LUL, highly suspicious; contralateral 15 mm nodule along posterior RUL; small mediastinal nodes, indeterminate.  He was referred to pulmonology after being discharged from the hospital to a nursing home. He underwent PET scan on 04/28/23 for further work up showing: hypermetabolism to 2.3 cm anterior LUL nodule; nonspecific 1 cm subcarinal node; no findings of distant/extrathoracic metastatic disease. He proceeded to bronchoscopy with biopsies on 05/14/23 under Dr. Lucina Sabal. Cytology from the LUL confirmed squamous cell carcinoma. FNA of three lymph nodes (levels 4, 7, and 11) were all negative.  Of note, he has a history of prostate cancer, s/p XRT in 2012.  Today, he states that he is doing well overall. His appetite level is at 70%. His energy level is at 70%.  INTERVAL HISTORY:   Calvin Moreno is a 83 y.o. male presenting to the clinic today for follow-up of Squamous cell carcinoma of LUL. He was last seen by me on 06/01/23 in consultation.  Since his last visit, he underwent CT chest on 09/24/23 that found: Interval decrease in size of the dominant spiculated left upper lobe mass consistent with response to therapy. No evidence of metastatic disease. Stable ill-defined subpleural nodularity posteriorly in the right upper lobe along the major fissure, likely postinflammatory. Coronary artery atherosclerosis and aortic valvular calcifications.  MRI brain from 06/08/23 showed: Intermittently motion degraded examination as described. Within this limitation, findings are as follows. No evidence of intracranial metastatic disease. Mild chronic small vessel ischemic changes  within the cerebral white matter. Small chronic infarct within the left cerebellar hemisphere. Moderate generalized cerebral atrophy. Paranasal sinus disease as described. Left middle ear/mastoid effusion.  Today, he states that he is doing well overall. His appetite level is at 100%. His energy level is at 75%.   PAST MEDICAL HISTORY:   Past Medical History: Past Medical History:  Diagnosis Date   Aortic stenosis, mod 2021   And insufficiency, evaluation by Dr. Danise Durie in 2000   Arthritis    Benign prostatic hypertrophy    PSA of 1.39 in 09/2010   Chronic kidney disease    Borderline; creatinine of 1.6 in 08/2009, but 1.15 in 05/2010   Chronic sinusitis    By MRI   Diverticulosis 2013   GERD (gastroesophageal reflux disease)    Hearing impaired    Hemorrhoids 2006   Hiatal hernia 2006   History of kidney stones    HOH (hard of hearing)    Hyperlipidemia  Elevated triglycerides   Hypertension    Mild internal carotid artery plaque on MRI/MRA in 2002   Hypothyroidism    IBS (irritable bowel syndrome)    Insomnia    Left bundle branch block 2012   2012   Metabolic syndrome    Fasting hyperglycemia   Murmur    Nephrolithiasis 08/2009   2011   Obesity    Proctitis    radiation induced   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   s/p XRT   Seasonal allergies    Stroke (HCC)    TIA, no residual, seen on MRI   Tobacco abuse, in remission    60 pack years; quit in 1999   Uses hearing aid    bilateral    Surgical History: Past Surgical History:  Procedure Laterality Date   ANKLE FRACTURE SURGERY     left   BIOPSY  12/31/2022   Procedure: BIOPSY;  Surgeon: Suzette Espy, MD;  [facility];  Service: Endoscopy;;   BRONCHIAL BIOPSY  05/14/2023   Procedure: BRONCHIAL BIOPSIES;  Surgeon: Annitta Kindler, MD;  Location: [facility];  Service: Pulmonary;;   BRONCHIAL NEEDLE ASPIRATION BIOPSY  05/14/2023   Procedure: BRONCHIAL NEEDLE ASPIRATION BIOPSIES;  Surgeon:  Annitta Kindler, MD;  Location: [facility];  Service: Pulmonary;;   CATARACT EXTRACTION Left    CATARACT EXTRACTION W/PHACO Right 12/19/2013   Procedure: CATARACT EXTRACTION PHACO AND INTRAOCULAR LENS PLACEMENT RIGHT EYE CDE=14.78;  Surgeon: Anner Kill, MD;  Location: [facility];  Service: Ophthalmology;  Laterality: Right;   CHOLECYSTECTOMY     COLONOSCOPY  2006   Dr. Nyoka Bellini mucosa throughout colon, moderate internal hemorrhoids. bx= benign colonic mucosa   COLONOSCOPY  12/03/2011   Dr. Riley Cheadle- colonic diverticulosis, radiation-induced proctitis- s/p APC ablation, no microscopic colitis on bx   COLONOSCOPY WITH PROPOFOL  N/A 06/12/2020   Procedure: COLONOSCOPY WITH PROPOFOL ;  Surgeon: Suzette Espy, MD;  Location: [facility];  Service: Endoscopy;  Laterality: N/A;  2:45pm   ESOPHAGOGASTRODUODENOSCOPY  2006   Dr. Denece Finger- small hiatal hernia and slightly reddened distal esophagus o/w normal   ESOPHAGOGASTRODUODENOSCOPY (EGD) WITH PROPOFOL  N/A 12/31/2022   Procedure: ESOPHAGOGASTRODUODENOSCOPY (EGD) WITH PROPOFOL ;  Surgeon: Suzette Espy, MD;  [facility]: [facility];  Service: Endoscopy;  Laterality: N/A;  1245pm, asa 3   FLEXIBLE SIGMOIDOSCOPY N/A 07/13/2013   WUJ:[id number] changes of the rectum with active oozing consistent with chronic radiation proctitis.  Otherwise, negative sigmoidoscopy to 40 cm.Status post argon plasma coagulation ablation    FLEXIBLE SIGMOIDOSCOPY N/A 04/02/2023   Procedure: FLEXIBLE SIGMOIDOSCOPY;  Surgeon: Hargis Lias, MD;  [facility];  Service: Endoscopy;  Laterality: N/A;   HOT HEMOSTASIS N/A 07/13/2013   Procedure: HOT HEMOSTASIS (ARGON PLASMA COAGULATION/BICAP);  Surgeon: Suzette Espy, MD;  [facility];  Service: Endoscopy;  Laterality: N/A;   HOT HEMOSTASIS  04/02/2023   Procedure: HOT HEMOSTASIS (ARGON PLASMA COAGULATION/BICAP);  Surgeon: Hargis Lias, MD;  [facility];  Service: Endoscopy;;    Ileocolonoscopy  12/03/2011   AOZ:HYQMVHQ diverticulosis. Radiation-induced proctitis status post APC ablation. Status post segmental colon Biopsy   MALONEY DILATION N/A 12/31/2022   Procedure: MALONEY DILATION;  Surgeon: Suzette Espy, MD;  [facility];  Service: Endoscopy;  Laterality: N/A;   TONSILLECTOMY      Social History: Social History   Socioeconomic History   Marital status: Married    Spouse name: Not on file   Number of children: 2  Years of education: Not on file   Highest education level: Not on file  Occupational History   Occupation: Retired    Associate Professor: [employer]  Tobacco Use   Smoking status: Former    Current packs/day: 0.00    Average packs/day: 1.5 packs/day for 40.0 years (60.0 ttl pk-yrs)    Types: Cigarettes    Start date: 11/04/1957    Quit date: 11/04/1997    Years since quitting: 25.9    Passive exposure: Never   Smokeless tobacco: Never   Tobacco comments:    Quit in 1999  Vaping Use   Vaping status: Never Used  Substance and Sexual Activity   Alcohol use: No    Alcohol/week: 0.0 standard drinks of alcohol   Drug use: No   Sexual activity: Yes  Other Topics Concern   Not on file  Social History Narrative   Married with 2 children   Lives locally   No regular exercise   Social Drivers of Health   Financial Resource Strain: Not on file  Food Insecurity: No Food Insecurity (08/14/2023)   Received [facility]   Hunger Vital Sign    Worried About Running Out of Food in the Last Year: Never true    Ran Out of Food in the Last Year: Never true  Transportation Needs: No Transportation Needs (08/14/2023)   Received from [facility]   PRAPARE - Transportation    Lack of Transportation (Medical): No    Lack of Transportation (Non-Medical): No  Physical Activity: Not on file  Stress: Not on file  Social Connections: Not on file  Intimate Partner Violence: Not At Risk (03/30/2023)   Humiliation, Afraid, Rape,  and Kick questionnaire    Fear of Current or Ex-Partner: No    Emotionally Abused: No    Physically Abused: No    Sexually Abused: No    Family History: Family History  Problem Relation Age of Onset   Heart failure Mother    Heart attack Father    Heart failure Brother        bladder cancer (agent orange exposure)   Cirrhosis Brother        chronic viral hepatitis   Colon cancer Neg Hx     Current Medications:  Current Outpatient Medications:    acetaminophen  (TYLENOL ) 500 MG tablet, Take 2 tablets (1,000 mg total) by mouth every 8 (eight) hours as needed for mild pain (pain score 1-3)., Disp: 100 tablet, Rfl: 2   acyclovir (ZOVIRAX) 800 MG tablet, Take 800 mg by mouth every 8 (eight) hours as needed. And give one tablet by mouth once daily., Disp: , Rfl:    ALPRAZolam  (XANAX ) 0.5 MG tablet, Take 1 tablet (0.5 mg total) by mouth at bedtime., Disp: 30 tablet, Rfl: 0   alum & mag hydroxide-simeth (MAALOX PLUS) 400-400-40 MG/5ML suspension, Take 5 mLs by mouth every 4 (four) hours as needed for indigestion., Disp: , Rfl:    Ascorbic Acid (VITAMIN C) 1000 MG tablet, Take 1,000 mg by mouth daily., Disp: , Rfl:    docusate sodium  (COLACE) 100 MG capsule, Take 1 capsule (100 mg total) by mouth 2 (two) times daily., Disp: 10 capsule, Rfl: 0   DULoxetine (CYMBALTA) 30 MG capsule, Take 30 mg by mouth daily., Disp: , Rfl:    finasteride  (PROSCAR ) 5 MG tablet, Take 1 tablet (5 mg total) by mouth daily., Disp: 90 tablet, Rfl: 3   fluticasone (CUTIVATE) 0.05 % cream, SMARTSIG:Topical 1-2 Times  Daily PRN, Disp: , Rfl:    furosemide  (LASIX ) 40 MG tablet, Take 40 mg by mouth daily., Disp: , Rfl:    gabapentin  (NEURONTIN ) 100 MG capsule, Take 1 capsule (100 mg total) by mouth at bedtime., Disp: 30 capsule, Rfl: 5   levothyroxine  (SYNTHROID ) 25 MCG tablet, Take 25 mcg by mouth daily., Disp: , Rfl:    meclizine  (ANTIVERT ) 12.5 MG tablet, Take 1 tablet (12.5 mg total) by mouth 3 (three) times daily as  needed for dizziness., Disp: , Rfl:    Melatonin 3 MG TABS, Take 3 mg by mouth at bedtime. For sleep, Disp: , Rfl:    melatonin 5 MG TABS, Take 5 mg by mouth at bedtime. Both 3 and 5 mg is listed at [facility], Disp: , Rfl:    methocarbamol  (ROBAXIN ) 500 MG tablet, Take 1 tablet (500 mg total) by mouth every 8 (eight) hours as needed for muscle spasms., Disp: , Rfl:    oxyCODONE  (OXY IR/ROXICODONE ) 5 MG immediate release tablet, Take 1 tablet (5 mg total) by mouth every 6 (six) hours as needed for severe pain (pain score 7-10)., Disp: 30 tablet, Rfl: 0   pantoprazole  (PROTONIX ) 40 MG tablet, Take 1 tablet (40 mg total) by mouth daily before breakfast., Disp: 90 tablet, Rfl: 3   Polyethyl Glycol-Propyl Glycol (SYSTANE OP), Place 1 drop into both eyes 2 (two) times daily as needed (dry eyes)., Disp: , Rfl:    polyethylene glycol (MIRALAX  / GLYCOLAX ) 17 g packet, Take 17 g by mouth daily. Can be spread to every other day in the setting of diarrhea., Disp: , Rfl:    potassium chloride  (KLOR-CON  M) 10 MEQ tablet, Take 1 tablet (10 mEq total) by mouth daily., Disp: 30 tablet, Rfl: 0   psyllium (HYDROCIL/METAMUCIL) 95 % PACK, Take 1 packet by mouth 2 (two) times daily., Disp: , Rfl:    silodosin  (RAPAFLO ) 8 MG CAPS capsule, Take 1 capsule (8 mg total) by mouth 2 (two) times daily., Disp: 180 capsule, Rfl: 3   simvastatin  (ZOCOR ) 20 MG tablet, Take 20 mg by mouth every evening., Disp: , Rfl:    sucralfate  (CARAFATE ) 1 GM/10ML suspension, Provide 1 gram enema twice a day., Disp: , Rfl:    sucralfate  (CARAFATE ) 1 GM/10ML suspension, 20 ml by mouth before mesals for GERD, Disp: , Rfl:    traMADol  (ULTRAM ) 50 MG tablet, Take 25-50 mg by mouth 2 (two) times daily as needed., Disp: , Rfl:    vitamin B-12 (CYANOCOBALAMIN ) 1000 MCG tablet, Take 2,000 mcg by mouth daily., Disp: , Rfl:    Zinc  Oxide (TRIPLE PASTE) 12.8 % ointment, Apply 1 Application topically. Every shift., Disp: , Rfl:    Allergies: Allergies   Allergen Reactions   Norvasc  [Amlodipine  Besylate] Swelling    Ankle swelling   Codeine Nausea And Vomiting   Amoxicillin Rash    REVIEW OF SYSTEMS:   Review of Systems  Constitutional:  Negative for chills, fatigue and fever.  HENT:   Negative for lump/mass, mouth sores, nosebleeds, sore throat and trouble swallowing.   Eyes:  Negative for eye problems.  Respiratory:  Negative for cough and shortness of breath.   Cardiovascular:  Negative for chest pain, leg swelling and palpitations.  Gastrointestinal:  Positive for constipation and diarrhea. Negative for abdominal pain, nausea and vomiting.  Genitourinary:  Negative for bladder incontinence, difficulty urinating, dysuria, frequency, hematuria and nocturia.   Musculoskeletal:  Negative for arthralgias, back pain, flank pain, myalgias and neck pain.  Skin:  Negative for itching and rash.  Neurological:  Positive for numbness. Negative for dizziness and headaches.  Hematological:  Does not bruise/bleed easily.  Psychiatric/Behavioral:  Positive for depression and sleep disturbance. Negative for suicidal ideas. The patient is not nervous/anxious.   All other systems reviewed and are negative.    VITALS:   Blood pressure 121/70, pulse (!) 112, temperature 98.1 F (36.7 C), temperature source Oral, resp. rate 18, weight 149 lb 11.1 oz (67.9 kg), SpO2 98%.  Wt Readings from Last 3 Encounters:  10/05/23 149 lb 11.1 oz (67.9 kg)  09/01/23 149 lb (67.6 kg)  06/01/23 144 lb (65.3 kg)    Body mass index is 24.16 kg/m.  Performance status (ECOG): 1 - Symptomatic but completely ambulatory  PHYSICAL EXAM:   Physical Exam Vitals and nursing note reviewed. Exam conducted with a chaperone present.  Constitutional:      Appearance: Normal appearance.  Cardiovascular:     Rate and Rhythm: Normal rate and regular rhythm.     Pulses: Normal pulses.     Heart sounds: Normal heart sounds.  Pulmonary:     Effort: Pulmonary effort is  normal.     Breath sounds: Normal breath sounds.  Abdominal:     Palpations: Abdomen is soft. There is no hepatomegaly, splenomegaly or mass.     Tenderness: There is no abdominal tenderness.  Musculoskeletal:     Right lower leg: No edema.     Left lower leg: No edema.  Lymphadenopathy:     Cervical: No cervical adenopathy.     Right cervical: No superficial, deep or posterior cervical adenopathy.    Left cervical: No superficial, deep or posterior cervical adenopathy.     Upper Body:     Right upper body: No supraclavicular or axillary adenopathy.     Left upper body: No supraclavicular or axillary adenopathy.  Neurological:     General: No focal deficit present.     Mental Status: He is alert and oriented to person, place, and time.  Psychiatric:        Mood and Affect: Mood normal.        Behavior: Behavior normal.     LABS:   CBC    Component Value Date/Time   WBC 11.1 (H) 05/14/2023 0625   RBC 4.34 05/14/2023 0625   HGB 12.6 (L) 05/14/2023 0625   HCT 39.7 05/14/2023 0625   PLT 324 05/14/2023 0625   MCV 91.5 05/14/2023 0625   MCH 29.0 05/14/2023 0625   MCHC 31.7 05/14/2023 0625   RDW 12.0 05/14/2023 0625   LYMPHSABS 1.0 03/30/2023 1817   MONOABS 0.7 03/30/2023 1817   EOSABS 0.2 03/30/2023 1817   BASOSABS 0.0 03/30/2023 1817    CMP    Component Value Date/Time   NA 137 05/14/2023 0625   NA 137 04/06/2023 0000   K 4.6 05/14/2023 0625   CL 100 05/14/2023 0625   CO2 30 05/14/2023 0625   GLUCOSE 151 (H) 05/14/2023 0625   BUN 8 05/14/2023 0625   BUN 8 04/06/2023 0000   CREATININE 1.10 09/24/2023 1252   CREATININE 1.16 10/02/2014 0817   CALCIUM 8.9 05/14/2023 0625   PROT 5.1 (L) 04/01/2023 0505   ALBUMIN 2.3 (L) 04/01/2023 0505   AST 16 04/01/2023 0505   ALT 19 04/01/2023 0505   ALKPHOS 107 04/01/2023 0505   BILITOT 0.9 04/01/2023 0505   GFRNONAA >60 05/14/2023 0625   GFRAA >60 06/17/2018 0626     No results found  for: "CEA1", "CEA" / No results  found for: "CEA1", "CEA" No results found for: "PSA1" No results found for: "QMV784" No results found for: "CAN125"  No results found for: "TOTALPROTELP", "ALBUMINELP", "A1GS", "A2GS", "BETS", "BETA2SER", "GAMS", "MSPIKE", "SPEI" No results found for: "TIBC", "FERRITIN", "IRONPCTSAT" No results found for: "LDH"   STUDIES:   CT Chest W Contrast Result Date: 10/03/2023 CLINICAL DATA:  Non-small cell lung cancer, monitor. History of robot assisted bronchoscopy in December 2024. * Tracking Code: BO * EXAM: CT CHEST WITH CONTRAST TECHNIQUE: Multidetector CT imaging of the chest was performed during intravenous contrast administration. RADIATION DOSE REDUCTION: This exam was performed according to the departmental dose-optimization program which includes automated exposure control, adjustment of the mA and/or kV according to patient size and/or use of iterative reconstruction technique. CONTRAST:  75mL OMNIPAQUE  IOHEXOL  300 MG/ML  SOLN COMPARISON:  Chest CT 04/28/2023 and 03/31/2023.  PET-CT 04/28/2023. FINDINGS: Cardiovascular: No acute vascular findings are demonstrated. Again demonstrated is extensive atherosclerosis of the aorta, great vessels and coronary arteries. There are prominent calcifications of the aortic valve and mitral annulus. The heart size is normal. There is no pericardial effusion. Mediastinum/Nodes: There are no enlarged mediastinal, hilar or axillary lymph nodes.Unchanged 9 mm short axis subcarinal lymph node on image 67/2, not hypermetabolic on PET-CT. The thyroid  gland, trachea and esophagus demonstrate no significant findings. Lungs/Pleura: No pleural effusion or pneumothorax. Stable mild centrilobular emphysema. The previously demonstrated dominant spiculated left upper lobe mass which was hypermetabolic on PET-CT has significantly decreased in size in the interval, now measuring 1.4 x 0.9 cm on image 40/3 (previously 2.3 x 1.6 cm). Ill-defined subpleural nodularity posteriorly in  the right upper lobe along the major fissure is unchanged. No new or enlarging nodules are identified. There is a calcified right upper lobe granuloma and scattered pulmonary scarring bilaterally. Upper abdomen: No acute findings are identified in the visualized upper abdomen. There are stable postsurgical changes from previous cholecystectomy. Aortic and branch vessel atherosclerosis noted. Musculoskeletal/Chest wall: There is no chest wall mass or suspicious osseous finding. Mild spondylosis. Stable mild chronic anterior wedging of the T8 vertebral body. IMPRESSION: 1. Interval decrease in size of the dominant spiculated left upper lobe mass consistent with response to therapy. 2. No evidence of metastatic disease. 3. Stable ill-defined subpleural nodularity posteriorly in the right upper lobe along the major fissure, likely postinflammatory. 4. Coronary artery atherosclerosis and aortic valvular calcifications. 5. Aortic Atherosclerosis (ICD10-I70.0) and Emphysema (ICD10-J43.9). Electronically Signed   By: Elmon Hagedorn M.D.   On: 10/03/2023 13:15   ECHOCARDIOGRAM COMPLETE Result Date: 09/18/2023    ECHOCARDIOGRAM REPORT   Patient Name:   ACENCION TONI Date of Exam: 09/18/2023 Medical Rec #:  [id number]        Height:       66.0 in Accession #:    [id number]       Weight:       149.0 lb Date of Birth:  Jan 06, 1941        BSA:          1.765 m Patient Age:    82 years         BP:           122/68 mmHg Patient Gender: M                HR:           111 bpm. Exam Location:  [facility] Procedure: 2D Echo, Cardiac Doppler and Color Doppler (Both  Spectral and Color            Flow Doppler were utilized during procedure). Indications:    I35.0 Nonrheumatic aortic (valve) stenosis  History:        Patient has prior history of Echocardiogram examinations, most                 recent 03/11/2023. Aortic Valve Disease and Mitral Valve                 Disease, Arrythmias:LBBB, Signs/Symptoms:Murmur; Risk                  Factors:Hypertension, Dyslipidemia and Former Smoker.  Sonographer:    Reed Canes RCS, RVS Referring Phys: [id number] Joyceann No BRANCH IMPRESSIONS  1. Left ventricular ejection fraction, by estimation, is 55 to 60%. The left ventricle has normal function. The left ventricle has no regional wall motion abnormalities. There is moderate left ventricular hypertrophy. Left ventricular diastolic parameters are indeterminate.  2. Right ventricular systolic function is normal. The right ventricular size is normal. Tricuspid regurgitation signal is inadequate for assessing PA pressure.  3. Left atrial size was mildly dilated.  4. The mitral valve is abnormal. No evidence of mitral valve regurgitation. Moderate mitral stenosis. The mean mitral valve gradient is 9.5 mmHg.  5. Paradoxical low flow low gradient moderate to severe aortic stenosis. Mean gradient 24 mmHg, AVI VTI 0.87, DI 0.28, SVI 29. Angle of acquisition may also lead to underestimation of AV gradients. . The aortic valve was not well visualized. There is moderate calcification of the aortic valve. There is moderate thickening of the aortic valve. Aortic valve regurgitation is mild to moderate.  6. The inferior vena cava is normal in size with greater than 50% respiratory variability, suggesting right atrial pressure of 3 mmHg. Comparison(s): EF 60-65%. Moderate LVH. Mild MR. Severe aortic valve stenosis-mean pg 39 mmHg. FINDINGS  Left Ventricle: Left ventricular ejection fraction, by estimation, is 55 to 60%. The left ventricle has normal function. The left ventricle has no regional wall motion abnormalities. The left ventricular internal cavity size was normal in size. There is  moderate left ventricular hypertrophy. Left ventricular diastolic parameters are indeterminate. Right Ventricle: The right ventricular size is normal. Right vetricular wall thickness was not well visualized. Right ventricular systolic function is normal. Tricuspid regurgitation signal  is inadequate for assessing PA pressure. Left Atrium: Left atrial size was mildly dilated. Right Atrium: Right atrial size was normal in size. Pericardium: There is no evidence of pericardial effusion. Mitral Valve: The mitral valve is abnormal. There is mild thickening of the mitral valve leaflet(s). There is mild calcification of the mitral valve leaflet(s). Mild mitral annular calcification. No evidence of mitral valve regurgitation. Moderate mitral  valve stenosis. MV peak gradient, 20.9 mmHg. The mean mitral valve gradient is 9.5 mmHg. Tricuspid Valve: The tricuspid valve is normal in structure. Tricuspid valve regurgitation is not demonstrated. No evidence of tricuspid stenosis. Aortic Valve: Paradoxical low flow low gradient moderate to severe aortic stenosis. Mean gradient 24 mmHg, AVI VTI 0.87, DI 0.28, SVI 29. Angle of acquisition may also lead to underestimation of AV gradients. The aortic valve was not well visualized. There is moderate calcification of the aortic valve. There is moderate thickening of the aortic valve. There is moderate aortic valve annular calcification. Aortic valve regurgitation is mild to moderate. Aortic regurgitation PHT measures 379 msec. Aortic valve mean gradient measures 24.0 mmHg. Aortic valve peak gradient measures 39.1 mmHg. Aortic valve area,  by VTI measures 0.87 cm. Pulmonic Valve: The pulmonic valve was not well visualized. Pulmonic valve regurgitation is not visualized. No evidence of pulmonic stenosis. Aorta: The aortic root and ascending aorta are structurally normal, with no evidence of dilitation. Venous: The inferior vena cava is normal in size with greater than 50% respiratory variability, suggesting right atrial pressure of 3 mmHg. IAS/Shunts: No atrial level shunt detected by color flow Doppler. Additional Comments: 3D was performed not requiring image post processing on an independent workstation and was indeterminate.  LEFT VENTRICLE PLAX 2D LVIDd:          3.20 cm     Diastology LVIDs:         2.60 cm     LV e' medial:    4.79 cm/s LV PW:         1.30 cm     LV E/e' medial:  42.2 LV IVS:        1.40 cm     LV e' lateral:   5.66 cm/s LVOT diam:     2.00 cm     LV E/e' lateral: 35.7 LV SV:         51 LV SV Index:   29 LVOT Area:     3.14 cm  LV Volumes (MOD) LV vol d, MOD A2C: 33.5 ml LV vol d, MOD A4C: 40.9 ml LV vol s, MOD A2C: 19.6 ml LV vol s, MOD A4C: 22.4 ml LV SV MOD A2C:     13.9 ml LV SV MOD A4C:     40.9 ml LV SV MOD BP:      16.7 ml RIGHT VENTRICLE             IVC RV Basal diam:  2.80 cm     IVC diam: 1.50 cm RV Mid diam:    2.60 cm RV S prime:     14.10 cm/s TAPSE (M-mode): 2.4 cm LEFT ATRIUM             Index        RIGHT ATRIUM           Index LA diam:        3.70 cm 2.10 cm/m   RA Area:     13.30 cm LA Vol (A2C):   82.3 ml 46.64 ml/m  RA Volume:   32.80 ml  18.59 ml/m LA Vol (A4C):   43.0 ml 24.37 ml/m LA Biplane Vol: 62.4 ml 35.36 ml/m  AORTIC VALVE                     PULMONIC VALVE AV Area (Vmax):    0.91 cm      PV Vmax:       1.04 m/s AV Area (Vmean):   0.87 cm      PV Peak grad:  4.3 mmHg AV Area (VTI):     0.87 cm AV Vmax:           312.50 cm/s AV Vmean:          233.000 cm/s AV VTI:            0.589 m AV Peak Grad:      39.1 mmHg AV Mean Grad:      24.0 mmHg LVOT Vmax:         91.00 cm/s LVOT Vmean:        64.300 cm/s LVOT VTI:          0.163 m LVOT/AV VTI ratio:  0.28 AI PHT:            379 msec  AORTA Ao Root diam: 3.60 cm Ao Asc diam:  3.20 cm MITRAL VALVE MV Area (PHT): 6.60 cm     SHUNTS MV Area VTI:   1.83 cm     Systemic VTI:  0.16 m MV Peak grad:  20.9 mmHg    Systemic Diam: 2.00 cm MV Mean grad:  9.5 mmHg MV Vmax:       2.28 m/s MV Vmean:      139.0 cm/s MV Decel Time: 115 msec MV E velocity: 202.00 cm/s MV A velocity: 65.10 cm/s MV E/A ratio:  3.10 Armida Lander MD Electronically signed by Armida Lander MD Signature Date/Time: 09/18/2023/3:49:14 PM    Final

## 2023-10-29 ENCOUNTER — Ambulatory Visit: Payer: Self-pay | Admitting: Cardiology

## 2023-11-19 ENCOUNTER — Telehealth: Payer: Self-pay

## 2023-11-19 ENCOUNTER — Telehealth: Payer: Self-pay | Admitting: Cardiology

## 2023-11-19 NOTE — Telephone Encounter (Signed)
 Follow Up:   She is checking on the status of patient's clearance.

## 2023-11-19 NOTE — Telephone Encounter (Signed)
   Pre-operative Risk Assessment    Patient Name: Calvin Moreno  DOB: [birth year] MRN: [id number]   Date of last office visit: 09/01/23 DORN ROSS, MD Date of next office visit: 03/31/24 DORN ROSS, MD  Request for Surgical Clearance    Procedure:  2 FILLINGS AND 1 CROWN  Date of Surgery:  Clearance TBD                                Surgeon:  NOT INDICATED Surgeon's Group or Practice Name:  [facility] Phone number:  [phone] Fax number:  [phone]   Type of Clearance Requested:   - Medical    Type of Anesthesia:  Local    Additional requests/questions:    SignedLucie DELENA Ku   11/19/2023, 2:53 PM

## 2023-11-19 NOTE — Telephone Encounter (Signed)
 Spoke with surgeons office and informed them that our office has not received the preop clearance request.  Asked them to resend the clearance request to fax # [phone]. Let them know that as soon as we get the request we will start the process.

## 2023-11-20 NOTE — Telephone Encounter (Signed)
   Name: Calvin Moreno  DOB: Mar 11, 1941  MRN: [id number]  Primary Cardiologist: Alvan Carrier, MD   Preoperative team, please contact this patient and set up a phone call appointment for further preoperative risk assessment. Please obtain consent and complete medication review. Thank you for your help. Needs two fillings and a crown.  Low risk but no specific guidelines for multiple procedures at one time. Should have review via telemedicine. Last seen by Dr. Carrier Alvan in April 2025.   I also confirmed the patient resides in the state of [place] . As per [facility] Medical Board telemedicine laws, the patient must reside in the state in which the provider is licensed.   Lamarr Satterfield, NP 11/20/2023, 12:06 PM [facility]

## 2023-11-20 NOTE — Telephone Encounter (Signed)
 Tried contacting patient to schedule televisit no answer wasn't able to leave vm will try again at a later time

## 2023-11-24 ENCOUNTER — Ambulatory Visit (INDEPENDENT_AMBULATORY_CARE_PROVIDER_SITE_OTHER): Admitting: Internal Medicine

## 2023-11-24 ENCOUNTER — Encounter: Payer: Self-pay | Admitting: Internal Medicine

## 2023-11-24 ENCOUNTER — Telehealth: Payer: Self-pay

## 2023-11-24 VITALS — BP 119/64 | HR 112 | Temp 97.5°F | Ht 65.5 in | Wt 157.8 lb

## 2023-11-24 DIAGNOSIS — K219 Gastro-esophageal reflux disease without esophagitis: Secondary | ICD-10-CM | POA: Diagnosis not present

## 2023-11-24 DIAGNOSIS — K626 Ulcer of anus and rectum: Secondary | ICD-10-CM

## 2023-11-24 DIAGNOSIS — R1319 Other dysphagia: Secondary | ICD-10-CM

## 2023-11-24 NOTE — Telephone Encounter (Signed)
 Pt is scheduled for a tele-visit on 12/02/23 with Damien Braver, NP. Med rec and consent are done.

## 2023-11-24 NOTE — Telephone Encounter (Signed)
 Pt is scheduled for a tele-visit on 12/02/23 with Damien Braver, NP. Med rec and consent are done.     Patient Consent for Virtual Visit        Calvin Moreno has provided verbal consent on 11/24/2023 for a virtual visit (video or telephone).   CONSENT FOR VIRTUAL VISIT FOR:  Calvin Moreno  By participating in this virtual visit I agree to the following:  I hereby voluntarily request, consent and authorize [facility] and its employed or contracted physicians, physician assistants, nurse practitioners or other licensed health care professionals (the Practitioner), to provide me with telemedicine health care services (the "Services) as deemed necessary by the treating Practitioner. I acknowledge and consent to receive the Services by the Practitioner via telemedicine. I understand that the telemedicine visit will involve communicating with the Practitioner through live audiovisual communication technology and the disclosure of certain medical information by electronic transmission. I acknowledge that I have been given the opportunity to request an in-person assessment or other available alternative prior to the telemedicine visit and am voluntarily participating in the telemedicine visit.  I understand that I have the right to withhold or withdraw my consent to the use of telemedicine in the course of my care at any time, without affecting my right to future care or treatment, and that the Practitioner or I may terminate the telemedicine visit at any time. I understand that I have the right to inspect all information obtained and/or recorded in the course of the telemedicine visit and may receive copies of available information for a reasonable fee.  I understand that some of the potential risks of receiving the Services via telemedicine include:  Delay or interruption in medical evaluation due to technological equipment failure or disruption; Information transmitted may not be  sufficient (e.g. poor resolution of images) to allow for appropriate medical decision making by the Practitioner; and/or  In rare instances, security protocols could fail, causing a breach of personal health information.  Furthermore, I acknowledge that it is my responsibility to provide information about my medical history, conditions and care that is complete and accurate to the best of my ability. I acknowledge that Practitioner's advice, recommendations, and/or decision may be based on factors not within their control, such as incomplete or inaccurate data provided by me or distortions of diagnostic images or specimens that may result from electronic transmissions. I understand that the practice of medicine is not an exact science and that Practitioner makes no warranties or guarantees regarding treatment outcomes. I acknowledge that a copy of this consent can be made available to me via my patient portal [facility] MyChart), or I can request a printed copy by calling the office of [facility].    I understand that my insurance will be billed for this visit.   I have read or had this consent read to me. I understand the contents of this consent, which adequately explains the benefits and risks of the Services being provided via telemedicine.  I have been provided ample opportunity to ask questions regarding this consent and the Services and have had my questions answered to my satisfaction. I give my informed consent for the services to be provided through the use of telemedicine in my medical care

## 2023-11-24 NOTE — Progress Notes (Signed)
 Primary Care Physician:  Shona Norleen PEDLAR, MD Primary Gastroenterologist:  Dr. Shaaron  Pre-Procedure History & Physical: HPI:  Calvin Moreno is a 83 y.o. male here for follow-up of abnormal rectum on CT rectal bleeding last fall.  Unfortunately, after I dilated his esophagus last fall he subsequently sustained a L4 fracture.  Lots of pain and constipation on opioid therapy.  Severe obstipation.  Rectal bleeding unable to have a bowel movement CT showed thickening of the rectal wall.  Flexible sigmoidoscopy by Dr. Cinderella revealed a good sized area of ulceration biopsies revealed only granulation tissue.  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  He was treated with APC he eventually came off opioids.  Continues on MiraLAX  Metamucil and stool softener.  Has a bowel movement fairly regularly 1 1 every 1 to 2 days but still has to strain to have a bowel movement.  He had a full colonoscopy 2022. found to have diverticulosis and Neovascular changes in the rectum treated with APC clinically, did well until last follow-up.  He is no longer on opioids.  He spent 6 weeks in rehab.  Unfortunately, he also was found to have a new lung cancer for which he has been seeing Dr. Rogers he received radiation treatment in [place] .  Past Medical History:  Diagnosis Date   Aortic stenosis, mod 2021   And insufficiency, evaluation by Dr. Edith in 2000   Arthritis    Benign prostatic hypertrophy    PSA of 1.39 in 09/2010   Chronic kidney disease    Borderline; creatinine of 1.6 in 08/2009, but 1.15 in 05/2010   Chronic sinusitis    By MRI   Diverticulosis 2013   GERD (gastroesophageal reflux disease)    Hearing impaired    Hemorrhoids 2006   Hiatal hernia 2006   History of kidney stones    HOH (hard of hearing)    Hyperlipidemia    Elevated triglycerides   Hypertension    Mild internal carotid artery plaque on MRI/MRA in 2002   Hypothyroidism    IBS (irritable bowel  syndrome)    Insomnia    Left bundle branch block 2012   2012   Metabolic syndrome    Fasting hyperglycemia   Murmur    Nephrolithiasis 08/2009   2011   Obesity    Proctitis    radiation induced   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   s/p XRT   Seasonal allergies    Stroke (HCC)    TIA, no residual, seen on MRI   Tobacco abuse, in remission    60 pack years; quit in 1999   Uses hearing aid    bilateral    Past Surgical History:  Procedure Laterality Date   ANKLE FRACTURE SURGERY     left   BIOPSY  12/31/2022   Procedure: BIOPSY;  Surgeon: Shaaron Lamar HERO, MD;  [facility];  Service: Endoscopy;;   BRONCHIAL BIOPSY  05/14/2023   Procedure: BRONCHIAL BIOPSIES;  Surgeon: Malka Domino, MD;  Location: [facility];  Service: Pulmonary;;   BRONCHIAL NEEDLE ASPIRATION BIOPSY  05/14/2023   Procedure: BRONCHIAL NEEDLE ASPIRATION BIOPSIES;  Surgeon: Malka Domino, MD;  Location: [facility];  Service: Pulmonary;;   CATARACT EXTRACTION Left    CATARACT EXTRACTION W/PHACO Right 12/19/2013   Procedure: CATARACT EXTRACTION PHACO AND INTRAOCULAR LENS PLACEMENT RIGHT EYE CDE=14.78;  Surgeon: Cherene Mania, MD;  Location: [facility];  Service: Ophthalmology;  Laterality: Right;  CHOLECYSTECTOMY     COLONOSCOPY  2006   Dr. Retia mucosa throughout colon, moderate internal hemorrhoids. bx= benign colonic mucosa   COLONOSCOPY  12/03/2011   Dr. Shaaron- colonic diverticulosis, radiation-induced proctitis- s/p APC ablation, no microscopic colitis on bx   COLONOSCOPY WITH PROPOFOL  N/A 06/12/2020   Procedure: COLONOSCOPY WITH PROPOFOL ;  Surgeon: Shaaron Lamar HERO, MD;  [facility];  Service: Endoscopy;  Laterality: N/A;  2:45pm   ESOPHAGOGASTRODUODENOSCOPY  2006   Dr. Celestia- small hiatal hernia and slightly reddened distal esophagus o/w normal   ESOPHAGOGASTRODUODENOSCOPY (EGD) WITH PROPOFOL  N/A 12/31/2022   Procedure: ESOPHAGOGASTRODUODENOSCOPY (EGD) WITH PROPOFOL ;   Surgeon: Shaaron Lamar HERO, MD;  [facility];  Service: Endoscopy;  Laterality: N/A;  1245pm, asa 3   FLEXIBLE SIGMOIDOSCOPY N/A 07/13/2013   MFM:Wzncjdrlojm changes of the rectum with active oozing consistent with chronic radiation proctitis.  Otherwise, negative sigmoidoscopy to 40 cm.Status post argon plasma coagulation ablation    FLEXIBLE SIGMOIDOSCOPY N/A 04/02/2023   Procedure: FLEXIBLE SIGMOIDOSCOPY;  Surgeon: Cinderella Deatrice FALCON, MD;  [facility];  Service: Endoscopy;  Laterality: N/A;   HOT HEMOSTASIS N/A 07/13/2013   Procedure: HOT HEMOSTASIS (ARGON PLASMA COAGULATION/BICAP);  Surgeon: Lamar HERO Shaaron, MD;  [facility];  Service: Endoscopy;  Laterality: N/A;   HOT HEMOSTASIS  04/02/2023   Procedure: HOT HEMOSTASIS (ARGON PLASMA COAGULATION/BICAP);  Surgeon: Cinderella Deatrice FALCON, MD;  [facility];  Service: Endoscopy;;   Ileocolonoscopy  12/03/2011   MFM:Rnonwpr diverticulosis. Radiation-induced proctitis status post APC ablation. Status post segmental colon Biopsy   MALONEY DILATION N/A 12/31/2022   Procedure: MALONEY DILATION;  Surgeon: Shaaron Lamar HERO, MD;  [facility];  Service: Endoscopy;  Laterality: N/A;   TONSILLECTOMY      Prior to Admission medications   Medication Sig Start Date End Date Taking? Authorizing Provider  acetaminophen  (TYLENOL ) 500 MG tablet Take 2 tablets (1,000 mg total) by mouth every 8 (eight) hours as needed for mild pain (pain score 1-3). 04/02/23 04/01/24 Yes Ricky Fines, MD  acyclovir (ZOVIRAX) 800 MG tablet Take 800 mg by mouth every 8 (eight) hours as needed. And give one tablet by mouth once daily.   Yes [provider]  ALPRAZolam  (XANAX ) 0.5 MG tablet Take 1 tablet (0.5 mg total) by mouth at bedtime. 05/04/23  Yes Abdul Fine, MD  alum & mag hydroxide-simeth (MAALOX PLUS) 400-400-40 MG/5ML suspension Take 5 mLs by mouth every 4 (four) hours as needed for indigestion.   Yes [provider]  Ascorbic Acid (VITAMIN C) 1000 MG tablet Take 1,000 mg by mouth daily.   Yes [provider]  docusate sodium  (COLACE) 100 MG capsule Take 1 capsule (100 mg total) by mouth 2 (two) times daily. 04/02/23  Yes Ricky Fines, MD  finasteride  (PROSCAR ) 5 MG tablet Take 1 tablet (5 mg total) by mouth daily. 09/23/23  Yes McKenzie, Belvie CROME, MD  fluticasone (CUTIVATE) 0.05 % cream SMARTSIG:Topical 1-2 Times Daily PRN 12/10/22  Yes [provider]  gabapentin  (NEURONTIN ) 100 MG capsule Take 1 capsule (100 mg total) by mouth at bedtime. 10/05/23  Yes Rogers Hai, MD  levothyroxine  (SYNTHROID ) 25 MCG tablet Take 25 mcg by mouth daily. 10/24/22  Yes [provider]  Melatonin 3 MG TABS Take 3 mg by mouth at bedtime. For sleep   Yes [provider]  pantoprazole  (PROTONIX ) 40 MG tablet Take 1 tablet (40 mg total) by mouth daily before breakfast. 03/03/23  Yes Mace Weinberg, Lamar HERO, MD  Polyethyl Glycol-Propyl Glycol (SYSTANE OP) Place 1 drop into both eyes 2 (two) times daily as needed (dry eyes).   Yes [provider]  polyethylene glycol (MIRALAX  / GLYCOLAX ) 17 g packet Take 17 g by mouth daily. Can be spread to every other day in the setting of diarrhea. 04/02/23  Yes Ricky Fines, MD  psyllium (HYDROCIL/METAMUCIL) 95 % PACK Take 1 packet by mouth 2 (two) times daily. 04/02/23  Yes Ricky Fines, MD  silodosin  (RAPAFLO ) 8 MG CAPS capsule Take 1 capsule (8 mg total) by mouth 2 (two) times daily. 09/23/23  Yes McKenzie, Belvie CROME, MD  simvastatin  (ZOCOR ) 20 MG tablet Take 20 mg by mouth every evening.   Yes [provider]  traMADol  (ULTRAM ) 50 MG tablet Take 25-50 mg by mouth 2 (two) times daily as needed. 05/28/23  Yes [provider]  vitamin B-12 (CYANOCOBALAMIN ) 1000 MCG tablet Take 2,000 mcg by mouth daily.   Yes [provider]    Allergies as of 11/24/2023 - Review Complete 11/24/2023  Allergen Reaction Noted    Norvasc  [amlodipine  besylate] Swelling 09/19/2014   Codeine Nausea And Vomiting 11/06/2011   Amoxicillin Rash 12/16/2021    Family History  Problem Relation Age of Onset   Heart failure Mother    Heart attack Father    Heart failure Brother        bladder cancer (agent orange exposure)   Cirrhosis Brother        chronic viral hepatitis   Colon cancer Neg Hx     Social History   Socioeconomic History   Marital status: Married    Spouse name: Not on file   Number of children: 2   Years of education: Not on file   Highest education level: Not on file  Occupational History   Occupation: Retired    Associate Professor: [employer]  Tobacco Use   Smoking status: Former    Current packs/day: 0.00    Average packs/day: 1.5 packs/day for 40.0 years (60.0 ttl pk-yrs)    Types: Cigarettes    Start date: 11/04/1957    Quit date: 11/04/1997    Years since quitting: 26.0    Passive exposure: Never   Smokeless tobacco: Never   Tobacco comments:    Quit in 1999  Vaping Use   Vaping status: Never Used  Substance and Sexual Activity   Alcohol use: No    Alcohol/week: 0.0 standard drinks of alcohol   Drug use: No   Sexual activity: Yes  Other Topics Concern   Not on file  Social History Narrative   Married with 2 children   Lives locally   No regular exercise   Social Drivers of Health   Financial Resource Strain: Not on file  Food Insecurity: No Food Insecurity (08/14/2023)   Received from [facility]   Hunger Vital Sign    Within the past 12 months, you worried that your food would run out before you got the money to buy more.: Never true    Within the past 12 months, the food you bought just didn't last and you didn't have money to get more.: Never true  Transportation Needs: No Transportation Needs (08/14/2023)   Received from [facility]   PRAPARE - Transportation    Lack of Transportation (Medical): No    Lack of Transportation (Non-Medical): No  Physical Activity:  Not on file  Stress: Not on file  Social Connections: Not on  file  Intimate Partner Violence: Not At Risk (03/30/2023)   Humiliation, Afraid, Rape, and Kick questionnaire    Fear of Current or Ex-Partner: No    Emotionally Abused: No    Physically Abused: No    Sexually Abused: No    Review of Systems: See HPI, otherwise negative ROS  Physical Exam: BP 119/64 (BP Location: Right Arm, Patient Position: Sitting, Cuff Size: Normal)   Pulse (!) 112   Temp (!) 97.5 F (36.4 C) (Oral)   Ht 5' 5.5 (1.664 m)   Wt 157 lb 12.8 oz (71.6 kg)   SpO2 98%   BMI 25.86 kg/m  General:   Alert,  Well-developed, well-nourished, pleasant and cooperative in NAD Neck:  Supple; no masses or thyromegaly. No significant cervical adenopathy. Lungs:  Clear throughout to auscultation.   No wheezes, crackles, or rhonchi. No acute distress. Heart:  Regular rate and rhythm; no murmurs, clicks, rubs,  or gallops. Abdomen: Non-distended, normal bowel sounds.  Soft and nontender without appreciable mass or hepatosplenomegaly.   Impression/Plan: Pleasant 83 year old male [employer]  veteran who unfortunately suffered L4 spine fracture last year.  Developed severe obstipation with this injury in the setting of opioid therapy.  Abnormal rectum on CT and bleeding likely due to stercoral ulcer.  Ulcer found consistent with this entity biopsies negative.  Since being off opioid therapy and on a bowel regimen he send not had any more bleeding.  No abdominal pain he still has to strain which is suboptimal. Rectal findings essentially textbook for stercoral ulceration.   He also has history of GERD.  Dysphagia resolved status post dilation of his ring.  Reflux well-controlled on once daily pantoprazole .  It is good to know he had a full colonoscopy in 2022.  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  Recommendations:  Continue stool softener twice daily  Continue Metamucil daily  Hold  off on MiraLAX  for now(unless needed in an emergency)  Begin Linzess 145 gelcap 1 capsule daily.  Samples provided.  Let me hear  in 2 weeks  how you are doing.  We may need to fine-tune this regimen.  My goal is for normalization of bowel function as possible without any episodes of diarrhea or continence.  Would like to AVOID avoid straining.  For reflux, continue pantoprazole  40 mg daily  At this time, no need for a look back sigmoidoscopy.  Office visit with me in 6 weeks.  Notice: This dictation was prepared with Dragon dictation along with smaller phrase technology. Any transcriptional errors that result from this process are unintentional and may not be corrected upon review.

## 2023-11-24 NOTE — Patient Instructions (Signed)
 It was good to see you again today!  The ulcer in your rectum was a pressure sore from being severely constipated-in part due to your spine fracture and the use of narcotics.  Biopsies did not show any evidence of cancer.  My recommended bowel regimen for you is as follows:  Continue stool softener twice daily  Continue Metamucil daily  Hold off on MiraLAX  for now(unless needed in an emergency)  Begin Linzess 145 gelcap 1 capsule daily.  Samples provided.  Let me hear from you in 2 weeks and let me know how you are doing.  We may need to fine-tune this regimen.  My goal for you is to get your bowel function as normal as possible without any episodes of diarrhea or accidents.  We want to avoid straining.  For reflux, continue pantoprazole  40 mg daily  At this time, no need to go back and look at your rectum again.  Office visit with me in 6 weeks.

## 2023-12-02 ENCOUNTER — Ambulatory Visit

## 2023-12-02 NOTE — Telephone Encounter (Signed)
   Patient Name: Calvin Moreno  DOB: 1940-07-14 MRN: [id number]  Primary Cardiologist: Alvan Carrier, MD  Chart reviewed as part of pre-operative protocol coverage.   Simple dental extractions (i.e. 1-2 teeth), cleanings, fillings, and crowns are considered low risk procedures per guidelines and generally do not require any specific cardiac clearance. It is also generally accepted that for simple extractions and dental cleanings, there is no need to interrupt blood thinner therapy.  SBE prophylaxis is not required for the patient from a cardiac standpoint.  I will route this recommendation to the requesting party via Epic fax function and remove from pre-op pool.  Please call with questions.  Damien JAYSON Braver, NP 12/02/2023, 1:32 PM

## 2023-12-08 ENCOUNTER — Telehealth (INDEPENDENT_AMBULATORY_CARE_PROVIDER_SITE_OTHER): Payer: Self-pay | Admitting: *Deleted

## 2023-12-08 NOTE — Telephone Encounter (Signed)
 10:32 called Lyle and gave her instructions Mr. Majestic Brister can take 1 81 mg ASA daily per Dr. Ivonne instructions.  She read back and documented in his chart there.

## 2023-12-08 NOTE — Telephone Encounter (Signed)
 Brooke called back 2:35  He cannot hear on homeline   His cell number they said to call [phone] does not have a voice mail set up --unable to leave a message.    Reason for call  11/24/2023 office visit Begin Linzess 145 gelcap 1 capsule daily.  Samples provided.   Let me hear  in 2 weeks  how you are doing.  We may need to fine-tune this regimen.

## 2023-12-08 NOTE — Telephone Encounter (Signed)
 12/07/2023 voicemail 11:07 on [name]'s phone   Brooke from [facility] called and left a message  Calvin Moreno wants to know when he can start safe to start back his Aspirin  given his condition? He was stopped 02/2023 due to GI Bleed then.    [phone] option 4 --I called Brooke to double check med and when

## 2023-12-08 NOTE — Telephone Encounter (Signed)
 Voice mail today at 10:38  Needs to do a follow call on how he is doing on Linzess for Dr. Shaaron.    Return call at [phone]--number answers but then nothing--may have to call Brooke at [phone] Option 4 to get her help.  I called Brooke --lmoam for a call back --rr

## 2023-12-08 NOTE — Telephone Encounter (Signed)
 Noted

## 2023-12-08 NOTE — Telephone Encounter (Signed)
 noted

## 2023-12-09 NOTE — Telephone Encounter (Signed)
 Phoned the pt's cell and was advised by him that the Linzess helps but he is still having to strain to have a BM. He doesn't know if you want to go higher in the dose or add something else with it. Please advise

## 2023-12-09 NOTE — Telephone Encounter (Signed)
 Phoned and advised the pt of the Linzess 290 mcg samples that will be at the front desk for him. Pt advised of instructions and to return call in 2 weeks to let us  know how he is doing. Pt expressed understanding

## 2023-12-28 ENCOUNTER — Other Ambulatory Visit: Payer: Self-pay

## 2023-12-28 ENCOUNTER — Telehealth: Payer: Self-pay

## 2023-12-28 MED ORDER — TRULANCE 3 MG PO TABS: 3.0000 mg | ORAL_TABLET | Freq: Every day | ORAL | 11 refills | Status: DC

## 2023-12-28 NOTE — Telephone Encounter (Signed)
 Pt was only given samples of both 145 and 290. Insurance will not cover prescription. Do you want to try pt on Trulance , looks like it may be covered.

## 2023-12-28 NOTE — Telephone Encounter (Signed)
 Pt called with an update stating that the linzess 290 is not working.

## 2023-12-28 NOTE — Progress Notes (Signed)
 Pt was made aware and verbalized understanding.

## 2023-12-29 NOTE — Telephone Encounter (Signed)
 Pt was made aware and verbalized understanding.

## 2024-01-05 ENCOUNTER — Ambulatory Visit (INDEPENDENT_AMBULATORY_CARE_PROVIDER_SITE_OTHER): Admitting: Internal Medicine

## 2024-01-05 ENCOUNTER — Encounter: Payer: Self-pay | Admitting: Internal Medicine

## 2024-01-05 VITALS — BP 131/78 | HR 105 | Temp 97.9°F | Ht 65.0 in | Wt 159.4 lb

## 2024-01-05 DIAGNOSIS — K5909 Other constipation: Secondary | ICD-10-CM

## 2024-01-05 DIAGNOSIS — K219 Gastro-esophageal reflux disease without esophagitis: Secondary | ICD-10-CM | POA: Diagnosis not present

## 2024-01-05 NOTE — Patient Instructions (Signed)
 Good to see you again today.  Continue taking MiraLAX  daily indefinitely.  Continue stool softener daily  Also, continue taking your Metamucil daily  Addition, continue taking your pantoprazole  or Protonix  40 mg daily  No further evaluation is needed at this time  Plan to see you back in 6 months and as needed

## 2024-01-05 NOTE — Progress Notes (Signed)
 Primary Care Physician:  Shona Norleen PEDLAR, MD Primary Gastroenterologist:  Dr. Shaaron  Pre-Procedure History & Physical: HPI:  Calvin Moreno is a 83 y.o. male here for follow-up of constipation.  History of rectal bleeding secondary to stercoral ulcer that goes back to severe constipation related to spine issues.  He has been on varying agents including escalating doses of Linzess more recently Trulance .  He is now titrating MiraLAX  using daily Metamucil and stool softener to achieve 1-3 bowel movements daily.  He is very happy with this improvement.  He has a history of GERD well-controlled on pantoprazole  40 mg daily.  He tells me he has got a nice response to radiation therapy regarding his lung cancer.  Past Medical History:  Diagnosis Date   Aortic stenosis, mod 2021   And insufficiency, evaluation by Dr. Edith in 2000   Arthritis    Benign prostatic hypertrophy    PSA of 1.39 in 09/2010   Chronic kidney disease    Borderline; creatinine of 1.6 in 08/2009, but 1.15 in 05/2010   Chronic sinusitis    By MRI   Diverticulosis 2013   GERD (gastroesophageal reflux disease)    Hearing impaired    Hemorrhoids 2006   Hiatal hernia 2006   History of kidney stones    HOH (hard of hearing)    Hyperlipidemia    Elevated triglycerides   Hypertension    Mild internal carotid artery plaque on MRI/MRA in 2002   Hypothyroidism    IBS (irritable bowel syndrome)    Insomnia    Left bundle branch block 2012   2012   Metabolic syndrome    Fasting hyperglycemia   Murmur    Nephrolithiasis 08/2009   2011   Obesity    Proctitis    radiation induced   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   s/p XRT   Seasonal allergies    Stroke (HCC)    TIA, no residual, seen on MRI   Tobacco abuse, in remission    60 pack years; quit in 1999   Uses hearing aid    bilateral    Past Surgical History:  Procedure Laterality Date   ANKLE FRACTURE SURGERY     left   BIOPSY  12/31/2022   Procedure: BIOPSY;   Surgeon: Shaaron Lamar HERO, MD;  [facility]: [facility];  Service: Endoscopy;;   BRONCHIAL BIOPSY  05/14/2023   Procedure: BRONCHIAL BIOPSIES;  Surgeon: Malka Domino, MD;  Location: [facility];  Service: Pulmonary;;   BRONCHIAL NEEDLE ASPIRATION BIOPSY  05/14/2023   Procedure: BRONCHIAL NEEDLE ASPIRATION BIOPSIES;  Surgeon: Malka Domino, MD;  Location: [facility];  Service: Pulmonary;;   CATARACT EXTRACTION Left    CATARACT EXTRACTION W/PHACO Right 12/19/2013   Procedure: CATARACT EXTRACTION PHACO AND INTRAOCULAR LENS PLACEMENT RIGHT EYE CDE=14.78;  Surgeon: Cherene Mania, MD;  Location: [facility];  Service: Ophthalmology;  Laterality: Right;   CHOLECYSTECTOMY     COLONOSCOPY  2006   Dr. Retia mucosa throughout colon, moderate internal hemorrhoids. bx= benign colonic mucosa   COLONOSCOPY  12/03/2011   Dr. Shaaron- colonic diverticulosis, radiation-induced proctitis- s/p APC ablation, no microscopic colitis on bx   COLONOSCOPY WITH PROPOFOL  N/A 06/12/2020   Procedure: COLONOSCOPY WITH PROPOFOL ;  Surgeon: Shaaron Lamar HERO, MD;  [facility]: [facility];  Service: Endoscopy;  Laterality: N/A;  2:45pm   ESOPHAGOGASTRODUODENOSCOPY  2006   Dr. Celestia- small hiatal hernia and slightly reddened distal esophagus o/w normal   ESOPHAGOGASTRODUODENOSCOPY (EGD) WITH PROPOFOL   N/A 12/31/2022   Procedure: ESOPHAGOGASTRODUODENOSCOPY (EGD) WITH PROPOFOL ;  Surgeon: Shaaron Lamar HERO, MD;  [facility];  Service: Endoscopy;  Laterality: N/A;  1245pm, asa 3   FLEXIBLE SIGMOIDOSCOPY N/A 07/13/2013   MFM:Wzncjdrlojm changes of the rectum with active oozing consistent with chronic radiation proctitis.  Otherwise, negative sigmoidoscopy to 40 cm.Status post argon plasma coagulation ablation    FLEXIBLE SIGMOIDOSCOPY N/A 04/02/2023   Procedure: FLEXIBLE SIGMOIDOSCOPY;  Surgeon: Cinderella Deatrice FALCON, MD;  [facility];  Service: Endoscopy;  Laterality: N/A;   HOT HEMOSTASIS N/A 07/13/2013    Procedure: HOT HEMOSTASIS (ARGON PLASMA COAGULATION/BICAP);  Surgeon: Lamar HERO Shaaron, MD;  [facility];  Service: Endoscopy;  Laterality: N/A;   HOT HEMOSTASIS  04/02/2023   Procedure: HOT HEMOSTASIS (ARGON PLASMA COAGULATION/BICAP);  Surgeon: Cinderella Deatrice FALCON, MD;  [facility];  Service: Endoscopy;;   Ileocolonoscopy  12/03/2011   MFM:Rnonwpr diverticulosis. Radiation-induced proctitis status post APC ablation. Status post segmental colon Biopsy   MALONEY DILATION N/A 12/31/2022   Procedure: MALONEY DILATION;  Surgeon: Shaaron Lamar HERO, MD;  [facility];  Service: Endoscopy;  Laterality: N/A;   TONSILLECTOMY      Prior to Admission medications   Medication Sig Start Date End Date Taking? Authorizing Provider  acetaminophen  (TYLENOL ) 500 MG tablet Take 2 tablets (1,000 mg total) by mouth every 8 (eight) hours as needed for mild pain (pain score 1-3). 04/02/23 04/01/24 Yes Ricky Fines, MD  acyclovir (ZOVIRAX) 800 MG tablet Take 800 mg by mouth every 8 (eight) hours as needed. And give one tablet by mouth once daily.   Yes [provider]  ALPRAZolam  (XANAX ) 0.5 MG tablet Take 1 tablet (0.5 mg total) by mouth at bedtime. 05/04/23  Yes Abdul Fine, MD  alum & mag hydroxide-simeth (MAALOX PLUS) 400-400-40 MG/5ML suspension Take 5 mLs by mouth every 4 (four) hours as needed for indigestion.   Yes [provider]  Ascorbic Acid (VITAMIN C) 1000 MG tablet Take 1,000 mg by mouth daily.   Yes [provider]  docusate sodium  (COLACE) 100 MG capsule Take 1 capsule (100 mg total) by mouth 2 (two) times daily. 04/02/23  Yes Ricky Fines, MD  finasteride  (PROSCAR ) 5 MG tablet Take 1 tablet (5 mg total) by mouth daily. 09/23/23  Yes McKenzie, Belvie CROME, MD  fluticasone (CUTIVATE) 0.05 % cream SMARTSIG:Topical 1-2 Times Daily PRN 12/10/22  Yes [provider]  gabapentin  (NEURONTIN ) 100 MG capsule Take 1 capsule (100 mg total) by mouth at  bedtime. 10/05/23  Yes Rogers Hai, MD  levothyroxine  (SYNTHROID ) 25 MCG tablet Take 25 mcg by mouth daily. 10/24/22  Yes [provider]  Melatonin 3 MG TABS Take 3 mg by mouth at bedtime. For sleep   Yes [provider]  pantoprazole  (PROTONIX ) 40 MG tablet Take 1 tablet (40 mg total) by mouth daily before breakfast. 03/03/23  Yes Cornelius Schuitema, Lamar HERO, MD  Polyethyl Glycol-Propyl Glycol (SYSTANE OP) Place 1 drop into both eyes 2 (two) times daily as needed (dry eyes).   Yes [provider]  polyethylene glycol (MIRALAX  / GLYCOLAX ) 17 g packet Take 17 g by mouth daily. Can be spread to every other day in the setting of diarrhea. 04/02/23  Yes Ricky Fines, MD  psyllium (HYDROCIL/METAMUCIL) 95 % PACK Take 1 packet by mouth 2 (two) times daily. 04/02/23  Yes Ricky Fines, MD  silodosin  (RAPAFLO ) 8 MG CAPS capsule Take 1 capsule (8 mg total) by  mouth 2 (two) times daily. 09/23/23  Yes McKenzie, Belvie CROME, MD  simvastatin  (ZOCOR ) 20 MG tablet Take 20 mg by mouth every evening.   Yes [provider]  traMADol  (ULTRAM ) 50 MG tablet Take 25-50 mg by mouth 2 (two) times daily as needed. 05/28/23  Yes [provider]  vitamin B-12 (CYANOCOBALAMIN ) 1000 MCG tablet Take 2,000 mcg by mouth daily.   Yes [provider]    Allergies as of 01/05/2024 - Review Complete 01/05/2024  Allergen Reaction Noted   Norvasc  [amlodipine  besylate] Swelling 09/19/2014   Codeine Nausea And Vomiting 11/06/2011   Amoxicillin Rash 12/16/2021    Family History  Problem Relation Age of Onset   Heart failure Mother    Heart attack Father    Heart failure Brother        bladder cancer (agent orange exposure)   Cirrhosis Brother        chronic viral hepatitis   Colon cancer Neg Hx     Social History   Socioeconomic History   Marital status: Married    Spouse name: Not on file   Number of children: 2   Years of education: Not on file   Highest education level:  Not on file  Occupational History   Occupation: Retired    Associate Professor: [employer]  Tobacco Use   Smoking status: Former    Current packs/day: 0.00    Average packs/day: 1.5 packs/day for 40.0 years (60.0 ttl pk-yrs)    Types: Cigarettes    Start date: 11/04/1957    Quit date: 11/04/1997    Years since quitting: 26.1    Passive exposure: Never   Smokeless tobacco: Never   Tobacco comments:    Quit in 1999  Vaping Use   Vaping status: Never Used  Substance and Sexual Activity   Alcohol use: No    Alcohol/week: 0.0 standard drinks of alcohol   Drug use: No   Sexual activity: Yes  Other Topics Concern   Not on file  Social History Narrative   Married with 2 children   Lives locally   No regular exercise   Social Drivers of Health   Financial Resource Strain: Not on file  Food Insecurity: No Food Insecurity (08/14/2023)   Received from [facility]   Hunger Vital Sign    Within the past 12 months, you worried that your food would run out before you got the money to buy more.: Never true    Within the past 12 months, the food you bought just didn't last and you didn't have money to get more.: Never true  Transportation Needs: No Transportation Needs (08/14/2023)   Received from [facility]   PRAPARE - Transportation    Lack of Transportation (Medical): No    Lack of Transportation (Non-Medical): No  Physical Activity: Not on file  Stress: Not on file  Social Connections: Not on file  Intimate Partner Violence: Not At Risk (03/30/2023)   Humiliation, Afraid, Rape, and Kick questionnaire    Fear of Current or Ex-Partner: No    Emotionally Abused: No    Physically Abused: No    Sexually Abused: No    Review of Systems: See HPI, otherwise negative ROS  Physical Exam: BP 131/78 (BP Location: Right Arm, Patient Position: Sitting, Cuff Size: Normal)   Pulse (!) 105   Temp 97.9 F (36.6 C) (Oral)   Ht 5' 5 (1.651 m)   Wt 159 lb 6.4 oz (72.3 kg)  SpO2 98%    BMI 26.53 kg/m  General:   Alert,  Well-developed, well-nourished, pleasant and cooperative in NAD Abdomen: Non-distended, normal bowel sounds.  Soft and nontender without appreciable mass or hepatosplenomegaly.    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  He is not having any more bleeding.  Is very happy with progress.  GERD well-controlled with Protonix  40 mg once daily.  Recommendations:  Continue taking MiraLAX  daily indefinitely.  Continue stool softener daily  Also, continue taking your Metamucil daily  Addition, continue taking your pantoprazole  or Protonix  40 mg daily  No further evaluation is needed at this time  Plan to see you back in 6 months and as needed    Notice: This dictation was prepared with Dragon dictation along with smaller phrase technology. Any transcriptional errors that result from this process are unintentional and may not be corrected upon review.

## 2024-01-26 ENCOUNTER — Encounter: Payer: Self-pay | Admitting: Internal Medicine

## 2024-02-08 ENCOUNTER — Encounter: Payer: Self-pay | Admitting: Internal Medicine

## 2024-03-08 ENCOUNTER — Ambulatory Visit (INDEPENDENT_AMBULATORY_CARE_PROVIDER_SITE_OTHER): Admitting: Internal Medicine

## 2024-03-08 VITALS — BP 114/72 | HR 103 | Temp 98.4°F | Ht 65.0 in | Wt 161.2 lb

## 2024-03-08 DIAGNOSIS — K5909 Other constipation: Secondary | ICD-10-CM

## 2024-03-08 DIAGNOSIS — K219 Gastro-esophageal reflux disease without esophagitis: Secondary | ICD-10-CM

## 2024-03-08 NOTE — Progress Notes (Signed)
 Gastroenterology Progress Note    Primary Care Physician:  Shona Norleen PEDLAR, MD Primary Gastroenterologist:  Dr. Shaaron  Pre-Procedure History & Physical: HPI:  Calvin Moreno is a 83 y.o. male here for follow-up of GERD and constipation.  He has been on varying regimens to facilitate bowel function.  It seems that he is come across good regimen now which he has been using for months.  He takes capful of MiraLAX , stool softener and laxative every day  - this facilitates good bowel function once daily  He thinks he passed passed a small amount of blood when he strained a couple of months ago.  He has radiation proctitis; he has been treated with APC previously he is otherwise up-to-date on colonoscopy.  This happened 1 time  - it was a small amount he has not been seeing any blood lately he took a fall and hurt his back he has been in rehab.  Past Medical History:  Diagnosis Date   Aortic stenosis, mod 2021   And insufficiency, evaluation by Dr. Edith in 2000   Arthritis    Benign prostatic hypertrophy    PSA of 1.39 in 09/2010   Chronic kidney disease    Borderline; creatinine of 1.6 in 08/2009, but 1.15 in 05/2010   Chronic sinusitis    By MRI   Diverticulosis 2013   GERD (gastroesophageal reflux disease)    Hearing impaired    Hemorrhoids 2006   Hiatal hernia 2006   History of kidney stones    HOH (hard of hearing)    Hyperlipidemia    Elevated triglycerides   Hypertension    Mild internal carotid artery plaque on MRI/MRA in 2002   Hypothyroidism    IBS (irritable bowel syndrome)    Insomnia    Left bundle branch block 2012   2012   Metabolic syndrome    Fasting hyperglycemia   Murmur    Nephrolithiasis 08/2009   2011   Obesity    Proctitis    radiation induced   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   s/p XRT   Seasonal allergies    Stroke (HCC)    TIA, no residual, seen on MRI   Tobacco abuse, in remission    60 pack years; quit in 1999   Uses hearing aid    bilateral     Past Surgical History:  Procedure Laterality Date   ANKLE FRACTURE SURGERY     left   BIOPSY  12/31/2022   Procedure: BIOPSY;  Surgeon: Shaaron Lamar HERO, MD;  Location: [facility];  Service: Endoscopy;;   BRONCHIAL BIOPSY  05/14/2023   Procedure: BRONCHIAL BIOPSIES;  Surgeon: Malka Domino, MD;  Location: [facility];  Service: Pulmonary;;   BRONCHIAL NEEDLE ASPIRATION BIOPSY  05/14/2023   Procedure: BRONCHIAL NEEDLE ASPIRATION BIOPSIES;  Surgeon: Malka Domino, MD;  Location: [facility];  Service: Pulmonary;;   CATARACT EXTRACTION Left    CATARACT EXTRACTION W/PHACO Right 12/19/2013   Procedure: CATARACT EXTRACTION PHACO AND INTRAOCULAR LENS PLACEMENT RIGHT EYE CDE=14.78;  Surgeon: Cherene Mania, MD;  Location: [facility];  Service: Ophthalmology;  Laterality: Right;   CHOLECYSTECTOMY     COLONOSCOPY  2006   Dr. Retia mucosa throughout colon, moderate internal hemorrhoids. bx= benign colonic mucosa   COLONOSCOPY  12/03/2011   Dr. Shaaron- colonic diverticulosis, radiation-induced proctitis- s/p APC ablation, no microscopic colitis on bx   COLONOSCOPY WITH PROPOFOL  N/A 06/12/2020   Procedure: COLONOSCOPY WITH PROPOFOL ;  Surgeon: Shaaron Lamar HERO, MD;  Location: [facility];  Service: Endoscopy;  Laterality: N/A;  2:45pm   ESOPHAGOGASTRODUODENOSCOPY  2006   Dr. Celestia- small hiatal hernia and slightly reddened distal esophagus o/w normal   ESOPHAGOGASTRODUODENOSCOPY (EGD) WITH PROPOFOL  N/A 12/31/2022   Procedure: ESOPHAGOGASTRODUODENOSCOPY (EGD) WITH PROPOFOL ;  Surgeon: Shaaron Lamar HERO, MD;  [facility]: [facility];  Service: Endoscopy;  Laterality: N/A;  1245pm, asa 3   FLEXIBLE SIGMOIDOSCOPY N/A 07/13/2013   MFM:[id number] changes of the rectum with active oozing consistent with chronic radiation proctitis.  Otherwise, negative sigmoidoscopy to 40 cm.Status post argon plasma coagulation ablation    FLEXIBLE SIGMOIDOSCOPY N/A 04/02/2023   Procedure: FLEXIBLE  SIGMOIDOSCOPY;  Surgeon: Cinderella Deatrice FALCON, MD;  [facility]: [facility];  Service: Endoscopy;  Laterality: N/A;   HOT HEMOSTASIS N/A 07/13/2013   Procedure: HOT HEMOSTASIS (ARGON PLASMA COAGULATION/BICAP);  Surgeon: Lamar HERO Shaaron, MD;  [facility]: [facility];  Service: Endoscopy;  Laterality: N/A;   HOT HEMOSTASIS  04/02/2023   Procedure: HOT HEMOSTASIS (ARGON PLASMA COAGULATION/BICAP);  Surgeon: Cinderella Deatrice FALCON, MD;  [facility]: [facility];  Service: Endoscopy;;   Ileocolonoscopy  12/03/2011   MFM:Rnonwpr diverticulosis. Radiation-induced proctitis status post APC ablation. Status post segmental colon Biopsy   MALONEY DILATION N/A 12/31/2022   Procedure: MALONEY DILATION;  Surgeon: Shaaron Lamar HERO, MD;  Location: [facility];  Service: Endoscopy;  Laterality: N/A;   TONSILLECTOMY      Prior to Admission medications   Medication Sig Start Date End Date Taking? Authorizing Provider  acetaminophen  (TYLENOL ) 500 MG tablet Take 2 tablets (1,000 mg total) by mouth every 8 (eight) hours as needed for mild pain (pain score 1-3). 04/02/23 04/01/24 Yes Ricky Fines, MD  acyclovir (ZOVIRAX) 800 MG tablet Take 800 mg by mouth every 8 (eight) hours as needed. And give one tablet by mouth once daily.   Yes [provider]  ALPRAZolam  (XANAX ) 0.5 MG tablet Take 1 tablet (0.5 mg total) by mouth at bedtime. 05/04/23  Yes Abdul Fine, MD  alum & mag hydroxide-simeth (MAALOX PLUS) 400-400-40 MG/5ML suspension Take 5 mLs by mouth every 4 (four) hours as needed for indigestion.   Yes [provider]  Ascorbic Acid (VITAMIN C) 1000 MG tablet Take 1,000 mg by mouth daily.   Yes [provider]  docusate sodium  (COLACE) 100 MG capsule Take 1 capsule (100 mg total) by mouth 2 (two) times daily. 04/02/23  Yes Ricky Fines, MD  finasteride  (PROSCAR ) 5 MG tablet Take 1 tablet (5 mg total) by mouth daily. 09/23/23  Yes McKenzie, Belvie CROME, MD  fluticasone (CUTIVATE) 0.05 % cream  SMARTSIG:Topical 1-2 Times Daily PRN 12/10/22  Yes [provider]  gabapentin  (NEURONTIN ) 100 MG capsule Take 1 capsule (100 mg total) by mouth at bedtime. 10/05/23  Yes Rogers Hai, MD  levothyroxine  (SYNTHROID ) 25 MCG tablet Take 25 mcg by mouth daily. 10/24/22  Yes [provider]  Melatonin 3 MG TABS Take 3 mg by mouth at bedtime. For sleep   Yes [provider]  pantoprazole  (PROTONIX ) 40 MG tablet Take 1 tablet (40 mg total) by mouth daily before breakfast. 03/03/23  Yes Shaquile Lutze, Lamar HERO, MD  Polyethyl Glycol-Propyl Glycol (SYSTANE OP) Place 1 drop into both eyes 2 (two) times daily as needed (dry eyes).   Yes [provider]  polyethylene glycol (MIRALAX  / GLYCOLAX ) 17 g packet Take 17 g by mouth daily. Can be spread to every other day in the setting of diarrhea. 04/02/23  Yes Madera,  Eric, MD  psyllium (HYDROCIL/METAMUCIL) 95 % PACK Take 1 packet by mouth 2 (two) times daily. 04/02/23  Yes Ricky Eric, MD  silodosin  (RAPAFLO ) 8 MG CAPS capsule Take 1 capsule (8 mg total) by mouth 2 (two) times daily. 09/23/23  Yes McKenzie, Belvie CROME, MD  simvastatin  (ZOCOR ) 20 MG tablet Take 20 mg by mouth every evening.   Yes [provider]  traMADol  (ULTRAM ) 50 MG tablet Take 25-50 mg by mouth 2 (two) times daily as needed. 05/28/23  Yes [provider]  vitamin B-12 (CYANOCOBALAMIN ) 1000 MCG tablet Take 2,000 mcg by mouth daily.   Yes [provider]    Allergies as of 03/08/2024 - Review Complete 03/08/2024  Allergen Reaction Noted   Norvasc  [amlodipine  besylate] Swelling 09/19/2014   Codeine Nausea And Vomiting 11/06/2011   Amoxicillin Rash 12/16/2021    Family History  Problem Relation Age of Onset   Heart failure Mother    Heart attack Father    Heart failure Brother        bladder cancer (agent orange exposure)   Cirrhosis Brother        chronic viral hepatitis   Colon cancer Neg Hx     Social History    Socioeconomic History   Marital status: Married    Spouse name: Not on file   Number of children: 2   Years of education: Not on file   Highest education level: Not on file  Occupational History   Occupation: Retired    Associate Professor: [employer]  Tobacco Use   Smoking status: Former    Current packs/day: 0.00    Average packs/day: 1.5 packs/day for 40.0 years (60.0 ttl pk-yrs)    Types: Cigarettes    Start date: 11/04/1957    Quit date: 11/04/1997    Years since quitting: 26.3    Passive exposure: Never   Smokeless tobacco: Never   Tobacco comments:    Quit in 1999  Vaping Use   Vaping status: Never Used  Substance and Sexual Activity   Alcohol use: No    Alcohol/week: 0.0 standard drinks of alcohol   Drug use: No   Sexual activity: Yes  Other Topics Concern   Not on file  Social History Narrative   Married with 2 children   Lives locally   No regular exercise   Social Drivers of Health   Financial Resource Strain: Not on file  Food Insecurity: No Food Insecurity (08/14/2023)   Received from [facility]   Hunger Vital Sign    Within the past 12 months, you worried that your food would run out before you got the money to buy more.: Never true    Within the past 12 months, the food you bought just didn't last and you didn't have money to get more.: Never true  Transportation Needs: No Transportation Needs (08/14/2023)   Received [facility]   PRAPARE - Transportation    Lack of Transportation (Medical): No    Lack of Transportation (Non-Medical): No  Physical Activity: Not on file  Stress: Not on file  Social Connections: Not on file  Intimate Partner Violence: Not At Risk (03/30/2023)   Humiliation, Afraid, Rape, and Kick questionnaire    Fear of Current or Ex-Partner: No    Emotionally Abused: No    Physically Abused: No    Sexually Abused: No    Review of Systems   See HPI, otherwise negative ROS  Physical Exam: BP 114/72  Pulse (!) 103    Temp 98.4 F (36.9 C)   Ht 5' 5 (1.651 m)   Wt 161 lb 3.2 oz (73.1 kg)   BMI 26.83 kg/m  General:   Alert,  Well-developed, well-nourished, pleasant and cooperative in NAD Neck:  Supple; no masses or thyromegaly. No significant cervical adenopathy. Lungs:  Clear throughout to auscultation.   No wheezes, crackles, or rhonchi. No acute distress. Heart:  Regular rate and rhythm; no murmurs, clicks, rubs,  or gallops. Abdomen: Non-distended, normal bowel sounds.  Soft and nontender without appreciable mass or hepatosplenomegaly.    Impression/Plan:   Pleasant 82 year old gentleman with a history of GERD well-controlled on pantoprazole  once daily  Longstanding constipation multifactorial in origin doing well now on MiraLAX , fiber and stool softener every day.  Recommendations  continue taking stool softener, MiraLAX  and fiber every day without fail.  Hopefully, this regimen will continue to keep bowels regulated.  If you have any new symptoms such as rectal bleeding let me know.  Continue taking pantoprazole  40 mg every day for reflux  Unless something comes up we will plan to see you back in the office in 1 year.   Notice: This dictation was prepared with Dragon dictation along with smaller phrase technology. Any transcriptional errors that result from this process are unintentional and may not be corrected upon review.

## 2024-03-08 NOTE — Patient Instructions (Signed)
 It was nice to see you again today.  You should continue taking your stool softener, MiraLAX  and fiber every day without fail.  Hopefully, this regiment will keep you nicely regulated.  If you have any new symptoms such as rectal bleeding let me know.  Continue taking pantoprazole  40 mg every day for reflux  Unless something comes up we will plan to see you back in the office in 1 year.

## 2024-03-28 ENCOUNTER — Encounter: Payer: Self-pay | Admitting: Cardiology

## 2024-03-28 ENCOUNTER — Ambulatory Visit: Attending: Cardiology | Admitting: Cardiology

## 2024-03-28 VITALS — BP 130/75 | HR 95 | Ht 65.0 in | Wt 165.0 lb

## 2024-03-28 DIAGNOSIS — E782 Mixed hyperlipidemia: Secondary | ICD-10-CM | POA: Diagnosis present

## 2024-03-28 DIAGNOSIS — I350 Nonrheumatic aortic (valve) stenosis: Secondary | ICD-10-CM | POA: Diagnosis not present

## 2024-03-28 NOTE — Patient Instructions (Signed)
 Medication Instructions:  Your physician recommends that you continue on your current medications as directed. Please refer to the Current Medication list given to you today.  *If you need a refill on your cardiac medications before your next appointment, please call your pharmacy*  Lab Work: None If you have labs (blood work) drawn today and your tests are completely normal, you will receive your results only by: MyChart Message (if you have MyChart) OR A paper copy in the mail If you have any lab test that is abnormal or we need to change your treatment, we will call you to review the results.  Testing/Procedures: Your physician has requested that you have an echocardiogram. Echocardiography is a painless test that uses sound waves to create images of your heart. It provides your doctor with information about the size and shape of your heart and how well your heart's chambers and valves are working. This procedure takes approximately one hour. There are no restrictions for this procedure. Please do NOT wear cologne, perfume, aftershave, or lotions (deodorant is allowed). Please arrive 15 minutes prior to your appointment time.  Please note: We ask at that you not bring children with you during ultrasound (echo/ vascular) testing. Due to room size and safety concerns, children are not allowed in the ultrasound rooms during exams. Our front office staff cannot provide observation of children in our lobby area while testing is being conducted. An adult accompanying a patient to their appointment will only be allowed in the ultrasound room at the discretion of the ultrasound technician under special circumstances. We apologize for any inconvenience.   Follow-Up: At [facility], you and your health needs are our priority.  As part of our continuing mission to provide you with exceptional heart care, our providers are all part of one team.  This team includes your primary Cardiologist  (physician) and Advanced Practice Providers or APPs (Physician Assistants and Nurse Practitioners) who all work together to provide you with the care you need, when you need it.  Your next appointment:   6 month(s)  Provider:   You may see Dina Rich, MD or one of the following Advanced Practice Providers on your designated Care Team:   Randall An, PA-C  Scotesia Zephyr, [place] Jacolyn Reedy, [place]     We recommend signing up for the patient portal called "MyChart".  Sign up information is provided on this After Visit Summary.  MyChart is used to connect with patients for Virtual Visits (Telemedicine).  Patients are able to view lab/test results, encounter notes, upcoming appointments, etc.  Non-urgent messages can be sent to your provider as well.   To learn more about what you can do with MyChart, go to [email or web address].   Other Instructions

## 2024-03-28 NOTE — Progress Notes (Signed)
 Clinical Summary Calvin Moreno is a 83 y.o.male seen today for follow up of lthe following medical problems.    1. Aortic stenosis - 06/2017 echo LVEF 60-65%, grade II diastolic dysfunction, moderate to severe AS per report (mean grad 22, AVA VTI 1, dimensionless index 0.35). Criteria would suggest more moderate AS  - 07/2018 echo :LVEF >65%, mean grad 25, AVA VTI 0.65, dimensionless index 0.33, SVI    12/2021 echo: LVEF 60-65%, severe AS mean grad 35, AVA VTI 0.82, DI 0.24, SVI 40  02/2023 echo: LVEF 60-65%, no WMAs, grade I dd, severe AS mean grad 39 AVA VTI 0.93, DI 0.30    - denies any exertional symptoms thought limited by recent verterbal fracture wearing a brace, chronic neuropathy.  - highest level of activity is walking with walker up to about 10 minutes at his rehab facility, denies any symptoms   08/2023 echo: LVEF 55-60%, mod MS mean grad 9.5, Paradoxical low flow low gradient moderate to severe aortic stenosis.  Mean gradient 24 mmHg, AVI VTI 0.87, DI 0.28, SVI 29. Angle of acquisition  may also lead to underestimation of AV gradients   - isolated episode of SOB dragging trash can out to curb - limited by prior verterbal fracture and chronic pain, chronic neuropathy.      2. 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 He reports was told most likely will not require additioanl treatments, has repeat CT scan.        3. Mitral stenosis - mild by 12/2022 echo mean grad 4 08/2023 echo: LVEF 55-60%, mod MS mean grad 9.5   4. HTN -compliant with meds   3. Hyperlipidemia - reports side effects on other statins in the past. Has done well on simva 20.  Labs followed by pcp   Jan 2023 TC 118 HDL 33 LDL 55 TG 148 - Jan 2025 TC 130 TG 98 HDL 48 LDL 64 - upcomng labs with pcp   4. Sinus tachcyardia Patient with  frequent elevated heart rates in clinic. Checks heart rates regularly at home, typically in low 90s. Often has anxiety in the clinic   -no recent palpitations.     5. Chronic LBBB     6. GI bleed - admit 03/2023 - CT abdomen pelvis demonstrating colitis changes -Status post sigmoidoscopy demonstrating large mucosal ulceration in the rectum; lesion has been biopsied.   - intermittent blood in stools mainly on toilet paper.  Past Medical History:  Diagnosis Date   Aortic stenosis, mod 2021   And insufficiency, evaluation by Dr. Edith in 2000   Arthritis    Benign prostatic hypertrophy    PSA of 1.39 in 09/2010   Chronic kidney disease    Borderline; creatinine of 1.6 in 08/2009, but 1.15 in 05/2010   Chronic sinusitis    By MRI   Diverticulosis 2013   GERD (gastroesophageal reflux disease)    Hearing impaired    Hemorrhoids 2006   Hiatal hernia 2006   History of kidney stones    HOH (hard of hearing)    Hyperlipidemia    Elevated triglycerides   Hypertension    Mild internal carotid artery plaque on MRI/MRA in 2002   Hypothyroidism    IBS (irritable bowel syndrome)    Insomnia    Left bundle Lorenia Hoston block 2012  2012   Metabolic syndrome    Fasting hyperglycemia   Murmur    Nephrolithiasis 08/2009   2011   Obesity    Proctitis    radiation induced   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   s/p XRT   Seasonal allergies    Stroke (HCC)    TIA, no residual, seen on MRI   Tobacco abuse, in remission    60 pack years; quit in 1999   Uses hearing aid    bilateral     Allergies  Allergen Reactions   Norvasc  [Amlodipine  Besylate] Swelling    Ankle swelling   Codeine Nausea And Vomiting   Amoxicillin Rash     Current Outpatient Medications  Medication Sig Dispense Refill   acetaminophen  (TYLENOL ) 500 MG tablet Take 2 tablets (1,000 mg total) by mouth every 8 (eight) hours as needed for mild pain (pain score 1-3). 100 tablet 2   acyclovir (ZOVIRAX) 800 MG tablet Take 800  mg by mouth every 8 (eight) hours as needed. And give one tablet by mouth once daily.     ALPRAZolam  (XANAX ) 0.5 MG tablet Take 1 tablet (0.5 mg total) by mouth at bedtime. 30 tablet 0   alum & mag hydroxide-simeth (MAALOX PLUS) 400-400-40 MG/5ML suspension Take 5 mLs by mouth every 4 (four) hours as needed for indigestion.     Ascorbic Acid (VITAMIN C) 1000 MG tablet Take 1,000 mg by mouth daily.     docusate sodium  (COLACE) 100 MG capsule Take 1 capsule (100 mg total) by mouth 2 (two) times daily. 10 capsule 0   finasteride  (PROSCAR ) 5 MG tablet Take 1 tablet (5 mg total) by mouth daily. 90 tablet 3   fluticasone (CUTIVATE) 0.05 % cream SMARTSIG:Topical 1-2 Times Daily PRN     gabapentin  (NEURONTIN ) 100 MG capsule Take 1 capsule (100 mg total) by mouth at bedtime. 30 capsule 5   levothyroxine  (SYNTHROID ) 25 MCG tablet Take 25 mcg by mouth daily.     Melatonin 3 MG TABS Take 3 mg by mouth at bedtime. For sleep     pantoprazole  (PROTONIX ) 40 MG tablet Take 1 tablet (40 mg total) by mouth daily before breakfast. 90 tablet 3   Polyethyl Glycol-Propyl Glycol (SYSTANE OP) Place 1 drop into both eyes 2 (two) times daily as needed (dry eyes).     polyethylene glycol (MIRALAX  / GLYCOLAX ) 17 g packet Take 17 g by mouth daily. Can be spread to every other day in the setting of diarrhea.     psyllium (HYDROCIL/METAMUCIL) 95 % PACK Take 1 packet by mouth 2 (two) times daily.     silodosin  (RAPAFLO ) 8 MG CAPS capsule Take 1 capsule (8 mg total) by mouth 2 (two) times daily. 180 capsule 3   simvastatin  (ZOCOR ) 20 MG tablet Take 20 mg by mouth every evening.     traMADol  (ULTRAM ) 50 MG tablet Take 25-50 mg by mouth 2 (two) times daily as needed.     vitamin B-12 (CYANOCOBALAMIN ) 1000 MCG tablet Take 2,000 mcg by mouth daily.     No current facility-administered medications for this visit.     Past Surgical History:  Procedure Laterality Date   ANKLE FRACTURE SURGERY     left   BIOPSY  12/31/2022    Procedure: BIOPSY;  Surgeon: Shaaron Lamar HERO, MD;  Location: [facility];  Service: Endoscopy;;   BRONCHIAL BIOPSY  05/14/2023   Procedure: BRONCHIAL BIOPSIES;  Surgeon: Malka Domino, MD;  Location: [facility];  Service: Pulmonary;;  BRONCHIAL NEEDLE ASPIRATION BIOPSY  05/14/2023   Procedure: BRONCHIAL NEEDLE ASPIRATION BIOPSIES;  Surgeon: Malka Domino, MD;  Location[facility];  Service: Pulmonary;;   CATARACT EXTRACTION Left    CATARACT EXTRACTION W/PHACO Right 12/19/2013   Procedure: CATARACT EXTRACTION PHACO AND INTRAOCULAR LENS PLACEMENT RIGHT EYE CDE=14.78;  Surgeon: Cherene Mania, MD;  Location: [facility];  Service: Ophthalmology;  Laterality: Right;   CHOLECYSTECTOMY     COLONOSCOPY  2006   Dr. Retia mucosa throughout colon, moderate internal hemorrhoids. bx= benign colonic mucosa   COLONOSCOPY  12/03/2011   Dr. Shaaron- colonic diverticulosis, radiation-induced proctitis- s/p APC ablation, no microscopic colitis on bx   COLONOSCOPY WITH PROPOFOL  N/A 06/12/2020   Procedure: COLONOSCOPY WITH PROPOFOL ;  Surgeon: Shaaron Lamar HERO, MD;  Location: [facility];  Service: Endoscopy;  Laterality: N/A;  2:45pm   ESOPHAGOGASTRODUODENOSCOPY  2006   Dr. Celestia- small hiatal hernia and slightly reddened distal esophagus o/w normal   ESOPHAGOGASTRODUODENOSCOPY (EGD) WITH PROPOFOL  N/A 12/31/2022   Procedure: ESOPHAGOGASTRODUODENOSCOPY (EGD) WITH PROPOFOL ;  Surgeon: Shaaron Lamar HERO, MD;  Location: [facility];  Service: Endoscopy;  Laterality: N/A;  1245pm, asa 3   FLEXIBLE SIGMOIDOSCOPY N/A 07/13/2013   MFM:[id number] changes of the rectum with active oozing consistent with chronic radiation proctitis.  Otherwise, negative sigmoidoscopy to 40 cm.Status post argon plasma coagulation ablation    FLEXIBLE SIGMOIDOSCOPY N/A 04/02/2023   Procedure: FLEXIBLE SIGMOIDOSCOPY;  Surgeon: Cinderella Deatrice FALCON, MD;  Location: [facility];  Service: Endoscopy;  Laterality: N/A;   HOT  HEMOSTASIS N/A 07/13/2013   Procedure: HOT HEMOSTASIS (ARGON PLASMA COAGULATION/BICAP);  Surgeon: Lamar HERO Shaaron, MD;  Location: [facility];  Service: Endoscopy;  Laterality: N/A;   HOT HEMOSTASIS  04/02/2023   Procedure: HOT HEMOSTASIS (ARGON PLASMA COAGULATION/BICAP);  Surgeon: Cinderella Deatrice FALCON, MD;  Location: [facility];  Service: Endoscopy;;   Ileocolonoscopy  12/03/2011   MFM:Rnonwpr diverticulosis. Radiation-induced proctitis status post APC ablation. Status post segmental colon Biopsy   MALONEY DILATION N/A 12/31/2022   Procedure: MALONEY DILATION;  Surgeon: Shaaron Lamar HERO, MD;  Location: [facility];  Service: Endoscopy;  Laterality: N/A;   TONSILLECTOMY       Allergies  Allergen Reactions   Norvasc  [Amlodipine  Besylate] Swelling    Ankle swelling   Codeine Nausea And Vomiting   Amoxicillin Rash      Family History  Problem Relation Age of Onset   Heart failure Mother    Heart attack Father    Heart failure Brother        bladder cancer (agent orange exposure)   Cirrhosis Brother        chronic viral hepatitis   Colon cancer Neg Hx      Social History Mr. Hoobler reports that he quit smoking about 26 years ago. His smoking use included cigarettes. He started smoking about 66 years ago. He has a 60 pack-year smoking history. He has never been exposed to tobacco smoke. He has never used smokeless tobacco. Mr. Macnaughton reports no history of alcohol use.     Physical Examination Today's Vitals   03/28/24 1402  BP: 130/75  Pulse: 95  SpO2: 100%  Weight: 165 lb (74.8 kg)  Height: 5' 5 (1.651 m)   Body mass index is 27.46 kg/m.  Gen: resting comfortably, no acute distress HEENT: no scleral icterus, pupils equal round and reactive, no palptable cervical adenopathy,  CV: RRR, 3/6 systolic murmur rusb, no jvd Resp: Clear to auscultation bilaterally GI: abdomen  is soft, non-tender, non-distended, normal bowel sounds, no hepatosplenomegaly MSK: extremities  are warm, no edema.  Skin: warm, no rash Neuro:  no focal deficits Psych: appropriate affect   Diagnostic Studies  07/2012 Echo LVEF 55-60%, no WMA, mild-mod AS (mean grad 11, AVA by planimetry 1.2, continuity area not listed)   07/2014 echo Study Conclusions  - Left ventricle: The cavity size was normal. Wall thickness was   increased in a pattern of mild LVH. Systolic function was normal.   The estimated ejection fraction was in the range of 60% to 65%.   Wall motion was normal; there were no regional wall motion   abnormalities. Doppler parameters are consistent with abnormal   left ventricular relaxation (grade 1 diastolic dysfunction). - Ventricular septum: Septal motion showed abnormal function and   dyssynergy. - Aortic valve: Moderately calcified annulus. Trileaflet;   moderately calcified leaflets. Cusp separation was moderately   reduced. There was moderate stenosis. There was mild   regurgitation. Mean gradient (S): 18 mm Hg. Peak gradient (S): 32   mm Hg. VTI ratio of LVOT to aortic valve: 0.43. Valve area (VTI):   1.25 cm^2. Planimetry valve area 1.5 cm2. - Mitral valve: Moderately calcified annulus. There was trivial   regurgitation. - Left atrium: The atrium was mildly dilated. - Right atrium: Central venous pressure (est): 3 mm Hg. - Atrial septum: No defect or patent foramen ovale was identified. - Tricuspid valve: There was trivial regurgitation. - Pulmonary arteries: Systolic pressure could not be accurately   estimated. - Pericardium, extracardiac: There was no pericardial effusion.     07/2016 echo Study Conclusions   - Left ventricle: The cavity size was normal. Wall thickness was   increased in a pattern of mild LVH. Systolic function was normal.   The estimated ejection fraction was in the range of 60% to 65%.   Wall motion was normal; there were no regional wall motion   abnormalities. - Aortic valve: Moderately calcified annulus. Moderately  calcified   leaflets. There was mild regurgitation. Mean gradient (S): 18 mm   Hg. Peak gradient (S): 36 mm Hg. VTI ratio of LVOT to aortic   valve: 0.38. Valve area (VTI): 0.77 cm^2. Somewhat discordant   information in grading degree of aortic stenosis. Gradients   suggest mild range, however valve appearance and LVOT to AV   ratios are more consistent with moderate to severe aortic   stenosis. - Mitral valve: Moderately calcified annulus. There was trivial   regurgitation. - Right atrium: Central venous pressure (est): 3 mm Hg. - Atrial septum: No defect or patent foramen ovale was identified. - Tricuspid valve: There was trivial regurgitation. - Pulmonary arteries: Systolic pressure could not be accurately   estimated. - Pericardium, extracardiac: A prominent pericardial fat pad was   present.   Impressions:   - Mild LVH with LVEF 60-65%. Grade 2 diastolic dysfunction   suspected. Aortic valve is moderately calcified with at least   moderate calcific aortic stenosis as detailed above, information   is somewhat discordant however. Mild aortic regurgitation.   Moderate mitral annular calcification with trivial mitral   regurgitation. Trivial tricuspid regurgitation.   06/2017 echo Study Conclusions   - Left ventricle: The cavity size was normal. There was severe   concentric hypertrophy. Systolic function was normal. The   estimated ejection fraction was in the range of 60% to 65%. Wall   motion was normal; there were no regional wall motion   abnormalities. Features are consistent  with a pseudonormal left   ventricular filling pattern, with concomitant abnormal relaxation   and increased filling pressure (grade 2 diastolic dysfunction).   Doppler parameters are consistent with high ventricular filling   pressure. - Ventricular septum: Septal motion showed abnormal function and   dyssynergy. These changes are consistent with a left bundle   [place] block. - Aortic valve:  Moderately to severely calcified annulus.   Trileaflet; moderately calcified leaflets. There was moderate to   severe stenosis. There was mild regurgitation. Peak velocity (S):   325 cm/s. Mean gradient (S): 22 mm Hg. Valve area (VTI): 1 cm^2.   Valve area (Vmax): 0.9 cm^2. Valve area (Vmean): 0.99 cm^2. - Mitral valve: Moderately calcified annulus. - Right ventricle: Systolic function was mildly reduced. - Atrial septum: No defect or patent foramen ovale was identified.   07/2018 echo IMPRESSIONS     1. The left ventricle has hyperdynamic systolic function, with an  ejection fraction of >65%. There is moderate asymmetric septal left  ventricular hypertrophy. Diastolic parameters are consistent with  restrictive filling.   2. The right ventricle has normal systolic function. The cavity was  normal. There is no increase in right ventricular wall thickness. Right  ventricular systolic pressure could not be assessed.   3. The mitral valve is normal in structure. There is moderate mitral  annular calcification present.   4. The aortic root is normal in size and structure.   5. The tricuspid valve is normal in structure.   6. Aortic valve is possibly tricuspid, but leaflet structure not well  defined. Aortic valve regurgitation is mild by color flow Doppler. There  is moderate to more likely severe aortic stenosis with LVOT/AV VTI 0.33  and calculated valve area 0.83 cm2.  Gradients suggest paradoxical low gradient stenosis in the setting of  normal LVEF.      12/2021 echo 1. The aortic valve is tricuspid. There is severe calcifcation of the  aortic valve. There is severe thickening of the aortic valve. Aortic valve  regurgitation is mild. Severe aortic valve stenosis. Aortic valve mean  gradient measures 35.0 mmHg. Aortic  valve Vmax measures 3.90 m/s. AVA by continuity 0.82cm2, DI 0.24.   2. Left ventricular ejection fraction, by estimation, is 60 to 65%. The  left ventricle has  normal function. The left ventricle has no regional  wall motion abnormalities. 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 Diastolic function  indeterminant due to severe MAC.   3. Right ventricular systolic function is normal. The right ventricular  size is normal.   4. Left atrial size was moderately dilated.   5. The mitral valve is degenerative. Trivial mitral valve regurgitation.  Mild calcific mitral stenosis. The mean mitral valve gradient is 4.0 mmHg  at HR 74bpm. Severe mitral annular calcification.   6. The inferior vena cava is normal in size with greater than 50%  respiratory variability, suggesting right atrial pressure of 3 mmHg.      02/2023 echo 1. Left ventricular ejection fraction, by estimation, is 60 to 65%. The  left ventricle has normal function. The left ventricle has no regional  wall motion abnormalities. There is moderate left ventricular hypertrophy.  Left ventricular diastolic  parameters are consistent with Grade I diastolic dysfunction (impaired  relaxation). Elevated left atrial pressure.   2. Right ventricular systolic function is normal. The right ventricular  size is normal. Tricuspid regurgitation signal is inadequate for assessing  PA pressure.   3. The mitral valve is  abnormal. Mild mitral valve regurgitation. No  evidence of mitral stenosis. The mean mitral valve gradient is 3.0 mmHg.HR  65 bpm. Moderate mitral annular calcification.   4. The aortic valve was not well visualized. There is severe calcifcation  of the aortic valve. There is severe thickening of the aortic valve.  Aortic valve regurgitation is mild. Severe aortic valve stenosis. Aortic  valve mean gradient measures 39.0  mmHg. Aortic valve peak gradient measures 50.1 mmHg. Aortic valve area, by  VTI measures 0.93 cm.   5. The inferior vena cava is normal in size with greater than 50%  respiratory variability, suggesting right atrial pressure of 3 mmHg.     08/2023 echo 1. Left ventricular ejection fraction, by estimation, is 55 to 60%. The  left ventricle has normal function. The left ventricle has no regional  wall motion abnormalities. There is moderate left ventricular hypertrophy.  Left ventricular diastolic  parameters are indeterminate.   2. Right ventricular systolic function is normal. The right ventricular  size is normal. Tricuspid regurgitation signal is inadequate for assessing  PA pressure.   3. Left atrial size was mildly dilated.   4. The mitral valve is abnormal. No evidence of mitral valve  regurgitation. Moderate mitral stenosis. The mean mitral valve gradient is  9.5 mmHg.   5. Paradoxical low flow low gradient moderate to severe aortic stenosis.  Mean gradient 24 mmHg, AVI VTI 0.87, DI 0.28, SVI 29. Angle of acquisition  may also lead to underestimation of AV gradients. . The aortic valve was  not well visualized. There is  moderate calcification of the aortic valve. There is moderate thickening  of the aortic valve. Aortic valve regurgitation is mild to moderate.   6. The inferior vena cava is normal in size with greater than 50%  respiratory variability, suggesting right atrial pressure of 3 mmHg.   Assessment and Plan  1. Aortic stenosis - severe AS by echo, normal LVEF. No reproted symptoms but exertion is limited due to prior vertebral fracture with chronic pain and chronic neuropathy.  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 HLD -lipids at goal, continue current meds  4.Mitral stenosis - repeat echo   Dorn PHEBE Ross, M.D.

## 2024-03-30 ENCOUNTER — Ambulatory Visit: Admitting: Urology

## 2024-03-30 VITALS — BP 132/77 | HR 111

## 2024-03-30 DIAGNOSIS — N200 Calculus of kidney: Secondary | ICD-10-CM

## 2024-03-30 DIAGNOSIS — N138 Other obstructive and reflux uropathy: Secondary | ICD-10-CM

## 2024-03-30 DIAGNOSIS — R3912 Poor urinary stream: Secondary | ICD-10-CM

## 2024-03-30 DIAGNOSIS — N401 Enlarged prostate with lower urinary tract symptoms: Secondary | ICD-10-CM

## 2024-03-30 LAB — URINALYSIS, ROUTINE W REFLEX MICROSCOPIC
Bilirubin, UA: NEGATIVE
Glucose, UA: NEGATIVE
Ketones, UA: NEGATIVE
Leukocytes,UA: NEGATIVE
Nitrite, UA: NEGATIVE
Protein,UA: NEGATIVE
RBC, UA: NEGATIVE
Specific Gravity, UA: 1.02 (ref 1.005–1.030)
Urobilinogen, Ur: 0.2 mg/dL (ref 0.2–1.0)
pH, UA: 5.5 (ref 5.0–7.5)

## 2024-03-30 LAB — BLADDER SCAN AMB NON-IMAGING: Scan Result: 0

## 2024-03-30 MED ORDER — CIPROFLOXACIN HCL 500 MG PO TABS
500.0000 mg | ORAL_TABLET | Freq: Once | ORAL | Status: AC
  Administered 2024-03-30: 500 mg via ORAL

## 2024-03-30 NOTE — Progress Notes (Signed)
   Patient can void prior to the bladder scan. Bladder scan result: 0  Performed By: [facility] L[facility]

## 2024-03-30 NOTE — Progress Notes (Signed)
 03/30/2024 2:54 PM   Calvin Moreno 1941/03/22 [id number]  Referring provider: Shona Norleen PEDLAR, MD [address] [place],  [place] [address]  Followup BPH   HPI: Calvin Moreno is a 82yo here for followup for BPH and weak urinary stream. IPSS 22 QOL 4 on rapaflo  8mg  BID and finasteride  5mg  daily. Urine stream is weak. He has straining to urinate. Nocturia 3-4x. He has urinary hestitancy.    PMH: Past Medical History:  Diagnosis Date   Aortic stenosis, mod 2021   And insufficiency, evaluation by Dr. Edith in 2000   Arthritis    Benign prostatic hypertrophy    PSA of 1.39 in 09/2010   Chronic kidney disease    Borderline; creatinine of 1.6 in 08/2009, but 1.15 in 05/2010   Chronic sinusitis    By MRI   Diverticulosis 2013   GERD (gastroesophageal reflux disease)    Hearing impaired    Hemorrhoids 2006   Hiatal hernia 2006   History of kidney stones    HOH (hard of hearing)    Hyperlipidemia    Elevated triglycerides   Hypertension    Mild internal carotid artery plaque on MRI/MRA in 2002   Hypothyroidism    IBS (irritable bowel syndrome)    Insomnia    Left bundle branch block 2012   2012   Metabolic syndrome    Fasting hyperglycemia   Murmur    Nephrolithiasis 08/2009   2011   Obesity    Proctitis    radiation induced   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   s/p XRT   Seasonal allergies    Stroke (HCC)    TIA, no residual, seen on MRI   Tobacco abuse, in remission    60 pack years; quit in 1999   Uses hearing aid    bilateral    Surgical History: Past Surgical History:  Procedure Laterality Date   ANKLE FRACTURE SURGERY     left   BIOPSY  12/31/2022   Procedure: BIOPSY;  Surgeon: Shaaron Lamar HERO, MD;  [facility]: [facility];  Service: Endoscopy;;   BRONCHIAL BIOPSY  05/14/2023   Procedure: BRONCHIAL BIOPSIES;  Surgeon: Malka Domino, MD;  Location: [facility];  Service: Pulmonary;;   BRONCHIAL NEEDLE ASPIRATION BIOPSY  05/14/2023   Procedure:  BRONCHIAL NEEDLE ASPIRATION BIOPSIES;  Surgeon: Malka Domino, MD;  Location: [facility];  Service: Pulmonary;;   CATARACT EXTRACTION Left    CATARACT EXTRACTION W/PHACO Right 12/19/2013   Procedure: CATARACT EXTRACTION PHACO AND INTRAOCULAR LENS PLACEMENT RIGHT EYE CDE=14.78;  Surgeon: Cherene Mania, MD;  Location: [facility];  Service: Ophthalmology;  Laterality: Right;   CHOLECYSTECTOMY     COLONOSCOPY  2006   Dr. Retia mucosa throughout colon, moderate internal hemorrhoids. bx= benign colonic mucosa   COLONOSCOPY  12/03/2011   Dr. Shaaron- colonic diverticulosis, radiation-induced proctitis- s/p APC ablation, no microscopic colitis on bx   COLONOSCOPY WITH PROPOFOL  N/A 06/12/2020   Procedure: COLONOSCOPY WITH PROPOFOL ;  Surgeon: Shaaron Lamar HERO, MD;  [facility]: [facility];  Service: Endoscopy;  Laterality: N/A;  2:45pm   ESOPHAGOGASTRODUODENOSCOPY  2006   Dr. Celestia- small hiatal hernia and slightly reddened distal esophagus o/w normal   ESOPHAGOGASTRODUODENOSCOPY (EGD) WITH PROPOFOL  N/A 12/31/2022   Procedure: ESOPHAGOGASTRODUODENOSCOPY (EGD) WITH PROPOFOL ;  Surgeon: Shaaron Lamar HERO, MD;  [facility];  Service: Endoscopy;  Laterality: N/A;  1245pm, asa 3   FLEXIBLE SIGMOIDOSCOPY N/A 07/13/2013   MFM:[id number] changes of the rectum with active oozing  consistent with chronic radiation proctitis.  Otherwise, negative sigmoidoscopy to 40 cm.Status post argon plasma coagulation ablation    FLEXIBLE SIGMOIDOSCOPY N/A 04/02/2023   Procedure: FLEXIBLE SIGMOIDOSCOPY;  Surgeon: Cinderella Deatrice FALCON, MD;  [facility];  Service: Endoscopy;  Laterality: N/A;   HOT HEMOSTASIS N/A 07/13/2013   Procedure: HOT HEMOSTASIS (ARGON PLASMA COAGULATION/BICAP);  Surgeon: Lamar CHRISTELLA Hollingshead, MD;  [facility];  Service: Endoscopy;  Laterality: N/A;   HOT HEMOSTASIS  04/02/2023   Procedure: HOT HEMOSTASIS (ARGON PLASMA COAGULATION/BICAP);  Surgeon: Cinderella Deatrice FALCON, MD;  [facility]:  [facility];  Service: Endoscopy;;   Ileocolonoscopy  12/03/2011   MFM:Rnonwpr diverticulosis. Radiation-induced proctitis status post APC ablation. Status post segmental colon Biopsy   MALONEY DILATION N/A 12/31/2022   Procedure: MALONEY DILATION;  Surgeon: Hollingshead Lamar CHRISTELLA, MD;  [facility];  Service: Endoscopy;  Laterality: N/A;   TONSILLECTOMY      Home Medications:  Allergies as of 03/30/2024       Reactions   Norvasc  [amlodipine  Besylate] Swelling   Ankle swelling   Codeine Nausea And Vomiting   Amoxicillin Rash        Medication List        Accurate as of March 30, 2024  2:54 PM. If you have any questions, ask your nurse or doctor.          acetaminophen  500 MG tablet Commonly known as: TYLENOL  Take 2 tablets (1,000 mg total) by mouth every 8 (eight) hours as needed for mild pain (pain score 1-3).   acyclovir 800 MG tablet Commonly known as: ZOVIRAX Take 800 mg by mouth every 8 (eight) hours as needed. And give one tablet by mouth once daily.   ALPRAZolam  0.5 MG tablet Commonly known as: XANAX  Take 1 tablet (0.5 mg total) by mouth at bedtime.   alum & mag hydroxide-simeth 400-400-40 MG/5ML suspension Commonly known as: MAALOX PLUS Take 5 mLs by mouth every 4 (four) hours as needed for indigestion.   cyanocobalamin  1000 MCG tablet Commonly known as: VITAMIN B12 Take 2,000 mcg by mouth daily.   docusate sodium  100 MG capsule Commonly known as: COLACE Take 1 capsule (100 mg total) by mouth 2 (two) times daily.   finasteride  5 MG tablet Commonly known as: PROSCAR  Take 1 tablet (5 mg total) by mouth daily.   fluticasone 0.05 % cream Commonly known as: CUTIVATE SMARTSIG:Topical 1-2 Times Daily PRN   furosemide  20 MG tablet Commonly known as: LASIX  Take 20 mg by mouth daily.   gabapentin  100 MG capsule Commonly known as: Neurontin  Take 1 capsule (100 mg total) by mouth at bedtime.   levothyroxine  25 MCG tablet Commonly known as:  SYNTHROID  Take 25 mcg by mouth daily.   melatonin 3 MG Tabs tablet Take 3 mg by mouth at bedtime. For sleep   pantoprazole  40 MG tablet Commonly known as: PROTONIX  Take 1 tablet (40 mg total) by mouth daily before breakfast.   polyethylene glycol 17 g packet Commonly known as: MIRALAX  / GLYCOLAX  Take 17 g by mouth daily. Can be spread to every other day in the setting of diarrhea.   potassium chloride  10 MEQ tablet Commonly known as: KLOR-CON  Take 10 mEq by mouth daily.   psyllium 95 % Pack Commonly known as: HYDROCIL/METAMUCIL Take 1 packet by mouth 2 (two) times daily.   silodosin  8 MG Caps capsule Commonly known as: RAPAFLO  Take 1 capsule (8 mg total) by mouth 2 (two) times daily.  simvastatin  20 MG tablet Commonly known as: ZOCOR  Take 20 mg by mouth every evening.   SYSTANE OP Place 1 drop into both eyes 2 (two) times daily as needed (dry eyes).   traMADol  50 MG tablet Commonly known as: ULTRAM  Take 25-50 mg by mouth 2 (two) times daily as needed.   vitamin C 1000 MG tablet Take 1,000 mg by mouth daily.        Allergies:  Allergies  Allergen Reactions   Norvasc  [Amlodipine  Besylate] Swelling    Ankle swelling   Codeine Nausea And Vomiting   Amoxicillin Rash    Family History: Family History  Problem Relation Age of Onset   Heart failure Mother    Heart attack Father    Heart failure Brother        bladder cancer (agent orange exposure)   Cirrhosis Brother        chronic viral hepatitis   Colon cancer Neg Hx     Social History:  reports that he quit smoking about 26 years ago. His smoking use included cigarettes. He started smoking about 66 years ago. He has a 60 pack-year smoking history. He has never been exposed to tobacco smoke. He has never used smokeless tobacco. He reports that he does not drink alcohol and does not use drugs.  ROS: All other review of systems were reviewed and are negative except what is noted above in HPI  Physical  Exam: BP 132/77   Pulse (!) 111   Constitutional:  Alert and oriented, No acute distress. HEENT: [place] AT, moist mucus membranes.  Trachea midline, no masses. Cardiovascular: No clubbing, cyanosis, or edema. Respiratory: Normal respiratory effort, no increased work of breathing. GI: Abdomen is soft, nontender, nondistended, no abdominal masses GU: No CVA tenderness.  Lymph: No cervical or inguinal lymphadenopathy. Skin: No rashes, bruises or suspicious lesions. Neurologic: Grossly intact, no focal deficits, moving all 4 extremities. Psychiatric: Normal mood and affect.  Laboratory Data: Lab Results  Component Value Date   WBC 11.1 (H) 05/14/2023   HGB 12.6 (L) 05/14/2023   HCT 39.7 05/14/2023   MCV 91.5 05/14/2023   PLT 324 05/14/2023    Lab Results  Component Value Date   CREATININE 1.10 09/24/2023    No results found for: PSA  No results found for: TESTOSTERONE  Lab Results  Component Value Date   HGBA1C 5.9 06/06/2010    Urinalysis    Component Value Date/Time   COLORURINE YELLOW 03/30/2023 1930   APPEARANCEUR Clear 09/23/2023 1318   LABSPEC 1.023 03/30/2023 1930   PHURINE 5.0 03/30/2023 1930   GLUCOSEU Negative 09/23/2023 1318   HGBUR NEGATIVE 03/30/2023 1930   BILIRUBINUR Negative 09/23/2023 1318   KETONESUR NEGATIVE 03/30/2023 1930   PROTEINUR Negative 09/23/2023 1318   PROTEINUR NEGATIVE 03/30/2023 1930   UROBILINOGEN negative (A) 10/19/2019 1444   UROBILINOGEN 0.2 08/26/2009 0930   NITRITE Negative 09/23/2023 1318   NITRITE NEGATIVE 03/30/2023 1930   LEUKOCYTESUR Negative 09/23/2023 1318   LEUKOCYTESUR NEGATIVE 03/30/2023 1930    Lab Results  Component Value Date   LABMICR Comment 09/23/2023   BACTERIA RARE (A) 06/17/2018    Pertinent Imaging:  No results found for this or any previous visit.  No results found for this or any previous visit.  No results found for this or any previous visit.  No results found for this or any previous  visit.  Results for orders placed during the hospital encounter of 10/04/21  Ultrasound renal complete  Narrative CLINICAL  DATA:  Nephrolithiasis.  EXAM: RENAL / URINARY TRACT ULTRASOUND COMPLETE  COMPARISON:  Renal ultrasound 10/02/2020.  FINDINGS: Right Kidney:  Renal measurements: 11.2 x 4.7 x 5.5 cm = volume: 149 mL. Echogenicity within normal limits. There is no hydronephrosis. There is an exophytic cyst in the inferior pole measuring 4.6 x 4.1 x 4.3 cm, minimally decreased in size compared to the prior study.  Left Kidney:  Renal measurements: 12.6 x 5.0 x 4.8 cm = volume: 156 mL. Echogenicity within normal limits. No mass or hydronephrosis visualized.  Bladder:  Appears normal for degree of bladder distention.  Other:  None.  IMPRESSION: 1. No acute abnormality involving the kidneys. 2. Right renal cyst measuring 4.6 cm.   Electronically Signed By: Greig Pique M.D. On: 10/07/2021 17:23  No results found for this or any previous visit.  No results found for this or any previous visit.  Results for orders placed during the hospital encounter of 06/17/18  CT RENAL STONE STUDY  Narrative CLINICAL DATA:  Right flank pain starting this morning with nausea and vomiting.  EXAM: CT ABDOMEN AND PELVIS WITHOUT CONTRAST  TECHNIQUE: Multidetector CT imaging of the abdomen and pelvis was performed following the standard protocol without IV contrast.  COMPARISON:  08/26/2009  FINDINGS: Lower chest: Mitral annular calcification. Coronary atherosclerosis. No acute finding  Hepatobiliary: No focal liver abnormality.Cholecystectomy with normal common bile duct diameter.  Pancreas: Unremarkable.  Spleen: Unremarkable.  Adrenals/Urinary Tract: Negative adrenals. Mild right hydroureteronephrosis secondary to a 3 mm stone at the iliac vessel crossing. No additional urolithiasis. Chronic and nonspecific perinephric stranding on both sides. Exophytic  right lower pole renal cyst. Prominent bladder wall thickness but under distended.  Stomach/Bowel:  No obstruction. No evident bowel inflammation.  Vascular/Lymphatic: Atherosclerotic calcification of the aorta and iliacs. No mass or adenopathy.  Reproductive:Prostatic calcifications and right-sided fiducial markers.  Other: No ascites or pneumoperitoneum.  Musculoskeletal: No acute abnormalities.  Osteopenia.  IMPRESSION: Mild right hydroureteronephrosis due to a 3 mm stone at the level of the iliac crossing.   Electronically Signed By: Cassondra Roulette M.D. On: 06/17/2018 07:09    03/30/24  CC: No chief complaint on file.   HPI:  Blood pressure 132/77, pulse (!) 111. NED. A&Ox3.   No respiratory distress   Abd soft, NT, ND Normal phallus with bilateral descended testicles  Cystoscopy Procedure Note  Patient identification was confirmed, informed consent was obtained, and patient was prepped using Betadine  solution.  Lidocaine  jelly was administered per urethral meatus.     Pre-Procedure: - Inspection reveals a normal caliber ureteral meatus.  Procedure: The flexible cystoscope was introduced without difficulty - No urethral strictures/lesions are present. - Enlarged prostate no median lobe - Normal bladder neck - Bilateral ureteral orifices identified - Bladder mucosa  reveals no ulcers, tumors, or lesions - No bladder stones - No trabeculation     Post-Procedure: - Patient tolerated the procedure well  Assessment & Plan:    1. Benign prostatic hyperplasia with urinary obstruction (Primary) We discussed the management of his BPH including continued medical therapy, Rezum, Urolift, TURP and simple prostatectomy. After discussing the options the patient has elected to proceed with Urolift. Risks/benefits/alternatives discussed.  - Urinalysis, Routine w reflex microscopic - BLADDER SCAN AMB NON-IMAGING  2. Weak urinary stream Continue rapalfo 8mg   daily     No follow-ups on file.  Belvie Clara, MD  [facility] [place]

## 2024-03-31 ENCOUNTER — Ambulatory Visit: Admitting: Cardiology

## 2024-04-05 ENCOUNTER — Other Ambulatory Visit: Payer: Self-pay

## 2024-04-05 ENCOUNTER — Encounter: Payer: Self-pay | Admitting: Urology

## 2024-04-05 DIAGNOSIS — C3412 Malignant neoplasm of upper lobe, left bronchus or lung: Secondary | ICD-10-CM

## 2024-04-05 DIAGNOSIS — C3490 Malignant neoplasm of unspecified part of unspecified bronchus or lung: Secondary | ICD-10-CM

## 2024-04-05 NOTE — Patient Instructions (Signed)

## 2024-04-06 ENCOUNTER — Ambulatory Visit (HOSPITAL_COMMUNITY)
Admission: RE | Admit: 2024-04-06 | Discharge: 2024-04-06 | Disposition: A | Discharge status: Home / Self Care | Source: Ambulatory Visit | Attending: Hematology | Admitting: Hematology

## 2024-04-06 ENCOUNTER — Inpatient Hospital Stay: Attending: Oncology

## 2024-04-06 DIAGNOSIS — C3412 Malignant neoplasm of upper lobe, left bronchus or lung: Secondary | ICD-10-CM | POA: Insufficient documentation

## 2024-04-06 DIAGNOSIS — C61 Malignant neoplasm of prostate: Secondary | ICD-10-CM | POA: Insufficient documentation

## 2024-04-06 DIAGNOSIS — G629 Polyneuropathy, unspecified: Secondary | ICD-10-CM | POA: Insufficient documentation

## 2024-04-06 DIAGNOSIS — G8929 Other chronic pain: Secondary | ICD-10-CM | POA: Diagnosis not present

## 2024-04-06 DIAGNOSIS — C3490 Malignant neoplasm of unspecified part of unspecified bronchus or lung: Secondary | ICD-10-CM

## 2024-04-06 LAB — CBC WITH DIFFERENTIAL/PLATELET
Abs Immature Granulocytes: 0.04 K/uL (ref 0.00–0.07)
Basophils Absolute: 0.1 K/uL (ref 0.0–0.1)
Basophils Relative: 0 %
Eosinophils Absolute: 0.1 K/uL (ref 0.0–0.5)
Eosinophils Relative: 1 %
HCT: 43.4 % (ref 39.0–52.0)
Hemoglobin: 14.1 g/dL (ref 13.0–17.0)
Immature Granulocytes: 0 %
Lymphocytes Relative: 7 %
Lymphs Abs: 0.8 K/uL (ref 0.7–4.0)
MCH: 29 pg (ref 26.0–34.0)
MCHC: 32.5 g/dL (ref 30.0–36.0)
MCV: 89.3 fL (ref 80.0–100.0)
Monocytes Absolute: 0.8 K/uL (ref 0.1–1.0)
Monocytes Relative: 7 %
Neutro Abs: 9.4 K/uL — ABNORMAL HIGH (ref 1.7–7.7)
Neutrophils Relative %: 85 %
Platelets: 362 K/uL (ref 150–400)
RBC: 4.86 MIL/uL (ref 4.22–5.81)
RDW: 13.8 % (ref 11.5–15.5)
WBC: 11.2 K/uL — ABNORMAL HIGH (ref 4.0–10.5)
nRBC: 0 % (ref 0.0–0.2)

## 2024-04-06 LAB — COMPREHENSIVE METABOLIC PANEL WITH GFR
ALT: 10 U/L (ref 0–44)
AST: 20 U/L (ref 15–41)
Albumin: 4.1 g/dL (ref 3.5–5.0)
Alkaline Phosphatase: 137 U/L — ABNORMAL HIGH (ref 38–126)
Anion gap: 10 (ref 5–15)
BUN: 13 mg/dL (ref 8–23)
CO2: 28 mmol/L (ref 22–32)
Calcium: 9.5 mg/dL (ref 8.9–10.3)
Chloride: 96 mmol/L — ABNORMAL LOW (ref 98–111)
Creatinine, Ser: 1 mg/dL (ref 0.61–1.24)
GFR, Estimated: >60 mL/min (ref >60)
Glucose, Bld: 213 mg/dL — ABNORMAL HIGH (ref 70–99)
Potassium: 4.5 mmol/L (ref 3.5–5.1)
Sodium: 134 mmol/L — ABNORMAL LOW (ref 135–145)
Total Bilirubin: 0.6 mg/dL (ref 0.0–1.2)
Total Protein: 7.9 g/dL (ref 6.5–8.1)

## 2024-04-06 MED ORDER — IOHEXOL 300 MG/ML  SOLN
75.0000 mL | Freq: Once | INTRAMUSCULAR | Status: AC | PRN
  Administered 2024-04-06: 75 mL via INTRAVENOUS

## 2024-04-08 ENCOUNTER — Ambulatory Visit: Payer: Self-pay | Admitting: Oncology

## 2024-04-08 NOTE — Progress Notes (Signed)
 CLAUDIUS Delon Hope, NP 04/08/2024 2:53 PM

## 2024-04-13 ENCOUNTER — Inpatient Hospital Stay (HOSPITAL_BASED_OUTPATIENT_CLINIC_OR_DEPARTMENT_OTHER): Admitting: Oncology

## 2024-04-13 VITALS — BP 117/71 | HR 111 | Temp 97.8°F | Resp 19 | Ht 66.0 in | Wt 176.0 lb

## 2024-04-13 DIAGNOSIS — G8929 Other chronic pain: Secondary | ICD-10-CM | POA: Diagnosis not present

## 2024-04-13 DIAGNOSIS — C3490 Malignant neoplasm of unspecified part of unspecified bronchus or lung: Secondary | ICD-10-CM

## 2024-04-13 DIAGNOSIS — G629 Polyneuropathy, unspecified: Secondary | ICD-10-CM | POA: Diagnosis not present

## 2024-04-13 DIAGNOSIS — C61 Malignant neoplasm of prostate: Secondary | ICD-10-CM

## 2024-04-13 DIAGNOSIS — D72829 Elevated white blood cell count, unspecified: Secondary | ICD-10-CM

## 2024-04-13 DIAGNOSIS — C3412 Malignant neoplasm of upper lobe, left bronchus or lung: Secondary | ICD-10-CM | POA: Diagnosis not present

## 2024-04-13 NOTE — Progress Notes (Signed)
 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 male was transferred to my care after his prior physician has left.   ASSESSMENT & PLAN:   Assessment & Plan: Calvin Moreno  is a 83 y.o. 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  S/p SBRT  -We reviewed the recent CT scan findings together.  There is enlargement in the previous SBRT site along with probably some lymph node involvement. - Recommend obtaining a PET scan to better assess the extent of the disease. - Might also need to obtain an MRI brain if PET scan shows metastatic disease. - Patient has a follow-up scheduled with Dr. Dannielle in the beginning of December.  Keep this appointment to talk about probably radiating the lesion. - Patient does not interested to get chemotherapy or biopsy at this time.  Return to clinic in 4 weeks to discuss results and further management.  Peripheral neuropathy Neuropathy in feet and legs causing discomfort and sleep disturbances. Gabapentin  provides minimal relief.  - Continue gabapentin  four times a day.  Chronic pain Pain managed with tramadol  and Tylenol . Tramadol  inadequate.  But patient does not wish to increase any pain medications at this time.  - Continue tramadol  and Tylenol .  Leukocytosis Slightly elevated white blood cell count, likely due to chronic back pain.  No B symptoms  - Monitor white blood cell count.  Prostate  adenocarcinoma Diagnosed in 2012, s/p radiation at [facility].  PSA monitored by primary care.    The patient understands the plans discussed today and is in agreement with them.  He knows to contact our office if he develops concerns prior to his next appointment.  25 minutes of total time was spent for this patient encounter, including preparation,review of records,  face-to-face counseling with the patient and coordination of care, physical exam, and documentation of the encounter.    Calvin Moreno,acting as a neurosurgeon for Mickiel Dry, MD.,have documented all relevant documentation on the behalf of Mickiel Dry, MD,as directed by  Mickiel Dry, MD while in the presence of Mickiel Dry, MD.  I, Mickiel Dry MD, have reviewed the above documentation for accuracy and completeness, and I agree with the above.    Mickiel Dry, MD  [facility] [place] - A DEPT OF [facility] [address] [place] [address] Dept: [phone] Dept Fax: [phone]   Orders Placed This Encounter  Procedures   NM PET Image Restag (PS) Skull Base To Thigh    Standing Status:   Future    Expected Date:   04/20/2024    Expiration Date:   04/13/2025    If indicated for the ordered procedure, I authorize the administration of a radiopharmaceutical per Radiology protocol:   Yes    Preferred imaging location?:   [facility]     ONCOLOGY HISTORY:   I have reviewed his chart and materials related  to his cancer extensively and collaborated history with the patient. 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 Contralateral 5 x 15 mm irregular nodule along the posterior right upper lobe. Small mediastinal  nodes, including a dominant 10 mm short axis subcarinal node, indeterminate. -04/28/2023: Initial PET: The irregular 2.3 by 1.7 cm anterior left upper lobe nodule has maximum SUV of 13.1, compatible with malignancy. A 1.0 cm subcarinal lymph node has a maximum SUV of 3.6, mildly above blood pool. This is nonspecific and could be reactive or metastatic. No findings of distant/extrathoracic metastatic disease. -05/14/2023: Bronchoscopy with biopsy.  Cytology: Squamous cell carcinoma. Tumor cells are strongly and diffusely positive for p40 and are negative for TTF-1. Lymph nodes 7, 4L, and 11L negative for malignancy. Staging: Stage IA (T1 cN0 M0) -06/08/2023: MRI Brain: NED -07/2023: SBRT to LUL nodule -04/06/2024: CT chest: Marked interval progression of the spiculated anterior left upper lobe lesion, now measuring on the order of 4.8 x 3.9 cm, increased from 1.4 x 0.9 cm previously. New 12 mm short axis prevascular node with clustered borderline enlarged lymph nodes in the AP window. Imaging features are concerning for metastatic disease.    Diagnosis: Prostate Cancer  -2012: Initial Diagnosis. S/p radiation therapy -09/2015: Normal PSA -05/2023: Normal PSA  Current Treatment:  TBD  INTERVAL HISTORY:   Discussed the use of AI scribe software for clinical note transcription with the patient, who gave verbal consent to proceed.  History of Present Illness Calvin Moreno is an 83 year old male with prostate cancer who presents for follow-up of his lung cancer and establish care with me.   His lung cancer was previously noted to be shrinking, but recent imaging shows an increase in size from 1.4 cm to approximately 5 cm. He is concerned about this progression and is not interested in chemotherapy due to past experiences of acquaintances who had adverse outcomes with it.  He has a history of prostate cancer treated with 40 radiation treatments. He did not receive any hormonal therapy  post-radiation. He is unsure about the current management of his prostate cancer but mentions that his PSA levels were checked several months ago by a now-retired doctor.  He experiences significant back pain, for which he takes tramadol  prescribed by his primary care physician, and Tylenol . These medications do not provide adequate relief.  He suffers from neuropathy in his feet and legs, causing numbness, tingling, and muscle pain, particularly at night. He takes gabapentin  four times a day but is uncertain about its effectiveness.  He has a history of smoking but quit in 1999. No other symptoms apart from his back pain and neuropathy.   I have reviewed the past medical history, past surgical history, social history and family history with the patient and they are unchanged from previous note.  ALLERGIES:  is allergic to norvasc  [amlodipine  besylate], codeine, and amoxicillin.  MEDICATIONS:  Current Outpatient Medications  Medication Sig Dispense Refill   acyclovir (ZOVIRAX) 800 MG tablet Take 800 mg by mouth every 8 (eight) hours as needed. And give one tablet by mouth once daily.     ALPRAZolam  (XANAX ) 0.5 MG tablet Take 1 tablet (0.5 mg total) by mouth at bedtime. 30 tablet 0   alum & mag hydroxide-simeth (MAALOX  PLUS) 400-400-40 MG/5ML suspension Take 5 mLs by mouth every 4 (four) hours as needed for indigestion.     Ascorbic Acid (VITAMIN C) 1000 MG tablet Take 1,000 mg by mouth daily.     docusate sodium  (COLACE) 100 MG capsule Take 1 capsule (100 mg total) by mouth 2 (two) times daily. 10 capsule 0   DULoxetine (CYMBALTA) 60 MG capsule Take 60 mg by mouth daily.     finasteride  (PROSCAR ) 5 MG tablet Take 1 tablet (5 mg total) by mouth daily. 90 tablet 3   fluticasone (CUTIVATE) 0.05 % cream SMARTSIG:Topical 1-2 Times Daily PRN     furosemide  (LASIX ) 20 MG tablet Take 20 mg by mouth daily.     gabapentin  (NEURONTIN ) 100 MG capsule Take 1 capsule (100 mg total) by mouth at bedtime. 30  capsule 5   levothyroxine  (SYNTHROID ) 25 MCG tablet Take 25 mcg by mouth daily.     Melatonin 3 MG TABS Take 3 mg by mouth at bedtime. For sleep     pantoprazole  (PROTONIX ) 40 MG tablet Take 1 tablet (40 mg total) by mouth daily before breakfast. 90 tablet 3   Polyethyl Glycol-Propyl Glycol (SYSTANE OP) Place 1 drop into both eyes 2 (two) times daily as needed (dry eyes).     polyethylene glycol (MIRALAX  / GLYCOLAX ) 17 g packet Take 17 g by mouth daily. Can be spread to every other day in the setting of diarrhea.     potassium chloride  (KLOR-CON ) 10 MEQ tablet Take 10 mEq by mouth daily.     psyllium (HYDROCIL/METAMUCIL) 95 % PACK Take 1 packet by mouth 2 (two) times daily.     silodosin  (RAPAFLO ) 8 MG CAPS capsule Take 1 capsule (8 mg total) by mouth 2 (two) times daily. 180 capsule 3   simvastatin  (ZOCOR ) 20 MG tablet Take 20 mg by mouth every evening.     traMADol  (ULTRAM ) 50 MG tablet Take 25-50 mg by mouth 2 (two) times daily as needed.     vitamin B-12 (CYANOCOBALAMIN ) 1000 MCG tablet Take 2,000 mcg by mouth daily.     No current facility-administered medications for this visit.    REVIEW OF SYSTEMS:   Constitutional: Denies fevers, chills or abnormal weight loss Eyes: Denies blurriness of vision Ears, nose, mouth, throat, and face: Denies mucositis or sore throat Respiratory: Denies cough, dyspnea or wheezes Cardiovascular: Denies palpitation, chest discomfort or lower extremity swelling Gastrointestinal:  Denies nausea, heartburn or change in bowel habits Skin: Denies abnormal skin rashes Lymphatics: Denies new lymphadenopathy or easy bruising Neurological:Denies numbness, tingling or new weaknesses Behavioral/Psych: Mood is stable, no new changes  All other systems were reviewed with the patient and are negative.   VITALS:  Blood pressure 117/71, pulse (!) 111, temperature 97.8 F (36.6 C), temperature source Tympanic, resp. rate 19, height 5' 6 (1.676 m), weight 176 lb  (79.8 kg), SpO2 100%.  Wt Readings from Last 3 Encounters:  04/13/24 176 lb (79.8 kg)  03/28/24 165 lb (74.8 kg)  03/08/24 161 lb 3.2 oz (73.1 kg)    Body mass index is 28.41 kg/m.  Performance status (ECOG): 2 - Symptomatic, <50% confined to bed  PHYSICAL EXAM:   GENERAL:alert, no distress and comfortable SKIN: skin color, texture, turgor are normal, no rashes or significant lesions LYMPH:  no palpable lymphadenopathy in the cervical, axillary or inguinal LUNGS: clear to auscultation and percussion with normal breathing effort HEART: regular rate & rhythm and no murmurs and no lower extremity edema ABDOMEN:abdomen soft,  non-tender and normal bowel sounds Musculoskeletal:no cyanosis of digits and no clubbing  NEURO: alert & oriented x 3 with fluent speech   LABORATORY DATA:  I have reviewed the data as listed   Lab Results  Component Value Date   WBC 11.2 (H) 04/06/2024   NEUTROABS 9.4 (H) 04/06/2024   HGB 14.1 04/06/2024   HCT 43.4 04/06/2024   MCV 89.3 04/06/2024   PLT 362 04/06/2024      Chemistry      Component Value Date/Time   NA 134 (L) 04/06/2024 1130   NA 137 04/06/2023 0000   K 4.5 04/06/2024 1130   CL 96 (L) 04/06/2024 1130   CO2 28 04/06/2024 1130   BUN 13 04/06/2024 1130   BUN 8 04/06/2023 0000   CREATININE 1.00 04/06/2024 1130   CREATININE 1.16 10/02/2014 0817   GLU 108 04/06/2023 0000      Component Value Date/Time   CALCIUM 9.5 04/06/2024 1130   ALKPHOS 137 (H) 04/06/2024 1130   AST 20 04/06/2024 1130   ALT 10 04/06/2024 1130   BILITOT 0.6 04/06/2024 1130       RADIOGRAPHIC STUDIES: I have personally reviewed the radiological images as listed and agreed with the findings in the report.  CT CHEST W CONTRAST CLINICAL DATA:  Non-small-cell lung cancer. Restaging. * Tracking Code: BO *.  EXAM: CT CHEST WITH CONTRAST  TECHNIQUE: Multidetector CT imaging of the chest was performed during intravenous contrast  administration.  RADIATION DOSE REDUCTION: This exam was performed according to the departmental dose-optimization program which includes automated exposure control, adjustment of the mA and/or kV according to patient size and/or use of iterative reconstruction technique.  CONTRAST:  75mL OMNIPAQUE  IOHEXOL  300 MG/ML  SOLN  COMPARISON:  09/24/2023  FINDINGS: Cardiovascular: The heart size is normal. No substantial pericardial effusion. Mitral annular calcification evident. Moderate atherosclerotic calcification is noted in the wall of the thoracic aorta.  Mediastinum/Nodes: New 12 mm short axis prevascular node on 52/2. Clustered borderline enlarged lymph nodes in the AP window are progressive. Stable 9 mm subcarinal lymph node. There is no hilar lymphadenopathy. The esophagus has normal imaging features. There is no axillary lymphadenopathy.  Lungs/Pleura: Centrilobular and paraseptal emphysema evident. Spiculated 14 x 9 mm anterior left upper lobe lesion is markedly progressive in the interval measuring on the order of 4.8 x 3.9 cm on the current study. Patchy airspace disease in the left lung apex is likely postobstructive infection/inflammation. No other new suspicious pulmonary nodule or mass. Dependent atelectasis noted in both lungs. Tiny left pleural effusion evident.  Upper Abdomen: Visualized portion of the upper abdomen shows no acute findings.  Musculoskeletal: No worrisome lytic or sclerotic osseous abnormality.  IMPRESSION: 1. Marked interval progression of the spiculated anterior left upper lobe lesion, now measuring on the order of 4.8 x 3.9 cm, increased from 1.4 x 0.9 cm previously. 2. New 12 mm short axis prevascular node with clustered borderline enlarged lymph nodes in the AP window. Imaging features are concerning for metastatic disease. 3. Patchy airspace disease in the left lung apex is likely postobstructive infection/inflammation. 4. Tiny left  pleural effusion. 5. Aortic Atherosclerosis (ICD10-I70.0) and Emphysema (ICD10-J43.9).  Electronically Signed   By: [name] Candle M.D.   On: 04/08/2024 09:00

## 2024-04-28 ENCOUNTER — Ambulatory Visit (HOSPITAL_COMMUNITY): Admission: RE | Admit: 2024-04-28 | Discharge: 2024-04-28 | Attending: Oncology

## 2024-04-28 DIAGNOSIS — C3490 Malignant neoplasm of unspecified part of unspecified bronchus or lung: Secondary | ICD-10-CM

## 2024-04-28 MED ORDER — FLUDEOXYGLUCOSE F - 18 (FDG) INJECTION
8.6000 | Freq: Once | INTRAVENOUS | Status: AC | PRN
  Administered 2024-04-28: 8.6 via INTRAVENOUS

## 2024-05-03 ENCOUNTER — Ambulatory Visit (HOSPITAL_COMMUNITY): Admission: RE | Admit: 2024-05-03 | Discharge: 2024-05-03 | Attending: Cardiology

## 2024-05-03 DIAGNOSIS — I350 Nonrheumatic aortic (valve) stenosis: Secondary | ICD-10-CM

## 2024-05-03 LAB — ECHOCARDIOGRAM COMPLETE
AR max vel: 0.45 cm2
AV Area VTI: 0.43 cm2
AV Area mean vel: 0.46 cm2
AV Mean grad: 28 mmHg
AV Peak grad: 48.4 mmHg
Ao pk vel: 3.48 m/s
Area-P 1/2: 5.38 cm2
P 1/2 time: 231 ms
S' Lateral: 3.6 cm
Single Plane A4C EF: 39 %

## 2024-05-03 MED ORDER — PERFLUTREN LIPID MICROSPHERE
1.0000 mL | INTRAVENOUS | Status: AC | PRN
  Administered 2024-05-03: 3 mL via INTRAVENOUS

## 2024-05-03 NOTE — Progress Notes (Signed)
*  PRELIMINARY RESULTS* Echocardiogram 2D Echocardiogram has been performed with Definity .  Calvin Moreno 05/03/2024, 3:15 PM

## 2024-05-09 NOTE — Progress Notes (Signed)
 [facility]  Hematology Oncology Consultation  Initial Visit Note  Patient Name: Calvin Moreno Patient Age: 83 y.o. Encounter Date: 05/11/2024  Referring Physician:  Dannielle Alm Locus, MD [address] [place],  [place] [address]  Primary Care Provider: Shona Norleen Folk, MD  Consulting Provider: Mertie Been Letty Raddle., MD  Hematology/Oncology  Reason for visit: Evaluation of recurrent non-small cell lung cancer.   Assessment/Plan: I explained to the patient that his lung cancer has progressed locally and the purpose of therapy is palliation and not cure.  If he is interested in pursuing further treatment at this point biomarkers are necessary to guide our therapy. Tempus xT from the biopsy done in December 2024 is pending. PD-L1 <1% is the only result available right now. Tempus xF (blood) is also pending. I would like to wait for such results prior to further recommendations.    Cancer Staging <redacted file path>  Squamous cell carcinoma of upper lobe of left lung (CMS-HCC) Staging form: Lung, AJCC V9 - Clinical stage from 05/14/2023: Stage IA3 (cT1c, cN0(f), cM0) - Signed by Dannielle Alm Locus, MD on 06/03/2023   I have reviewed the laboratory, pathology, and radiology reports in detail and discussed findings with the patient.  History of Present Illness:   Calvin Moreno is a 83 y.o. male who is seen in consultation at the request of Morris, Alm Locus, MD for an evaluation of Non-Small Cell Lung Cancer.  He was diagnosed with a Stage IA3 squamous cell carcinoma of the lung in December 2024 for which he received SBRT which he completed in February 2025.  CT scan of the chest on 04/06/2024 showed local regional progression.  PET/CT was positive only in the treated site concerning for local progression. The LUL mass increased from 1.4 cm post RT in May 2025 to 4.7 cm now.  He is not a candidate for any additional  radiation therapy at this point.  He was referred to us  for further evaluation. He is not much symptomatic from the local progression of his cancer.  Past Medical History[1]  Past Surgical History[2]   Family History[3]  Family Status  Relation Name Status   Mother  Deceased   Father  Deceased   Brother  Deceased  No partnership data on file   Social History   Occupational History   Not on file  Tobacco Use   Smoking status: Former    Current packs/day: 0.00    Average packs/day: 1.5 packs/day for 30.0 years (45.0 ttl pk-yrs)    Types: Cigarettes    Start date: 52    Quit date: 1999    Years since quitting: 26.9   Smokeless tobacco: Never  Vaping Use   Vaping status: Never Used  Substance and Sexual Activity   Alcohol  use: Not Currently   Drug use: Never   Sexual activity: Not Currently    Partners: Female    Allergies[4]  Current Medications[5]  Review of Systems  Constitutional: Negative.   HENT:  Negative.    Eyes: Negative.   Respiratory: Negative.    Cardiovascular: Negative.   Gastrointestinal: Negative.   Endocrine: Negative.   Genitourinary: Negative.    Musculoskeletal:  Positive for arthralgias.  Skin: Negative.   Hematological: Negative.      Vital Signs for this encounter: BSA: 1.81 meters squared BP 116/74   Pulse 119   Temp 36.8 C (98.2 F) (Temporal)   Resp 14   Ht  161.8 cm (5' 3.7)   Wt 72.8 kg (160 lb 6.4 oz)   SpO2 94%   BMI 27.79 kg/m   Physical Exam Constitutional:      Appearance: Normal appearance.  HENT:     Head: Normocephalic.     Nose: Nose normal.     Mouth/Throat:     Mouth: Mucous membranes are moist.     Pharynx: Oropharynx is clear.  Eyes:     Conjunctiva/sclera: Conjunctivae normal.     Pupils: Pupils are equal, round, and reactive to light.  Pulmonary:     Breath sounds: Normal breath sounds.  Abdominal:     General: Abdomen is flat.     Palpations: Abdomen is soft.  Musculoskeletal:      Cervical back: Neck supple.     Comments: Overall decreased range of motion.  Skin:    General: Skin is warm.  Neurological:     General: No focal deficit present.     Mental Status: He is alert.     Karnofsky/Lansky Performance Status 70, Cares for self; unable to carry on normal activity or to do active work (ECOG equivalent 1)   I spent 60 minutes reviewing the records and face-to-face with the patient today.   Mertie Azure, MD              [1] Past Medical History: Diagnosis Date   Anxiety    Arthritis    BPH (benign prostatic hyperplasia)    Disease of thyroid  gland    GERD (gastroesophageal reflux disease)    Hemorrhoids    Hiatal hernia 2006   History of radiation therapy 2012   Prostate Cancer   HOH (hard of hearing)    Hyperlipidemia    Hypertension    IBS (irritable bowel syndrome)    Left bundle branch block 2012   Lung cancer    (CMS-HCC)    Prostate cancer    (CMS-HCC)    Stroke    (CMS-HCC)   [2] Past Surgical History: Procedure Laterality Date   BRONCHOSCOPY     CATARACT EXTRACTION, BILATERAL     CHOLECYSTECTOMY     COLONOSCOPY     TONSILLECTOMY     UPPER GASTROINTESTINAL ENDOSCOPY    [3] Family History Problem Relation Age of Onset   Heart failure Mother    Heart attack Father    Cancer Brother    Heart failure Brother    Cirrhosis Brother    Hepatitis Brother   [4] Allergies Allergen Reactions   Amlodipine     Codeine    Amoxicillin Rash  [5] Current Outpatient Medications  Medication Sig Dispense Refill   acetaminophen  (TYLENOL ) 500 MG tablet Take 2 tablets (1,000 mg total) by mouth every six (6) hours as needed for pain.     acyclovir (ZOVIRAX) 800 MG tablet Take 1 tablet (800 mg total) by mouth.     ALPRAZolam  (XANAX ) 0.5 MG tablet Take 1 tablet (0.5 mg total) by mouth.     aluminum-magnesium  hydroxide-simethicone  (MAALOX MAX) 400-400-40 mg/5 mL suspension Take 5 mL by mouth.      ascorbic acid, vitamin C, (VITAMIN C) 1000 MG tablet Take 1 tablet (1,000 mg total) by mouth.     clobetasol-emollient 0.05 % topical foam APPLY TO THE AFFECTED AREA(S) BY TOPICAL ROUTE 2 TIMES PER DAY IN THE MORNING AND EVENING     cyanocobalamin , vitamin B-12, 1000 MCG tablet Take 2 tablets (2,000 mcg total) by mouth.     docusate sodium  (COLACE) 100  MG capsule Take 1 capsule (100 mg total) by mouth.     DULoxetine  (CYMBALTA ) 60 MG capsule Take 1 capsule (60 mg total) by mouth daily.     finasteride  (PROSCAR ) 5 mg tablet Take 1 tablet (5 mg total) by mouth daily.     fluticasone propionate (CUTIVATE) 0.05 % cream APPLY TO AFFECTED AREA OF FACE TWICE DAILY.     furosemide  (LASIX ) 20 MG tablet Take 1 tablet (20 mg total) by mouth daily.     gabapentin  (NEURONTIN ) 100 MG capsule Take 1 capsule (100 mg total) by mouth. 2 tablet at dinner and 3 tablets at bed time.     levothyroxine  (SYNTHROID ) 25 MCG tablet Take 1 tablet (25 mcg total) by mouth daily.     melatonin 3 mg Tab Take 1 tablet (3 mg total) by mouth.     pantoprazole  (PROTONIX ) 40 MG tablet Take 1 tablet (40 mg total) by mouth daily before breakfast.     polyethylene glycol (MIRALAX ) 17 gram packet Take 17 g by mouth.     potassium chloride  (KLOR-CON ) 10 MEQ CR tablet Take 1 tablet (10 mEq total) by mouth daily.     silodosin  (RAPAFLO ) 8 mg cap Take 1 capsule (8 mg total) by mouth 2 (two) times daily.     simvastatin  (ZOCOR ) 20 MG tablet Take 1 tablet (20 mg total) by mouth.     traMADol  (ULTRAM ) 50 mg tablet TAKE 1/2 TO 1 TABLET BY MOUTH 2 TIMES DAILY AS NEEDED.     zinc  oxide 12.8 % Oint Apply 1 Application topically.     No current facility-administered medications for this visit.

## 2024-05-11 NOTE — Progress Notes (Signed)
 Patient in clinic today ambulatory with use of cane for consultation of lung cancer, accompanied by spouse and son. Patient is alert and oriented. Height, weight and vitals obtained. Denies pain. No other concerns noted. MD aware.   Will schedule any tests or procedures and arrange any referrals as indicated by provider. MyChart active

## 2024-05-12 ENCOUNTER — Telehealth (HOSPITAL_COMMUNITY): Payer: Self-pay

## 2024-05-12 ENCOUNTER — Other Ambulatory Visit (HOSPITAL_COMMUNITY): Payer: Self-pay

## 2024-05-12 ENCOUNTER — Encounter (HOSPITAL_COMMUNITY): Payer: Self-pay

## 2024-05-12 ENCOUNTER — Emergency Department (HOSPITAL_COMMUNITY)

## 2024-05-12 ENCOUNTER — Inpatient Hospital Stay (HOSPITAL_COMMUNITY)
Admission: EM | Admit: 2024-05-12 | Discharge: 2024-05-14 | DRG: 291 | Disposition: A | Discharge status: Home Health | Attending: Family Medicine | Admitting: Family Medicine

## 2024-05-12 ENCOUNTER — Inpatient Hospital Stay: Admitting: Oncology

## 2024-05-12 ENCOUNTER — Other Ambulatory Visit: Payer: Self-pay

## 2024-05-12 DIAGNOSIS — I509 Heart failure, unspecified: Secondary | ICD-10-CM

## 2024-05-12 DIAGNOSIS — K627 Radiation proctitis: Secondary | ICD-10-CM | POA: Diagnosis present

## 2024-05-12 DIAGNOSIS — Z8546 Personal history of malignant neoplasm of prostate: Secondary | ICD-10-CM | POA: Diagnosis not present

## 2024-05-12 DIAGNOSIS — G629 Polyneuropathy, unspecified: Secondary | ICD-10-CM | POA: Diagnosis present

## 2024-05-12 DIAGNOSIS — Z880 Allergy status to penicillin: Secondary | ICD-10-CM | POA: Diagnosis not present

## 2024-05-12 DIAGNOSIS — Z6825 Body mass index (BMI) 25.0-25.9, adult: Secondary | ICD-10-CM

## 2024-05-12 DIAGNOSIS — H9190 Unspecified hearing loss, unspecified ear: Secondary | ICD-10-CM | POA: Diagnosis present

## 2024-05-12 DIAGNOSIS — R627 Adult failure to thrive: Secondary | ICD-10-CM | POA: Diagnosis present

## 2024-05-12 DIAGNOSIS — I110 Hypertensive heart disease with heart failure: Principal | ICD-10-CM

## 2024-05-12 DIAGNOSIS — Z79890 Hormone replacement therapy (postmenopausal): Secondary | ICD-10-CM

## 2024-05-12 DIAGNOSIS — C3412 Malignant neoplasm of upper lobe, left bronchus or lung: Secondary | ICD-10-CM | POA: Diagnosis present

## 2024-05-12 DIAGNOSIS — C4492 Squamous cell carcinoma of skin, unspecified: Secondary | ICD-10-CM

## 2024-05-12 DIAGNOSIS — J189 Pneumonia, unspecified organism: Secondary | ICD-10-CM | POA: Diagnosis present

## 2024-05-12 DIAGNOSIS — I350 Nonrheumatic aortic (valve) stenosis: Secondary | ICD-10-CM | POA: Diagnosis not present

## 2024-05-12 DIAGNOSIS — I080 Rheumatic disorders of both mitral and aortic valves: Secondary | ICD-10-CM | POA: Diagnosis present

## 2024-05-12 DIAGNOSIS — I352 Nonrheumatic aortic (valve) stenosis with insufficiency: Secondary | ICD-10-CM | POA: Diagnosis not present

## 2024-05-12 DIAGNOSIS — Z515 Encounter for palliative care: Secondary | ICD-10-CM

## 2024-05-12 DIAGNOSIS — F17201 Nicotine dependence, unspecified, in remission: Secondary | ICD-10-CM

## 2024-05-12 DIAGNOSIS — I447 Left bundle-branch block, unspecified: Secondary | ICD-10-CM | POA: Diagnosis present

## 2024-05-12 DIAGNOSIS — I5021 Acute systolic (congestive) heart failure: Secondary | ICD-10-CM | POA: Diagnosis present

## 2024-05-12 DIAGNOSIS — C61 Malignant neoplasm of prostate: Secondary | ICD-10-CM | POA: Diagnosis not present

## 2024-05-12 DIAGNOSIS — R0602 Shortness of breath: Secondary | ICD-10-CM

## 2024-05-12 DIAGNOSIS — J90 Pleural effusion, not elsewhere classified: Secondary | ICD-10-CM | POA: Diagnosis not present

## 2024-05-12 DIAGNOSIS — Z923 Personal history of irradiation: Secondary | ICD-10-CM

## 2024-05-12 DIAGNOSIS — Z8249 Family history of ischemic heart disease and other diseases of the circulatory system: Secondary | ICD-10-CM

## 2024-05-12 DIAGNOSIS — Y842 Radiological procedure and radiotherapy as the cause of abnormal reaction of the patient, or of later complication, without mention of misadventure at the time of the procedure: Secondary | ICD-10-CM | POA: Diagnosis present

## 2024-05-12 DIAGNOSIS — Z885 Allergy status to narcotic agent status: Secondary | ICD-10-CM

## 2024-05-12 DIAGNOSIS — E039 Hypothyroidism, unspecified: Secondary | ICD-10-CM | POA: Diagnosis not present

## 2024-05-12 DIAGNOSIS — Z79899 Other long term (current) drug therapy: Secondary | ICD-10-CM

## 2024-05-12 DIAGNOSIS — J9601 Acute respiratory failure with hypoxia: Secondary | ICD-10-CM | POA: Diagnosis present

## 2024-05-12 DIAGNOSIS — I429 Cardiomyopathy, unspecified: Secondary | ICD-10-CM | POA: Diagnosis present

## 2024-05-12 DIAGNOSIS — Z1152 Encounter for screening for COVID-19: Secondary | ICD-10-CM | POA: Diagnosis not present

## 2024-05-12 DIAGNOSIS — E782 Mixed hyperlipidemia: Secondary | ICD-10-CM | POA: Diagnosis present

## 2024-05-12 DIAGNOSIS — N400 Enlarged prostate without lower urinary tract symptoms: Secondary | ICD-10-CM | POA: Diagnosis present

## 2024-05-12 DIAGNOSIS — Z87442 Personal history of urinary calculi: Secondary | ICD-10-CM

## 2024-05-12 DIAGNOSIS — E890 Postprocedural hypothyroidism: Secondary | ICD-10-CM | POA: Diagnosis present

## 2024-05-12 DIAGNOSIS — R7989 Other specified abnormal findings of blood chemistry: Secondary | ICD-10-CM

## 2024-05-12 DIAGNOSIS — Z888 Allergy status to other drugs, medicaments and biological substances status: Secondary | ICD-10-CM

## 2024-05-12 DIAGNOSIS — Z87891 Personal history of nicotine dependence: Secondary | ICD-10-CM

## 2024-05-12 DIAGNOSIS — I739 Peripheral vascular disease, unspecified: Secondary | ICD-10-CM | POA: Diagnosis present

## 2024-05-12 DIAGNOSIS — Z8673 Personal history of transient ischemic attack (TIA), and cerebral infarction without residual deficits: Secondary | ICD-10-CM

## 2024-05-12 DIAGNOSIS — I10 Essential (primary) hypertension: Secondary | ICD-10-CM | POA: Diagnosis present

## 2024-05-12 DIAGNOSIS — Z8052 Family history of malignant neoplasm of bladder: Secondary | ICD-10-CM

## 2024-05-12 DIAGNOSIS — K219 Gastro-esophageal reflux disease without esophagitis: Secondary | ICD-10-CM | POA: Diagnosis present

## 2024-05-12 DIAGNOSIS — I5020 Unspecified systolic (congestive) heart failure: Secondary | ICD-10-CM

## 2024-05-12 LAB — CBC WITH DIFFERENTIAL/PLATELET
Abs Immature Granulocytes: 0.1 K/uL — ABNORMAL HIGH (ref 0.00–0.07)
Basophils Absolute: 0.1 K/uL (ref 0.0–0.1)
Basophils Relative: 0 %
Eosinophils Absolute: 0.1 K/uL (ref 0.0–0.5)
Eosinophils Relative: 0 %
HCT: 43 % (ref 39.0–52.0)
Hemoglobin: 13.5 g/dL (ref 13.0–17.0)
Immature Granulocytes: 1 %
Lymphocytes Relative: 3 %
Lymphs Abs: 0.4 K/uL — ABNORMAL LOW (ref 0.7–4.0)
MCH: 27.8 pg (ref 26.0–34.0)
MCHC: 31.4 g/dL (ref 30.0–36.0)
MCV: 88.7 fL (ref 80.0–100.0)
Monocytes Absolute: 1 K/uL (ref 0.1–1.0)
Monocytes Relative: 6 %
Neutro Abs: 14.4 K/uL — ABNORMAL HIGH (ref 1.7–7.7)
Neutrophils Relative %: 90 %
Platelets: 523 K/uL — ABNORMAL HIGH (ref 150–400)
RBC: 4.85 MIL/uL (ref 4.22–5.81)
RDW: 13.7 % (ref 11.5–15.5)
WBC: 16 K/uL — ABNORMAL HIGH (ref 4.0–10.5)
nRBC: 0 % (ref 0.0–0.2)

## 2024-05-12 LAB — RESP PANEL BY RT-PCR (RSV, FLU A&B, COVID)  RVPGX2
Influenza A by PCR: NEGATIVE
Influenza B by PCR: NEGATIVE
Resp Syncytial Virus by PCR: NEGATIVE
SARS Coronavirus 2 by RT PCR: NEGATIVE

## 2024-05-12 LAB — BASIC METABOLIC PANEL WITH GFR
Anion gap: 9 (ref 5–15)
BUN: 20 mg/dL (ref 8–23)
CO2: 30 mmol/L (ref 22–32)
Calcium: 9.1 mg/dL (ref 8.9–10.3)
Chloride: 100 mmol/L (ref 98–111)
Creatinine, Ser: 1.11 mg/dL (ref 0.61–1.24)
GFR, Estimated: >60 mL/min (ref >60)
Glucose, Bld: 202 mg/dL — ABNORMAL HIGH (ref 70–99)
Potassium: 4.7 mmol/L (ref 3.5–5.1)
Sodium: 139 mmol/L (ref 135–145)

## 2024-05-12 LAB — MRSA NEXT GEN BY PCR, NASAL: MRSA by PCR Next Gen: NOT DETECTED

## 2024-05-12 LAB — LACTIC ACID, PLASMA: Lactic Acid, Venous: 1.4 mmol/L (ref 0.5–1.9)

## 2024-05-12 LAB — TROPONIN T, HIGH SENSITIVITY
Troponin T High Sensitivity: 246 ng/L (ref 0–19)
Troponin T High Sensitivity: 223 ng/L (ref 0–19)

## 2024-05-12 LAB — PRO BRAIN NATRIURETIC PEPTIDE: Pro Brain Natriuretic Peptide: 5322 pg/mL — ABNORMAL HIGH (ref <300.0)

## 2024-05-12 MED ORDER — SODIUM CHLORIDE 0.9% FLUSH: 3.0000 mL | INTRAVENOUS | Status: DC | PRN

## 2024-05-12 MED ORDER — ENOXAPARIN SODIUM 40 MG/0.4ML IJ SOSY: 40.0000 mg | PREFILLED_SYRINGE | INTRAMUSCULAR | Status: DC

## 2024-05-12 MED ORDER — IPRATROPIUM-ALBUTEROL 0.5-2.5 (3) MG/3ML IN SOLN
3.0000 mL | Freq: Three times a day (TID) | RESPIRATORY_TRACT | Status: DC
  Administered 2024-05-13: 3 mL via RESPIRATORY_TRACT
  Filled 2024-05-12: qty 3

## 2024-05-12 MED ORDER — SODIUM CHLORIDE 0.9% FLUSH
3.0000 mL | Freq: Two times a day (BID) | INTRAVENOUS | Status: DC
  Administered 2024-05-12 – 2024-05-14 (×5): 3 mL via INTRAVENOUS

## 2024-05-12 MED ORDER — DULOXETINE HCL 60 MG PO CPEP
60.0000 mg | ORAL_CAPSULE | Freq: Every evening | ORAL | Status: DC
  Administered 2024-05-12 – 2024-05-13 (×2): 60 mg via ORAL
  Filled 2024-05-12 (×2): qty 1

## 2024-05-12 MED ORDER — IPRATROPIUM-ALBUTEROL 0.5-2.5 (3) MG/3ML IN SOLN
3.0000 mL | Freq: Once | RESPIRATORY_TRACT | Status: AC
  Administered 2024-05-12: 06:00:00 3 mL via RESPIRATORY_TRACT
  Filled 2024-05-12: qty 3

## 2024-05-12 MED ORDER — IOHEXOL 350 MG/ML SOLN
80.0000 mL | Freq: Once | INTRAVENOUS | Status: AC | PRN
  Administered 2024-05-12: 07:00:00 75 mL via INTRAVENOUS

## 2024-05-12 MED ORDER — LOSARTAN POTASSIUM 25 MG PO TABS: 12.5000 mg | ORAL_TABLET | Freq: Every day | ORAL | Status: DC

## 2024-05-12 MED ORDER — FUROSEMIDE 10 MG/ML IJ SOLN
40.0000 mg | Freq: Once | INTRAMUSCULAR | Status: AC
  Administered 2024-05-12: 07:00:00 40 mg via INTRAVENOUS
  Filled 2024-05-12 (×2): qty 4

## 2024-05-12 MED ORDER — SODIUM CHLORIDE 0.9 % IV SOLN: 250.0000 mL | INTRAVENOUS | Status: AC | PRN

## 2024-05-12 MED ORDER — SODIUM CHLORIDE 0.9 % IV SOLN
2.0000 g | INTRAVENOUS | Status: DC
  Administered 2024-05-13 – 2024-05-14 (×2): 2 g via INTRAVENOUS
  Filled 2024-05-12 (×2): qty 20

## 2024-05-12 MED ORDER — LEVOTHYROXINE SODIUM 25 MCG PO TABS
25.0000 ug | ORAL_TABLET | Freq: Every day | ORAL | Status: DC
  Administered 2024-05-13 – 2024-05-14 (×2): 25 ug via ORAL
  Filled 2024-05-12 (×2): qty 1

## 2024-05-12 MED ORDER — CHLORHEXIDINE GLUCONATE CLOTH 2 % EX PADS
6.0000 | MEDICATED_PAD | Freq: Every day | CUTANEOUS | Status: DC
  Administered 2024-05-13 – 2024-05-14 (×2): 6 via TOPICAL

## 2024-05-12 MED ORDER — FENTANYL CITRATE (PF) 50 MCG/ML IJ SOSY
12.5000 ug | PREFILLED_SYRINGE | INTRAMUSCULAR | Status: DC | PRN
  Administered 2024-05-12: 15:00:00 12.5 ug via INTRAVENOUS
  Filled 2024-05-12: qty 1

## 2024-05-12 MED ORDER — ACETAMINOPHEN 325 MG PO TABS
650.0000 mg | ORAL_TABLET | ORAL | Status: DC | PRN
  Administered 2024-05-12 – 2024-05-13 (×2): 650 mg via ORAL
  Filled 2024-05-12 (×2): qty 2

## 2024-05-12 MED ORDER — IPRATROPIUM-ALBUTEROL 0.5-2.5 (3) MG/3ML IN SOLN
3.0000 mL | Freq: Four times a day (QID) | RESPIRATORY_TRACT | Status: DC
  Administered 2024-05-12 (×2): 3 mL via RESPIRATORY_TRACT
  Filled 2024-05-12 (×2): qty 3

## 2024-05-12 MED ORDER — FUROSEMIDE 10 MG/ML IJ SOLN
40.0000 mg | Freq: Two times a day (BID) | INTRAMUSCULAR | Status: DC
  Administered 2024-05-12: 18:00:00 40 mg via INTRAVENOUS
  Filled 2024-05-12: qty 4

## 2024-05-12 MED ORDER — ONDANSETRON HCL 4 MG/2ML IJ SOLN
INTRAMUSCULAR | Status: AC
  Filled 2024-05-12: qty 2

## 2024-05-12 MED ORDER — SIMVASTATIN 20 MG PO TABS
20.0000 mg | ORAL_TABLET | Freq: Every evening | ORAL | Status: DC
  Administered 2024-05-12 – 2024-05-13 (×2): 20 mg via ORAL
  Filled 2024-05-12 (×2): qty 1

## 2024-05-12 MED ORDER — POLYETHYL GLYCOL-PROPYL GLYCOL 0.4-0.3 % OP GEL: Freq: Two times a day (BID) | OPHTHALMIC | Status: DC | PRN

## 2024-05-12 MED ORDER — PANTOPRAZOLE SODIUM 40 MG PO TBEC
40.0000 mg | DELAYED_RELEASE_TABLET | Freq: Every day | ORAL | Status: DC
  Administered 2024-05-13 – 2024-05-14 (×2): 40 mg via ORAL
  Filled 2024-05-12 (×2): qty 1

## 2024-05-12 MED ORDER — OXYCODONE HCL 5 MG PO TABS
5.0000 mg | ORAL_TABLET | Freq: Four times a day (QID) | ORAL | Status: DC | PRN
  Administered 2024-05-13 (×3): 5 mg via ORAL
  Filled 2024-05-12 (×3): qty 1

## 2024-05-12 MED ORDER — PROCHLORPERAZINE EDISYLATE 10 MG/2ML IJ SOLN: 10.0000 mg | INTRAMUSCULAR | Status: DC | PRN

## 2024-05-12 MED ORDER — FINASTERIDE 5 MG PO TABS
5.0000 mg | ORAL_TABLET | Freq: Every day | ORAL | Status: DC
  Administered 2024-05-13 – 2024-05-14 (×2): 5 mg via ORAL
  Filled 2024-05-12 (×2): qty 1

## 2024-05-12 MED ORDER — POLYVINYL ALCOHOL 1.4 % OP SOLN: 1.0000 [drp] | Freq: Two times a day (BID) | OPHTHALMIC | Status: DC | PRN

## 2024-05-12 MED ORDER — SODIUM CHLORIDE 0.9 % IV SOLN
1.0000 g | Freq: Once | INTRAVENOUS | Status: AC
  Administered 2024-05-12: 07:00:00 1 g via INTRAVENOUS
  Filled 2024-05-12: qty 10

## 2024-05-12 MED ORDER — GABAPENTIN 100 MG PO CAPS
100.0000 mg | ORAL_CAPSULE | Freq: Every day | ORAL | Status: DC
  Administered 2024-05-12 – 2024-05-13 (×2): 100 mg via ORAL
  Filled 2024-05-12 (×2): qty 1

## 2024-05-12 MED ORDER — ASPIRIN 81 MG PO CHEW
324.0000 mg | CHEWABLE_TABLET | Freq: Once | ORAL | Status: AC
  Administered 2024-05-12: 07:00:00 324 mg via ORAL
  Filled 2024-05-12 (×2): qty 4

## 2024-05-12 MED ORDER — TAMSULOSIN HCL 0.4 MG PO CAPS
0.4000 mg | ORAL_CAPSULE | Freq: Every day | ORAL | Status: DC
  Administered 2024-05-12 – 2024-05-13 (×2): 0.4 mg via ORAL
  Filled 2024-05-12 (×2): qty 1

## 2024-05-12 MED ORDER — PROCHLORPERAZINE EDISYLATE 10 MG/2ML IJ SOLN
INTRAMUSCULAR | Status: AC
  Administered 2024-05-12: 13:00:00 10 mg via INTRAVENOUS
  Filled 2024-05-12: qty 2

## 2024-05-12 MED ORDER — SACUBITRIL-VALSARTAN 24-26 MG PO TABS
1.0000 | ORAL_TABLET | Freq: Two times a day (BID) | ORAL | Status: DC
  Administered 2024-05-12 (×2): 1 via ORAL
  Filled 2024-05-12 (×2): qty 1

## 2024-05-12 MED ORDER — ALPRAZOLAM 0.5 MG PO TABS
0.5000 mg | ORAL_TABLET | Freq: Every day | ORAL | Status: DC
  Administered 2024-05-12 – 2024-05-13 (×2): 0.5 mg via ORAL
  Filled 2024-05-12 (×2): qty 1

## 2024-05-12 MED ORDER — ENOXAPARIN SODIUM 40 MG/0.4ML IJ SOSY
40.0000 mg | PREFILLED_SYRINGE | INTRAMUSCULAR | Status: DC
  Administered 2024-05-12 – 2024-05-13 (×2): 40 mg via SUBCUTANEOUS
  Filled 2024-05-12 (×2): qty 0.4

## 2024-05-12 MED ORDER — DOXYCYCLINE HYCLATE 100 MG PO TABS
100.0000 mg | ORAL_TABLET | Freq: Two times a day (BID) | ORAL | Status: DC
  Administered 2024-05-12 – 2024-05-14 (×5): 100 mg via ORAL
  Filled 2024-05-12 (×5): qty 1

## 2024-05-12 MED ORDER — SPIRONOLACTONE 12.5 MG HALF TABLET
12.5000 mg | ORAL_TABLET | Freq: Every day | ORAL | Status: DC
  Administered 2024-05-12 – 2024-05-14 (×3): 12.5 mg via ORAL
  Filled 2024-05-12 (×3): qty 1

## 2024-05-12 NOTE — Progress Notes (Signed)
 PT Cancellation Note  Patient Details Name: Calvin Moreno MRN: [id number] DOB: 1940/09/12   Cancelled Treatment:    Reason Eval/Treat Not Completed: Fatigue/lethargy limiting ability to participate Attempted co-evaluation with OT. Pt reporting increased fatigue/tiredness. Family requesting to hold evaluations until next day. Will attempt at later time/date.  4:24 PM, 05/12/2024 Rosaria Settler, PT, DPT [place] with [facility]

## 2024-05-12 NOTE — ED Triage Notes (Signed)
 Patient BIB [facility] from home for complaint of Shortness of breath that started this morning when he woke up. O2 sat with EMS 95% on room air. EMS placed patient on 3L via nasal cannula due to patient had increased work of breathing. EMS Reported Sinus tach 120-125 on their cardiac monitor. Patient has History of Lung cancer without current treatment.

## 2024-05-12 NOTE — Telephone Encounter (Signed)
 Pharmacy Patient Advocate Encounter  Insurance verification completed.    The patient is insured through [employer]. Patient has Medicare and is not eligible for a copay card, but may be able to apply for patient assistance or Medicare RX Payment Plan (Patient Must reach out to their plan, if eligible for payment plan), if available.    Ran test claim for Entresto  24-26mg  and the current 30 day co-pay is $24.25.   This test claim was processed through [facility]- copay amounts may vary at other pharmacies due to pharmacy/plan contracts, or as the patient moves through the different stages of their insurance plan.

## 2024-05-12 NOTE — Consult Note (Signed)
 [facility] Consultation Hematology/Oncology  CONSULTING PHYSICIAN: Dr. Vicci  REASON FOR CONSULT: Lung cancer    HISTORY OF PRESENT ILLNESS:   Calvin Moreno is a 83 y.o. 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  Patient had abnormal CT scan on 03/31/2023, followed by PET scan and bronchoscopy on 05/14/2023 which showed squamous cell carcinoma positive for p40 and negative for TTF-1.  He also has history of prostate cancer with initial diagnosis in 2012 status post radiation therapy.  He has had normal PSA since.  Patient was seen last month by oncology for an increasing lung mass from previous SBRT along with some lymph node involvement.  PET scan was recommended which showed enlarging left upper lobe mass demonstrating intense hypermetabolic activity consistent with local recurrence of lung cancer.  No evidence of metastatic disease.  Oncology discussed with patient and family referral back to Dr. Dannielle [facility] and radiation oncology for possible additional SBRT.  Patient was not interested in biopsy or chemotherapy.  Patient wanted a second opinion at [facility] whom he saw yesterday.  He is apparently not a candidate for any additional radiation at this point.  There are no actionable mutations as of today.  PD-L1 was less than 1%.  Plan was to wait for additional biomarkers to determine next steps.  Dr. Dannielle discussed obtaining liquid biopsy at this time to GI.   MEDICATIONS: I have reviewed the patient's current medications.      PERFORMANCE STATUS: The patient's performance status is 1 - Symptomatic but completely ambulatory  PHYSICAL EXAM: Most Recent Vital Signs: Blood pressure (!) 134/103, pulse 92, temperature 97.8 F (36.6 C), temperature source Oral, resp. rate 12, height 6' (1.829 m), weight 270 lb (122.5 kg), SpO2 91%.  GENERAL:alert, no distress and comfortable SKIN: skin color, texture, turgor are normal, no  rashes or significant lesions EYES: normal, conjunctiva are pink and non-injected, sclera clear OROPHARYNX:no exudate, no erythema and lips, buccal mucosa, and tongue normal  NECK: supple, thyroid  normal size, non-tender, without nodularity LYMPH:  no palpable lymphadenopathy in the cervical, axillary or inguinal LUNGS: clear to auscultation and percussion with normal breathing effort HEART: regular rate & rhythm and no murmurs and no lower extremity edema ABDOMEN:abdomen soft, non-tender and normal bowel sounds Musculoskeletal:no cyanosis of digits and no clubbing  PSYCH: alert & oriented x 3 with fluent speech NEURO: no focal motor/sensory deficits    LABORATORY DATA:   Last CBC Lab Results  Component Value Date   WBC 16.0 (H) 05/12/2024   HGB 13.5 05/12/2024   HCT 43.0 05/12/2024   MCV 88.7 05/12/2024   MCH 27.8 05/12/2024   RDW 13.7 05/12/2024   PLT 523 (H) 05/12/2024     Last metabolic panel Lab Results  Component Value Date   GLUCOSE 202 (H) 05/12/2024   NA 139 05/12/2024   K 4.7 05/12/2024   CL 100 05/12/2024   CO2 30 05/12/2024   BUN 20 05/12/2024   CREATININE 1.11 05/12/2024   GFRNONAA >60 05/12/2024   CALCIUM 9.1 05/12/2024   PHOS 3.1 03/31/2023   PROT 7.9 04/06/2024   ALBUMIN 4.1 04/06/2024   BILITOT 0.6 04/06/2024   ALKPHOS 137 (H) 04/06/2024   AST 20 04/06/2024   ALT 10 04/06/2024   ANIONGAP 9 05/12/2024      RADIOGRAPHY: CT Angio Chest Pulmonary Embolism (PE) W or WO Contrast EXAM: CTA CHEST 05/12/2024 07:21:54 AM  TECHNIQUE: CTA of  the chest was performed after the administration of intravenous contrast. Multiplanar reformatted images are provided for review. MIP images are provided for review. Automated exposure control, iterative reconstruction, and/or weight based adjustment of the mA/kV was utilized to reduce the radiation dose to as low as reasonably achievable.  COMPARISON: CT of the chest dated 04/06/2024.  CLINICAL  HISTORY: sob  FINDINGS:  PULMONARY ARTERIES: Pulmonary arteries are adequately opacified for evaluation. No acute pulmonary embolus. The right main pulmonary artery is dilated, measuring approximately 3.0 cm in diameter.  MEDIASTINUM: The heart and pericardium demonstrate no acute abnormality. There is calcific coronary artery disease present. There is also calcification of the mitral annulus. The thoracic aorta is normal in caliber, but demonstrates mild-to-moderate calcific atheromatous disease.  LYMPH NODES: No mediastinal, hilar or axillary lymphadenopathy.  LUNGS AND PLEURA: There has been significant interval worsening of opacification/consolidation of the left upper lobe, enveloping the ovoid mass noted within the left upper lobe on the previous study. There is also patchy consolidation of the right upper lobe and superior segment of the right lower lobe. There is also patchy opacification/consolidation of the superior segment of the left lower lobe. Since the previous study, the patient has developed moderate bilateral pleural effusions. No pneumothorax.  UPPER ABDOMEN: Limited images of the upper abdomen are unremarkable.  SOFT TISSUES AND BONES: No acute bone or soft tissue abnormality.  IMPRESSION: 1. No evidence of pulmonary embolus. 2. Significant interval worsening of opacification/consolidation of the left upper lobe, enveloping the ovoid mass noted within the left upper lobe on the previous study. 3. Patchy consolidation of the right upper lobe and superior segment of the right lower lobe. 4. Patchy opacification/consolidation of the superior segment of the left lower lobe. 5. Moderate bilateral pleural effusions, new since the previous study. 6. Dilated right main pulmonary artery, measuring approximately 3.0 cm in diameter, which can be seen with pulmonary arterial hypertension. 7. Calcific coronary artery disease and mitral annular  calcification.  Electronically signed by: Evalene Coho MD 05/12/2024 07:50 AM EST RP Workstation: [id number] DG Chest 1 View EXAM: 1 VIEW(S) XRAY OF THE CHEST 05/12/2024 05:09:00 AM  COMPARISON: PET CT 04/28/2024  CLINICAL HISTORY: SOB  FINDINGS:  LUNGS AND PLEURA: Right mid lung perihilar opacity is new from the previous exam. Left upper lobe mass. Moderate diffuse interstitial edema. Small right pleural effusion. No pneumothorax.  HEART AND MEDIASTINUM: Aortic calcification.  BONES AND SOFT TISSUES: No acute osseous abnormality.  IMPRESSION: 1. Moderate diffuse interstitial edema. 2. Small right pleural effusion. 3. New right mid lung perihilar opacity, compatible with pneumonia . 4. Left upper lobe mass, as described on the comparison PET CT from 04/28/2024 .  Electronically signed by: Waddell Calk MD 05/12/2024 06:02 AM EST RP Workstation: [id number]        ASSESSMENT: Patient is a 83 year old male with locally advanced lung cancer who was recently admitted to the hospital for shortness of breath.  PLAN:  Assessment & Plan Squamous cell carcinoma of upper lobe of left lung (HCC)  Prostate cancer (HCC)  Bilateral pleural effusion      Thank you for involving us  in this patient's care.  Please to reach out with any questions or concerns.  Delon Hope, AGNP-C Department of Hematology/Oncology [facility]  Phone: [phone]  05/12/2024 12:34 PM

## 2024-05-12 NOTE — Hospital Course (Addendum)
 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  He does not use oxygen at home.  He he had an echocardiogram done on 05/03/2024 with findings of LVEF 25 to 30% down from 50 to 55% with severe AS and regional wall motion abnormalities.  Family says they have been trying to get in to see his cardiologist but has been having a difficult time getting an appointment.  Patient has had some increasing peripheral edema and weight gain.  He denies chest pain.  His main complaint has been shortness of breath.  He denies fever and chills.  He has had increasing cough and chest congestion.  He had a recent reported viral illness that lasted for 1/2-day and then resolved.  He saw [facility] oncology 05/11/2024 and they have explained to the patient that his lung cancer has progressed locally and any therapy would be palliative and not curative.  Patient was supposed to see Dr. Davonna at the [facility] later today for second opinion.  He presented to the ED due to progressive shortness of breath.  His ED work up significant for acute heart failure with bilateral pleural effusions, right upper lobe and left lower lobe consolidation likely pneumonia, negative for pulmonary embolus, new oxygen requirement at 2 L/min, tachypnea and tachycardia.  Admission was requested for management of acute heart failure and acute respiratory failure with hypoxia with new oxygen requirements.

## 2024-05-12 NOTE — ED Notes (Signed)
 Pt assisted back to the bed after using the bedside commode. Pt is resting in the bed at this time with call light in reach.

## 2024-05-12 NOTE — TOC Initial Note (Signed)
 Transition of Care [facility]) - Initial/Assessment Note    Patient Details  Name: Calvin Moreno MRN: [id number] Date of Birth: 22-Jan-1941  [facility] CM/SW Contact:    Lucie Lunger, LCSWA Phone Number: 05/12/2024, 2:33 PM  Clinical Narrative:                 CSW met with pt and family at bedside to complete assessment. Pt is from home with spouse. Pt is independent in completing his ADLs and able to drive when needed. Pt has had HH in the past. Pt has all needed DME in the home. TOC to follow.   Expected Discharge Plan: Home/Self Care Barriers to Discharge: Continued Medical Work up   Patient Goals and CMS Choice Patient states their goals for this hospitalization and ongoing recovery are:: return home CMS Medicare.gov Compare Post Acute Care list provided to:: Patient Choice offered to / list presented to : Patient, Spouse      Expected Discharge Plan and Services In-house Referral: Clinical Social Work Discharge Planning Services: CM Consult   Living arrangements for the past 2 months: Single Family Home                                      Prior Living Arrangements/Services Living arrangements for the past 2 months: Single Family Home Lives with:: Spouse Patient language and need for interpreter reviewed:: Yes Do you feel safe going back to the place where you live?: Yes      Need for Family Participation in Patient Care: Yes (Comment) Care giver support system in place?: Yes (comment) Current home services: DME Criminal Activity/Legal Involvement Pertinent to Current Situation/Hospitalization: No - Comment as needed  Activities of Daily Living   ADL Screening (condition at time of admission) Independently performs ADLs?: Yes (appropriate for developmental age) Is the patient deaf or have difficulty hearing?: Yes Does the patient have difficulty seeing, even when wearing glasses/contacts?: No Does the patient have difficulty concentrating,  remembering, or making decisions?: Yes  Permission Sought/Granted                  Emotional Assessment Appearance:: Appears stated age Attitude/Demeanor/Rapport: Engaged Affect (typically observed): Accepting Orientation: : Oriented to Self, Oriented to Place, Oriented to  Time, Oriented to Situation Alcohol  / Substance Use: Not Applicable Psych Involvement: No (comment)  Admission diagnosis:  Acute heart failure (HCC) [I50.9] SOB (shortness of breath) [R06.02] Elevated troponin [R79.89] Pneumonia due to infectious organism, unspecified laterality, unspecified part of lung [J18.9] Acute congestive heart failure, unspecified heart failure type (HCC) [I50.9] Systolic congestive heart failure, unspecified HF chronicity (HCC) [I50.20] Patient Active Problem List   Diagnosis Date Noted   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) 05/12/2024   Prostate cancer (HCC) 05/12/2024   Bilateral pleural effusion 05/12/2024   Squamous cell carcinoma of upper lobe of left lung (HCC) 05/31/2023   PAD (peripheral artery disease) 04/19/2023   Lung nodule 04/14/2023   Rectal ulcer 04/02/2023   Lumbar burst fracture (HCC) 03/30/2023   Fall at home, initial encounter 03/30/2023   Acquired hypothyroidism 03/30/2023   GI bleed 03/30/2023   Hypokalemia 12/26/2022   GERD (gastroesophageal reflux disease) 12/26/2022   Prolonged QT interval 12/26/2022   BPH (benign prostatic hyperplasia) 10/09/2020   Weak urinary stream 10/09/2020   Urinary frequency 10/09/2020   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   Rectal bleed 11/06/2011  Bowel habit changes 11/06/2011   Severe aortic stenosis    Mixed hyperlipidemia    Left bundle branch block    Tobacco abuse, in remission    Metabolic syndrome    Essential hypertension    OBESITY 07/02/2010   Calculus, renal 07/02/2010   INSOMNIA 07/02/2010   PCP:  Shona Norleen PEDLAR, MD Pharmacy:   [facility] [place], [place] - [address] [place] [id number] Phone: [phone] Fax: [phone]     Social Drivers of Health (SDOH) Social History: SDOH Screenings   Food Insecurity: No Food Insecurity (05/12/2024)  Housing: Low Risk (05/12/2024)  Transportation Needs: No Transportation Needs (05/12/2024)  Utilities: Not At Risk (05/12/2024)  Depression (PHQ2-9): Low Risk (04/13/2024)  Financial Resource Strain: Low Risk (05/11/2024)   Received from [facility]  Physical Activity: Inactive (05/11/2024)   Received from [facility]  Social Connections: Socially Integrated (05/12/2024)  Stress: No Stress Concern Present (05/11/2024)   Received from [facility]  Tobacco Use: Medium Risk (05/12/2024)  Health Literacy: Medium Risk (05/11/2024)   Received from [facility]   SDOH Interventions:     Readmission Risk Interventions    03/31/2023   11:44 AM  Readmission Risk Prevention Plan  Transportation Screening Complete  Home Care Screening Complete  Medication Review (RN CM) Complete

## 2024-05-12 NOTE — ED Notes (Signed)
 This nurse called to patient room patient informed nurse he could not breath, oxygen level at 94% 2L o2 via nasal cannula applied Oxygen come up to 97%.  Increased work of breath noted initially and decreased after oxygen placed on patient.

## 2024-05-12 NOTE — ED Notes (Signed)
 Patient asking for drink at this time. Cleared by EDP . Patient given cranberry juice. RN Warren aware

## 2024-05-12 NOTE — Progress Notes (Signed)
 OT Cancellation Note  Patient Details Name: Calvin Moreno MRN: [id number] DOB: [birth year]   Cancelled Treatment:    Reason Eval/Treat Not Completed: Fatigue/lethargy limiting ability to participate. Patient/family declined due to pt just getting to the hospital and being fatigued. This OT explained the reason for the evaluation to determine pt's needs and next venue of care. Pt requested to hold off evaluation till tomorrow. Will attempt evaluation later as time permits.  Kayon Dozier OT, MOT   Jayson Person 05/12/2024, 3:48 PM

## 2024-05-12 NOTE — ED Provider Notes (Signed)
 [place] EMERGENCY DEPARTMENT AT [facility] Provider Note   CSN: [id number] Arrival date & time: 05/12/24  9560     Patient presents with: Shortness of Breath   Calvin Moreno is a 83 y.o. male.   Abnormal rhythm to evaluation of the neck that he is a very pleasant [has a history of lung cancer not being treated for it currently.  He states that he has shortness of breath that, but got worse yesterday.  Per report EMS and on 3 L nasal cannula he was somewhat tachypneic.  He admits to increased cough but has not had any fevers.  Patient is not on oxygen at home.  Denies any other symptoms or concerns.   Shortness of Breath Associated symptoms: cough   Associated symptoms: no abdominal pain, no chest pain, no ear pain, no fever, no rash, no sore throat and no vomiting        Prior to Admission medications  Medication Sig Start Date End Date Taking? Authorizing Provider  acyclovir (ZOVIRAX) 800 MG tablet Take 800 mg by mouth every 8 (eight) hours as needed. And give one tablet by mouth once daily.    [provider]  ALPRAZolam  (XANAX ) 0.5 MG tablet Take 1 tablet (0.5 mg total) by mouth at bedtime. 05/04/23   Abdul Fine, MD  alum & mag hydroxide-simeth (MAALOX PLUS) 400-400-40 MG/5ML suspension Take 5 mLs by mouth every 4 (four) hours as needed for indigestion.    [provider]  Ascorbic Acid (VITAMIN C) 1000 MG tablet Take 1,000 mg by mouth daily.    [provider]  docusate sodium  (COLACE) 100 MG capsule Take 1 capsule (100 mg total) by mouth 2 (two) times daily. 04/02/23   Ricky Fines, MD  DULoxetine  (CYMBALTA ) 60 MG capsule Take 60 mg by mouth daily. 04/11/24   [provider]  finasteride  (PROSCAR ) 5 MG tablet Take 1 tablet (5 mg total) by mouth daily. 09/23/23   McKenzie, Belvie CROME, MD  fluticasone (CUTIVATE) 0.05 % cream SMARTSIG:Topical 1-2 Times Daily PRN 12/10/22   [provider]  furosemide  (LASIX ) 20 MG  tablet Take 20 mg by mouth daily.    [provider]  gabapentin  (NEURONTIN ) 100 MG capsule Take 1 capsule (100 mg total) by mouth at bedtime. 10/05/23   Rogers Hai, MD  levothyroxine  (SYNTHROID ) 25 MCG tablet Take 25 mcg by mouth daily. 10/24/22   [provider]  Melatonin 3 MG TABS Take 3 mg by mouth at bedtime. For sleep    [provider]  pantoprazole  (PROTONIX ) 40 MG tablet Take 1 tablet (40 mg total) by mouth daily before breakfast. 03/03/23   Rourk, Lamar HERO, MD  Polyethyl Glycol-Propyl Glycol (SYSTANE OP) Place 1 drop into both eyes 2 (two) times daily as needed (dry eyes).    [provider]  polyethylene glycol (MIRALAX  / GLYCOLAX ) 17 g packet Take 17 g by mouth daily. Can be spread to every other day in the setting of diarrhea. 04/02/23   Ricky Fines, MD  potassium chloride  (KLOR-CON ) 10 MEQ tablet Take 10 mEq by mouth daily.    [provider]  psyllium (HYDROCIL/METAMUCIL) 95 % PACK Take 1 packet by mouth 2 (two) times daily. 04/02/23   Ricky Fines, MD  silodosin  (RAPAFLO ) 8 MG CAPS capsule Take 1 capsule (8 mg total) by mouth 2 (two) times daily. 09/23/23   McKenzie, Belvie CROME, MD  simvastatin  (ZOCOR ) 20 MG tablet Take 20 mg by mouth every evening.  [provider]  traMADol  (ULTRAM ) 50 MG tablet Take 25-50 mg by mouth 2 (two) times daily as needed. 05/28/23   [provider]  vitamin B-12 (CYANOCOBALAMIN ) 1000 MCG tablet Take 2,000 mcg by mouth daily.    [provider]    Allergies: Norvasc  [amlodipine  besylate], Codeine, and Amoxicillin    Review of Systems  Constitutional:  Negative for chills and fever.  HENT:  Negative for ear pain and sore throat.   Eyes:  Negative for pain and visual disturbance.  Respiratory:  Positive for cough and shortness of breath.   Cardiovascular:  Negative for chest pain and palpitations.  Gastrointestinal:  Negative for abdominal pain and vomiting.   Genitourinary:  Negative for dysuria and hematuria.  Musculoskeletal:  Negative for arthralgias and back pain.  Skin:  Negative for color change and rash.  Neurological:  Negative for seizures and syncope.  All other systems reviewed and are negative.   Updated Vital Signs BP 118/71   Pulse (!) 119   Temp 98 F (36.7 C) (Oral)   Resp 18   Ht 5' 5 (1.651 m)   Wt 72.6 kg   SpO2 98%   BMI 26.63 kg/m   Physical Exam Vitals and nursing note reviewed.  Constitutional:      General: He is not in acute distress.    Appearance: He is well-developed.  HENT:     Head: Normocephalic and atraumatic.  Eyes:     Conjunctiva/sclera: Conjunctivae normal.  Cardiovascular:     Rate and Rhythm: Normal rate and regular rhythm.     Heart sounds: No murmur heard. Pulmonary:     Effort: Tachypnea present. No respiratory distress.     Breath sounds: Decreased breath sounds and rhonchi present.  Abdominal:     Palpations: Abdomen is soft.     Tenderness: There is no abdominal tenderness.  Musculoskeletal:        General: No swelling.     Cervical back: Neck supple.  Skin:    General: Skin is warm and dry.     Capillary Refill: Capillary refill takes less than 2 seconds.  Neurological:     Mental Status: He is alert.  Psychiatric:        Mood and Affect: Mood normal.     (all labs ordered are listed, but only abnormal results are displayed) Labs Reviewed  BASIC METABOLIC PANEL WITH GFR - Abnormal; Notable for the following components:      Result Value   Glucose, Bld 202 (*)    All other components within normal limits  PRO BRAIN NATRIURETIC PEPTIDE - Abnormal; Notable for the following components:   Pro Brain Natriuretic Peptide 5,322.0 (*)    All other components within normal limits  CBC WITH DIFFERENTIAL/PLATELET - Abnormal; Notable for the following components:   WBC 16.0 (*)    Platelets 523 (*)    Neutro Abs 14.4 (*)    Lymphs Abs 0.4 (*)    Abs Immature Granulocytes  0.10 (*)    All other components within normal limits  TROPONIN T, HIGH SENSITIVITY - Abnormal; Notable for the following components:   Troponin T High Sensitivity 246 (*)    All other components within normal limits  RESP PANEL BY RT-PCR (RSV, FLU A&B, COVID)  RVPGX2  CULTURE, BLOOD (ROUTINE X 2)  CULTURE, BLOOD (ROUTINE X 2)  LACTIC ACID, PLASMA  LACTIC ACID, PLASMA  TROPONIN T, HIGH SENSITIVITY    EKG: EKG Interpretation Date/Time:  Thursday May 12 2024 04:42:13 EST Ventricular Rate:  122 PR Interval:  123 QRS Duration:  147 QT Interval:  380 QTC Calculation: 542 R Axis:   13  Text Interpretation: Sinus tachycardia Left bundle branch block Compared with prior EKG from 03/28/2024 Confirmed by Calvin Bouchard [phone]) on 05/12/2024 4:50:10 AM  Radiology: [facility] Chest 1 View Result Date: 05/12/2024 EXAM: 1 VIEW(S) XRAY OF THE CHEST 05/12/2024 05:09:00 AM COMPARISON: PET CT 04/28/2024 CLINICAL HISTORY: SOB FINDINGS: LUNGS AND PLEURA: Right mid lung perihilar opacity is new from the previous exam. Left upper lobe mass. Moderate diffuse interstitial edema. Small right pleural effusion. No pneumothorax. HEART AND MEDIASTINUM: Aortic calcification. BONES AND SOFT TISSUES: No acute osseous abnormality. IMPRESSION: 1. Moderate diffuse interstitial edema. 2. Small right pleural effusion. 3. New right mid lung perihilar opacity, compatible with pneumonia . 4. Left upper lobe mass, as described on the comparison PET CT from 04/28/2024 . Electronically signed by: Waddell Calk MD 05/12/2024 06:02 AM EST RP Workstation: [id number]     Procedures   Medications Ordered in the ED  cefTRIAXone  (ROCEPHIN ) 1 g in sodium chloride  0.9 % 100 mL IVPB (has no administration in time range)  aspirin  chewable tablet 324 mg (has no administration in time range)  furosemide  (LASIX ) injection 40 mg (has no administration in time range)  ipratropium-albuterol  (DUONEB) 0.5-2.5 (3) MG/3ML nebulizer solution 3  mL (3 mLs Nebulization Given 05/12/24 0547)  iohexol  (OMNIPAQUE ) 350 MG/ML injection 80 mL (75 mLs Intravenous Contrast Given 05/12/24 0653)                                    Medical Decision Making Cardiac monitor interpretation: Sinus tachycardia, no ectopy  Patient here for shortness of breath.  He has a history of lung cancer that is not currently being treated.  States he feels short of breath and consequently tachypneic, however he has not been hypoxic.  He was placed on 2 L nasal cannula by nursing staff as requested to help with his symptoms.  He was given a DuoNeb as well.  Found to potentially have pneumonia as well as edema and effusion on x-ray.  He was not given IV fluids due to the new effusion and edema on x-ray as well as a history of CHF with decreased ejection fraction.  Started on Rocephin .  Was not started on azithromycin as he has a prolonged QTc seen on his EKG.  Per recent echocardiogram on 12-9 patient has an EF of 25 to 30%.  Think he likely has an exacerbation of his CHF at this time as his BNP and troponin are elevated.  He is not having any chest pain.  I did give him some aspirin  and we will try Lasix  for diuresis.  Patient is pending a CT PE study at this time.  He will require admission for further workup and management.  Patient signed out to oncoming provider at 7 AM pending remainder of workup and ultimate disposition.  Problems Addressed: Acute congestive heart failure, unspecified heart failure type [facility]): undiagnosed new problem with uncertain prognosis Elevated troponin: undiagnosed new problem with uncertain prognosis Pneumonia due to infectious organism, unspecified laterality, unspecified part of lung: acute illness or injury that poses a threat to life or bodily functions SOB (shortness of breath): undiagnosed new problem with uncertain prognosis  Amount and/or Complexity of Data Reviewed External Data Reviewed: notes.    Details: Prior outpatient  records  reviewed-patient with recent echocardiogram on 12-9 and he has an EF of 25 to 30% Labs: ordered. Decision-making details documented in ED Course.    Details: Ordered and reviewed by me and patient has a leukocytosis as well as elevated troponin and BNP Radiology: ordered and independent interpretation performed. Decision-making details documented in ED Course.    Details: Ordered and interpreted by me independently of radiology Chest x-ray: Shows evidence of pulmonary edema, effusion and possible pneumonia CT PE study pending ECG/medicine tests: ordered and independent interpretation performed. Decision-making details documented in ED Course.    Details: Ordered and interpreted by me in the absence of cardiology and shows tachycardia, left bundle branch block and prolonged QTc, other than tachycardia no significant change when compared to prior  Risk OTC drugs. Prescription drug management. Drug therapy requiring intensive monitoring for toxicity. Decision regarding hospitalization.     Final diagnoses:  Pneumonia due to infectious organism, unspecified laterality, unspecified part of lung  Acute congestive heart failure, unspecified heart failure type (HCC)  Elevated troponin  SOB (shortness of breath)    ED Discharge Orders     None          Calvin Duwaine CROME, DO 05/12/24 [phone]

## 2024-05-12 NOTE — Consult Note (Signed)
 CARDIOLOGY CONSULTATION  Patient ID: Calvin Moreno; [id number]; [birth year]   Admit date: 05/12/2024 Date of Consult: 05/12/2024  Primary Care Provider: Shona Norleen PEDLAR, MD Primary Cardiologist: Alvan Carrier, MD  HISTORY OF PRESENT ILLNESS  Calvin Moreno is an 83 y.o. male with history of aortic stenosis and regurgitation followed by Dr. 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  He is currently admitted to the hospitalist team reporting worsening shortness of breath.  States that symptoms have been present over the last few weeks, also intermittent mildly productive cough, subjective fevers and chills.  Reports orthopnea within the last few days and worsening shortness of breath overnight.  No chest pain or recent syncope.  Recent follow-up echocardiogram on December 9 showed decrease in LVEF relative to the prior study, now 25 to 30% range with wall motion abnormalities and indeterminate diastolic function.  Aortic valve regurgitation described as moderate to severe along with low-flow/low gradient aortic stenosis of severe degree, mean AV gradient 28 mmHg and dimensionless index 0.22.  He was seen by Dr. 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  He was previously treated with SBRT at initial diagnosis in December 2024, completed course in February 2025.  He has evidence of local recurrence and oncology note indicates that treatment plan would be for palliation and not expectation of cure.  He also follows with Dr. Davonna at the [facility], was seen in November.  Chest CTA  today was negative for pulmonary embolus but showed substantial interval worsening of opacification versus consolidation involving the left upper lobe and enveloping an ovoid mass in that region, consolidation within the right upper lobe and superior segment of the right lower lobe, and moderate bilateral pleural effusions which are new.  ROS  Pertinent review in history of present illness.  Hearing loss.  Limited mobility with history of prior vertebral fractures.  Past Medical History:  Diagnosis Date   Aortic stenosis, mod 2021   And insufficiency, evaluation by Dr. Edith in 2000   Arthritis    Chronic kidney disease    Borderline; creatinine of 1.6 in 08/2009, but 1.15 in 05/2010   Chronic sinusitis    By MRI   Diverticulosis 2013   GERD (gastroesophageal reflux disease)    Hearing impaired    Hemorrhoids 2006   Hiatal hernia 2006   Hyperlipidemia    Elevated triglycerides   Hypertension    Mild internal carotid artery plaque on MRI/MRA in 2002   Hypothyroidism    IBS (irritable bowel syndrome)    Insomnia    Left bundle branch block 2012   2012   Metabolic syndrome    Fasting hyperglycemia   Nephrolithiasis 08/2009   2011   Obesity    Proctitis    radiation induced   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   s/p XRT   Seasonal allergies    Stroke (HCC)    TIA, no residual, seen on MRI   Tobacco abuse, in remission    60 pack years; quit in 1999   Uses hearing aid    bilateral    Past Surgical History:  Procedure Laterality Date  ANKLE FRACTURE SURGERY     left   BIOPSY  12/31/2022   Procedure: BIOPSY;  Surgeon: Shaaron Lamar HERO, MD;  Location: [facility];  Service: Endoscopy;;   BRONCHIAL BIOPSY  05/14/2023   Procedure: BRONCHIAL BIOPSIES;  Surgeon: Malka Domino, MD;  Location: [facility] ENDOSCOPY;  Service: Pulmonary;;   BRONCHIAL NEEDLE ASPIRATION BIOPSY  05/14/2023   Procedure: BRONCHIAL NEEDLE ASPIRATION BIOPSIES;  Surgeon: Malka Domino, MD;  Location: [facility]  ENDOSCOPY;  Service: Pulmonary;;   CATARACT EXTRACTION Left    CATARACT EXTRACTION W/PHACO Right 12/19/2013   Procedure: CATARACT EXTRACTION PHACO AND INTRAOCULAR LENS PLACEMENT RIGHT EYE CDE=14.78;  Surgeon: Cherene Mania, MD;  Location: [facility];  Service: Ophthalmology;  Laterality: Right;   CHOLECYSTECTOMY     COLONOSCOPY  2006   Dr. Retia mucosa throughout colon, moderate internal hemorrhoids. bx= benign colonic mucosa   COLONOSCOPY  12/03/2011   Dr. Shaaron- colonic diverticulosis, radiation-induced proctitis- s/p APC ablation, no microscopic colitis on bx   COLONOSCOPY WITH PROPOFOL  N/A 06/12/2020   Procedure: COLONOSCOPY WITH PROPOFOL ;  Surgeon: Shaaron Lamar HERO, MD;  Location: [facility];  Service: Endoscopy;  Laterality: N/A;  2:45pm   ESOPHAGOGASTRODUODENOSCOPY  2006   Dr. Celestia- small hiatal hernia and slightly reddened distal esophagus o/w normal   ESOPHAGOGASTRODUODENOSCOPY (EGD) WITH PROPOFOL  N/A 12/31/2022   Procedure: ESOPHAGOGASTRODUODENOSCOPY (EGD) WITH PROPOFOL ;  Surgeon: Shaaron Lamar HERO, MD;  [facility]: [facility];  Service: Endoscopy;  Laterality: N/A;  1245pm, asa 3   FLEXIBLE SIGMOIDOSCOPY N/A 07/13/2013   MFM:Wzncjdrlojm changes of the rectum with active oozing consistent with chronic radiation proctitis.  Otherwise, negative sigmoidoscopy to 40 cm.Status post argon plasma coagulation ablation    FLEXIBLE SIGMOIDOSCOPY N/A 04/02/2023   Procedure: FLEXIBLE SIGMOIDOSCOPY;  Surgeon: Cinderella Deatrice FALCON, MD;  [facility]: [facility];  Service: Endoscopy;  Laterality: N/A;   HOT HEMOSTASIS N/A 07/13/2013   Procedure: HOT HEMOSTASIS (ARGON PLASMA COAGULATION/BICAP);  Surgeon: Lamar HERO Shaaron, MD;  [facility]: [facility];  Service: Endoscopy;  Laterality: N/A;   HOT HEMOSTASIS  04/02/2023   Procedure: HOT HEMOSTASIS (ARGON PLASMA COAGULATION/BICAP);  Surgeon: Cinderella Deatrice FALCON, MD;  [facility];  Service: Endoscopy;;   Ileocolonoscopy  12/03/2011   MFM:Rnonwpr  diverticulosis. Radiation-induced proctitis status post APC ablation. Status post segmental colon Biopsy   MALONEY DILATION N/A 12/31/2022   Procedure: MALONEY DILATION;  Surgeon: Shaaron Lamar HERO, MD;  [facility];  Service: Endoscopy;  Laterality: N/A;   TONSILLECTOMY       INPATIENT MEDICATIONS Scheduled Meds:  ALPRAZolam   0.5 mg Oral QHS   [START ON 05/13/2024] Chlorhexidine  Gluconate Cloth  6 each Topical Q0600   doxycycline   100 mg Oral Q12H   DULoxetine   60 mg Oral QPM   enoxaparin  (LOVENOX ) injection  40 mg Subcutaneous Q24H   [START ON 05/13/2024] finasteride   5 mg Oral Daily   furosemide   40 mg Intravenous Q12H   gabapentin   100 mg Oral QHS   ipratropium-albuterol   3 mL Nebulization Q6H   [START ON 05/13/2024] levothyroxine   25 mcg Oral Q0600   ondansetron        [START ON 05/13/2024] pantoprazole   40 mg Oral QAC breakfast   sacubitril -valsartan   1 tablet Oral BID   simvastatin   20 mg Oral QPM   sodium chloride  flush  3 mL Intravenous Q12H   spironolactone   12.5 mg Oral Daily   tamsulosin   0.4 mg Oral QPC supper   Continuous Infusions:  sodium chloride      [  START ON 05/13/2024] cefTRIAXone  (ROCEPHIN )  IV     PRN Meds: sodium chloride , acetaminophen , artificial tears, ondansetron , prochlorperazine , sodium chloride  flush  ALLERGIES Allergies[1]  SOCIAL HISTORY  Social History   Tobacco Use   Smoking status: Former    Current packs/day: 0.00    Average packs/day: 1.5 packs/day for 40.0 years (60.0 ttl pk-yrs)    Types: Cigarettes    Start date: 11/04/1957    Quit date: 11/04/1997    Years since quitting: 26.5    Passive exposure: Never   Smokeless tobacco: Never   Tobacco comments:    Quit in 1999  Substance Use Topics   Alcohol  use: No    Alcohol /week: 0.0 standard drinks of alcohol     FAMILY HISTORY   The patient's family history includes Cirrhosis in his brother; Heart attack in his father; Heart failure in his brother and mother. There is no  history of Colon cancer.  PHYSICAL EXAM & DATA  Vitals:   05/12/24 0830 05/12/24 0900 05/12/24 1105 05/12/24 1130  BP: 113/78 108/67  107/65  Pulse: (!) 114 (!) 107 (!) 106 (!) 104  Resp: (!) 29 (!) 25 18 (!) 24  Temp:   97.8 F (36.6 C)   TempSrc:   Oral   SpO2: 97% 97% 96% 96%  Weight:      Height:        Intake/Output Summary (Last 24 hours) at 05/12/2024 1235 Last data filed at 05/12/2024 0749 Gross per 24 hour  Intake 100 ml  Output --  Net 100 ml   Filed Weights   05/12/24 0447  Weight: 72.6 kg   Body mass index is 26.63 kg/m.   Gen: Elderly male in no acute distress. HEENT: Conjunctiva and lids normal, oropharynx clear. Neck: Supple, no elevated JVP or carotid bruits. Lungs: Decreased breath sounds, egophony left mid upper lung zone, no wheezing. Cardiac: Regular rate and rhythm, no S3, 2/6 systolic murmur, no pericardial rub. Abdomen: Soft, nontender, bowel sounds present. Extremities: Mild lower leg edema. Skin: Warm and dry. Musculoskeletal: No kyphosis. Neuropsychiatric: Alert and oriented x3, affect grossly appropriate.  EKG:  An ECG dated 05/12/2024 was personally reviewed today and demonstrated:  Sinus tachycardia with left bundle branch block.  Telemetry:  I personally reviewed telemetry which shows sinus tachycardia.  RELEVANT CV STUDIES  Echocardiogram 05/03/2024:  1. No LV thrombus by Definity . Left ventricular ejection fraction, by  estimation, is 25 to 30%. Left ventricular ejection fraction by 3D volume  is 37 %. The left ventricle has severely decreased function. The left  ventricle demonstrates regional wall  motion abnormalities (see scoring diagram/findings for description). There  is mild left ventricular hypertrophy. Indeterminate diastolic filling due  to E-A fusion. The average left ventricular global longitudinal strain is  -3.3 %. The global longitudinal   strain is abnormal.   2. Right ventricular systolic function is normal.  The right ventricular  size is normal. Tricuspid regurgitation signal is inadequate for assessing  PA pressure.   3. The mitral valve is abnormal. No evidence of mitral valve  regurgitation. Severe mitral annular calcification.   4. The aortic valve has an indeterminant number of cusps. There is severe  calcifcation of the aortic valve. Aortic valve regurgitation is moderate  to severe. Severe aortic valve stenosis, low flow-low gradient. Aortic  regurgitation PHT measures 231  msec. Aortic valve area, by VTI measures 0.43 cm. Aortic valve mean  gradient measures 28.0 mmHg. Aortic valve Vmax measures 3.48 m/s. DVI is  0.22 and SVI are 15.8, worse compared to prior echo.   5. The inferior vena cava is normal in size with greater than 50%  respiratory variability, suggesting right atrial pressure of 3 mmHg.   LABORATORY DATA  Chemistry Recent Labs  Lab 05/12/24 0442  NA 139  K 4.7  CL 100  CO2 30  GLUCOSE 202*  BUN 20  CREATININE 1.11  CALCIUM 9.1  GFRNONAA >60  ANIONGAP 9     Hematology Recent Labs  Lab 05/12/24 0442  WBC 16.0*  RBC 4.85  HGB 13.5  HCT 43.0  MCV 88.7  MCH 27.8  MCHC 31.4  RDW 13.7  PLT 523*   Cardiac Enzymes Recent Labs  Lab 05/12/24 0442 05/12/24 0637  TRNPT 246* 223*    BNP Recent Labs  Lab 05/12/24 0442  PROBNP 5,322.0*     RADIOLOGY/STUDIES CT Angio Chest Pulmonary Embolism (PE) W or WO Contrast Result Date: 05/12/2024 EXAM: CTA CHEST 05/12/2024 07:21:54 AM TECHNIQUE: CTA of the chest was performed after the administration of intravenous contrast. Multiplanar reformatted images are provided for review. MIP images are provided for review. Automated exposure control, iterative reconstruction, and/or weight based adjustment of the mA/kV was utilized to reduce the radiation dose to as low as reasonably achievable. COMPARISON: CT of the chest dated 04/06/2024. CLINICAL HISTORY: sob FINDINGS: PULMONARY ARTERIES: Pulmonary arteries are  adequately opacified for evaluation. No acute pulmonary embolus. The right main pulmonary artery is dilated, measuring approximately 3.0 cm in diameter. MEDIASTINUM: The heart and pericardium demonstrate no acute abnormality. There is calcific coronary artery disease present. There is also calcification of the mitral annulus. The thoracic aorta is normal in caliber, but demonstrates mild-to-moderate calcific atheromatous disease. LYMPH NODES: No mediastinal, hilar or axillary lymphadenopathy. LUNGS AND PLEURA: There has been significant interval worsening of opacification/consolidation of the left upper lobe, enveloping the ovoid mass noted within the left upper lobe on the previous study. There is also patchy consolidation of the right upper lobe and superior segment of the right lower lobe. There is also patchy opacification/consolidation of the superior segment of the left lower lobe. Since the previous study, the patient has developed moderate bilateral pleural effusions. No pneumothorax. UPPER ABDOMEN: Limited images of the upper abdomen are unremarkable. SOFT TISSUES AND BONES: No acute bone or soft tissue abnormality. IMPRESSION: 1. No evidence of pulmonary embolus. 2. Significant interval worsening of opacification/consolidation of the left upper lobe, enveloping the ovoid mass noted within the left upper lobe on the previous study. 3. Patchy consolidation of the right upper lobe and superior segment of the right lower lobe. 4. Patchy opacification/consolidation of the superior segment of the left lower lobe. 5. Moderate bilateral pleural effusions, new since the previous study. 6. Dilated right main pulmonary artery, measuring approximately 3.0 cm in diameter, which can be seen with pulmonary arterial hypertension. 7. Calcific coronary artery disease and mitral annular calcification. Electronically signed by: Evalene Coho MD 05/12/2024 07:50 AM EST RP Workstation: [id number]   DG Chest 1 View Result  Date: 05/12/2024 EXAM: 1 VIEW(S) XRAY OF THE CHEST 05/12/2024 05:09:00 AM COMPARISON: PET CT 04/28/2024 CLINICAL HISTORY: SOB FINDINGS: LUNGS AND PLEURA: Right mid lung perihilar opacity is new from the previous exam. Left upper lobe mass. Moderate diffuse interstitial edema. Small right pleural effusion. No pneumothorax. HEART AND MEDIASTINUM: Aortic calcification. BONES AND SOFT TISSUES: No acute osseous abnormality. IMPRESSION: 1. Moderate diffuse interstitial edema. 2. Small right pleural effusion. 3. New right mid lung perihilar opacity, compatible with  pneumonia . 4. Left upper lobe mass, as described on the comparison PET CT from 04/28/2024 . Electronically signed by: Waddell Calk MD 05/12/2024 06:02 AM EST RP Workstation: [id number]    ASSESSMENT & PLAN  1.  HFrEF, newly documented by echocardiogram on December 9, LVEF 25 to 30% range which has decreased from normal compared to study in April.  Wall motion abnormalities also described raising possibility of underlying CAD, however progressive aortic stenosis and regurgitation is likely major contributor.  Now with clinical evidence of acute heart failure, proBNP 5322 and bilateral pleural effusions by chest CT.  High-sensitivity troponin I levels are elevated but in relatively flat pattern more consistent with demand ischemia than ACS.  He reports worsening shortness of breath over the last few weeks, but worse in the last few days.  2.  Severe low-flow/low gradient aortic stenosis.  Mean AV gradient up to 28 mmHg with dimensionless index 0.22 by recent echocardiogram.  Also has associated moderate to severe aortic regurgitation.  3.  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  Treatment options being contemplated by oncology, although it sounds like ultimate goal would be palliation rather than expected cure.  I am not certain about expected prognosis  based on available information.  4.  Possible pneumonia based on chest CT as discussed above.  Has increased oxygen requirement.  5.  Left bundle branch block, old.  6.  Prior history of GI bleeding stercoral ulcer.  I had an extensive discussion with the patient, son, and daughter today about his comorbid illnesses and limitations/concerns about invasive treatment strategy.  I also spoke with his wife.  He is not a candidate for SAVR, and at this point not an optimal candidate for TAVR either given recurrent lung cancer with treatment plan not yet determined and concern for active pneumonia.  Risk for TAVR would be quite high and he would need to undergo cardiac catheterization and cardiac CT first as part of workup even to see if he were a candidate.  Oncology consultation pending, it would be good to have a better understanding of his overall prognosis from the perspective of lung cancer, what treatment options would even be considered and whether these would have any impact on his cardiac status, and what type of quality of life could he expect under chemotherapy treatment if he were to decide to pursue it.  I do not think that pushing ahead with a TAVR evaluation makes sense at the present time in light of comorbid illnesses. If he shows clinically improvement and through family discussion there is a decision to pursue at least a consultation regarding TAVR candidacy, he would need to be transferred to [facility] and assessed by the structural heart team.  In the meanwhile he has been started on Lasix  for diuresis along with antibiotic therapy per primary team.  We will continue to follow with you.  For questions or updates, please contact [facility] Please consult [email or web address] for contact info under   Signed, Jayson Sierras, MD  05/12/2024 12:35 PM      [1]  Allergies Allergen Reactions   Norvasc  [Amlodipine  Besylate] Swelling    Ankle swelling   Codeine Nausea And  Vomiting   Amoxicillin Rash

## 2024-05-12 NOTE — H&P (Signed)
 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  He does not use oxygen at home.  He he had an echocardiogram done on 05/03/2024 with findings of LVEF 25 to 30% down from 50 to 55% with severe AS and regional wall motion abnormalities.  Family says they have been trying to get in to see his cardiologist but has been having a difficult time getting an appointment.  Patient has had some increasing peripheral edema and weight gain.  He denies chest pain.  His main complaint has been shortness of breath.  He denies fever and chills.  He has had increasing cough and chest congestion.  He had a recent reported viral illness that lasted for 1/2-day and then resolved.  He saw [facility] oncology 05/11/2024 and they have explained to the patient that his lung cancer has progressed locally and any therapy would be palliative and not curative.  Patient was supposed to see Dr. Davonna at the [facility] later today for second opinion.  He presented to the ED due to progressive shortness of breath.  His ED work up significant for acute heart failure with bilateral pleural effusions,  right upper lobe and left lower lobe consolidation likely pneumonia, negative for pulmonary embolus, new oxygen requirement at 2 L/min, tachypnea and tachycardia.  Admission was requested for management of acute heart failure and acute respiratory failure with hypoxia with new oxygen requirements.    Past Medical History:  Diagnosis Date   Aortic stenosis, mod 2021   And insufficiency, evaluation by Dr. Edith in 2000   Arthritis    Benign prostatic hypertrophy    PSA of 1.39 in 09/2010   Chronic kidney disease    Borderline; creatinine of 1.6 in 08/2009, but 1.15 in 05/2010   Chronic sinusitis    By MRI   Diverticulosis 2013   GERD (gastroesophageal reflux disease)    Hearing impaired    Hemorrhoids 2006   Hiatal hernia 2006   History of kidney stones    HOH (hard of hearing)    Hyperlipidemia    Elevated triglycerides   Hypertension    Mild internal carotid artery plaque on MRI/MRA in 2002   Hypothyroidism    IBS (irritable bowel syndrome)    Insomnia    Left bundle branch block 2012   2012   Metabolic syndrome    Fasting hyperglycemia   Murmur    Nephrolithiasis 08/2009   2011   Obesity    Proctitis    radiation induced   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   s/p XRT   Seasonal allergies    Stroke (HCC)    TIA, no residual, seen on MRI   Tobacco abuse, in remission    60  pack years; quit in 1999   Uses hearing aid    bilateral    Past Surgical History:  Procedure Laterality Date   ANKLE FRACTURE SURGERY     left   BIOPSY  12/31/2022   Procedure: BIOPSY;  Surgeon: Shaaron Lamar HERO, MD;  [facility]: [facility];  Service: Endoscopy;;   BRONCHIAL BIOPSY  05/14/2023   Procedure: BRONCHIAL BIOPSIES;  Surgeon: Malka Domino, MD;  Location: [facility];  Service: Pulmonary;;   BRONCHIAL NEEDLE ASPIRATION BIOPSY  05/14/2023   Procedure: BRONCHIAL NEEDLE ASPIRATION BIOPSIES;  Surgeon: Malka Domino, MD;  Location: [facility];  Service: Pulmonary;;   CATARACT  EXTRACTION Left    CATARACT EXTRACTION W/PHACO Right 12/19/2013   Procedure: CATARACT EXTRACTION PHACO AND INTRAOCULAR LENS PLACEMENT RIGHT EYE CDE=14.78;  Surgeon: Cherene Mania, MD;  Location: [facility];  Service: Ophthalmology;  Laterality: Right;   CHOLECYSTECTOMY     COLONOSCOPY  2006   Dr. Retia mucosa throughout colon, moderate internal hemorrhoids. bx= benign colonic mucosa   COLONOSCOPY  12/03/2011   Dr. Shaaron- colonic diverticulosis, radiation-induced proctitis- s/p APC ablation, no microscopic colitis on bx   COLONOSCOPY WITH PROPOFOL  N/A 06/12/2020   Procedure: COLONOSCOPY WITH PROPOFOL ;  Surgeon: Shaaron Lamar HERO, MD;  Location: [facility];  Service: Endoscopy;  Laterality: N/A;  2:45pm   ESOPHAGOGASTRODUODENOSCOPY  2006   Dr. Celestia- small hiatal hernia and slightly reddened distal esophagus o/w normal   ESOPHAGOGASTRODUODENOSCOPY (EGD) WITH PROPOFOL  N/A 12/31/2022   Procedure: ESOPHAGOGASTRODUODENOSCOPY (EGD) WITH PROPOFOL ;  Surgeon: Shaaron Lamar HERO, MD;  [facility];  Service: Endoscopy;  Laterality: N/A;  1245pm, asa 3   FLEXIBLE SIGMOIDOSCOPY N/A 07/13/2013   MFM:Wzncjdrlojm changes of the rectum with active oozing consistent with chronic radiation proctitis.  Otherwise, negative sigmoidoscopy to 40 cm.Status post argon plasma coagulation ablation    FLEXIBLE SIGMOIDOSCOPY N/A 04/02/2023   Procedure: FLEXIBLE SIGMOIDOSCOPY;  Surgeon: Cinderella Deatrice FALCON, MD;  [facility];  Service: Endoscopy;  Laterality: N/A;   HOT HEMOSTASIS N/A 07/13/2013   Procedure: HOT HEMOSTASIS (ARGON PLASMA COAGULATION/BICAP);  Surgeon: Lamar HERO Shaaron, MD;  [facility];  Service: Endoscopy;  Laterality: N/A;   HOT HEMOSTASIS  04/02/2023   Procedure: HOT HEMOSTASIS (ARGON PLASMA COAGULATION/BICAP);  Surgeon: Cinderella Deatrice FALCON, MD;  [facility];  Service: Endoscopy;;   Ileocolonoscopy  12/03/2011   MFM:Rnonwpr diverticulosis. Radiation-induced proctitis status  post APC ablation. Status post segmental colon Biopsy   MALONEY DILATION N/A 12/31/2022   Procedure: MALONEY DILATION;  Surgeon: Shaaron Lamar HERO, MD;  [facility];  Service: Endoscopy;  Laterality: N/A;   TONSILLECTOMY       reports that he quit smoking about 26 years ago. His smoking use included cigarettes. He started smoking about 66 years ago. He has a 60 pack-year smoking history. He has never been exposed to tobacco smoke. He has never used smokeless tobacco. He reports that he does not drink alcohol  and does not use drugs.  Allergies[1]  Family History  Problem Relation Age of Onset   Heart failure Mother    Heart attack Father    Heart failure Brother        bladder cancer (agent orange exposure)   Cirrhosis Brother        chronic viral hepatitis   Colon cancer Neg Hx     Prior to Admission medications  Medication Sig Start Date End Date Taking? Authorizing Provider  acetaminophen  (TYLENOL ) 500 MG tablet Take  1,000 mg by mouth every 6 (six) hours as needed for moderate pain (pain score 4-6).   Yes [provider]  acyclovir (ZOVIRAX) 800 MG tablet Take 800 mg by mouth every 8 (eight) hours as needed. And give one tablet by mouth once daily.   Yes [provider]  ALPRAZolam  (XANAX ) 0.5 MG tablet Take 1 tablet (0.5 mg total) by mouth at bedtime. 05/04/23  Yes Abdul Fine, MD  alum & mag hydroxide-simeth (MAALOX PLUS) 400-400-40 MG/5ML suspension Take 5 mLs by mouth every 4 (four) hours as needed for indigestion.   Yes [provider]  Ascorbic Acid (VITAMIN C) 1000 MG tablet Take 1,000 mg by mouth daily.   Yes [provider]  Clobetasol Propionate Emulsion 0.05 % topical foam Apply 1 Application topically 2 (two) times daily. 04/29/24  Yes [provider]  docusate sodium  (COLACE) 100 MG capsule Take 1 capsule (100 mg total) by mouth 2 (two) times daily. 04/02/23  Yes Ricky Fines, MD  DULoxetine  (CYMBALTA ) 60 MG capsule  Take 60 mg by mouth every evening. 04/11/24  Yes [provider]  finasteride  (PROSCAR ) 5 MG tablet Take 1 tablet (5 mg total) by mouth daily. 09/23/23  Yes McKenzie, Belvie CROME, MD  furosemide  (LASIX ) 20 MG tablet Take 20 mg by mouth daily.   Yes [provider]  gabapentin  (NEURONTIN ) 100 MG capsule Take 1 capsule (100 mg total) by mouth at bedtime. 10/05/23  Yes Rogers Hai, MD  levothyroxine  (SYNTHROID ) 25 MCG tablet Take 25 mcg by mouth daily. 10/24/22  Yes [provider]  Melatonin 3 MG TABS Take 3 mg by mouth at bedtime. For sleep   Yes [provider]  pantoprazole  (PROTONIX ) 40 MG tablet Take 1 tablet (40 mg total) by mouth daily before breakfast. 03/03/23  Yes Rourk, Lamar HERO, MD  Polyethyl Glycol-Propyl Glycol (SYSTANE OP) Place 1 drop into both eyes 2 (two) times daily as needed (dry eyes).   Yes [provider]  polyethylene glycol (MIRALAX  / GLYCOLAX ) 17 g packet Take 17 g by mouth daily. Can be spread to every other day in the setting of diarrhea. 04/02/23  Yes Ricky Fines, MD  potassium chloride  (KLOR-CON ) 10 MEQ tablet Take 10 mEq by mouth daily.   Yes [provider]  psyllium (HYDROCIL/METAMUCIL) 95 % PACK Take 1 packet by mouth 2 (two) times daily. 04/02/23  Yes Ricky Fines, MD  silodosin  (RAPAFLO ) 8 MG CAPS capsule Take 1 capsule (8 mg total) by mouth 2 (two) times daily. 09/23/23  Yes McKenzie, Belvie CROME, MD  simvastatin  (ZOCOR ) 20 MG tablet Take 20 mg by mouth every evening.   Yes [provider]  traMADol  (ULTRAM ) 50 MG tablet Take 25-50 mg by mouth 2 (two) times daily as needed for moderate pain (pain score 4-6). 05/28/23  Yes [provider]  vitamin B-12 (CYANOCOBALAMIN ) 1000 MCG tablet Take 2,000 mcg by mouth daily.   Yes [provider]    Physical Exam: Vitals:   05/12/24 0800 05/12/24 0830 05/12/24 0900 05/12/24 1105  BP: 115/74 113/78 108/67   Pulse: (!) 115 (!) 114 (!) 107 (!)  106  Resp: (!) 25 (!) 29 (!) 25 18  Temp:      TempSrc:      SpO2: 98% 97% 97% 96%  Weight:      Height:        Constitutional: Patient appears acutely and chronically ill, somnolent but arousable, NAD, calm, comfortable Eyes: PERRL, lids and conjunctivae  normal ENMT: Mucous membranes are moist. Posterior pharynx clear of any exudate or lesions.Normal dentition.  Neck: normal, supple, no masses, no thyromegaly Respiratory: clear to auscultation bilaterally, no wheezing, no crackles. Normal respiratory effort. No accessory muscle use.  Cardiovascular: normal s1, s2 sounds, no murmurs / rubs / gallops. No extremity edema. 2+ pedal pulses. No carotid bruits.  Abdomen: no tenderness, no masses palpated. No hepatosplenomegaly. Bowel sounds positive.  Musculoskeletal: no clubbing / cyanosis. No joint deformity upper and lower extremities. Good ROM, no contractures. Normal muscle tone.  Skin: no rashes, lesions, ulcers. No induration Neurologic: CN 2-12 grossly intact. Sensation intact, DTR normal. Strength 5/5 in all 4.  Psychiatric: Normal judgment and insight. Alert and oriented x 3. Normal mood.   Labs on Admission: I have personally reviewed following labs and imaging studies  CBC: Recent Labs  Lab 05/12/24 0442  WBC 16.0*  NEUTROABS 14.4*  HGB 13.5  HCT 43.0  MCV 88.7  PLT 523*   Basic Metabolic Panel: Recent Labs  Lab 05/12/24 0442  NA 139  K 4.7  CL 100  CO2 30  GLUCOSE 202*  BUN 20  CREATININE 1.11  CALCIUM 9.1   GFR: Estimated Creatinine Clearance: 42.3 mL/min (by C-G formula based on SCr of 1.11 mg/dL). Liver Function Tests: No results for input(s): AST, ALT, ALKPHOS, BILITOT, PROT, ALBUMIN in the last 168 hours. No results for input(s): LIPASE, AMYLASE in the last 168 hours. No results for input(s): AMMONIA in the last 168 hours. Coagulation Profile: No results for input(s): INR, PROTIME in the last 168 hours. Cardiac Enzymes: No  results for input(s): CKTOTAL, CKMB, CKMBINDEX, TROPONINI in the last 168 hours. BNP (last 3 results) Recent Labs    05/12/24 0442  PROBNP 5,322.0*   HbA1C: No results for input(s): HGBA1C in the last 72 hours. CBG: No results for input(s): GLUCAP in the last 168 hours. Lipid Profile: No results for input(s): CHOL, HDL, LDLCALC, TRIG, CHOLHDL, LDLDIRECT in the last 72 hours. Thyroid  Function Tests: No results for input(s): TSH, T4TOTAL, FREET4, T3FREE, THYROIDAB in the last 72 hours. Anemia Panel: No results for input(s): VITAMINB12, FOLATE, FERRITIN, TIBC, IRON, RETICCTPCT in the last 72 hours. Urine analysis:    Component Value Date/Time   COLORURINE YELLOW 03/30/2023 1930   APPEARANCEUR Clear 03/30/2024 1443   LABSPEC 1.023 03/30/2023 1930   PHURINE 5.0 03/30/2023 1930   GLUCOSEU Negative 03/30/2024 1443   HGBUR NEGATIVE 03/30/2023 1930   BILIRUBINUR Negative 03/30/2024 1443   KETONESUR NEGATIVE 03/30/2023 1930   PROTEINUR Negative 03/30/2024 1443   PROTEINUR NEGATIVE 03/30/2023 1930   UROBILINOGEN negative (A) 10/19/2019 1444   UROBILINOGEN 0.2 08/26/2009 0930   NITRITE Negative 03/30/2024 1443   NITRITE NEGATIVE 03/30/2023 1930   LEUKOCYTESUR Negative 03/30/2024 1443   LEUKOCYTESUR NEGATIVE 03/30/2023 1930    Radiological Exams on Admission: CT Angio Chest Pulmonary Embolism (PE) W or WO Contrast Result Date: 05/12/2024 EXAM: CTA CHEST 05/12/2024 07:21:54 AM TECHNIQUE: CTA of the chest was performed after the administration of intravenous contrast. Multiplanar reformatted images are provided for review. MIP images are provided for review. Automated exposure control, iterative reconstruction, and/or weight based adjustment of the mA/kV was utilized to reduce the radiation dose to as low as reasonably achievable. COMPARISON: CT of the chest dated 04/06/2024. CLINICAL HISTORY: sob FINDINGS: PULMONARY ARTERIES: Pulmonary arteries  are adequately opacified for evaluation. No acute pulmonary embolus. The right main pulmonary artery is dilated, measuring approximately 3.0 cm in diameter. MEDIASTINUM: The heart and  pericardium demonstrate no acute abnormality. There is calcific coronary artery disease present. There is also calcification of the mitral annulus. The thoracic aorta is normal in caliber, but demonstrates mild-to-moderate calcific atheromatous disease. LYMPH NODES: No mediastinal, hilar or axillary lymphadenopathy. LUNGS AND PLEURA: There has been significant interval worsening of opacification/consolidation of the left upper lobe, enveloping the ovoid mass noted within the left upper lobe on the previous study. There is also patchy consolidation of the right upper lobe and superior segment of the right lower lobe. There is also patchy opacification/consolidation of the superior segment of the left lower lobe. Since the previous study, the patient has developed moderate bilateral pleural effusions. No pneumothorax. UPPER ABDOMEN: Limited images of the upper abdomen are unremarkable. SOFT TISSUES AND BONES: No acute bone or soft tissue abnormality. IMPRESSION: 1. No evidence of pulmonary embolus. 2. Significant interval worsening of opacification/consolidation of the left upper lobe, enveloping the ovoid mass noted within the left upper lobe on the previous study. 3. Patchy consolidation of the right upper lobe and superior segment of the right lower lobe. 4. Patchy opacification/consolidation of the superior segment of the left lower lobe. 5. Moderate bilateral pleural effusions, new since the previous study. 6. Dilated right main pulmonary artery, measuring approximately 3.0 cm in diameter, which can be seen with pulmonary arterial hypertension. 7. Calcific coronary artery disease and mitral annular calcification. Electronically signed by: Evalene Coho MD 05/12/2024 07:50 AM EST RP Workstation: [id number]   DG Chest 1  View Result Date: 05/12/2024 EXAM: 1 VIEW(S) XRAY OF THE CHEST 05/12/2024 05:09:00 AM COMPARISON: PET CT 04/28/2024 CLINICAL HISTORY: SOB FINDINGS: LUNGS AND PLEURA: Right mid lung perihilar opacity is new from the previous exam. Left upper lobe mass. Moderate diffuse interstitial edema. Small right pleural effusion. No pneumothorax. HEART AND MEDIASTINUM: Aortic calcification. BONES AND SOFT TISSUES: No acute osseous abnormality. IMPRESSION: 1. Moderate diffuse interstitial edema. 2. Small right pleural effusion. 3. New right mid lung perihilar opacity, compatible with pneumonia . 4. Left upper lobe mass, as described on the comparison PET CT from 04/28/2024 . 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 with recent TTE showing LVEF down to 25-30% with severe AS and regional wall motion abnormalities  -- family reports that they had been trying to get him an outpatient appt with cardiology but having a difficult time and requested an inpatient consult -- will request an inpatient consult  -- agree with IV furosemide  for diuresis -- heart failure inpatient orderset utilized -- trial of slow go GDMT as tolerated given his advanced age -- further recommendations to follow -- repeating CXR in AM   Acute respiratory failure with hypoxia -- new oxygen requirement -- he is feeling better on supplemental oxygen -- secondary to acute heart  failure, pneumonia, lung cancer recurrence  -- home O2 eval will be done prior to discharge  Lung cancer LUL with new recurrence  -- pt seen by [facility] oncology yesterday for  2nd opinion and palliative treatment offered, not curative -- pt wanting another opinion from [facility] for another opinion -- I contacted [facility] and they are agreeable to consult -- palliative medicine consult for goals of care requested   Essential hypertension -- blood pressure soft but stable  Hypothyroidism -- resume home meds when reconciled  BPH -- resume home meds, bladder scan every shift -- monitor for acute urinary retention on furosemide   History of prostate cancer -- he was diagnosed in 2012, s/p radiation treatment at [facility]  DVT prophylaxis: enoxaparin    Code Status: Full   Family Communication: updated bedside 05/12/24   Disposition Plan: TBD   Consults called: oncology, cardiology   Admission status: INP Time spent: 70 mins  Level of care: Stepdown Afton Louder MD [facility] How to contact the [facility] Attending or Consulting provider 7A - 7P or covering provider during after hours 7P -7A, for this patient?  Check the care team in [place] [facility] and look for a) attending/consulting [facility] provider listed and b) the [facility] team listed Log into [email or web address] and use [place]'s universal password to access. If you do not have the password, please contact the hospital operator. Locate the [facility] provider you are looking for under [facility] and page to a number that you can be directly reached. If you still have difficulty reaching the provider, please page the [facility] (Director on Call) for the Hospitalists listed on amion for assistance.   If 7PM-7AM, please contact night-coverage [email or web address] Password [email or web address]  05/12/2024, 11:18 AM        [1]  Allergies Allergen Reactions   Norvasc  [Amlodipine  Besylate] Swelling    Ankle swelling   Codeine Nausea And Vomiting    Amoxicillin Rash

## 2024-05-12 NOTE — Consult Note (Signed)
 Palliative Care Consult Note                                  Date: 05/12/2024   Patient Name: Calvin Moreno  DOB: [birth year]  MRN: [id number]  Age / Sex: 83 y.o., male  PCP: Shona Norleen PEDLAR, MD Referring Physician: Vicci Afton CROME, MD  Reason for Consultation: Establishing goals of care  Past Medical History:  Diagnosis Date   Aortic stenosis, mod 2021   And insufficiency, evaluation by Dr. Edith in 2000   Arthritis    Chronic kidney disease    Borderline; creatinine of 1.6 in 08/2009, but 1.15 in 05/2010   Chronic sinusitis    By MRI   Diverticulosis 2013   GERD (gastroesophageal reflux disease)    Hearing impaired    Hemorrhoids 2006   Hiatal hernia 2006   Hyperlipidemia    Elevated triglycerides   Hypertension    Mild internal carotid artery plaque on MRI/MRA in 2002   Hypothyroidism    IBS (irritable bowel syndrome)    Insomnia    Left bundle branch block 2012   2012   Metabolic syndrome    Fasting hyperglycemia   Nephrolithiasis 08/2009   2011   Obesity    Proctitis    radiation induced   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   s/p XRT   Seasonal allergies    Stroke (HCC)    TIA, no residual, seen on MRI   Tobacco abuse, in remission    60 pack years; quit in 1999   Uses hearing aid    bilateral    Subjective:   This NP [name] Kays reviewed medical records, received report from team, assessed the patient and then meet at the patient's bedside to discuss diagnosis, prognosis, GOC, EOL wishes disposition and options.  Before meeting with the patient/family, I spent time reviewing the chart notes including ED provider note from today, notes or notes from today, admission H&P from today, cardiology note from today, oncology note from today, TOC note from today. I also reviewed vital signs, nursing flowsheets, medication administrations record, labs, and imaging. Labs reviewed include BMP which shows mild bump in  creatinine to 1.11 although still normal in the setting of heart failure exacerbation.  BNP elevated at 5322 in the setting of heart failure exacerbation.  CBC shows elevated  white blood cell at 16 in the setting of pneumonia, Respiratory viral panel negative, lactic acid normal at 1.4.  I met with the patient at the bedside, although he is not able to have meaningful and coherent conversation.  His family is also present at bedside including his spouse Calvin Moreno, daughter Calvin Moreno, and son Calvin Moreno.   We meet to discuss diagnosis prognosis, GOC, EOL wishes, disposition and options. Concept of Palliative Care was introduced as specialized medical care for people and their families living with serious illness.  If focuses on providing relief from the symptoms and stress of a serious illness.  The goal is to improve quality of life for both the patient and the family. Values and goals of care important to patient and family were attempted to be elicited.  Created space and opportunity for patient  and family to explore thoughts and feelings regarding current medical situation   Natural trajectory and current clinical status were discussed. Questions and concerns addressed. Patient  encouraged to call with questions or concerns.    Patient/Family Understanding of  Illness: Family notes that he started his cancer journey here at [facility] but was not a surgical candidate and transferred to treatment through [facility].  He has been working closely with [facility] then here.  He had an oncology appointment with Dr. MARLA here scheduled for today.  However now he is admitted.  He saw medical oncology at [facility] yesterday when they noted palliative treatment, no care.  Their plan was to follow-up here with his primary oncologist for further discussion.  They note he has had heart problems for years, chronically sees Dr. Alvan who noted in the past that he would eventually likely need a valve replacement.  Now, his overall clinical  picture has deteriorated and they are not sure if a valve replacement is advisable.  We spent time talk about details of his chronic and acute clinical picture.  Life Review: The patient is wife have been married for 64 years and have 2 children, daughter Calvin Moreno and son Calvin Moreno.  He previously worked at the [employer] as a curator for 25 years, but has been retired for some time.  Patient Values: Feeling decent, functional independence, enjoys spending time with his dog and cat.  He is a faithful person and follows the [employer] faith but is not a regular church growing individual.  I offered availability of chaplain support while admitted, should he desire.  Baseline Status: Functionally independent, still drives and cuts the grass.  Independent in all IADLs.  Today's Discussion: In addition to discussions described above we have extensive discussion on various topics.  We talked about his current altered mental status.  I shared that this can likely be explained by his ongoing infection and advanced age with infection causing confusion.  We discussed possibility of improvement in mental status back towards baseline as infection is treated, family remains hopeful.  We talked about his overall cancer journey and likely limited options for cure.  Finally we talked about the decompensation in his heart failure and the fact that this is likely because of his poor valve.  We talked that he is not a candidate for surgical aortic valve replacement, likely not an optimal candidate for TAVR.  However, if his clinical picture improves as far as his pneumonia and options are discussed with oncology, there is a possibility of transfer to [facility] for formal workup for TAVR.  Family has not decided this is good being there goals of care.  We spent time talk about CODE STATUS as well.  Family shared that he has completed advance care planning documents including living will.  His wife does not think  that he would want resuscitation.  Son shares that in the past he said if there is a chance then go ahead and try.  However, we discussed the inability to determine if there is a chance but rather shared statistics related to resuscitative efforts and acute and chronically ill patients such as himself and the fact that it is unlikely to provide a meaningful recovery.  Family understands this and seems to be leaning towards DO NOT RESUSCITATE.  However, they would like their father to be more mentally with it to be able to have a conversation with him.  I shared the palliative medicine is happy to support these conversations if they needed.  Identify conversation the patient's son Toa was able to systemically stand up their understanding of 3 possible paths forward.  1 would be full scope of care including transfer to [facility] for TAVR workup and  possible TAVR, understanding this would likely result in significant hospital stay which the patient may not be interested in.  Another option would be if they choose not to have TAVR workup or if TAVR workup is not an option, then trying to treat his acute pneumonia and discharged home to continue treatment and curative intent of his chronic illnesses understanding of heart failure will be part of the new baseline picture and likely result in overall decline in health over time at some pace.  Finally, another option would be if the patient decides he does not want to keep seeking treatment and cure for his increasing chronic illnesses and decides to go home to be comfortable, which would be more of a hospice picture.  We decided to allow some time for outcomes to see how he does over the coming days with treatment of his pneumonia, input from oncology, ongoing input from cardiology.  Palliative medicine will follow-up on Monday when I am back on service for ongoing discussions.  Before the end of my conversation, patient's family expressed concern about baseline  constipation and use of opioids.  I shared that I would communicate with the hospitalist request for initiation of a bowel regimen to try to stay ahead of constipation.  They also indicate he tends to have a lot of anxiety and may at some point need prn anxiolytics.  I communicated both of these to the hospitalist currently on.  I provided emotional and general support through therapeutic listening, empathy, sharing of stories, therapeutic touch, and other techniques. I answered all questions and addressed all concerns to the best of my ability.  Goals: Full code, full scope of care for now.  Family wanting to engage the patient in CODE STATUS and scope of care discussions when he is more able to participate.  Time for outcomes.  Ongoing GOC conversations with palliative involvement.  Review of Systems  Cardiovascular:  Negative for chest pain.  Gastrointestinal:  Negative for abdominal pain, nausea and vomiting.  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  Constitutional:      Comments: Intermittently falls asleep  HENT:     Head: Normocephalic and atraumatic.  Cardiovascular:     Rate and Rhythm: Tachycardia present.  Pulmonary:     Effort: Pulmonary effort is normal. No respiratory distress.  Abdominal:     General: Abdomen is flat.   Skin:    General: Skin is warm and dry.  Neurological:     Mental Status: He is disoriented and confused.  Psychiatric:        Mood and Affect: Mood normal.        Behavior: Behavior normal.     Palliative Assessment/Data: 30%   Assessment & Plan:   HPI/Patient Profile: 83 y.o.  male  with past medical history of stage I non-small cell left lung cancer and prostate cancer s/p SBRT now with recent findings of local recurrence in the left upper lobe, chronic pain, peripheral neuropathy, chronic leukocytosis, hypothyroidism, BPH, hypertension, GERD, hyperlipidemia, PAD, former smoker now in remission, history of radiation proctitis and rectal bleeding, insomnia who according to family has been complaining of progressive shortness of breath for the past several weeks.  He was admitted on 05/12/2024 with acute HFrEF, acute respiratory failure with hypoxia, lung cancer of the left upper lobe with new recurrence, and others.   Palliative medicine was consulted for GOC conversations.  SUMMARY OF RECOMMENDATIONS   Full code Full scope of care Family discussion with patient about CODE STATUS and GOC as his mental status improves toward baseline Time for outcomes Palliative medicine will follow-up on Monday when back on service  Symptom Management:  Per primary team Palliative medicine is available to assist as needed  Code Status: Full Code  Prognosis:  Unable to determine  Discharge Planning:  To Be Determined   Discussed with: Patient, family, medical team, nursing team    Thank you for allowing us  to participate in the care of XABI WITTLER PMT will continue to support holistically.  Time Total: 75 min  Detailed review of medical records (labs, imaging, vital signs), medically appropriate exam, discussed with treatment team, counseling and education to patient, family, & staff, documenting clinical information, medication management, coordination of care  Signed  by: [name] Kays, NP Palliative Medicine Team  Team Phone # [phone] (Nights/Weekends)  05/12/2024, 2:33 PM

## 2024-05-13 ENCOUNTER — Inpatient Hospital Stay (HOSPITAL_COMMUNITY)

## 2024-05-13 DIAGNOSIS — I5021 Acute systolic (congestive) heart failure: Secondary | ICD-10-CM | POA: Diagnosis not present

## 2024-05-13 DIAGNOSIS — C4492 Squamous cell carcinoma of skin, unspecified: Secondary | ICD-10-CM | POA: Diagnosis not present

## 2024-05-13 DIAGNOSIS — C3412 Malignant neoplasm of upper lobe, left bronchus or lung: Secondary | ICD-10-CM | POA: Diagnosis not present

## 2024-05-13 DIAGNOSIS — I350 Nonrheumatic aortic (valve) stenosis: Secondary | ICD-10-CM | POA: Diagnosis not present

## 2024-05-13 DIAGNOSIS — I352 Nonrheumatic aortic (valve) stenosis with insufficiency: Secondary | ICD-10-CM | POA: Diagnosis not present

## 2024-05-13 LAB — BASIC METABOLIC PANEL WITH GFR
Anion gap: 7 (ref 5–15)
BUN: 20 mg/dL (ref 8–23)
CO2: 31 mmol/L (ref 22–32)
Calcium: 8.1 mg/dL — ABNORMAL LOW (ref 8.9–10.3)
Chloride: 100 mmol/L (ref 98–111)
Creatinine, Ser: 1.01 mg/dL (ref 0.61–1.24)
GFR, Estimated: >60 mL/min
Glucose, Bld: 166 mg/dL — ABNORMAL HIGH (ref 70–99)
Potassium: 3.7 mmol/L (ref 3.5–5.1)
Sodium: 138 mmol/L (ref 135–145)

## 2024-05-13 LAB — MAGNESIUM: Magnesium: 2.7 mg/dL — ABNORMAL HIGH (ref 1.7–2.4)

## 2024-05-13 LAB — PRO BRAIN NATRIURETIC PEPTIDE: Pro Brain Natriuretic Peptide: 5125 pg/mL — ABNORMAL HIGH

## 2024-05-13 LAB — LIPID PANEL
Cholesterol: 94 mg/dL (ref 0–200)
HDL: 28 mg/dL — ABNORMAL LOW
LDL Cholesterol: 49 mg/dL (ref 0–99)
Total CHOL/HDL Ratio: 3.3 ratio
Triglycerides: 84 mg/dL
VLDL: 17 mg/dL (ref 0–40)

## 2024-05-13 MED ORDER — DOCUSATE SODIUM 100 MG PO CAPS
100.0000 mg | ORAL_CAPSULE | Freq: Two times a day (BID) | ORAL | Status: DC
  Administered 2024-05-13 – 2024-05-14 (×3): 100 mg via ORAL
  Filled 2024-05-13 (×3): qty 1

## 2024-05-13 MED ORDER — SENNOSIDES-DOCUSATE SODIUM 8.6-50 MG PO TABS
2.0000 | ORAL_TABLET | Freq: Every day | ORAL | Status: DC
  Administered 2024-05-13: 2 via ORAL
  Filled 2024-05-13: qty 2

## 2024-05-13 MED ORDER — FUROSEMIDE 10 MG/ML IJ SOLN
30.0000 mg | Freq: Two times a day (BID) | INTRAMUSCULAR | Status: DC
  Administered 2024-05-13 – 2024-05-14 (×2): 30 mg via INTRAVENOUS
  Filled 2024-05-13 (×2): qty 4

## 2024-05-13 MED ORDER — ALUM & MAG HYDROXIDE-SIMETH 200-200-20 MG/5ML PO SUSP
30.0000 mL | Freq: Four times a day (QID) | ORAL | Status: DC | PRN
  Administered 2024-05-13 – 2024-05-14 (×2): 30 mL via ORAL
  Filled 2024-05-13 (×2): qty 30

## 2024-05-13 MED ORDER — IPRATROPIUM-ALBUTEROL 0.5-2.5 (3) MG/3ML IN SOLN
3.0000 mL | RESPIRATORY_TRACT | Status: DC | PRN
  Administered 2024-05-14: 3 mL via RESPIRATORY_TRACT
  Filled 2024-05-13: qty 3

## 2024-05-13 MED ORDER — BISACODYL 10 MG RE SUPP: 10.0000 mg | Freq: Every day | RECTAL | Status: DC | PRN

## 2024-05-13 NOTE — Assessment & Plan Note (Signed)
 Extensive oncology history below.  S/p SBRT   - Most recent CT scan shows enlargement in the previous SBRT site along with probably some lymph node involvement. - PET scan showed enlarging left upper lobe mass demonstrating intense hypermetabolic activity consistent with local recurrence of lung cancer.  No evidence of metastatic disease. - [facility] does not believe he can be reradiated or have surgery. -They are awaiting additional biopsy results to determine if there is any actionable mutation.  PD-L1 was 1%. -At this point, would recommend waiting for results and continuing antibiotics for pneumonia and improvement from his CHF. - Dr. Davonna will also be seeing patient and cosign this note.

## 2024-05-13 NOTE — Evaluation (Signed)
 Occupational Therapy Evaluation Patient Details Name: Calvin Moreno MRN: [id number] DOB: [birth year] Today's Date: 05/13/2024   History of Present Illness   MAICO MULVEHILL is a 83 year old gentleman with history of stage I non-small cell left lung cancer and prostate cancer s/p SBRT now with recent findings of local recurrence in the left upper lobe, chronic pain, peripheral neuropathy, chronic leukocytosis, hypothyroidism, BPH, hypertension, GERD, hyperlipidemia, PAD, former smoker now in remission, history of radiation proctitis and rectal bleeding, insomnia who according to family has been complaining of progressive shortness of breath for the past several weeks.  He does not use oxygen at home.  He he had an echocardiogram done on 05/03/2024 with findings of LVEF 25 to 30% down from 50 to 55% with severe AS and regional wall motion abnormalities.  Family says they have been trying to get in to see his cardiologist but has been having a difficult time getting an appointment.  Patient has had some increasing peripheral edema and weight gain.  He denies chest pain.  His main complaint has been shortness of breath.  He denies fever and chills.  He has had increasing cough and chest congestion.  He had a recent reported viral illness that lasted for 1/2-day and then resolved.  He saw [facility] oncology 05/11/2024 and they have explained to the patient that his lung cancer has progressed locally and any therapy would be palliative and not curative.  Patient was supposed to see Dr. Davonna at the [facility] later today for second opinion.  He presented to the ED due to progressive shortness of breath.  His ED work up significant for acute heart failure with bilateral pleural effusions, right upper lobe and left lower lobe consolidation likely pneumonia, negative for pulmonary embolus, new oxygen requirement at 2 L/min, tachypnea and tachycardia.  Admission was requested for management of acute heart failure  and acute respiratory failure with hypoxia with new oxygen requirements. (per MD)     Clinical Impressions Pt agreeable to OT and PT co-evaluation. Pt quick to fatigue, but able to complete bed mobility and functional ambulation in the room with CGA. Pt used personal SPC during transfers. B UE limited in A/ROM slightly, but demonstrates good strength. Pt required frequent rest breaks after activities today. Pt uses a sock aid at baseline and likely at level of set up to [facility] for most ADL's. Pt left in the chair with call bell within reach and on room air. Pt will benefit from continued OT in the hospital to increase strength, balance, and endurance for safe ADL's.        If plan is discharge home, recommend the following:   A little help with walking and/or transfers;A little help with bathing/dressing/bathroom;Assistance with cooking/housework;Assist for transportation;Help with stairs or ramp for entrance     Functional Status Assessment   Patient has had a recent decline in their functional status and demonstrates the ability to make significant improvements in function in a reasonable and predictable amount of time.     Equipment Recommendations   None recommended by OT             Precautions/Restrictions   Precautions Precautions: Fall Recall of Precautions/Restrictions: Intact Restrictions Weight Bearing Restrictions Per Provider Order: No     Mobility Bed Mobility Overal bed mobility: Needs Assistance Bed Mobility: Supine to Sit     Supine to sit: Supervision, Contact guard, HOB elevated     General bed mobility comments: mild labored effort  Transfers Overall transfer level: Needs assistance Equipment used: Straight cane Transfers: Sit to/from Stand, Bed to chair/wheelchair/BSC Sit to Stand: Contact guard assist     Step pivot transfers: Contact guard assist     General transfer comment: EOB to chair with SPC      Balance Overall balance  assessment: Needs assistance Sitting-balance support: No upper extremity supported, Feet supported Sitting balance-Leahy Scale: Good Sitting balance - Comments: seated at EOB   Standing balance support: During functional activity, Single extremity supported Standing balance-Leahy Scale: Fair Standing balance comment: using SPC                           ADL either performed or assessed with clinical judgement   ADL Overall ADL's : Needs assistance/impaired     Grooming: Set up;Sitting   Upper Body Bathing: Set up;Sitting   Lower Body Bathing: Set up;Sitting/lateral leans   Upper Body Dressing : Set up;Sitting   Lower Body Dressing: Set up;Sitting/lateral leans Lower Body Dressing Details (indicate cue type and reason): Uses sock aid at baseline. Toilet Transfer: Contact guard assist;Ambulation;Stand-pivot (cane) Toilet Transfer Details (indicate cue type and reason): EOB to chair and ambulation in the room. Toileting- Clothing Manipulation and Hygiene: Contact guard assist;Sitting/lateral lean;Sit to/from stand       Functional mobility during ADLs: Contact guard assist;Cane General ADL Comments: Able to ambulate ~20 feet in the room with SPC.     Vision Baseline Vision/History: 1 Wears glasses Ability to See in Adequate Light: 1 Impaired Patient Visual Report: No change from baseline Vision Assessment?: No apparent visual deficits     Perception Perception: Not tested       Praxis Praxis: Not tested       Pertinent Vitals/Pain Pain Assessment Pain Assessment: Faces Faces Pain Scale: Hurts a little bit Pain Location: stomach Pain Descriptors / Indicators: Discomfort Pain Intervention(s): Monitored during session     Extremity/Trunk Assessment Upper Extremity Assessment Upper Extremity Assessment: Generalized weakness (Slightly limited A/ROM near end range bilaterally. WFL strength.)   Lower Extremity Assessment Lower Extremity Assessment: Defer  to PT evaluation   Cervical / Trunk Assessment Cervical / Trunk Assessment: Kyphotic   Communication Communication Communication: No apparent difficulties   Cognition Arousal: Alert Behavior During Therapy: WFL for tasks assessed/performed Cognition: No apparent impairments                               Following commands: Intact       Cueing  General Comments   Cueing Techniques: Verbal cues;Visual cues  O2 saturation remaining >90% without supplemental O2 throughout the session.              Home Living Family/patient expects to be discharged to:: Private residence Living Arrangements: Spouse/significant other Available Help at Discharge: Family;Available 24 hours/day Type of Home: House Home Access: Stairs to enter [employer] of Steps: 2 Entrance Stairs-Rails: Right Home Layout: One level     Bathroom Shower/Tub: Producer, Television/film/video: Standard Bathroom Accessibility: Yes How Accessible: Accessible via walker Home Equipment: Rolling Walker (2 wheels);Cane - single point;Shower seat - built in;Grab bars - tub/shower          Prior Functioning/Environment Prior Level of Function : Needs assist       Physical Assist : ADLs (physical)   ADLs (physical): IADLs Mobility Comments: Community ambulator with SPC. ADLs Comments: Independent ADL's; assist  IADL's.    OT Problem List: Cardiopulmonary status limiting activity;Decreased activity tolerance;Decreased range of motion;Impaired balance (sitting and/or standing)   OT Treatment/Interventions: Self-care/ADL training;Therapeutic exercise;Therapeutic activities;Patient/family education;Balance training;Energy conservation      OT Goals(Current goals can be found in the care plan section)   Acute Rehab OT Goals Patient Stated Goal: Improve function. OT Goal Formulation: With patient Time For Goal Achievement: 05/27/24 Potential to Achieve Goals: Good   OT  Frequency:  Min 1X/week    Co-evaluation PT/OT/SLP Co-Evaluation/Treatment: Yes Reason for Co-Treatment: To address functional/ADL transfers   OT goals addressed during session: ADL's and self-care                       End of Session Equipment Utilized During Treatment: Gait belt;Other (comment) [facility]) Nurse Communication: Mobility status;Other (comment) (O2 status.)  Activity Tolerance: Patient tolerated treatment well Patient left: in chair;with call bell/phone within reach;with family/visitor present  OT Visit Diagnosis: Unsteadiness on feet (R26.81);Other abnormalities of gait and mobility (R26.89);Muscle weakness (generalized) (M62.81)                Time: 9098-9076 OT Time Calculation (min): 22 min Charges:  OT General Charges $OT Visit: 1 Visit OT Evaluation $OT Eval Low Complexity: 1 Low  Cordon Gassett OT, MOT  Jayson Person 05/13/2024, 10:56 AM

## 2024-05-13 NOTE — Plan of Care (Signed)
" °  Problem: Acute Rehab PT Goals(only PT should resolve) Goal: Pt Will Go Supine/Side To Sit Outcome: Progressing Flowsheets (Taken 05/13/2024 1231) Pt will go Supine/Side to Sit: with supervision Goal: Patient Will Transfer Sit To/From Stand Outcome: Progressing Flowsheets (Taken 05/13/2024 1231) Patient will transfer sit to/from stand: with supervision Goal: Pt Will Transfer Bed To Chair/Chair To Bed Outcome: Progressing Flowsheets (Taken 05/13/2024 1231) Pt will Transfer Bed to Chair/Chair to Bed: with contact guard assist Goal: Pt Will Ambulate Outcome: Progressing Flowsheets (Taken 05/13/2024 1231) Pt will Ambulate:  50 feet  with supervision  with cane Goal: Pt Will Go Up/Down Stairs Outcome: Progressing Flowsheets (Taken 05/13/2024 1231) Pt will Go Up / Down Stairs:  1-2 stairs  with rail(s)  with supervision   12:32 PM, 05/13/2024 Rosaria Settler, PT, DPT [place] with [facility]  "

## 2024-05-13 NOTE — Progress Notes (Signed)
 SPIRITUAL CARE AND COUNSELING CONSULT NOTE   VISIT SUMMARY   Reason for Visit: Chaplain responding to referral from RN that Pt and family had been given difficult news regarding his health and suggest Pt could use another payer of support.  Description of Visit: Upon entering the room I found Calvin Moreno in the bed and at this time there was no support person present, but there were signs that family was there with him recently.   I introduced myself to [place] and shared that the RN had advised me about the difficult prognosis the Dr had given (I was told Pt was being give approx 6 mos).  Pt shared his feeling of overwhelm- it was just a lot to process and he was still in a place of indecision.  He often repeated the phrase I just have to believe that the Bible is true and rest my faith there.  I explored that statement with Pt and essentially he is committing his life to God's hands both now and for what follows after death.   Pt's son and daughter have both been involved during his hospitalization, and Pt also mentioned a wife at home as well.  When I asked him if he felt like his voice was being heard in the plans being made he replied Both yes and no is all I can say about that. He did not elaborate further.   Plan of Care: I will plan to share this Pt's case with my colleague and allow her to continue to follow up with Pt and family.  SPIRITUAL ENCOUNTER                                                                                                                                                                      Type of Visit: Initial Care provided to:: Patient Referral source: Nurse (RN/NT/LPN) Reason for visit: Routine spiritual support OnCall Visit: No   SPIRITUAL FRAMEWORK  Presenting Themes: Meaning/purpose/sources of inspiration, Goals in life/care, Values and beliefs, Impactful experiences and emotions, Rituals and practive Values/beliefs: [name] states that his a  Christian; he also values family highly- particularlly his great granddaughter Community/Connection: Family Strengths: Spirituality; Hope Needs/Challenges/Barriers: Overwhelmed with new information about his cancer and his heart Patient Stress Factors: Exhausted, Major life changes, Health changes Family Stress Factors: Health changes   GOALS   Self/Personal Goals: Pt states he is unsure about what he wants Clinical Care Goals: Create space condusive for spiritual care; affirm dignity and EOL choices   INTERVENTIONS   Spiritual Care Interventions Made: Established relationship of care and support, Reflective listening, Compassionate presence, Decision-making support/facilitation, Narrative/life review, Explored values/beliefs/practices/strengths, Prayer    INTERVENTION OUTCOMES   Outcomes: Connection to spiritual care, Reduced isolation, Reduced anxiety, Awareness of  support  SPIRITUAL CARE PLAN   Spiritual Care Issues Still Outstanding: Referring to oncoming chaplain for further support    If immediate needs arise, please consult Zelda Salmon AMION schedule for chaplain coverage  Maude Roll, MDiv Chaplain, [facility] [email or web address] [phone] 05/13/2024 12:33 PM

## 2024-05-13 NOTE — Progress Notes (Signed)
" °   05/13/24 0728  ReDS Vest / Clip  Station Marker C  Ruler Value 35  ReDS Value Range (!) > 40  ReDS Actual Value 51  Anatomical Comments patient sitting up in bed    "

## 2024-05-13 NOTE — Progress Notes (Signed)
 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  He does not use oxygen at home.  He he had an echocardiogram done on 05/03/2024 with findings of LVEF 25 to 30% down from 50 to 55% with severe AS and regional wall motion abnormalities.  Family says they have been trying to get in to see his cardiologist but has been having a difficult time getting an appointment.  Patient has had some increasing peripheral edema and weight gain.  He denies chest pain.  His main complaint has been shortness of breath.  He denies fever and chills.  He has had increasing cough and chest congestion.  He had a recent reported viral illness that lasted for 1/2-day and then resolved.  He saw [facility] oncology 05/11/2024 and they have explained to the patient that his lung cancer has progressed locally and any therapy would be palliative and not curative.  Patient was supposed to see Dr. Davonna at the [facility] later today for second opinion.  He presented to the ED due to progressive shortness of breath.  His ED work up significant for acute heart failure with bilateral pleural effusions, right upper lobe and left lower lobe consolidation likely pneumonia, negative for pulmonary embolus, new oxygen requirement at 2 L/min, tachypnea and tachycardia.  Admission was  requested for management of acute heart failure and acute respiratory failure with hypoxia with new oxygen requirements.   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 with recent TTE showing LVEF down to 25-30% with severe AS and regional wall motion abnormalities  -- family reports that they had been trying to get him an outpatient appt with cardiology but having a difficult time and requested an inpatient consult -- will request an inpatient consult  -- agree with IV furosemide  for diuresis -- heart failure inpatient orderset utilized -- trial of slow go GDMT as tolerated given his advanced age -- further recommendations to follow -- continue diuresis for now as BP tolerates -- entresto  held due to hypotension -- Reds Vest reading: 40    Filed Weights   05/12/24 0447 05/12/24 1253 05/13/24 0434  Weight: 72.6 kg 68.8 kg 74.1 kg    Intake/Output Summary (Last 24 hours) at 05/13/2024 1326 Last data filed at 05/13/2024 1024 Gross per 24 hour  Intake 550 ml  Output 1450 ml  Net -900 ml   Acute respiratory failure with hypoxia -- new oxygen requirement -- he is feeling better on supplemental oxygen -- secondary to acute heart failure, pneumonia, lung cancer recurrence  -- home O2 eval will be done prior to discharge   Lung cancer LUL with new recurrence  -- pt seen by [facility] oncology yesterday for 2nd opinion and palliative treatment offered, not curative -- pt wanting another opinion from [facility] for another opinion -- I contacted [facility] and  they are agreeable to consult--Dr. Davonna agreed with recommendations from [facility] oncology and life expectancy of 6 months would not be surprising given advanced heart disease and recurrent lung cancer. They do not feel like he can be reradiated or have surgery.  [facility] is awaiting additional biopsy results to determine if there is any actionable mutation.   -- palliative medicine consult for goals of  care requested    Essential hypertension -- blood pressure soft but stable   Hypothyroidism -- resume home meds when reconciled   BPH -- resume home meds, bladder scan every shift -- monitor for acute urinary retention on furosemide    History of prostate cancer -- he was diagnosed in 2012, s/p radiation treatment at [facility]   Adult failure to thrive  -- appreciate palliative consult  -- pt seems unlikely candidate for TAVR or other advanced HF therapies -- working closely with family to provide them the information  DVT prophylaxis: enoxaparin  Code Status: Full  Family Communication: bedside updates 12/18-19 Disposition: TBD    Consultants:  Cardiology  Palliative care chaplain  Procedures:   Antimicrobials:    Subjective: Pt hard of hearing but reports overall he was able to eat breakfast and his pain is better controlled.   Objective: Vitals:   05/13/24 1000 05/13/24 1100 05/13/24 1119 05/13/24 1200  BP: 98/61 95/63  (!) 112/55  Pulse: (!) 113 (!) 110    Resp: (!) 21 (!) 21    Temp:   98 F (36.7 C)   TempSrc:   Axillary   SpO2: 92% 91%    Weight:      Height:        Intake/Output Summary (Last 24 hours) at 05/13/2024 1319 Last data filed at 05/13/2024 1024 Gross per 24 hour  Intake 550 ml  Output 1450 ml  Net -900 ml   Filed Weights   05/12/24 0447 05/12/24 1253 05/13/24 0434  Weight: 72.6 kg 68.8 kg 74.1 kg   Examination:  General exam: hard of hearing.  Appears calm and comfortable  Respiratory system: shallow breathing. Cardiovascular system: normal S1 & S2 heard. mild JVD, harsh systolic murmur, rubs, No gallops or clicks. 1+ pedal edema. Gastrointestinal system: Abdomen is distended, soft and nontender. No organomegaly or masses felt. Normal bowel sounds heard. Central nervous system: Alert and oriented. No focal neurological deficits. Extremities: Symmetric 5 x 5 power. Skin: age-related changes. No rashes, lesions or  ulcers. Psychiatry: Judgement and insight appear normal. Mood & affect appropriate.   Data Reviewed: I have personally reviewed following labs and imaging studies  CBC: Recent Labs  Lab 05/12/24 0442  WBC 16.0*  NEUTROABS 14.4*  HGB 13.5  HCT 43.0  MCV 88.7  PLT 523*    Basic Metabolic Panel: Recent Labs  Lab 05/12/24 0442 05/13/24 0456  NA 139 138  K 4.7 3.7  CL 100 100  CO2 30 31  GLUCOSE 202* 166*  BUN 20 20  CREATININE 1.11 1.01  CALCIUM 9.1 8.1*  MG  --  2.7*    CBG: No results for input(s): GLUCAP in the last 168 hours.  Recent Results (from the past 240 hours)  Resp panel by RT-PCR (RSV, Flu A&B, Covid) Anterior Nasal Swab     Status: None   Collection Time: 05/12/24  5:25 AM   Specimen: Anterior Nasal Swab  Result Value Ref Range Status   SARS Coronavirus 2 by RT PCR NEGATIVE NEGATIVE Final    Comment: (NOTE) SARS-CoV-2 target nucleic acids are NOT  DETECTED.  The SARS-CoV-2 RNA is generally detectable in upper respiratory specimens during the acute phase of infection. The lowest concentration of SARS-CoV-2 viral copies this assay can detect is 138 copies/mL. A negative result does not preclude SARS-Cov-2 infection and should not be used as the sole basis for treatment or other patient management decisions. A negative result may occur with  improper specimen collection/handling, submission of specimen other than nasopharyngeal swab, presence of viral mutation(s) within the areas targeted by this assay, and inadequate number of viral copies(<138 copies/mL). A negative result must be combined with clinical observations, patient history, and epidemiological information. The expected result is Negative.  Fact Sheet for Patients:  bloggercourse.com  Fact Sheet for Healthcare Providers:  seriousbroker.it  This test is no t yet approved or cleared by the [place]  FDA and  has been authorized for  detection and/or diagnosis of SARS-CoV-2 by FDA under an Emergency Use Authorization (EUA). This EUA will remain  in effect (meaning this test can be used) for the duration of the COVID-19 declaration under Section 564(b)(1) of the Act, 21 U.S.C.section 360bbb-3(b)(1), unless the authorization is terminated  or revoked sooner.       Influenza A by PCR NEGATIVE NEGATIVE Final   Influenza B by PCR NEGATIVE NEGATIVE Final    Comment: (NOTE) The Xpert Xpress SARS-CoV-2/FLU/RSV plus assay is intended as an aid in the diagnosis of influenza from Nasopharyngeal swab specimens and should not be used as a sole basis for treatment. Nasal washings and aspirates are unacceptable for Xpert Xpress SARS-CoV-2/FLU/RSV testing.  Fact Sheet for Patients: bloggercourse.com  Fact Sheet for Healthcare Providers: seriousbroker.it  This test is not yet approved or cleared by the [place]  FDA and has been authorized for detection and/or diagnosis of SARS-CoV-2 by FDA under an Emergency Use Authorization (EUA). This EUA will remain in effect (meaning this test can be used) for the duration of the COVID-19 declaration under Section 564(b)(1) of the Act, 21 U.S.C. section 360bbb-3(b)(1), unless the authorization is terminated or revoked.     Resp Syncytial Virus by PCR NEGATIVE NEGATIVE Final    Comment: (NOTE) Fact Sheet for Patients: bloggercourse.com  Fact Sheet for Healthcare Providers: seriousbroker.it  This test is not yet approved or cleared by the [place]  FDA and has been authorized for detection and/or diagnosis of SARS-CoV-2 by FDA under an Emergency Use Authorization (EUA). This EUA will remain in effect (meaning this test can be used) for the duration of the COVID-19 declaration under Section 564(b)(1) of the Act, 21 U.S.C. section 360bbb-3(b)(1), unless the authorization is  terminated or revoked.  Performed at [facility], [address], [place], [place] [address]   Culture, blood (routine x 2)     Status: None (Preliminary result)   Collection Time: 05/12/24  6:37 AM   Specimen: BLOOD  Result Value Ref Range Status   Specimen Description BLOOD BLOOD LEFT ARM  Final   Special Requests   Final    Blood Culture adequate volume BOTTLES DRAWN AEROBIC AND ANAEROBIC   Culture   Final    NO GROWTH 1 DAY Performed at [facility], [address] [place], [place] [address]    Report Status PENDING  Incomplete  Culture, blood (routine x 2)     Status: None (Preliminary result)   Collection Time: 05/12/24  6:37 AM   Specimen: BLOOD  Result Value Ref Range Status   Specimen Description BLOOD BLOOD LEFT HAND  Final   Special Requests  Final    BOTTLES DRAWN AEROBIC AND ANAEROBIC Blood Culture adequate volume   Culture   Final    NO GROWTH 1 DAY Performed at [facility], [address], [place], [place] [address]    Report Status PENDING  Incomplete  MRSA Next Gen by PCR, Nasal     Status: None   Collection Time: 05/12/24 12:44 PM   Specimen: Nasal Mucosa; Nasal Swab  Result Value Ref Range Status   MRSA by PCR Next Gen NOT DETECTED NOT DETECTED Final    Comment: (NOTE) The GeneXpert MRSA Assay (FDA approved for NASAL specimens only), is one component of a comprehensive MRSA colonization surveillance program. It is not intended to diagnose MRSA infection nor to guide or monitor treatment for MRSA infections. Test performance is not FDA approved in patients less than 67 years old. Performed at [facility], [address] [place], [place] [address]      Radiology Studies: CT Angio Chest Pulmonary Embolism (PE) W or WO Contrast Result Date: 05/12/2024 EXAM: CTA CHEST 05/12/2024 07:21:54 AM TECHNIQUE: CTA of the chest was performed after the administration of intravenous contrast. Multiplanar reformatted images are provided for review. MIP images are  provided for review. Automated exposure control, iterative reconstruction, and/or weight based adjustment of the mA/kV was utilized to reduce the radiation dose to as low as reasonably achievable. COMPARISON: CT of the chest dated 04/06/2024. CLINICAL HISTORY: sob FINDINGS: PULMONARY ARTERIES: Pulmonary arteries are adequately opacified for evaluation. No acute pulmonary embolus. The right main pulmonary artery is dilated, measuring approximately 3.0 cm in diameter. MEDIASTINUM: The heart and pericardium demonstrate no acute abnormality. There is calcific coronary artery disease present. There is also calcification of the mitral annulus. The thoracic aorta is normal in caliber, but demonstrates mild-to-moderate calcific atheromatous disease. LYMPH NODES: No mediastinal, hilar or axillary lymphadenopathy. LUNGS AND PLEURA: There has been significant interval worsening of opacification/consolidation of the left upper lobe, enveloping the ovoid mass noted within the left upper lobe on the previous study. There is also patchy consolidation of the right upper lobe and superior segment of the right lower lobe. There is also patchy opacification/consolidation of the superior segment of the left lower lobe. Since the previous study, the patient has developed moderate bilateral pleural effusions. No pneumothorax. UPPER ABDOMEN: Limited images of the upper abdomen are unremarkable. SOFT TISSUES AND BONES: No acute bone or soft tissue abnormality. IMPRESSION: 1. No evidence of pulmonary embolus. 2. Significant interval worsening of opacification/consolidation of the left upper lobe, enveloping the ovoid mass noted within the left upper lobe on the previous study. 3. Patchy consolidation of the right upper lobe and superior segment of the right lower lobe. 4. Patchy opacification/consolidation of the superior segment of the left lower lobe. 5. Moderate bilateral pleural effusions, new since the previous study. 6. Dilated right  main pulmonary artery, measuring approximately 3.0 cm in diameter, which can be seen with pulmonary arterial hypertension. 7. Calcific coronary artery disease and mitral annular calcification. Electronically signed by: Evalene Coho MD 05/12/2024 07:50 AM EST RP Workstation: [id number]   DG Chest 1 View Result Date: 05/12/2024 EXAM: 1 VIEW(S) XRAY OF THE CHEST 05/12/2024 05:09:00 AM COMPARISON: PET CT 04/28/2024 CLINICAL HISTORY: SOB FINDINGS: LUNGS AND PLEURA: Right mid lung perihilar opacity is new from the previous exam. Left upper lobe mass. Moderate diffuse interstitial edema. Small right pleural effusion. No pneumothorax. HEART AND MEDIASTINUM: Aortic calcification. BONES AND SOFT TISSUES: No acute osseous abnormality. IMPRESSION: 1. Moderate diffuse interstitial edema.  2. Small right pleural effusion. 3. New right mid lung perihilar opacity, compatible with pneumonia . 4. Left upper lobe mass, as described on the comparison PET CT from 04/28/2024 . Electronically signed by: Taylor Stroud MD 05/12/2024 06:02 AM EST RP Workstation: [id number]   Scheduled Meds:  ALPRAZolam   0.5 mg Oral QHS   Chlorhexidine  Gluconate Cloth  6 each Topical Q0600   docusate sodium   100 mg Oral BID   doxycycline   100 mg Oral Q12H   DULoxetine   60 mg Oral QPM   enoxaparin  (LOVENOX ) injection  40 mg Subcutaneous Q24H   finasteride   5 mg Oral Daily   furosemide   40 mg Intravenous Q12H   gabapentin   100 mg Oral QHS   levothyroxine   25 mcg Oral Q0600   pantoprazole   40 mg Oral QAC breakfast   senna-docusate  2 tablet Oral Q1200   simvastatin   20 mg Oral QPM   sodium chloride  flush  3 mL Intravenous Q12H   spironolactone   12.5 mg Oral Daily   tamsulosin   0.4 mg Oral QPC supper   Continuous Infusions:  cefTRIAXone  (ROCEPHIN )  IV Stopped (05/13/24 0811)    LOS: 1 day   Critical Care Procedure Note Authorized and Performed by: KYM Louder MD  Total Critical Care time:  57 mins  Due to a high probability of  clinically significant, life threatening deterioration, the patient required my highest level of preparedness to intervene emergently and I personally spent this critical care time directly and personally managing the patient.  This critical care time included obtaining a history; examining the patient, pulse oximetry; ordering and review of studies; arranging urgent treatment with development of a management plan; evaluation of patient's response of treatment; frequent reassessment; and discussions with other providers.  This critical care time was performed to assess and manage the high probability of imminent and life threatening deterioration that could result in multi-organ failure.  It was exclusive of separately billable procedures and treating other patients and teaching time.   Afton Louder, MD How to contact the [facility] Attending or Consulting provider 7A - 7P or covering provider during after hours 7P -7A, for this patient?  Check the care team in [facility] and look for a) attending/consulting [facility] provider listed and b) the [facility] team listed Log into [email or web address] to find provider on call.  Locate the [facility] provider you are looking for under [facility] and page to a number that you can be directly reached. If you still have difficulty reaching the provider, please page the [facility] (Director on Call) for the Hospitalists listed on amion for assistance.  05/13/2024, 1:19 PM    "

## 2024-05-13 NOTE — Plan of Care (Signed)
" °  Problem: Acute Rehab OT Goals (only OT should resolve) Goal: Pt. Will Perform Lower Body Dressing Flowsheets (Taken 05/13/2024 1058) Pt Will Perform Lower Body Dressing: with modified independence Goal: Pt. Will Transfer To Toilet Flowsheets (Taken 05/13/2024 1058) Pt Will Transfer to Toilet: with modified independence Goal: Pt. Will Perform Toileting-Clothing Manipulation Flowsheets (Taken 05/13/2024 1058) Pt Will Perform Toileting - Clothing Manipulation and hygiene: with modified independence Goal: Pt/Caregiver Will Perform Home Exercise Program Flowsheets (Taken 05/13/2024 1058) Pt/caregiver will Perform Home Exercise Program:  Increased ROM  Both right and left upper extremity  Independently  Charlei Ramsaran OT, MOT  "

## 2024-05-13 NOTE — Progress Notes (Signed)
 SATURATION QUALIFICATIONS: (This note is used to comply with regulatory documentation for home oxygen)  Patient Saturations on Room Air at Rest = 86%  Patient Saturations on Room Air while Ambulating = 77%  Patient Saturations on 2 Liters of oxygen while Ambulating = 95%  Please briefly explain why patient needs home oxygen: Patient qualifies for oxygen. Patient's oxygenation demands increase with exertion and oxygen saturation levels decrease.

## 2024-05-13 NOTE — Progress Notes (Signed)
 "   Progress Note  Patient Name: Calvin Moreno Date of Encounter: 05/13/2024  Primary Cardiologist: Alvan Carrier, MD  Interval Summary  Chart reviewed.  Patient met with palliative care and oncology yesterday, I reviewed these notes.  He is sitting in a bedside chair this morning, states that he feels less tired today.  Did not sleep well.  I spoke with his son in the room as well.  Vital Signs  Vitals:   05/13/24 0800 05/13/24 0819 05/13/24 0900 05/13/24 1000  BP: 101/63  (!) 104/53 98/61  Pulse:   (!) 126 (!) 113  Resp:   (!) 36 (!) 21  Temp:      TempSrc:      SpO2:  94% 97% 92%  Weight:      Height:        Intake/Output Summary (Last 24 hours) at 05/13/2024 1109 Last data filed at 05/13/2024 1024 Gross per 24 hour  Intake 550 ml  Output 1450 ml  Net -900 ml   Filed Weights   05/12/24 0447 05/12/24 1253 05/13/24 0434  Weight: 72.6 kg 68.8 kg 74.1 kg    Physical Exam  GEN: No acute distress.   Neck: No JVD. Cardiac: RRR, 2/6 systolic murmur.  Respiratory: Nonlabored. Clear to auscultation bilaterally. GI: Soft, nontender, bowel sounds present. MS: Mild lower leg edema.  ECG/Telemetry  Telemetry reviewed showing sinus tachycardia and also runs of possible atypical atrial flutter.  Labs  Chemistry Recent Labs  Lab 05/12/24 0442 05/13/24 0456  NA 139 138  K 4.7 3.7  CL 100 100  CO2 30 31  GLUCOSE 202* 166*  BUN 20 20  CREATININE 1.11 1.01  CALCIUM 9.1 8.1*  GFRNONAA >60 >60  ANIONGAP 9 7    Hematology Recent Labs  Lab 05/12/24 0442  WBC 16.0*  RBC 4.85  HGB 13.5  HCT 43.0  MCV 88.7  MCH 27.8  MCHC 31.4  RDW 13.7  PLT 523*   Cardiac Enzymes Recent Labs  Lab 05/12/24 0442 05/12/24 0637  TRNPT 246* 223*    Lipid Panel     Component Value Date/Time   CHOL 94 05/13/2024 0456   TRIG 84 05/13/2024 0456   HDL 28 (L) 05/13/2024 0456   CHOLHDL 3.3 05/13/2024 0456   VLDL 17 05/13/2024 0456   LDLCALC 49 05/13/2024 0456     Cardiac Studies  Echocardiogram 05/03/2024:  1. No LV thrombus by Definity . Left ventricular ejection fraction, by  estimation, is 25 to 30%. Left ventricular ejection fraction by 3D volume  is 37 %. The left ventricle has severely decreased function. The left  ventricle demonstrates regional wall  motion abnormalities (see scoring diagram/findings for description). There  is mild left ventricular hypertrophy. Indeterminate diastolic filling due  to E-A fusion. The average left ventricular global longitudinal strain is  -3.3 %. The global longitudinal   strain is abnormal.   2. Right ventricular systolic function is normal. The right ventricular  size is normal. Tricuspid regurgitation signal is inadequate for assessing  PA pressure.   3. The mitral valve is abnormal. No evidence of mitral valve  regurgitation. Severe mitral annular calcification.   4. The aortic valve has an indeterminant number of cusps. There is severe  calcifcation of the aortic valve. Aortic valve regurgitation is moderate  to severe. Severe aortic valve stenosis, low flow-low gradient. Aortic  regurgitation PHT measures 231  msec. Aortic valve area, by VTI measures 0.43 cm. Aortic valve mean  gradient measures 28.0  mmHg. Aortic valve Vmax measures 3.48 m/s. DVI is  0.22 and SVI are 15.8, worse compared to prior echo.   5. The inferior vena cava is normal in size with greater than 50%  respiratory variability, suggesting right atrial pressure of 3 mmHg.   Assessment & Plan  1.  HFrEF, newly documented by echocardiogram on December 9, LVEF 25 to 30% range which has decreased from normal compared to study in April.  Wall motion abnormalities also described raising possibility of underlying CAD, however progressive aortic stenosis and regurgitation are likely major contributors.  Telemetry shows possible runs of atypical atrial flutter which may be an additional contributor to his cardiomyopathy.  He is not a  good candidate for anticoagulation however in the current setting and with history of GI bleeding and stercoral ulcer.  High-sensitivity troponin T levels are mildly elevated in relatively flat pattern more consistent with demand ischemia than ACS.   2.  Severe low-flow/low gradient aortic stenosis.  Mean AV gradient up to 28 mmHg with dimensionless index 0.22 by recent echocardiogram.  Also has associated moderate to severe aortic regurgitation.  I do not think that he is a good candidate for TAVR in light of present comorbidities, procedural risks and anticipated life expectancy.   3.  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  Treatment options being contemplated by oncology, although it sounds like ultimate goal would be palliation rather than expected cure.  Per my discussion with oncology, best estimate for life expectancy without treatment would be 6 months to 1 year.  It is not clear that he is a good candidate for chemoimmunotherapy.   4.  Possible pneumonia based on chest CT as discussed above.  On antibiotics per primary team.  Also has pleural effusions on diuretic therapy.   5.  Left bundle branch block, old.   6.  Prior history of GI bleeding stercoral ulcer.  Overall complex situation particularly in light of comorbid illnesses.  I had a long discussion with the patient and his son today.  I have reached out to Dr. Davonna who plans to follow-up with the patient and his son today.  I have also spoken with Dr. Wonda and asked him to the review the patient's chart and then speak with the patient's son regarding TAVR candidacy.  My opinion is that he is not a good candidate for TAVR.  Treatment options will obviously be limited and this may be a much more reasonable situation to consider palliative care.  Hopefully with more directed guidance and information, the patient and his family will be more  comfortable making decisions about anticipated treatment course going forward.  I realize that this is difficult for all involved.  For questions or updates, please contact [facility] Please consult [email or web address] for contact info under   Signed, Jayson Sierras, MD  05/13/2024, 11:09 AM    "

## 2024-05-13 NOTE — Progress Notes (Signed)
 Oncology progress note   I had a detailed discussion with patient and his son regarding the treatment plan and prognosis.  I reemphasized that patient does not have any curative options at this time for his lung cancer.  Palliative chemoimmunotherapy would be the treatment of choice for the patient.If patient's functional status improves, can consider chemotherapy with immunotherapy, with lower doses of chemotherapy and can assess for response and go from there.  The median overall survival with chemoimmunotherapy and PD-L1 less than 1% was 18.3 months.  But explained in detail that this is a trial population and with his age and comorbidities this may not be an accurate number to extrapolate for the patient.  If patient does not receive any treatment, he will have poor prognosis with a expected life span of 6 months to 1 year.  I spent a total of 30 mins with this interaction and documentation.  Mickiel Dry, MD Hematology/Oncology [facility]

## 2024-05-13 NOTE — Evaluation (Signed)
 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  He does not use oxygen at home.  He he had an echocardiogram done on 05/03/2024 with findings of LVEF 25 to 30% down from 50 to 55% with severe AS and regional wall motion abnormalities.  Family says they have been trying to get in to see his cardiologist but has been having a difficult time getting an appointment.  Patient has had some increasing peripheral edema and weight gain.  He denies chest pain.  His main complaint has been shortness of breath.  He denies fever and chills.  He has had increasing cough and chest congestion.  He had a recent reported viral illness that lasted for 1/2-day and then resolved.  He saw [facility] oncology 05/11/2024 and they have explained to the patient that his lung cancer has progressed locally and any therapy would be palliative and not curative.  Patient was supposed to see Dr. Davonna at the [facility] later today for second opinion.  He presented to the ED due to progressive shortness of breath.  His ED work up significant for acute heart failure with bilateral pleural effusions, right upper lobe and left lower lobe consolidation likely pneumonia, negative for pulmonary embolus, new oxygen requirement at 2 L/min, tachypnea and tachycardia.  Admission was requested for management of acute heart failure and  acute respiratory failure with hypoxia with new oxygen requirements.   Clinical Impression  Patient agreeable to OT/PT co-evaluation. Patients son or grandson is present during session and contributes to history. Report at baseline, pt is a tourist information centre manager with [employer] and independent with ADLs. This date, pt demonstrates much slow labored movement throughout bed mobility, functional tranfers, and short ambulation with SPC due to inc fatigue and general weakness. Pt is on RA during mobility and SpO2 remains above 90% throughout. Pt tolerates sitting in chair at end of session, call button in reach, all needs met, and family present. Nursing staff notified. Patient will benefit from continued skilled physical therapy acutely and in recommended venue in order to address current deficits to improve overall function.        If plan is discharge home, recommend the following: Assistance with cooking/housework;Assist for transportation;Help with stairs or ramp for entrance;A little help with walking and/or transfers   Can travel by private vehicle        Equipment Recommendations None recommended by PT  Recommendations for Other Services       Functional Status Assessment Patient has had a recent decline in their functional status and demonstrates the ability to make significant improvements in function in a reasonable and predictable amount of time.     Precautions / Restrictions Precautions Precautions: Fall Recall of Precautions/Restrictions: Intact Restrictions Weight Bearing Restrictions Per Provider Order: No      Mobility  Bed Mobility Overal bed mobility: Needs Assistance Bed Mobility: Supine to Sit     Supine to sit: Supervision, Contact guard, HOB elevated  General bed mobility comments: HOB slightly elevated, pt with slow labored movement, inc SOB but SpO2 remains WNL    Transfers Overall transfer level: Needs assistance Equipment used: Straight cane Transfers:  Sit to/from Stand, Bed to chair/wheelchair/BSC Sit to Stand: Contact guard assist   Step pivot transfers: Contact guard assist       General transfer comment: close CGA fro STS from bed and bed>chair due to slow labored movement, pt with SOB, SpO2 remains WNL, short step lengths and use of personal SPC    Ambulation/Gait Ambulation/Gait assistance: Contact guard assist Gait Distance (Feet): 20 Feet Assistive device: Straight cane Gait Pattern/deviations: Step-through pattern, Decreased step length - right, Decreased step length - left, Decreased stride length, Trunk flexed Gait velocity: Dec     General Gait Details: pt ambulates in room with SPC and close CGA, demo slow labored movement, pt with SOB, SpO2 remains WNL, generally mildly unsteady but without overt LOB, limited due to fatigue  Stairs            Wheelchair Mobility     Tilt Bed    Modified Rankin (Stroke Patients Only)       Balance Overall balance assessment: Needs assistance Sitting-balance support: No upper extremity supported, Feet supported Sitting balance-Leahy Scale: Good Sitting balance - Comments: seated at EOB   Standing balance support: During functional activity, Single extremity supported Standing balance-Leahy Scale: Fair Standing balance comment: using SPC                             Pertinent Vitals/Pain Pain Assessment Pain Assessment: Faces Faces Pain Scale: Hurts a little bit Pain Location: stomach, and some back pain Pain Descriptors / Indicators: Discomfort Pain Intervention(s): Limited activity within patient's tolerance, Repositioned, Monitored during session    Home Living Family/patient expects to be discharged to:: Private residence Living Arrangements: Spouse/significant other Available Help at Discharge: Family;Available 24 hours/day Type of Home: House Home Access: Stairs to enter Entrance Stairs-Rails: Right Entrance Stairs-Number of Steps: 2    Home Layout: One level Home Equipment: Agricultural Consultant (2 wheels);Cane - single point;Shower seat - built in;Grab bars - tub/shower      Prior Function Prior Level of Function : Needs assist       Physical Assist : ADLs (physical)   ADLs (physical): IADLs Mobility Comments: Community ambulator with SPC, one fall last year ADLs Comments: Independent ADL's; assist IADL's.     Extremity/Trunk Assessment   Upper Extremity Assessment Upper Extremity Assessment: Defer to OT evaluation    Lower Extremity Assessment Lower Extremity Assessment: Generalized weakness (ankle DF MMT 4+/5, hip flexion 4-/5, both bilaterally)    Cervical / Trunk Assessment Cervical / Trunk Assessment: Kyphotic  Communication   Communication Communication: No apparent difficulties    Cognition Arousal: Alert Behavior During Therapy: WFL for tasks assessed/performed                             Following commands: Intact       Cueing Cueing Techniques: Verbal cues, Visual cues     General Comments General comments (skin integrity, edema, etc.): O2 saturation remaining >90% without supplemental O2 throughout the session.    Exercises     Assessment/Plan    PT Assessment Patient needs continued PT services;All further PT needs can be met in the next venue of care  PT Problem List Decreased strength;Decreased activity tolerance;Decreased balance;Decreased mobility;Decreased  range of motion       PT Treatment Interventions DME instruction;Balance training;Gait training;Functional mobility training;Therapeutic activities;Therapeutic exercise;Patient/family education    PT Goals (Current goals can be found in the Care Plan section)  Acute Rehab PT Goals Patient Stated Goal: Return home PT Goal Formulation: With patient/family Time For Goal Achievement: 05/20/24 Potential to Achieve Goals: Good    Frequency Min 3X/week     Co-evaluation PT/OT/SLP Co-Evaluation/Treatment:  Yes Reason for Co-Treatment: To address functional/ADL transfers PT goals addressed during session: Mobility/safety with mobility OT goals addressed during session: ADL's and self-care       AM-PAC PT 6 Clicks Mobility  Outcome Measure Help needed turning from your back to your side while in a flat bed without using bedrails?: None Help needed moving from lying on your back to sitting on the side of a flat bed without using bedrails?: A Little Help needed moving to and from a bed to a chair (including a wheelchair)?: A Little Help needed standing up from a chair using your arms (e.g., wheelchair or bedside chair)?: A Little Help needed to walk in hospital room?: A Little Help needed climbing 3-5 steps with a railing? : A Little 6 Click Score: 19    End of Session Equipment Utilized During Treatment: Gait belt Activity Tolerance: Patient limited by fatigue;Patient tolerated treatment well Patient left: in chair;with call bell/phone within reach;with family/visitor present Nurse Communication: Mobility status PT Visit Diagnosis: History of falling (Z91.81);Muscle weakness (generalized) (M62.81);Other abnormalities of gait and mobility (R26.89);Unsteadiness on feet (R26.81)    Time: 9098-9076 PT Time Calculation (min) (ACUTE ONLY): 22 min   Charges:   PT Evaluation $PT Eval Low Complexity: 1 Low   PT General Charges $$ ACUTE PT VISIT: 1 Visit       12:30 PM, 05/13/2024 Reza Crymes Powell-Butler, PT, DPT  with [facility]

## 2024-05-14 ENCOUNTER — Other Ambulatory Visit: Payer: Self-pay | Admitting: Family Medicine

## 2024-05-14 DIAGNOSIS — C3412 Malignant neoplasm of upper lobe, left bronchus or lung: Secondary | ICD-10-CM | POA: Diagnosis not present

## 2024-05-14 DIAGNOSIS — I350 Nonrheumatic aortic (valve) stenosis: Secondary | ICD-10-CM | POA: Diagnosis not present

## 2024-05-14 DIAGNOSIS — E782 Mixed hyperlipidemia: Secondary | ICD-10-CM

## 2024-05-14 DIAGNOSIS — E039 Hypothyroidism, unspecified: Secondary | ICD-10-CM | POA: Diagnosis not present

## 2024-05-14 DIAGNOSIS — I5021 Acute systolic (congestive) heart failure: Secondary | ICD-10-CM | POA: Diagnosis not present

## 2024-05-14 DIAGNOSIS — I10 Essential (primary) hypertension: Secondary | ICD-10-CM | POA: Diagnosis not present

## 2024-05-14 LAB — BASIC METABOLIC PANEL WITH GFR
Anion gap: 8 (ref 5–15)
BUN: 19 mg/dL (ref 8–23)
CO2: 30 mmol/L (ref 22–32)
Calcium: 8.3 mg/dL — ABNORMAL LOW (ref 8.9–10.3)
Chloride: 100 mmol/L (ref 98–111)
Creatinine, Ser: 0.98 mg/dL (ref 0.61–1.24)
GFR, Estimated: >60 mL/min
Glucose, Bld: 164 mg/dL — ABNORMAL HIGH (ref 70–99)
Potassium: 3.5 mmol/L (ref 3.5–5.1)
Sodium: 138 mmol/L (ref 135–145)

## 2024-05-14 LAB — PRO BRAIN NATRIURETIC PEPTIDE: Pro Brain Natriuretic Peptide: 4483 pg/mL — ABNORMAL HIGH

## 2024-05-14 LAB — MAGNESIUM: Magnesium: 2.3 mg/dL (ref 1.7–2.4)

## 2024-05-14 MED ORDER — FUROSEMIDE 40 MG PO TABS: 40.0000 mg | ORAL_TABLET | Freq: Every day | ORAL | 1 refills | Status: DC

## 2024-05-14 MED ORDER — DOXYCYCLINE HYCLATE 100 MG PO CAPS: 100.0000 mg | ORAL_CAPSULE | Freq: Two times a day (BID) | ORAL | 0 refills | Status: AC

## 2024-05-14 MED ORDER — SENNOSIDES-DOCUSATE SODIUM 8.6-50 MG PO TABS: 2.0000 | ORAL_TABLET | Freq: Every day | ORAL | 0 refills | Status: DC

## 2024-05-14 MED ORDER — DOXYCYCLINE HYCLATE 100 MG PO CAPS: 100.0000 mg | ORAL_CAPSULE | Freq: Two times a day (BID) | ORAL | 0 refills | Status: DC

## 2024-05-14 MED ORDER — SPIRONOLACTONE 25 MG PO TABS: 12.5000 mg | ORAL_TABLET | Freq: Every day | ORAL | 1 refills | Status: DC

## 2024-05-14 MED ORDER — POTASSIUM CHLORIDE ER 10 MEQ PO TBCR: 10.0000 meq | EXTENDED_RELEASE_TABLET | Freq: Every day | ORAL | 1 refills | Status: DC

## 2024-05-14 NOTE — Progress Notes (Signed)
 Reviewed discharge instructions with patient's son. IV removed from patient's right arm. Son asked to switch prescriptions to [facility]. Dr. Vicci made aware. No issues or concerns during discharge process.

## 2024-05-14 NOTE — Progress Notes (Signed)
 05/14/2024 4:48 PM  Patient son has came back saying [facility] was not able to process the [facility]actone . requested to send it to [facility] on [address]

## 2024-05-14 NOTE — TOC Transition Note (Signed)
 Transition of Care [facility]) - Discharge Note   Patient Details  Name: Calvin Moreno MRN: [id number] Date of Birth: 01-10-1941  Transition of [facility]) CM/SW Contact:  Noreen KATHEE Pinal, LCSWA Phone Number: 05/14/2024, 11:22 AM   Clinical Narrative:     Patient is DC home today with Palliative care services through [facility] and HHPT with [facility]. Spoke with son at bedside about preferences- Son had no HH preference - [facility] was able to accept , other [facility] services still pending. Oxygen through [facility] - son agreeable. Informed [facility] and [facility] to contact son for home set up/ start of services. CSW signing off.   Final next level of care: Home w Home Health Services Barriers to Discharge: Barriers Resolved   Patient Goals and CMS Choice Patient states their goals for this hospitalization and ongoing recovery are:: return home CMS Medicare.gov Compare Post Acute Care list provided to:: Patient Choice offered to / list presented to : Adult Children      Discharge Placement                Patient to be transferred to facility by: Family Name of family member notified: Charlie- Son Patient and family notified of of transfer: 05/14/24  Discharge Plan and Services Additional resources added to the After Visit Summary for   In-house Referral: Clinical Social Work Discharge Planning Services: CM Consult            DME Arranged: Oxygen DME Agency: [facility] Date DME Agency Contacted: 05/14/24 Time DME Agency Contacted: 1121 Representative spoke with at DME Agency: [place] [facility] Rep HH Arranged: PT HH Agency: [facility] Date [facility] Agency Contacted: 05/14/24 Time HH Agency Contacted: 1121 Representative spoke with at [facility] Agency: Referral accepted via HUB  Social Drivers of Health (SDOH) Interventions SDOH Screenings   Food Insecurity: No Food Insecurity (05/12/2024)  Housing: Low Risk (05/12/2024)  Transportation Needs: No Transportation Needs (05/12/2024)   Utilities: Not At Risk (05/12/2024)  Depression (PHQ2-9): Low Risk (04/13/2024)  Financial Resource Strain: Low Risk (05/11/2024)   [facility]  Physical Activity: Inactive (05/11/2024)   Received from [facility]  Social Connections: Socially Integrated (05/12/2024)  Stress: No Stress Concern Present (05/11/2024)   [facility] from [facility]  Tobacco Use: Medium Risk (05/12/2024)  Health Literacy: Medium Risk (05/11/2024)   Received from [facility]     Readmission Risk Interventions    05/14/2024   11:20 AM 03/31/2023   11:44 AM  Readmission Risk Prevention Plan  Transportation Screening Complete Complete  Home Care Screening Complete Complete  Medication Review (RN CM) Complete Complete

## 2024-05-14 NOTE — Discharge Instructions (Signed)
 IMPORTANT INFORMATION: PAY CLOSE ATTENTION   PHYSICIAN DISCHARGE INSTRUCTIONS  Follow with Primary care provider  Benita Stabile, MD  and other consultants as instructed by your Hospitalist Physician  SEEK MEDICAL CARE OR RETURN TO EMERGENCY ROOM IF SYMPTOMS COME BACK, WORSEN OR NEW PROBLEM DEVELOPS   Please note: You were cared for by a hospitalist during your hospital stay. Every effort will be made to forward records to your primary care provider.  You can request that your primary care provider send for your hospital records if they have not received them.  Once you are discharged, your primary care physician will handle any further medical issues. Please note that NO REFILLS for any discharge medications will be authorized once you are discharged, as it is imperative that you return to your primary care physician (or establish a relationship with a primary care physician if you do not have one) for your post hospital discharge needs so that they can reassess your need for medications and monitor your lab values.  Please get a complete blood count and chemistry panel checked by your Primary MD at your next visit, and again as instructed by your Primary MD.  Get Medicines reviewed and adjusted: Please take all your medications with you for your next visit with your Primary MD  Laboratory/radiological data: Please request your Primary MD to go over all hospital tests and procedure/radiological results at the follow up, please ask your primary care provider to get all Hospital records sent to his/her office.  In some cases, they will be blood work, cultures and biopsy results pending at the time of your discharge. Please request that your primary care provider follow up on these results.  If you are diabetic, please bring your blood sugar readings with you to your follow up appointment with primary care.    Please call and make your follow up appointments as soon as possible.    Also Note the  following: If you experience worsening of your admission symptoms, develop shortness of breath, life threatening emergency, suicidal or homicidal thoughts you must seek medical attention immediately by calling 911 or calling your MD immediately  if symptoms less severe.  You must read complete instructions/literature along with all the possible adverse reactions/side effects for all the Medicines you take and that have been prescribed to you. Take any new Medicines after you have completely understood and accpet all the possible adverse reactions/side effects.   Do not drive when taking Pain medications or sleeping medications (Benzodiazepines)  Do not take more than prescribed Pain, Sleep and Anxiety Medications. It is not advisable to combine anxiety,sleep and pain medications without talking with your primary care practitioner  Special Instructions: If you have smoked or chewed Tobacco  in the last 2 yrs please stop smoking, stop any regular Alcohol  and or any Recreational drug use.  Wear Seat belts while driving.  Do not drive if taking any narcotic, mind altering or controlled substances or recreational drugs or alcohol.

## 2024-05-14 NOTE — Discharge Summary (Signed)
 Physician Discharge Summary  Calvin Moreno FMW:[id number] DOB: Nov 04, 1940 DOA: 05/12/2024  PCP: Shona Norleen PEDLAR, MD Cardiology: [facility] Oncology: Davonna   Admit date: 05/12/2024 Discharge date: 05/14/2024  Admitted From:  Home Disposition: Home with outpatient palliative care services  Recommendations for Outpatient Follow-up:  Follow up with PCP in 1 weeks Follow up with cardiology in 1-2 weeks Follow up with oncology in 1-2 weeks  Please obtain BMP/CBC in 1 week  Home Health: outpatient palliative care services   Discharge Condition: STABLE   CODE STATUS: FULL DIET: low sodium recommended    Brief Hospitalization Summary: Please see all hospital notes, images, labs for full details of the hospitalization. 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  He does not use oxygen at home.  He he had an echocardiogram done on 05/03/2024 with findings of LVEF 25 to 30% down from 50 to 55% with severe AS and regional wall motion abnormalities.  Family says they have been trying to get in to see his cardiologist but has been having a difficult time getting an appointment.  Patient has had some increasing peripheral edema and weight gain.  He denies chest pain.  His main complaint has been shortness of breath.  He denies fever and chills.  He has had increasing cough and chest congestion.  He had a recent reported viral illness that lasted for 1/2-day and then resolved.  He saw [facility] oncology 05/11/2024 and they have explained to the patient that his lung  cancer has progressed locally and any therapy would be palliative and not curative.  Patient was supposed to see Dr. Davonna at the [facility] later today for second opinion.  He presented to the ED due to progressive shortness of breath.  His ED work up significant for acute heart failure with bilateral pleural effusions, right upper lobe and left lower lobe consolidation likely pneumonia, negative for pulmonary embolus, new oxygen requirement at 2 L/min, tachypnea and tachycardia.  Admission was requested for management of acute heart failure and acute respiratory failure with hypoxia with new oxygen requirements.  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 Wonda and he again feels that patient is not a candidate for TAVR with a prognosis not greater than 1 year given his recurrence of lung cancer and severe AS with heart failure diagnoses and other comorbidities -- continue diuresis for now as BP tolerates -- entresto  held due to hypotension - he did not tolerate it -- pt adamant he  wants to go home and he is agreeable to palliative care outpatient services -- discussed at length with family who agrees to care for him at home 24/7 -- his weight is down and he is breathing better now -- DC home on lasix  40 mg daily, continue KCl 10 meq daily, spironolactone  12.5  mg daily  -- close follow up with PCP, cardiology, oncology was recommended   Filed Weights   05/12/24 1253 05/13/24 0434 05/14/24 0500  Weight: 68.8 kg 74.1 kg 72.2 kg    Intake/Output Summary (Last 24 hours) at 05/14/2024 1037 Last data filed at 05/14/2024 0841 Gross per 24 hour  Intake 3 ml  Output 1000 ml  Net -997 ml    Acute respiratory failure with hypoxia -- new oxygen requirement -- he is feeling better on supplemental oxygen -- secondary to acute heart failure, pneumonia, lung cancer recurrence  -- home O2 eval completed and he desaturated to 77% with ambulation -- arranged for home O2 at 2L/min    Lung cancer LUL with new recurrence  -- pt seen by [facility] oncology yesterday for 2nd opinion and palliative treatment offered, not curative -- pt wanting another opinion from [facility] for another opinion -- I contacted [facility] and they are agreeable to consult--Dr. Davonna agreed with recommendations from [facility] oncology and life expectancy of 6 months-12 months without treatment would not be surprising given advanced heart disease and recurrent lung cancer. They do not feel like he can be reradiated or have surgery.  [facility] is awaiting additional biopsy results to determine if there is any actionable mutation.   -- palliative medicine consult for goals of care requested but only available to see patient once  -- long discussion with family who accepted home outpatient palliative services     Essential hypertension -- blood pressure stable   Hypothyroidism -- resume levothyroxine    BPH -- resume home meds   History of prostate cancer -- he was diagnosed in 2012, s/p radiation treatment at [facility]   Adult failure to thrive  -- appreciate palliative consult  -- pt seems unlikely candidate for TAVR or other advanced HF therapies -- working closely with family to provide them the information -- family agreed to outpatient palliative care   Discharge Diagnoses:   Principal Problem:   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) Active Problems:   Severe aortic stenosis   Mixed hyperlipidemia   Tobacco abuse, in remission   Essential hypertension   BPH (benign prostatic hyperplasia)   GERD (gastroesophageal reflux disease)   Acquired hypothyroidism   PAD (peripheral artery disease)   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   acyclovir 800 MG tablet Commonly known as: ZOVIRAX Take 800  mg by mouth every 8 (eight) hours as needed. And give one tablet by mouth once daily.   ALPRAZolam  0.5 MG tablet Commonly known as: XANAX  Take 1 tablet (0.5 mg total) by mouth at bedtime.   alum & mag hydroxide-simeth 400-400-40 MG/5ML suspension Commonly known as: MAALOX PLUS Take 5 mLs by mouth every 4 (four) hours as needed for indigestion.   Clobetasol Propionate Emulsion 0.05 % topical foam Apply 1 Application topically 2 (two) times daily.   cyanocobalamin  1000 MCG tablet Commonly known as: VITAMIN B12 Take 2,000 mcg by mouth daily.   docusate sodium  100 MG capsule Commonly known as: COLACE Take 1 capsule (100 mg total) by mouth 2 (two) times daily.   doxycycline  100 MG capsule Commonly known as: VIBRAMYCIN  Take 1 capsule (100 mg total) by mouth 2 (two) times daily for 5 days.   DULoxetine  60 MG  capsule Commonly known as: CYMBALTA  Take 60 mg by mouth every evening.   finasteride  5 MG tablet Commonly known as: PROSCAR  Take 1 tablet (5 mg total) by mouth daily.   furosemide  40 MG tablet Commonly known as: LASIX  Take 1 tablet (40 mg total) by mouth daily. What changed:  medication strength how much to take   gabapentin  100 MG capsule Commonly known as: Neurontin  Take 1 capsule (100 mg total) by mouth at bedtime.   levothyroxine  25 MCG tablet Commonly known as: SYNTHROID  Take 25 mcg by mouth daily.   melatonin 3 MG Tabs tablet Take 3 mg by mouth at bedtime. For sleep   pantoprazole  40 MG tablet Commonly known as: PROTONIX  Take 1 tablet (40 mg total) by mouth daily before breakfast.   polyethylene glycol 17 g packet Commonly known as: MIRALAX  / GLYCOLAX  Take 17 g by mouth daily. Can be spread to every other day in the setting of diarrhea.   potassium chloride  10 MEQ tablet Commonly known as: KLOR-CON  Take 1 tablet (10 mEq total) by mouth daily.   psyllium 95 % Pack Commonly known as: HYDROCIL/METAMUCIL Take 1 packet by mouth 2 (two) times daily.   senna-docusate 8.6-50 MG tablet Commonly known as: Senokot-S Take 2 tablets by mouth at bedtime.   silodosin  8 MG Caps capsule Commonly known as: RAPAFLO  Take 1 capsule (8 mg total) by mouth 2 (two) times daily.   simvastatin  20 MG tablet Commonly known as: ZOCOR  Take 20 mg by mouth every evening.   spironolactone  25 MG tablet Commonly known as: ALDACTONE  Take 0.5 tablets (12.5 mg total) by mouth daily. Start taking on: May 15, 2024   SYSTANE OP Place 1 drop into both eyes 2 (two) times daily as needed (dry eyes).   traMADol  50 MG tablet Commonly known as: ULTRAM  Take 25-50 mg by mouth 2 (two) times daily as needed for moderate pain (pain score 4-6).   vitamin C 1000 MG tablet Take 1,000 mg by mouth daily.               Durable Medical Equipment  (From admission, onward)            Start     Ordered   05/14/24 0719  For home use only DME oxygen  Once       Question Answer Comment  Length of Need Lifetime   Mode or (Route) Nasal cannula   Liters per Minute 2   Frequency Continuous (stationary and portable oxygen unit needed)   Oxygen conserving device Yes   Oxygen delivery system: Gas      05/14/24  9281            Follow-up Information     Shona Norleen PEDLAR, MD Follow up in 1 week(s).   Specialty: Internal Medicine Why: Hospital Follow Up Contact information: [address] [place] [facility] [address] [phone]         Alvan Dorn [place], MD. Schedule an appointment as soon as possible for a visit in 2 week(s).   Specialty: Cardiology Why: Hospital Follow Up Contact information: [address] [place] [address] [phone]         Davonna Siad, MD. Schedule an appointment as soon as possible for a visit in 2 week(s).   Specialty: Oncology Why: Hospital Follow Up Contact information: [address] [place] [address] [phone]                Allergies[1] Allergies as of 05/14/2024       Reactions   Norvasc  [amlodipine  Besylate] Swelling   Ankle swelling   Codeine Nausea And Vomiting   Amoxicillin Rash        Medication List     TAKE these medications    acetaminophen  500 MG tablet Commonly known as: TYLENOL  Take 1,000 mg by mouth every 6 (six) hours as needed for moderate pain (pain score 4-6).   acyclovir 800 MG tablet Commonly known as: ZOVIRAX Take 800 mg by mouth every 8 (eight) hours as needed. And give one tablet by mouth once daily.   ALPRAZolam  0.5 MG tablet Commonly known as: XANAX  Take 1 tablet (0.5 mg total) by mouth at bedtime.   alum & mag hydroxide-simeth 400-400-40 MG/5ML suspension Commonly known as: MAALOX PLUS Take 5 mLs by mouth every 4 (four) hours as needed for indigestion.   Clobetasol Propionate Emulsion 0.05 % topical foam Apply 1 Application topically 2 (two) times  daily.   cyanocobalamin  1000 MCG tablet Commonly known as: VITAMIN B12 Take 2,000 mcg by mouth daily.   docusate sodium  100 MG capsule Commonly known as: COLACE Take 1 capsule (100 mg total) by mouth 2 (two) times daily.   doxycycline  100 MG capsule Commonly known as: VIBRAMYCIN  Take 1 capsule (100 mg total) by mouth 2 (two) times daily for 5 days.   DULoxetine  60 MG capsule Commonly known as: CYMBALTA  Take 60 mg by mouth every evening.   finasteride  5 MG tablet Commonly known as: PROSCAR  Take 1 tablet (5 mg total) by mouth daily.   furosemide  40 MG tablet Commonly known as: LASIX  Take 1 tablet (40 mg total) by mouth daily. What changed:  medication strength how much to take   gabapentin  100 MG capsule Commonly known as: Neurontin  Take 1 capsule (100 mg total) by mouth at bedtime.   levothyroxine  25 MCG tablet Commonly known as: SYNTHROID  Take 25 mcg by mouth daily.   melatonin 3 MG Tabs tablet Take 3 mg by mouth at bedtime. For sleep   pantoprazole  40 MG tablet Commonly known as: PROTONIX  Take 1 tablet (40 mg total) by mouth daily before breakfast.   polyethylene glycol 17 g packet Commonly known as: MIRALAX  / GLYCOLAX  Take 17 g by mouth daily. Can be spread to every other day in the setting of diarrhea.   potassium chloride  10 MEQ tablet Commonly known as: KLOR-CON  Take 1 tablet (10 mEq total) by mouth daily.   psyllium 95 % Pack Commonly known as: HYDROCIL/METAMUCIL Take 1 packet by mouth 2 (two) times daily.   senna-docusate 8.6-50 MG tablet Commonly known as: Senokot-S Take  2 tablets by mouth at bedtime.   silodosin  8 MG Caps capsule Commonly known as: RAPAFLO  Take 1 capsule (8 mg total) by mouth 2 (two) times daily.   simvastatin  20 MG tablet Commonly known as: ZOCOR  Take 20 mg by mouth every evening.   spironolactone  25 MG tablet Commonly known as: ALDACTONE  Take 0.5 tablets (12.5 mg total) by mouth daily. Start taking on: May 15, 2024   SYSTANE OP Place 1 drop into both eyes 2 (two) times daily as needed (dry eyes).   traMADol  50 MG tablet Commonly known as: ULTRAM  Take 25-50 mg by mouth 2 (two) times daily as needed for moderate pain (pain score 4-6).   vitamin C 1000 MG tablet Take 1,000 mg by mouth daily.               Durable Medical Equipment  (From admission, onward)           Start     Ordered   05/14/24 0719  For home use only DME oxygen  Once       Question Answer Comment  Length of Need Lifetime   Mode or (Route) Nasal cannula   Liters per Minute 2   Frequency Continuous (stationary and portable oxygen unit needed)   Oxygen conserving device Yes   Oxygen delivery system: Gas      05/14/24 0718            Procedures/Studies: DG Chest Port 1 View Result Date: 05/13/2024 CLINICAL DATA:  Acute heart failure with reduced ejection fraction. EXAM: PORTABLE CHEST 1 VIEW COMPARISON:  05/12/2024.  Chest CTA dated 05/12/2024. FINDINGS: Normal-sized heart. Tortuous and partially calcified thoracic aorta. Bilateral patchy densities with improvement. Small bilateral pleural effusions with probable loculation superiorly on the left. Chronic interstitial prominence. Diffuse osteopenia. Thoracic spine degenerative changes. IMPRESSION: 1. Mildly improved bilateral pneumonia. 2. Small bilateral pleural effusions with probable loculation superiorly on the left. 3. Chronic interstitial lung disease. Electronically Signed   By: Elspeth Bathe M.D.   On: 05/13/2024 15:38   CT Angio Chest Pulmonary Embolism (PE) W or WO Contrast Result Date: 05/12/2024 EXAM: CTA CHEST 05/12/2024 07:21:54 AM TECHNIQUE: CTA of the chest was performed after the administration of intravenous contrast. Multiplanar reformatted images are provided for review. MIP images are provided for review. Automated exposure control, iterative reconstruction, and/or weight based adjustment of the mA/kV was utilized to reduce the radiation  dose to as low as reasonably achievable. COMPARISON: CT of the chest dated 04/06/2024. CLINICAL HISTORY: sob FINDINGS: PULMONARY ARTERIES: Pulmonary arteries are adequately opacified for evaluation. No acute pulmonary embolus. The right main pulmonary artery is dilated, measuring approximately 3.0 cm in diameter. MEDIASTINUM: The heart and pericardium demonstrate no acute abnormality. There is calcific coronary artery disease present. There is also calcification of the mitral annulus. The thoracic aorta is normal in caliber, but demonstrates mild-to-moderate calcific atheromatous disease. LYMPH NODES: No mediastinal, hilar or axillary lymphadenopathy. LUNGS AND PLEURA: There has been significant interval worsening of opacification/consolidation of the left upper lobe, enveloping the ovoid mass noted within the left upper lobe on the previous study. There is also patchy consolidation of the right upper lobe and superior segment of the right lower lobe. There is also patchy opacification/consolidation of the superior segment of the left lower lobe. Since the previous study, the patient has developed moderate bilateral pleural effusions. No pneumothorax. UPPER ABDOMEN: Limited images of the upper abdomen are unremarkable. SOFT TISSUES AND BONES: No acute bone or soft  tissue abnormality. IMPRESSION: 1. No evidence of pulmonary embolus. 2. Significant interval worsening of opacification/consolidation of the left upper lobe, enveloping the ovoid mass noted within the left upper lobe on the previous study. 3. Patchy consolidation of the right upper lobe and superior segment of the right lower lobe. 4. Patchy opacification/consolidation of the superior segment of the left lower lobe. 5. Moderate bilateral pleural effusions, new since the previous study. 6. Dilated right main pulmonary artery, measuring approximately 3.0 cm in diameter, which can be seen with pulmonary arterial hypertension. 7. Calcific coronary artery  disease and mitral annular calcification. Electronically signed by: Evalene Coho MD 05/12/2024 07:50 AM EST RP Workstation: [id number]   DG Chest 1 View Result Date: 05/12/2024 EXAM: 1 VIEW(S) XRAY OF THE CHEST 05/12/2024 05:09:00 AM COMPARISON: PET CT 04/28/2024 CLINICAL HISTORY: SOB FINDINGS: LUNGS AND PLEURA: Right mid lung perihilar opacity is new from the previous exam. Left upper lobe mass. Moderate diffuse interstitial edema. Small right pleural effusion. No pneumothorax. HEART AND MEDIASTINUM: Aortic calcification. BONES AND SOFT TISSUES: No acute osseous abnormality. IMPRESSION: 1. Moderate diffuse interstitial edema. 2. Small right pleural effusion. 3. New right mid lung perihilar opacity, compatible with pneumonia . 4. Left upper lobe mass, as described on the comparison PET CT from 04/28/2024 . Electronically signed by: Waddell Calk MD 05/12/2024 06:02 AM EST RP Workstation: [id number]   ECHOCARDIOGRAM COMPLETE Result Date: 05/03/2024    ECHOCARDIOGRAM REPORT   Patient Name:   ANGELINO RUMERY Date of Exam: 05/03/2024 Medical Rec #:  [id number]        Height:       66.0 in Accession #:    [id number]       Weight:       176.0 lb Date of Birth:  13-Feb-1941        BSA:          1.894 m Patient Age:    83 years         BP:           106/67 mmHg Patient Gender: M                HR:           97 bpm. Exam Location:  [facility] Procedure: 2D Echo, 3D Echo, Cardiac Doppler, Color Doppler and Strain Analysis            (Both Spectral and Color Flow Doppler were utilized during            procedure). Indications:    Aortic Stenosis  History:        Patient has prior history of Echocardiogram examinations, most                 recent 09/18/2023. Stroke, Arrythmias:LBBB; Risk Factors:Former                 Smoker, Hypertension and Dyslipidemia. Prostate cancer (HCC)                 (From Hx).  Sonographer:    Aida Pizza RCS Referring Phys: [id number] DORN FALCON BRANCH  Sonographer Comments: Global  longitudinal strain was attempted. IMPRESSIONS  1. No LV thrombus by Definity . Left ventricular ejection fraction, by estimation, is 25 to 30%. Left ventricular ejection fraction by 3D volume is 37 %. The left ventricle has severely decreased function. The left ventricle demonstrates regional wall motion abnormalities (see scoring diagram/findings for description). There is mild left ventricular hypertrophy. Indeterminate diastolic filling due to  E-A fusion. The average left ventricular global longitudinal strain is -3.3 %. The global longitudinal  strain is abnormal.  2. Right ventricular systolic function is normal. The right ventricular size is normal. Tricuspid regurgitation signal is inadequate for assessing PA pressure.  3. The mitral valve is abnormal. No evidence of mitral valve regurgitation. Severe mitral annular calcification.  4. The aortic valve has an indeterminant number of cusps. There is severe calcifcation of the aortic valve. Aortic valve regurgitation is moderate to severe. Severe aortic valve stenosis, low flow-low gradient. Aortic regurgitation PHT measures 231 msec. Aortic valve area, by VTI measures 0.43 cm. Aortic valve mean gradient measures 28.0 mmHg. Aortic valve Vmax measures 3.48 m/s. DVI is 0.22 and SVI are 15.8, worse compared to prior echo.  5. The inferior vena cava is normal in size with greater than 50% respiratory variability, suggesting right atrial pressure of 3 mmHg. Comparison(s): Changes from prior study are noted. LVEF worsened. Severe AS (DVI and SVI worsened) and moderate to severe AI. FINDINGS  Left Ventricle: No LV thrombus by Definity . Left ventricular ejection fraction, by estimation, is 25 to 30%. Left ventricular ejection fraction by 3D volume is 37 %. The left ventricle has severely decreased function. The left ventricle demonstrates regional wall motion abnormalities. The average left ventricular global longitudinal strain is -3.3 %. Strain was performed and the  global longitudinal strain is abnormal. The left ventricular internal cavity size was normal in size. There is mild left ventricular hypertrophy. Abnormal (paradoxical) septal motion, consistent with left bundle branch block. Indeterminate diastolic filling due to E-A fusion.  LV Wall Scoring: The entire anterior wall, entire septum, and apex are akinetic. The entire lateral wall and entire inferior wall are hypokinetic. Right Ventricle: The right ventricular size is normal. No increase in right ventricular wall thickness. Right ventricular systolic function is normal. Tricuspid regurgitation signal is inadequate for assessing PA pressure. Left Atrium: Left atrial size was normal in size. Right Atrium: Right atrial size was normal in size. Pericardium: There is no evidence of pericardial effusion. Mitral Valve: The mitral valve is abnormal. Severe mitral annular calcification. No evidence of mitral valve regurgitation. Tricuspid Valve: The tricuspid valve is normal in structure. Tricuspid valve regurgitation is not demonstrated. No evidence of tricuspid stenosis. Aortic Valve: The aortic valve has an indeterminant number of cusps. There is severe calcifcation of the aortic valve. Aortic valve regurgitation is moderate to severe. Aortic regurgitation PHT measures 231 msec. Severe aortic stenosis is present. Aortic  valve mean gradient measures 28.0 mmHg. Aortic valve peak gradient measures 48.4 mmHg. Aortic valve area, by VTI measures 0.43 cm. Pulmonic Valve: The pulmonic valve was normal in structure. Pulmonic valve regurgitation is not visualized. No evidence of pulmonic stenosis. Aorta: The aortic root is normal in size and structure. Venous: The inferior vena cava is normal in size with greater than 50% respiratory variability, suggesting right atrial pressure of 3 mmHg. IAS/Shunts: No atrial level shunt detected by color flow Doppler. Additional Comments: 3D was performed not requiring image post processing on  an independent workstation and was abnormal.  LEFT VENTRICLE PLAX 2D LVIDd:         4.50 cm LVIDs:         3.60 cm         2D Longitudinal LV PW:         1.20 cm         Strain LV IVS:        1.30 cm  2D Strain GLS   -3.3 % LVOT diam:     1.60 cm         Avg: LV SV:         30 LV SV Index:   16              3D Volume EF LVOT Area:     2.01 cm        LV 3D EF:    Left                                             ventricul                                             ar LV Volumes (MOD)                            ejection LV vol d, MOD    97.2 ml                    fraction A4C:                                        by 3D LV vol s, MOD    59.3 ml                    volume is A4C:                                        37 %. LV SV MOD A4C:   97.2 ml                                 3D Volume EF:                                3D EF:        37 %                                LV EDV:       145 ml                                LV ESV:       91 ml                                LV SV:        54 ml RIGHT VENTRICLE TAPSE (M-mode): 2.5 cm LEFT ATRIUM           Index        RIGHT ATRIUM           Index LA diam:      3.60 cm  1.90 cm/m   RA Area:     13.60 cm LA Vol (A4C): 62.3 ml 32.89 ml/m  RA Volume:   35.00 ml  18.48 ml/m  AORTIC VALVE AV Area (Vmax):    0.45 cm AV Area (Vmean):   0.46 cm AV Area (VTI):     0.43 cm AV Vmax:           348.00 cm/s AV Vmean:          249.000 cm/s AV VTI:            0.698 m AV Peak Grad:      48.4 mmHg AV Mean Grad:      28.0 mmHg LVOT Vmax:         77.50 cm/s LVOT Vmean:        57.300 cm/s LVOT VTI:          0.151 m LVOT/AV VTI ratio: 0.22 AI PHT:            231 msec  AORTA Ao Root diam: 3.50 cm MITRAL VALVE MV Area (PHT): 5.38 cm     SHUNTS MV Decel Time: 141 msec     Systemic VTI:  0.15 m MV E velocity: 190.00 cm/s  Systemic Diam: 1.60 cm Vishnu Priya Mallipeddi Electronically signed by Diannah Late Mallipeddi Signature Date/Time: 05/03/2024/3:58:52 PM    Final    NM PET  Image Restag (PS) Skull Base To Thigh Result Date: 04/29/2024 CLINICAL DATA:  Subsequent treatment strategy for non-small cell lung cancer. Prior radiation therapy. EXAM: NUCLEAR MEDICINE PET SKULL BASE TO THIGH TECHNIQUE: 8.6 mCi F-18 FDG was injected intravenously. Full-ring PET imaging was performed from the skull base to thigh after the radiotracer. CT data was obtained and used for attenuation correction and anatomic localization. Fasting blood glucose: 138 mg/dl COMPARISON:  Chest CT 88/87/7974 and 09/24/2023.  PET-CT 04/28/2023. FINDINGS: Mediastinal blood pool activity: SUV max 2.5 NECK: No hypermetabolic cervical lymph nodes are identified.Mildly increased asymmetric metabolic activity in the region of the left arytenoid cartilage (SUV max 11.0). No corresponding abnormality on the CT images, and this is only slightly progressive compared with the prior PET-CT. No other suspicious activity identified within the pharyngeal mucosal space. Incidental CT findings: Bilateral carotid atherosclerosis. CHEST: There are no hypermetabolic mediastinal, hilar or axillary lymph nodes. Specifically, there is no hypermetabolic activity within the recently described AP window or subcarinal lymph nodes. The enlarging left upper lobe mass demonstrates intense hypermetabolic activity (SUV max 23.4, previously 13.1). This mass measures approximately 4.7 x 4.3 cm on image 21/203, similar to the recent diagnostic CT and enlarged from other prior studies. Posterior to this dominant mass, there is increased focal airspace disease radiating superiorly to the left apex, likely postobstructive pneumonitis. This demonstrates low level metabolic activity (SUV max 6.4). No other hypermetabolic pulmonary activity or suspicious nodularity. Incidental CT findings: Atherosclerosis of the aorta, great vessels and coronary arteries. Aortic valvular and mitral annular calcifications. Moderate centrilobular and paraseptal emphysema with mild  dependent atelectasis at both lung bases and a small left pleural effusion. ABDOMEN/PELVIS: There is no hypermetabolic activity within the liver, adrenal glands, spleen or pancreas. There is no hypermetabolic nodal activity in the abdomen or pelvis. Incidental CT findings: Stable simple appearing 4.8 cm cyst within the lower pole of the right kidney; no specific follow-up imaging recommended. Aortic and branch vessel atherosclerosis. Prostate fiducial markers. SKELETON: There is no hypermetabolic activity to suggest osseous metastatic disease. Incidental CT findings: Chronic L4 compression fracture. Mild spondylosis.  IMPRESSION: 1. The enlarging left upper lobe mass demonstrates intense hypermetabolic activity consistent with local recurrence of lung cancer. 2. No evidence of metastatic disease. Specifically, no hypermetabolic activity within the recently described AP window and subcarinal lymph nodes. 3. Increased focal airspace disease posterior to the dominant left upper lobe mass with low level metabolic activity, likely posterior obstructive pneumonitis. 4. Mildly increased asymmetric metabolic activity in the region of the left arytenoid cartilage, only slightly progressive from the prior PET-CT. No corresponding abnormality on the CT images. This is of questionable clinical significance; correlate clinically. 5. Aortic Atherosclerosis (ICD10-I70.0) and Emphysema (ICD10-J43.9). Electronically Signed   By: Elsie Perone M.D.   On: 04/29/2024 11:10     Subjective: Pt adamant he wants to go home today. Agreeable to home outpatient palliative services.  No SOB or CP symptoms.    Discharge Exam: Vitals:   05/14/24 0600 05/14/24 0741  BP: 118/71   Pulse: (!) 117   Resp: (!) 21   Temp:  98.3 F (36.8 C)  SpO2: 97%    Vitals:   05/14/24 0506 05/14/24 0507 05/14/24 0600 05/14/24 0741  BP: 96/61  118/71   Pulse: (!) 115  (!) 117   Resp: 20  (!) 21   Temp:  98.2 F (36.8 C)  98.3 F (36.8 C)   TempSrc:  Oral  Oral  SpO2: 95%  97%   Weight:      Height:       General exam: hard of hearing.  Appears calm and comfortable  Respiratory system: no increased work of breathing. Cardiovascular system: tachycardic, normal S1 & S2 heard. mild JVD, harsh systolic murmur, rubs, No gallops or clicks. 1+ pedal edema. Gastrointestinal system: Abdomen is distended, soft and nontender. No organomegaly or masses felt. Normal bowel sounds heard. Central nervous system: Alert and oriented. No focal neurological deficits. Extremities: Symmetric 5 x 5 power. Skin: age-related changes. No rashes, lesions or ulcers. Psychiatry: Judgement and insight appear normal. Mood & affect appropriate.   The results of significant diagnostics from this hospitalization (including imaging, microbiology, ancillary and laboratory) are listed below for reference.     Microbiology: Recent Results (from the past 240 hours)  Resp panel by RT-PCR (RSV, Flu A&B, Covid) Anterior Nasal Swab     Status: None   Collection Time: 05/12/24  5:25 AM   Specimen: Anterior Nasal Swab  Result Value Ref Range Status   SARS Coronavirus 2 by RT PCR NEGATIVE NEGATIVE Final    Comment: (NOTE) SARS-CoV-2 target nucleic acids are NOT DETECTED.  The SARS-CoV-2 RNA is generally detectable in upper respiratory specimens during the acute phase of infection. The lowest concentration of SARS-CoV-2 viral copies this assay can detect is 138 copies/mL. A negative result does not preclude SARS-Cov-2 infection and should not be used as the sole basis for treatment or other patient management decisions. A negative result may occur with  improper specimen collection/handling, submission of specimen other than nasopharyngeal swab, presence of viral mutation(s) within the areas targeted by this assay, and inadequate number of viral copies(<138 copies/mL). A negative result must be combined with clinical observations, patient history, and  epidemiological information. The expected result is Negative.  Fact Sheet for Patients:  bloggercourse.com  Fact Sheet for Healthcare Providers:  seriousbroker.it  This test is no t yet approved or cleared by the [place]  FDA and  has been authorized for detection and/or diagnosis of SARS-CoV-2 by FDA under an Emergency Use Authorization (EUA). This EUA will remain  in effect (meaning this test can be used) for the duration of the COVID-19 declaration under Section 564(b)(1) of the Act, 21 U.S.C.section 360bbb-3(b)(1), unless the authorization is terminated  or revoked sooner.       Influenza A by PCR NEGATIVE NEGATIVE Final   Influenza B by PCR NEGATIVE NEGATIVE Final    Comment: (NOTE) The Xpert Xpress SARS-CoV-2/FLU/RSV plus assay is intended as an aid in the diagnosis of influenza from Nasopharyngeal swab specimens and should not be used as a sole basis for treatment. Nasal washings and aspirates are unacceptable for Xpert Xpress SARS-CoV-2/FLU/RSV testing.  Fact Sheet for Patients: bloggercourse.com  Fact Sheet for Healthcare Providers: seriousbroker.it  This test is not yet approved or cleared by the [place]  FDA and has been authorized for detection and/or diagnosis of SARS-CoV-2 by FDA under an Emergency Use Authorization (EUA). This EUA will remain in effect (meaning this test can be used) for the duration of the COVID-19 declaration under Section 564(b)(1) of the Act, 21 U.S.C. section 360bbb-3(b)(1), unless the authorization is terminated or revoked.     Resp Syncytial Virus by PCR NEGATIVE NEGATIVE Final    Comment: (NOTE) Fact Sheet for Patients: bloggercourse.com  Fact Sheet for Healthcare Providers: seriousbroker.it  This test is not yet approved or cleared by the [place]  FDA and has been  authorized for detection and/or diagnosis of SARS-CoV-2 by FDA under an Emergency Use Authorization (EUA). This EUA will remain in effect (meaning this test can be used) for the duration of the COVID-19 declaration under Section 564(b)(1) of the Act, 21 U.S.C. section 360bbb-3(b)(1), unless the authorization is terminated or revoked.  Performed at [facility], [address], [place], [place] [address]   Culture, blood (routine x 2)     Status: None (Preliminary result)   Collection Time: 05/12/24  6:37 AM   Specimen: BLOOD  Result Value Ref Range Status   Specimen Description BLOOD BLOOD LEFT ARM  Final   Special Requests   Final    Blood Culture adequate volume BOTTLES DRAWN AEROBIC AND ANAEROBIC   Culture   Final    NO GROWTH 2 DAYS Performed at [facility], [address], [place], [place] [address]    Report Status PENDING  Incomplete  Culture, blood (routine x 2)     Status: None (Preliminary result)   Collection Time: 05/12/24  6:37 AM   Specimen: BLOOD  Result Value Ref Range Status   Specimen Description BLOOD BLOOD LEFT HAND  Final   Special Requests   Final    BOTTLES DRAWN AEROBIC AND ANAEROBIC Blood Culture adequate volume   Culture   Final    NO GROWTH 2 DAYS Performed at [facility], [address], [place], [place] [address]    Report Status PENDING  Incomplete  MRSA Next Gen by PCR, Nasal     Status: None   Collection Time: 05/12/24 12:44 PM   Specimen: Nasal Mucosa; Nasal Swab  Result Value Ref Range Status   MRSA by PCR Next Gen NOT DETECTED NOT DETECTED Final    Comment: (NOTE) The GeneXpert MRSA Assay (FDA approved for NASAL specimens only), is one component of a comprehensive MRSA colonization surveillance program. It is not intended to diagnose MRSA infection nor to guide or monitor treatment for MRSA infections. Test performance is not FDA approved in patients less than 66 years old. Performed at [facility], [address] [place], [place]  [address]      Labs: BNP (last  3 results) No results for input(s): BNP in the last 8760 hours. Basic Metabolic Panel: Recent Labs  Lab 05/12/24 0442 05/13/24 0456 05/14/24 0253  NA 139 138 138  K 4.7 3.7 3.5  CL 100 100 100  CO2 30 31 30   GLUCOSE 202* 166* 164*  BUN 20 20 19   CREATININE 1.11 1.01 0.98  CALCIUM 9.1 8.1* 8.3*  MG  --  2.7* 2.3   Liver Function Tests: No results for input(s): AST, ALT, ALKPHOS, BILITOT, PROT, ALBUMIN in the last 168 hours. No results for input(s): LIPASE, AMYLASE in the last 168 hours. No results for input(s): AMMONIA in the last 168 hours. CBC: Recent Labs  Lab 05/12/24 0442  WBC 16.0*  NEUTROABS 14.4*  HGB 13.5  HCT 43.0  MCV 88.7  PLT 523*   Cardiac Enzymes: No results for input(s): CKTOTAL, CKMB, CKMBINDEX, TROPONINI in the last 168 hours. BNP: Invalid input(s): POCBNP CBG: No results for input(s): GLUCAP in the last 168 hours. D-Dimer No results for input(s): DDIMER in the last 72 hours. Hgb A1c No results for input(s): HGBA1C in the last 72 hours. Lipid Profile Recent Labs    05/13/24 0456  CHOL 94  HDL 28*  LDLCALC 49  TRIG 84  CHOLHDL 3.3   Thyroid  function studies No results for input(s): TSH, T4TOTAL, T3FREE, THYROIDAB in the last 72 hours.  Invalid input(s): FREET3 Anemia work up No results for input(s): VITAMINB12, FOLATE, FERRITIN, TIBC, IRON, RETICCTPCT in the last 72 hours. Urinalysis    Component Value Date/Time   COLORURINE YELLOW 03/30/2023 1930   APPEARANCEUR Clear 03/30/2024 1443   LABSPEC 1.023 03/30/2023 1930   PHURINE 5.0 03/30/2023 1930   GLUCOSEU Negative 03/30/2024 1443   HGBUR NEGATIVE 03/30/2023 1930   BILIRUBINUR Negative 03/30/2024 1443   KETONESUR NEGATIVE 03/30/2023 1930   PROTEINUR Negative 03/30/2024 1443   PROTEINUR NEGATIVE 03/30/2023 1930   UROBILINOGEN negative (A) 10/19/2019 1444   UROBILINOGEN 0.2 08/26/2009 0930    NITRITE Negative 03/30/2024 1443   NITRITE NEGATIVE 03/30/2023 1930   LEUKOCYTESUR Negative 03/30/2024 1443   LEUKOCYTESUR NEGATIVE 03/30/2023 1930   Sepsis Labs Recent Labs  Lab 05/12/24 0442  WBC 16.0*   Microbiology Recent Results (from the past 240 hours)  Resp panel by RT-PCR (RSV, Flu A&B, Covid) Anterior Nasal Swab     Status: None   Collection Time: 05/12/24  5:25 AM   Specimen: Anterior Nasal Swab  Result Value Ref Range Status   SARS Coronavirus 2 by RT PCR NEGATIVE NEGATIVE Final    Comment: (NOTE) SARS-CoV-2 target nucleic acids are NOT DETECTED.  The SARS-CoV-2 RNA is generally detectable in upper respiratory specimens during the acute phase of infection. The lowest concentration of SARS-CoV-2 viral copies this assay can detect is 138 copies/mL. A negative result does not preclude SARS-Cov-2 infection and should not be used as the sole basis for treatment or other patient management decisions. A negative result may occur with  improper specimen collection/handling, submission of specimen other than nasopharyngeal swab, presence of viral mutation(s) within the areas targeted by this assay, and inadequate number of viral copies(<138 copies/mL). A negative result must be combined with clinical observations, patient history, and epidemiological information. The expected result is Negative.  Fact Sheet for Patients:  bloggercourse.com  Fact Sheet for Healthcare Providers:  seriousbroker.it  This test is no t yet approved or cleared by the [place]  FDA and  has been authorized for detection and/or diagnosis of SARS-CoV-2 by FDA under an  Emergency Use Authorization (EUA). This EUA will remain  in effect (meaning this test can be used) for the duration of the COVID-19 declaration under Section 564(b)(1) of the Act, 21 U.S.C.section 360bbb-3(b)(1), unless the authorization is terminated  or revoked sooner.        Influenza A by PCR NEGATIVE NEGATIVE Final   Influenza B by PCR NEGATIVE NEGATIVE Final    Comment: (NOTE) The Xpert Xpress SARS-CoV-2/FLU/RSV plus assay is intended as an aid in the diagnosis of influenza from Nasopharyngeal swab specimens and should not be used as a sole basis for treatment. Nasal washings and aspirates are unacceptable for Xpert Xpress SARS-CoV-2/FLU/RSV testing.  Fact Sheet for Patients: bloggercourse.com  Fact Sheet for Healthcare Providers: seriousbroker.it  This test is not yet approved or cleared by the [place]  FDA and has been authorized for detection and/or diagnosis of SARS-CoV-2 by FDA under an Emergency Use Authorization (EUA). This EUA will remain in effect (meaning this test can be used) for the duration of the COVID-19 declaration under Section 564(b)(1) of the Act, 21 U.S.C. section 360bbb-3(b)(1), unless the authorization is terminated or revoked.     Resp Syncytial Virus by PCR NEGATIVE NEGATIVE Final    Comment: (NOTE) Fact Sheet for Patients: bloggercourse.com  Fact Sheet for Healthcare Providers: seriousbroker.it  This test is not yet approved or cleared by the [place]  FDA and has been authorized for detection and/or diagnosis of SARS-CoV-2 by FDA under an Emergency Use Authorization (EUA). This EUA will remain in effect (meaning this test can be used) for the duration of the COVID-19 declaration under Section 564(b)(1) of the Act, 21 U.S.C. section 360bbb-3(b)(1), unless the authorization is terminated or revoked.  Performed at [facility], [address] [place], [place] [address]   Culture, blood (routine x 2)     Status: None (Preliminary result)   Collection Time: 05/12/24  6:37 AM   Specimen: BLOOD  Result Value Ref Range Status   Specimen Description BLOOD BLOOD LEFT ARM  Final   Special Requests   Final     Blood Culture adequate volume BOTTLES DRAWN AEROBIC AND ANAEROBIC   Culture   Final    NO GROWTH 2 DAYS Performed at [facility], [address], [place], [place] [address]    Report Status PENDING  Incomplete  Culture, blood (routine x 2)     Status: None (Preliminary result)   Collection Time: 05/12/24  6:37 AM   Specimen: BLOOD  Result Value Ref Range Status   Specimen Description BLOOD BLOOD LEFT HAND  Final   Special Requests   Final    BOTTLES DRAWN AEROBIC AND ANAEROBIC Blood Culture adequate volume   Culture   Final    NO GROWTH 2 DAYS Performed at [facility], [address], [place], [place] [address]    Report Status PENDING  Incomplete  MRSA Next Gen by PCR, Nasal     Status: None   Collection Time: 05/12/24 12:44 PM   Specimen: Nasal Mucosa; Nasal Swab  Result Value Ref Range Status   MRSA by PCR Next Gen NOT DETECTED NOT DETECTED Final    Comment: (NOTE) The GeneXpert MRSA Assay (FDA approved for NASAL specimens only), is one component of a comprehensive MRSA colonization surveillance program. It is not intended to diagnose MRSA infection nor to guide or monitor treatment for MRSA infections. Test performance is not FDA approved in patients less than 44 years old. Performed at [facility], [address] [place], [place]  [address]    Time coordinating discharge:  60 mins  SIGNED:  Afton Louder, MD  [facility] 05/14/2024, 10:32 AM How to contact the [facility] Attending or Consulting provider 7A - 7P or covering provider during after hours 7P -7A, for this patient?  Check the care team in [facility] and look for a) attending/consulting [facility] provider listed and b) the TR[facility] team listed Log into [email or web address] and use [place]'s universal password to access. If you do not have the password, please contact the hospital operator. Locate the [facility] provider you are looking for under [facility] and page to a number that you can be directly reached. If you still  have difficulty reaching the provider, please page the [facility] (Director on Call) for the Hospitalists listed on amion for assistance.     [1]  Allergies Allergen Reactions   Norvasc  [Amlodipine  Besylate] Swelling    Ankle swelling   Codeine Nausea And Vomiting   Amoxicillin Rash

## 2024-05-16 ENCOUNTER — Telehealth: Payer: Self-pay

## 2024-05-16 NOTE — Telephone Encounter (Signed)
 Pt son called in today to make Dr. Sherrilee aware pt has lung cancer, CHF that has decline and pneumonia. Spoke to Dr. Sherrilee in clinic on patient condition. Verbal from MD pt would need Pre-op work up and the anesthesia team will make the call if pt is a safe candidate for surgery.  Pt's sone made aware and voiced understanding.  Pt's would like to cancel surgery or push surgery date out sometime later. Advised not sure if surgery dates can be held but a message will be sent to MD and surgery scheduler.

## 2024-05-17 LAB — CULTURE, BLOOD (ROUTINE X 2)
Culture: NO GROWTH
Culture: NO GROWTH
Special Requests: ADEQUATE
Special Requests: ADEQUATE

## 2024-05-17 NOTE — Progress Notes (Signed)
 I returned a phone call to Mr. Xiang son Eldar Robitaille who contacted our office to determine what options were available for his father deemed to be too weak for any type of cancer treatment.  I reviewed that he had been hospitalized and was noted to have acute heart failure with a reduced ejection fraction down to 25 to 30% with severe aortic stenosis with regional wall motion abnormalities.  They were trying to determine whether he would be considered a candidate for a valve procedure.  He addressed questions with regard to cancer therapy. I advised at the present time based off of the location of the mass in an area that was previously treated with radiation that radiation would not be a therapeutic option for the patient at this time and would only be considered in the circumstance for palliation of symptoms.  He had already spoken with Dr. Charlie in regard to therapeutic options with chemoimmunotherapy with a PD-L1 less than 1% that demonstrated a median overall survival of greater than 18 months.  I did advise that in his father's circumstances prognosis is unlikely to be at that level and that it is difficult for us  to determine his prognosis if he were to receive treatment but I was in agreement that his prognosis with no therapy would typically be approximately 6 months to a year in regard to his lung cancer.  I advised that he would need to speak with the cardiology team on the risks/benefits of consideration of a procedure for his valve.  He states that his father has been indecisive in terms of decisions for treatment.  I did advise that palliative care/hospice would not be unreasonable if his performance status did not improve.  I addressed that a palliative care consultation could potentially help clarify his wishes and goals regarding future treatments.  Alm CHARLENA Blumenthal, MD [facility] [phone]

## 2024-05-23 ENCOUNTER — Encounter: Payer: Self-pay | Admitting: Urology

## 2024-05-23 ENCOUNTER — Encounter: Payer: Self-pay | Admitting: *Deleted

## 2024-05-23 NOTE — Telephone Encounter (Signed)
 Called patient to confirm new surgery date.  No answer, VM not set up so unable to leave a voicemail.  I will document further communication in surgery referral.

## 2024-05-23 NOTE — Pre-Procedure Instructions (Signed)
 Dr Herschell reviewed patients chart and states he is not a candidate for surgery at [facility] due to his EF of 25-30% and his severe aortic stenosis. I messaged Veleria Silversmith and Dr Sherrilee to let them know.

## 2024-05-24 ENCOUNTER — Encounter (HOSPITAL_COMMUNITY): Admission: RE | Admit: 2024-05-24 | Source: Ambulatory Visit

## 2024-05-25 ENCOUNTER — Ambulatory Visit: Payer: Self-pay | Admitting: Cardiology

## 2024-05-29 ENCOUNTER — Inpatient Hospital Stay (HOSPITAL_COMMUNITY)
Admission: EM | Admit: 2024-05-29 | Discharge: 2024-06-26 | DRG: 871 | Disposition: E | Attending: Family Medicine | Admitting: Family Medicine

## 2024-05-29 ENCOUNTER — Encounter (HOSPITAL_COMMUNITY): Payer: Self-pay

## 2024-05-29 ENCOUNTER — Emergency Department (HOSPITAL_COMMUNITY)

## 2024-05-29 DIAGNOSIS — C61 Malignant neoplasm of prostate: Secondary | ICD-10-CM | POA: Diagnosis present

## 2024-05-29 DIAGNOSIS — E782 Mixed hyperlipidemia: Secondary | ICD-10-CM | POA: Diagnosis present

## 2024-05-29 DIAGNOSIS — Z8052 Family history of malignant neoplasm of bladder: Secondary | ICD-10-CM

## 2024-05-29 DIAGNOSIS — I110 Hypertensive heart disease with heart failure: Secondary | ICD-10-CM | POA: Diagnosis present

## 2024-05-29 DIAGNOSIS — Z79890 Hormone replacement therapy (postmenopausal): Secondary | ICD-10-CM

## 2024-05-29 DIAGNOSIS — J810 Acute pulmonary edema: Secondary | ICD-10-CM

## 2024-05-29 DIAGNOSIS — E039 Hypothyroidism, unspecified: Secondary | ICD-10-CM | POA: Diagnosis present

## 2024-05-29 DIAGNOSIS — Z8673 Personal history of transient ischemic attack (TIA), and cerebral infarction without residual deficits: Secondary | ICD-10-CM

## 2024-05-29 DIAGNOSIS — R6521 Severe sepsis with septic shock: Secondary | ICD-10-CM | POA: Diagnosis present

## 2024-05-29 DIAGNOSIS — I350 Nonrheumatic aortic (valve) stenosis: Secondary | ICD-10-CM | POA: Diagnosis present

## 2024-05-29 DIAGNOSIS — I739 Peripheral vascular disease, unspecified: Secondary | ICD-10-CM | POA: Diagnosis present

## 2024-05-29 DIAGNOSIS — K219 Gastro-esophageal reflux disease without esophagitis: Secondary | ICD-10-CM | POA: Diagnosis present

## 2024-05-29 DIAGNOSIS — I2119 ST elevation (STEMI) myocardial infarction involving other coronary artery of inferior wall: Secondary | ICD-10-CM | POA: Diagnosis present

## 2024-05-29 DIAGNOSIS — I10 Essential (primary) hypertension: Secondary | ICD-10-CM | POA: Diagnosis present

## 2024-05-29 DIAGNOSIS — I213 ST elevation (STEMI) myocardial infarction of unspecified site: Secondary | ICD-10-CM | POA: Diagnosis present

## 2024-05-29 DIAGNOSIS — R627 Adult failure to thrive: Secondary | ICD-10-CM | POA: Diagnosis present

## 2024-05-29 DIAGNOSIS — Z66 Do not resuscitate: Secondary | ICD-10-CM | POA: Diagnosis present

## 2024-05-29 DIAGNOSIS — N400 Enlarged prostate without lower urinary tract symptoms: Secondary | ICD-10-CM | POA: Diagnosis present

## 2024-05-29 DIAGNOSIS — I429 Cardiomyopathy, unspecified: Secondary | ICD-10-CM | POA: Diagnosis present

## 2024-05-29 DIAGNOSIS — J189 Pneumonia, unspecified organism: Secondary | ICD-10-CM | POA: Diagnosis present

## 2024-05-29 DIAGNOSIS — R7303 Prediabetes: Secondary | ICD-10-CM | POA: Diagnosis present

## 2024-05-29 DIAGNOSIS — Z885 Allergy status to narcotic agent status: Secondary | ICD-10-CM

## 2024-05-29 DIAGNOSIS — Z923 Personal history of irradiation: Secondary | ICD-10-CM

## 2024-05-29 DIAGNOSIS — Z87891 Personal history of nicotine dependence: Secondary | ICD-10-CM

## 2024-05-29 DIAGNOSIS — Z888 Allergy status to other drugs, medicaments and biological substances status: Secondary | ICD-10-CM

## 2024-05-29 DIAGNOSIS — Z8546 Personal history of malignant neoplasm of prostate: Secondary | ICD-10-CM | POA: Diagnosis not present

## 2024-05-29 DIAGNOSIS — C3412 Malignant neoplasm of upper lobe, left bronchus or lung: Secondary | ICD-10-CM | POA: Diagnosis present

## 2024-05-29 DIAGNOSIS — J9621 Acute and chronic respiratory failure with hypoxia: Principal | ICD-10-CM | POA: Diagnosis present

## 2024-05-29 DIAGNOSIS — A419 Sepsis, unspecified organism: Principal | ICD-10-CM | POA: Diagnosis present

## 2024-05-29 DIAGNOSIS — E872 Acidosis, unspecified: Secondary | ICD-10-CM | POA: Diagnosis present

## 2024-05-29 DIAGNOSIS — Z79899 Other long term (current) drug therapy: Secondary | ICD-10-CM

## 2024-05-29 DIAGNOSIS — R54 Age-related physical debility: Secondary | ICD-10-CM | POA: Diagnosis present

## 2024-05-29 DIAGNOSIS — Z8249 Family history of ischemic heart disease and other diseases of the circulatory system: Secondary | ICD-10-CM | POA: Diagnosis not present

## 2024-05-29 DIAGNOSIS — Z515 Encounter for palliative care: Secondary | ICD-10-CM

## 2024-05-29 DIAGNOSIS — I5023 Acute on chronic systolic (congestive) heart failure: Secondary | ICD-10-CM | POA: Diagnosis present

## 2024-05-29 DIAGNOSIS — Z880 Allergy status to penicillin: Secondary | ICD-10-CM

## 2024-05-29 DIAGNOSIS — R6520 Severe sepsis without septic shock: Secondary | ICD-10-CM | POA: Diagnosis present

## 2024-05-29 DIAGNOSIS — H9190 Unspecified hearing loss, unspecified ear: Secondary | ICD-10-CM | POA: Diagnosis present

## 2024-05-29 DIAGNOSIS — F17201 Nicotine dependence, unspecified, in remission: Secondary | ICD-10-CM

## 2024-05-29 LAB — BLOOD GAS, VENOUS
Acid-base deficit: 2.5 mmol/L — ABNORMAL HIGH (ref 0.0–2.0)
Bicarbonate: 24.2 mmol/L (ref 20.0–28.0)
Drawn by: 59063
O2 Saturation: 56.8 %
Patient temperature: 37.6
pCO2, Ven: 49 mmHg (ref 44–60)
pH, Ven: 7.3 (ref 7.25–7.43)
pO2, Ven: 37 mmHg (ref 32–45)

## 2024-05-29 LAB — PRO BRAIN NATRIURETIC PEPTIDE: Pro Brain Natriuretic Peptide: 7863 pg/mL — ABNORMAL HIGH

## 2024-05-29 LAB — GLUCOSE, CAPILLARY: Glucose-Capillary: 270 mg/dL — ABNORMAL HIGH (ref 70–99)

## 2024-05-29 LAB — CBG MONITORING, ED: Glucose-Capillary: 233 mg/dL — ABNORMAL HIGH (ref 70–99)

## 2024-05-29 LAB — BASIC METABOLIC PANEL WITH GFR
Anion gap: 13 (ref 5–15)
BUN: 14 mg/dL (ref 8–23)
CO2: 26 mmol/L (ref 22–32)
Calcium: 8.9 mg/dL (ref 8.9–10.3)
Chloride: 97 mmol/L — ABNORMAL LOW (ref 98–111)
Creatinine, Ser: 1 mg/dL (ref 0.61–1.24)
GFR, Estimated: >60 mL/min
Glucose, Bld: 239 mg/dL — ABNORMAL HIGH (ref 70–99)
Potassium: 3.8 mmol/L (ref 3.5–5.1)
Sodium: 136 mmol/L (ref 135–145)

## 2024-05-29 LAB — CBC
HCT: 43.6 % (ref 39.0–52.0)
Hemoglobin: 13.5 g/dL (ref 13.0–17.0)
MCH: 27.3 pg (ref 26.0–34.0)
MCHC: 31 g/dL (ref 30.0–36.0)
MCV: 88.1 fL (ref 80.0–100.0)
Platelets: 507 K/uL — ABNORMAL HIGH (ref 150–400)
RBC: 4.95 MIL/uL (ref 4.22–5.81)
RDW: 14.4 % (ref 11.5–15.5)
WBC: 31.9 K/uL — ABNORMAL HIGH (ref 4.0–10.5)
nRBC: 0 % (ref 0.0–0.2)

## 2024-05-29 LAB — RESP PANEL BY RT-PCR (RSV, FLU A&B, COVID)  RVPGX2
Influenza A by PCR: NEGATIVE
Influenza B by PCR: NEGATIVE
Resp Syncytial Virus by PCR: NEGATIVE
SARS Coronavirus 2 by RT PCR: NEGATIVE

## 2024-05-29 LAB — TROPONIN T, HIGH SENSITIVITY
Troponin T High Sensitivity: 53 ng/L — ABNORMAL HIGH (ref 0–19)
Troponin T High Sensitivity: 70 ng/L — ABNORMAL HIGH (ref 0–19)

## 2024-05-29 LAB — HEMOGLOBIN A1C
Hgb A1c MFr Bld: 7.2 % — ABNORMAL HIGH (ref 4.8–5.6)
Mean Plasma Glucose: 159.94 mg/dL

## 2024-05-29 LAB — LACTIC ACID, PLASMA: Lactic Acid, Venous: 5.3 mmol/L (ref 0.5–1.9)

## 2024-05-29 MED ORDER — CHLORHEXIDINE GLUCONATE CLOTH 2 % EX PADS
6.0000 | MEDICATED_PAD | Freq: Every day | CUTANEOUS | Status: DC
  Administered 2024-05-29: 6 via TOPICAL

## 2024-05-29 MED ORDER — IPRATROPIUM-ALBUTEROL 0.5-2.5 (3) MG/3ML IN SOLN
RESPIRATORY_TRACT | Status: AC
  Administered 2024-05-29: 3 mL via RESPIRATORY_TRACT
  Filled 2024-05-29: qty 3

## 2024-05-29 MED ORDER — POLYVINYL ALCOHOL 1.4 % OP SOLN: 1.0000 [drp] | Freq: Four times a day (QID) | OPHTHALMIC | Status: DC | PRN

## 2024-05-29 MED ORDER — LORAZEPAM 2 MG/ML IJ SOLN
1.0000 mg | INTRAMUSCULAR | Status: DC | PRN
  Administered 2024-05-29: 1 mg via INTRAVENOUS
  Filled 2024-05-29: qty 1

## 2024-05-29 MED ORDER — DIPHENHYDRAMINE HCL 50 MG/ML IJ SOLN: 12.5000 mg | INTRAMUSCULAR | Status: DC | PRN

## 2024-05-29 MED ORDER — ENOXAPARIN SODIUM 40 MG/0.4ML IJ SOSY: 40.0000 mg | PREFILLED_SYRINGE | INTRAMUSCULAR | Status: DC

## 2024-05-29 MED ORDER — SODIUM CHLORIDE 0.9 % IV SOLN
2.0000 g | Freq: Once | INTRAVENOUS | Status: AC
  Administered 2024-05-29: 2 g via INTRAVENOUS
  Filled 2024-05-29: qty 12.5

## 2024-05-29 MED ORDER — BISACODYL 10 MG RE SUPP: 10.0000 mg | Freq: Every day | RECTAL | Status: DC | PRN

## 2024-05-29 MED ORDER — MORPHINE SULFATE (PF) 2 MG/ML IV SOLN
2.0000 mg | Freq: Once | INTRAVENOUS | Status: AC
  Administered 2024-05-29: 2 mg via INTRAVENOUS
  Filled 2024-05-29: qty 1

## 2024-05-29 MED ORDER — ALBUTEROL SULFATE (2.5 MG/3ML) 0.083% IN NEBU: 2.5000 mg | INHALATION_SOLUTION | RESPIRATORY_TRACT | Status: DC | PRN

## 2024-05-29 MED ORDER — SODIUM CHLORIDE 0.9 % IV SOLN: 2.0000 g | Freq: Two times a day (BID) | INTRAVENOUS | Status: DC

## 2024-05-29 MED ORDER — PROCHLORPERAZINE EDISYLATE 10 MG/2ML IJ SOLN: 10.0000 mg | Freq: Two times a day (BID) | INTRAMUSCULAR | Status: DC | PRN

## 2024-05-29 MED ORDER — VANCOMYCIN HCL IN DEXTROSE 1-5 GM/200ML-% IV SOLN
1000.0000 mg | Freq: Once | INTRAVENOUS | Status: AC
  Administered 2024-05-29: 1000 mg via INTRAVENOUS
  Filled 2024-05-29: qty 200

## 2024-05-29 MED ORDER — PROCHLORPERAZINE 25 MG RE SUPP: 25.0000 mg | Freq: Two times a day (BID) | RECTAL | Status: DC | PRN

## 2024-05-29 MED ORDER — PROCHLORPERAZINE MALEATE 10 MG PO TABS: 10.0000 mg | ORAL_TABLET | Freq: Four times a day (QID) | ORAL | Status: DC | PRN

## 2024-05-29 MED ORDER — ALBUTEROL SULFATE (2.5 MG/3ML) 0.083% IN NEBU
2.5000 mg | INHALATION_SOLUTION | Freq: Once | RESPIRATORY_TRACT | Status: AC
  Administered 2024-05-29: 2.5 mg via RESPIRATORY_TRACT

## 2024-05-29 MED ORDER — HALOPERIDOL LACTATE 2 MG/ML PO CONC: 0.5000 mg | ORAL | Status: DC | PRN

## 2024-05-29 MED ORDER — NYSTATIN 100000 UNIT/GM EX POWD: Freq: Three times a day (TID) | CUTANEOUS | Status: DC | PRN

## 2024-05-29 MED ORDER — GLYCOPYRROLATE 0.2 MG/ML IJ SOLN
0.2000 mg | INTRAMUSCULAR | Status: DC | PRN
  Administered 2024-05-29: 0.2 mg via INTRAVENOUS
  Filled 2024-05-29: qty 1

## 2024-05-29 MED ORDER — BIOTENE DRY MOUTH MT LIQD: 15.0000 mL | OROMUCOSAL | Status: DC | PRN

## 2024-05-29 MED ORDER — HALOPERIDOL 0.5 MG PO TABS: 0.5000 mg | ORAL_TABLET | ORAL | Status: DC | PRN

## 2024-05-29 MED ORDER — LORAZEPAM 1 MG PO TABS: 1.0000 mg | ORAL_TABLET | ORAL | Status: DC | PRN

## 2024-05-29 MED ORDER — GLYCOPYRROLATE 1 MG PO TABS: 1.0000 mg | ORAL_TABLET | ORAL | Status: DC | PRN

## 2024-05-29 MED ORDER — GLYCOPYRROLATE 0.2 MG/ML IJ SOLN: 0.2000 mg | INTRAMUSCULAR | Status: DC | PRN

## 2024-05-29 MED ORDER — HALOPERIDOL LACTATE 5 MG/ML IJ SOLN: 0.5000 mg | INTRAMUSCULAR | Status: DC | PRN

## 2024-05-29 MED ORDER — ACETAMINOPHEN 650 MG RE SUPP: 650.0000 mg | Freq: Four times a day (QID) | RECTAL | Status: DC | PRN

## 2024-05-29 MED ORDER — LORAZEPAM 2 MG/ML PO CONC: 1.0000 mg | ORAL | Status: DC | PRN

## 2024-05-29 MED ORDER — METRONIDAZOLE 500 MG/100ML IV SOLN
500.0000 mg | Freq: Two times a day (BID) | INTRAVENOUS | Status: DC
  Filled 2024-05-29: qty 100

## 2024-05-29 MED ORDER — LORAZEPAM 2 MG/ML IJ SOLN
1.0000 mg | Freq: Once | INTRAMUSCULAR | Status: AC
  Administered 2024-05-29: 1 mg via INTRAVENOUS
  Filled 2024-05-29: qty 1

## 2024-05-29 MED ORDER — FUROSEMIDE 10 MG/ML IJ SOLN
40.0000 mg | Freq: Once | INTRAMUSCULAR | Status: AC
  Administered 2024-05-29: 40 mg via INTRAVENOUS
  Filled 2024-05-29: qty 4

## 2024-05-29 MED ORDER — ACETAMINOPHEN 325 MG PO TABS: 650.0000 mg | ORAL_TABLET | Freq: Four times a day (QID) | ORAL | Status: DC | PRN

## 2024-05-29 MED ORDER — FUROSEMIDE 10 MG/ML IJ SOLN
20.0000 mg | Freq: Once | INTRAMUSCULAR | Status: AC
  Administered 2024-05-29: 20 mg via INTRAVENOUS
  Filled 2024-05-29: qty 2

## 2024-05-29 MED ORDER — ALBUTEROL SULFATE (2.5 MG/3ML) 0.083% IN NEBU
INHALATION_SOLUTION | RESPIRATORY_TRACT | Status: AC
  Filled 2024-05-29: qty 3

## 2024-05-29 MED ORDER — IPRATROPIUM-ALBUTEROL 0.5-2.5 (3) MG/3ML IN SOLN: 3.0000 mL | Freq: Once | RESPIRATORY_TRACT | Status: AC

## 2024-05-29 MED ORDER — MORPHINE 100MG IN NS 100ML (1MG/ML) PREMIX INFUSION
1.0000 mg/h | INTRAVENOUS | Status: DC
  Administered 2024-05-29: 3 mg/h via INTRAVENOUS
  Filled 2024-05-29: qty 100

## 2024-05-29 MED ORDER — LINEZOLID 600 MG/300ML IV SOLN
600.0000 mg | Freq: Two times a day (BID) | INTRAVENOUS | Status: DC
  Filled 2024-05-29 (×3): qty 300

## 2024-05-29 MED ORDER — INSULIN ASPART 100 UNIT/ML IJ SOLN: 0.0000 [IU] | INTRAMUSCULAR | Status: DC

## 2024-05-29 MED ORDER — MORPHINE BOLUS VIA INFUSION
1.0000 mg | INTRAVENOUS | Status: DC | PRN
  Administered 2024-05-29 (×2): 1 mg via INTRAVENOUS

## 2024-05-30 ENCOUNTER — Ambulatory Visit (HOSPITAL_COMMUNITY): Admission: RE | Admit: 2024-05-30 | Source: Home / Self Care | Admitting: Urology

## 2024-05-30 ENCOUNTER — Inpatient Hospital Stay: Admitting: Oncology

## 2024-05-30 ENCOUNTER — Encounter (HOSPITAL_COMMUNITY): Admission: RE | Payer: Self-pay | Source: Home / Self Care

## 2024-05-30 SURGERY — CYSTOSCOPY WITH INSERTION OF UROLIFT
Anesthesia: General

## 2024-06-01 ENCOUNTER — Ambulatory Visit: Admitting: Urology

## 2024-06-02 ENCOUNTER — Ambulatory Visit: Admitting: Student

## 2024-06-03 LAB — CULTURE, BLOOD (ROUTINE X 2)
Culture: NO GROWTH
Culture: NO GROWTH
Special Requests: ADEQUATE
Special Requests: ADEQUATE

## 2024-06-26 NOTE — Progress Notes (Signed)
 I reviewed the chart, extensive medical history, was not felt to be a candidate for surgery for his lung cancer or radiation therapy, chest x-ray looks horrendous with extensive what appeared to be chronic fibrotic changes superimposed on either pulmonary edema or recurrence of pneumonia in a 84 year old relatively ill patient with recent hospitalization in December 2 week with new onset heart failure, new onset severe LV systolic dysfunction, there was question about palliative care as well.  Spoke with Dr. Olene, spoke with Crystal's daughter and also patient's son, explained to them that he was felt to be only a palliative oncology patient, critical aortic stenosis with severe cardiomyopathy, EKG suggestion of acute inferior STEMI and with respiratory distress that he would not be a good candidate to proceed with cardiac catheterization at this point.  Palliative care, comfort measures is fairly appropriate in this 84 year old gentleman who is extremely ill.  I spent 15 minutes on the telephone with both of them explaining that he is critically ill.  Crystal is on her way to the hospital, they do not want him intubated at this time, want to continue BiPAP for now, until she reaches and makes decision once reaches the hospital.  I have communicated to the ED not to intubate the patient for now.  Conservative therapy, medical therapy only and not a candidate for cardiac catheterization.     Gordy Bergamo, MD, [facility] 06/08/24, 5:22 AM [facility] [address] [place], [place] [address] Phone: [phone]. Fax:  [phone]

## 2024-06-26 NOTE — Progress Notes (Signed)
 Patient having significant air hunger and discomfort breathing. Bolus ativan  and morphine  drip while increasing drip rate.  More comfortable now.

## 2024-06-26 NOTE — ED Notes (Addendum)
 Dr Ladona [facility]) paged to Dr Geroldine @ [phone]

## 2024-06-26 NOTE — ED Provider Notes (Signed)
 " [facility] EMERGENCY DEPARTMENT AT [facility] Provider Note   CSN: [id number] Arrival date & time: 06-02-24  9579     Patient presents with: Shortness of Breath   Calvin Moreno is a 84 y.o. male.   Patient is an 84 year old male with past medical history of lung cancer, severe aortic stenosis, hypertension.  Patient brought from home by EMS for evaluation of shortness of breath.  He started yesterday with difficulty breathing, then worsened in the night.  He was found to be in significant respiratory distress by EMS and was placed on a nonrebreather, then transported here.  Patient has little additional history secondary to acuity of condition.       Prior to Admission medications  Medication Sig Start Date End Date Taking? Authorizing Provider  acetaminophen  (TYLENOL ) 500 MG tablet Take 1,000 mg by mouth every 6 (six) hours as needed for moderate pain (pain score 4-6).    [provider]  acyclovir (ZOVIRAX) 800 MG tablet Take 800 mg by mouth every 8 (eight) hours as needed. And give one tablet by mouth once daily.    [provider]  ALPRAZolam  (XANAX ) 0.5 MG tablet Take 1 tablet (0.5 mg total) by mouth at bedtime. 05/04/23   Abdul Fine, MD  alum & mag hydroxide-simeth (MAALOX PLUS) 400-400-40 MG/5ML suspension Take 5 mLs by mouth every 4 (four) hours as needed for indigestion.    [provider]  Ascorbic Acid (VITAMIN C) 1000 MG tablet Take 1,000 mg by mouth daily.    [provider]  Clobetasol Propionate Emulsion 0.05 % topical foam Apply 1 Application topically 2 (two) times daily. 04/29/24   [provider]  docusate sodium  (COLACE) 100 MG capsule Take 1 capsule (100 mg total) by mouth 2 (two) times daily. 04/02/23   Ricky Fines, MD  DULoxetine  (CYMBALTA ) 60 MG capsule Take 60 mg by mouth every evening. 04/11/24   [provider]  finasteride  (PROSCAR ) 5 MG tablet Take 1 tablet (5 mg total) by mouth daily.  09/23/23   McKenzie, Belvie CROME, MD  furosemide  (LASIX ) 40 MG tablet Take 1 tablet (40 mg total) by mouth daily. 05/14/24   Johnson, Clanford L, MD  gabapentin  (NEURONTIN ) 100 MG capsule Take 1 capsule (100 mg total) by mouth at bedtime. 10/05/23   Rogers Hai, MD  levothyroxine  (SYNTHROID ) 25 MCG tablet Take 25 mcg by mouth daily. 10/24/22   [provider]  Melatonin 3 MG TABS Take 3 mg by mouth at bedtime. For sleep    [provider]  pantoprazole  (PROTONIX ) 40 MG tablet Take 1 tablet (40 mg total) by mouth daily before breakfast. 03/03/23   Rourk, Lamar HERO, MD  Polyethyl Glycol-Propyl Glycol (SYSTANE OP) Place 1 drop into both eyes 2 (two) times daily as needed (dry eyes).    [provider]  polyethylene glycol (MIRALAX  / GLYCOLAX ) 17 g packet Take 17 g by mouth daily. Can be spread to every other day in the setting of diarrhea. 04/02/23   Ricky Fines, MD  potassium chloride  (KLOR-CON ) 10 MEQ tablet Take 1 tablet (10 mEq total) by mouth daily. 05/14/24   Johnson, Clanford L, MD  psyllium (HYDROCIL/METAMUCIL) 95 % PACK Take 1 packet by mouth 2 (two) times daily. 04/02/23   Ricky Fines, MD  senna-docusate (SENOKOT-S) 8.6-50 MG tablet Take 2 tablets by mouth at bedtime. 05/14/24   Johnson, Clanford L, MD  silodosin  (RAPAFLO ) 8 MG CAPS capsule Take 1 capsule (8 mg total) by  mouth 2 (two) times daily. 09/23/23   McKenzie, Belvie CROME, MD  simvastatin  (ZOCOR ) 20 MG tablet Take 20 mg by mouth every evening.    [provider]  spironolactone  (ALDACTONE ) 25 MG tablet Take 0.5 tablets (12.5 mg total) by mouth daily. 05/15/24   Johnson, Clanford L, MD  traMADol  (ULTRAM ) 50 MG tablet Take 25-50 mg by mouth 2 (two) times daily as needed for moderate pain (pain score 4-6). 05/28/23   [provider]  vitamin B-12 (CYANOCOBALAMIN ) 1000 MCG tablet Take 2,000 mcg by mouth daily.    [provider]    Allergies: Norvasc  [amlodipine  besylate], Codeine,  and Amoxicillin    Review of Systems  Unable to perform ROS: Acuity of condition    Updated Vital Signs BP (!) 102/57   Pulse 98   Temp 99.7 F (37.6 C) (Rectal)   Resp (!) 29   Ht 5' 6 (1.676 m)   Wt 72.2 kg   SpO2 91%   BMI 25.69 kg/m   Physical Exam Vitals and nursing note reviewed.  Constitutional:      General: He is not in acute distress.    Appearance: He is well-developed. He is not diaphoretic.  HENT:     Head: Normocephalic and atraumatic.  Cardiovascular:     Rate and Rhythm: Normal rate and regular rhythm.     Heart sounds: No murmur heard.    No friction rub.  Pulmonary:     Effort: Tachypnea, accessory muscle usage and respiratory distress present.     Breath sounds: Examination of the right-middle field reveals rales. Examination of the left-middle field reveals rales. Examination of the right-lower field reveals rales. Examination of the left-lower field reveals rales. Rales present. No wheezing.  Abdominal:     General: Bowel sounds are normal. There is no distension.     Palpations: Abdomen is soft.     Tenderness: There is no abdominal tenderness.  Musculoskeletal:        General: Normal range of motion.     Cervical back: Normal range of motion and neck supple.  Skin:    General: Skin is warm and dry.  Neurological:     Mental Status: He is alert and oriented to person, place, and time.     Coordination: Coordination normal.     (all labs ordered are listed, but only abnormal results are displayed) Labs Reviewed  BASIC METABOLIC PANEL WITH GFR - Abnormal; Notable for the following components:      Result Value   Chloride 97 (*)    Glucose, Bld 239 (*)    All other components within normal limits  CBC - Abnormal; Notable for the following components:   WBC 31.9 (*)    Platelets 507 (*)    All other components within normal limits  PRO BRAIN NATRIURETIC PEPTIDE - Abnormal; Notable for the following components:   Pro Brain Natriuretic  Peptide 7,863.0 (*)    All other components within normal limits  CBG MONITORING, ED - Abnormal; Notable for the following components:   Glucose-Capillary 233 (*)    All other components within normal limits  TROPONIN T, HIGH SENSITIVITY - Abnormal; Notable for the following components:   Troponin T High Sensitivity 53 (*)    All other components within normal limits  CULTURE, BLOOD (ROUTINE X 2)  CULTURE, BLOOD (ROUTINE X 2)  LACTIC ACID, PLASMA  BLOOD GAS, VENOUS    EKG: EKG Interpretation Date/Time:  Sunday Jun 22, 2024 04:25:40 EST  Ventricular Rate:  98 PR Interval:  185 QRS Duration:  147 QT Interval:  369 QTC Calculation: 472 R Axis:   73  Text Interpretation: Sinus or ectopic atrial rhythm IVCD, consider atypical LBBB Inferior infarct, acute (RCA) Probable RV involvement, suggest recording right precordial leads >>> Acute MI <<< Confirmed by Geroldine Berg ([id number]) on 06/21/2024 5:30:48 AM  Radiology: [facility] Chest Portable 1 View Result Date: Jun 21, 2024 EXAM: 1 VIEW(S) XRAY OF THE CHEST 06-21-24 04:50:16 AM COMPARISON: 05/13/2024 CLINICAL HISTORY: sob FINDINGS: LUNGS AND PLEURA: Interval worsening of bilateral lung aeration with patchy consolidations showing air bronchograms and underlying reticulonodular opacities. Trace bilateral pleural effusions. No pneumothorax. HEART AND MEDIASTINUM: Cardiac paddles on left chest. Atherosclerotic plaque. No acute abnormality of the cardiac and mediastinal silhouettes. BONES AND SOFT TISSUES: No acute osseous abnormality. IMPRESSION: 1. Interval worsening of bilateral lung aeration with patchy consolidations showing air bronchograms and underlying reticulonodular opacities. 2. Trace bilateral pleural effusions. Electronically signed by: Waddell Calk MD Jun 21, 2024 05:15 AM EST RP Workstation: [id number]     Procedures   Medications Ordered in the ED  vancomycin  (VANCOCIN ) IVPB 1000 mg/200 mL premix (1,000 mg Intravenous New Bag/Given Jun 21, 2024  0511)  ceFEPIme  (MAXIPIME ) 2 g in sodium chloride  0.9 % 100 mL IVPB (2 g Intravenous New Bag/Given 21-Jun-2024 0511)  LORazepam  (ATIVAN ) injection 1 mg (has no administration in time range)  furosemide  (LASIX ) injection 40 mg (40 mg Intravenous Given 06-21-2024 0454)  ipratropium-albuterol  (DUONEB) 0.5-2.5 (3) MG/3ML nebulizer solution 3 mL (3 mLs Nebulization Given 06-21-2024 0518)  albuterol  (PROVENTIL ) (2.5 MG/3ML) 0.083% nebulizer solution 2.5 mg (2.5 mg Nebulization Given 06/21/2024 0518)  albuterol  (PROVENTIL ) (2.5 MG/3ML) 0.083% nebulizer solution (  Given 21-Jun-2024 0518)                                    Medical Decision Making Amount and/or Complexity of Data Reviewed Labs: ordered. Radiology: ordered.  Risk Prescription drug management. Decision regarding hospitalization.   Patient is an 84 year old male brought in by EMS for respiratory distress.  Patient has extensive past medical history including lung cancer and severe aortic stenosis.  He arrives here in significant respiratory distress even while on nonrebreather.  Shortly after arrival, he was placed on BiPAP.  Workup initiated including CBC, metabolic panel, BNP, and troponin.  He has a white count of 31,000, BNP of nearly 8000 and lactate of 5.3.  Chest x-ray shows interval worsening of bilateral lung aeration with patchy consolidations.  Patient given vancomycin  and cefepime  for pneumonia/sepsis.  He was also given a DuoNeb breathing treatment.  He was continued on BiPAP.  Upon arrival, his EKG was suggestive of a possible inferior STEMI.  I spoke with Dr. Ladona who was on for STEMI call.  He spoke with the patient's daughter and given the clinical picture and patient's medical history, did not feel as though he was a candidate for intervention.  The family is also desiring a more comfort care approach.  He will be admitted to the hospitalist service for further care.  CRITICAL CARE Performed by: Berg Geroldine Total critical care  time: 40 minutes Critical care time was exclusive of separately billable procedures and treating other patients. Critical care was necessary to treat or prevent imminent or life-threatening deterioration. Critical care was time spent personally by me on the following activities: development of treatment plan with patient and/or surrogate as well as nursing, discussions with consultants, evaluation  of patient's response to treatment, examination of patient, obtaining history from patient or surrogate, ordering and performing treatments and interventions, ordering and review of laboratory studies, ordering and review of radiographic studies, pulse oximetry and re-evaluation of patient's condition.      Final diagnoses:  None    ED Discharge Orders     None          Geroldine Berg, MD 03-Jun-2024 [phone]  "

## 2024-06-26 NOTE — ED Triage Notes (Signed)
 [facility] for cc SOB/ 72% room air  placed on 15l NRB 96% Coughing brown sputum Recently here for pneumonia Hx lung cancer  Ems gave 125 solumedrol 18l ac

## 2024-06-26 NOTE — ED Notes (Signed)
 ED Provider at bedside.

## 2024-06-26 NOTE — H&P (Addendum)
 " History and Physical  Calvin Moreno:[id number] DOB: May 15, 1941 DOA: 06-04-2024  Referring physician: Dr. Geroldine, EDP. PCP: Shona Norleen PEDLAR, MD  Outpatient Specialists: Medical oncology, cardiology. Patient coming from: Home.  Chief Complaint: Respiratory distress.  HPI: Calvin Moreno is a 84 y.o. male with medical history significant for non-small cell lung cancer, prostate cancer status post SBRT, peripheral artery disease, chronic HFrEF, severe aortic stenosis with severe cardiomyopathy, hypertension, hyperlipidemia, prediabetes, BPH, GERD, recently admitted from 12/18 to 05/14/2024 for acute HFrEF and acute hypoxic respiratory failure, he was discharged on 2 L nasal cannula, who presents to the ER due to respiratory distress associated with coughing up brown sputum and hypoxia.  EMS was activated and the patient was placed on nonrebreather and brought in to the ER for further evaluation.  In the ER, severely tachycardic 156, tachypneic 47, and severely hypoxic.  The patient was switched to BiPAP.  Chest x-ray revealed interval worsening of bilateral lung aeration with patchy consolidation showing air bronchograms and underlying reticulonodular opacities.  Trace bilateral pleural effusions.  proBNP [id number].  High-sensitivity troponin 53.  WBC 31.9.  Platelet count 507.  Lactic acid 5.3.  Twelve-lead EKG concern for possible acute MI with atypical LBBB.  EDP discussed the case with cardiology and family.  The patient's daughter made decision for DNR/DNI, no procedure, including no heart cath.  EDP requested admission for further management.  Admitted by [facility], hospitalist service.  ED Course: Temperature 99.7.  BP 102/75, pulse 110, respiratory rate 37, O2 saturation 94% on BiPAP.  Review of Systems: Review of systems as noted in the HPI. All other systems reviewed and are negative.   Past Medical History:  Diagnosis Date   Aortic stenosis, mod 2021   And insufficiency, evaluation by Dr.  Edith in 2000   Arthritis    Chronic kidney disease    Borderline; creatinine of 1.6 in 08/2009, but 1.15 in 05/2010   Chronic sinusitis    By MRI   Diverticulosis 2013   GERD (gastroesophageal reflux disease)    Hearing impaired    Hemorrhoids 2006   Hiatal hernia 2006   Hyperlipidemia    Elevated triglycerides   Hypertension    Mild internal carotid artery plaque on MRI/MRA in 2002   Hypothyroidism    IBS (irritable bowel syndrome)    Insomnia    Left bundle branch block 2012   2012   Metabolic syndrome    Fasting hyperglycemia   Nephrolithiasis 08/2009   2011   Obesity    Proctitis    radiation induced   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   s/p XRT   Seasonal allergies    Stroke (HCC)    TIA, no residual, seen on MRI   Tobacco abuse, in remission    60 pack years; quit in 1999   Uses hearing aid    bilateral   Past Surgical History:  Procedure Laterality Date   ANKLE FRACTURE SURGERY     left   BIOPSY  12/31/2022   Procedure: BIOPSY;  Surgeon: Shaaron Lamar HERO, MD;  Location: [facility];  Service: Endoscopy;;   BRONCHIAL BIOPSY  05/14/2023   Procedure: BRONCHIAL BIOPSIES;  Surgeon: Malka Domino, MD;  Location: [facility];  Service: Pulmonary;;   BRONCHIAL NEEDLE ASPIRATION BIOPSY  05/14/2023   Procedure: BRONCHIAL NEEDLE ASPIRATION BIOPSIES;  Surgeon: Malka Domino, MD;  Location: [facility] ENDOSCOPY;  Service: Pulmonary;;   CATARACT EXTRACTION Left    CATARACT EXTRACTION W/PHACO Right 12/19/2013  Procedure: CATARACT EXTRACTION PHACO AND INTRAOCULAR LENS PLACEMENT RIGHT EYE CDE=14.78;  Surgeon: Cherene Mania, MD;  Location: [facility];  Service: Ophthalmology;  Laterality: Right;   CHOLECYSTECTOMY     COLONOSCOPY  2006   Dr. Retia mucosa throughout colon, moderate internal hemorrhoids. bx= benign colonic mucosa   COLONOSCOPY  12/03/2011   Dr. Shaaron- colonic diverticulosis, radiation-induced proctitis- s/p APC ablation, no microscopic colitis on bx    COLONOSCOPY WITH PROPOFOL  N/A 06/12/2020   Procedure: COLONOSCOPY WITH PROPOFOL ;  Surgeon: Shaaron Lamar HERO, MD;  Location: [facility];  Service: Endoscopy;  Laterality: N/A;  2:45pm   ESOPHAGOGASTRODUODENOSCOPY  2006   Dr. Celestia- small hiatal hernia and slightly reddened distal esophagus o/w normal   ESOPHAGOGASTRODUODENOSCOPY (EGD) WITH PROPOFOL  N/A 12/31/2022   Procedure: ESOPHAGOGASTRODUODENOSCOPY (EGD) WITH PROPOFOL ;  Surgeon: Shaaron Lamar HERO, MD;  Location: [facility];  Service: Endoscopy;  Laterality: N/A;  1245pm, asa 3   FLEXIBLE SIGMOIDOSCOPY N/A 07/13/2013   MFM:Wzncjdrlojm changes of the rectum with active oozing consistent with chronic radiation proctitis.  Otherwise, negative sigmoidoscopy to 40 cm.Status post argon plasma coagulation ablation    FLEXIBLE SIGMOIDOSCOPY N/A 04/02/2023   Procedure: FLEXIBLE SIGMOIDOSCOPY;  Surgeon: Cinderella Deatrice FALCON, MD;  Location: [facility];  Service: Endoscopy;  Laterality: N/A;   HOT HEMOSTASIS N/A 07/13/2013   Procedure: HOT HEMOSTASIS (ARGON PLASMA COAGULATION/BICAP);  Surgeon: Lamar HERO Shaaron, MD;  Location: [facility];  Service: Endoscopy;  Laterality: N/A;   HOT HEMOSTASIS  04/02/2023   Procedure: HOT HEMOSTASIS (ARGON PLASMA COAGULATION/BICAP);  Surgeon: Cinderella Deatrice FALCON, MD;  Location: [facility];  Service: Endoscopy;;   Ileocolonoscopy  12/03/2011   MFM:Rnonwpr diverticulosis. Radiation-induced proctitis status post APC ablation. Status post segmental colon Biopsy   MALONEY DILATION N/A 12/31/2022   Procedure: MALONEY DILATION;  Surgeon: Shaaron Lamar HERO, MD;  Location: [facility];  Service: Endoscopy;  Laterality: N/A;   TONSILLECTOMY      Social History:  reports that he quit smoking about 26 years ago. His smoking use included cigarettes. He started smoking about 66 years ago. He has a 60 pack-year smoking history. He has never been exposed to tobacco smoke. He has never used smokeless tobacco. He reports that he does not  drink alcohol  and does not use drugs.   Allergies[1]  Family History  Problem Relation Age of Onset   Heart failure Mother    Heart attack Father    Heart failure Brother        bladder cancer (agent orange exposure)   Cirrhosis Brother        chronic viral hepatitis   Colon cancer Neg Hx       Prior to Admission medications  Medication Sig Start Date End Date Taking? Authorizing Provider  acetaminophen  (TYLENOL ) 500 MG tablet Take 1,000 mg by mouth every 6 (six) hours as needed for moderate pain (pain score 4-6).    [provider]  acyclovir (ZOVIRAX) 800 MG tablet Take 800 mg by mouth every 8 (eight) hours as needed. And give one tablet by mouth once daily.    [provider]  ALPRAZolam  (XANAX ) 0.5 MG tablet Take 1 tablet (0.5 mg total) by mouth at bedtime. 05/04/23   Abdul Fine, MD  alum & mag hydroxide-simeth (MAALOX PLUS) 400-400-40 MG/5ML suspension Take 5 mLs by mouth every 4 (four) hours as needed for indigestion.    [provider]  Ascorbic Acid (VITAMIN C) 1000 MG tablet Take 1,000 mg by mouth  daily.    [provider]  Clobetasol Propionate Emulsion 0.05 % topical foam Apply 1 Application topically 2 (two) times daily. 04/29/24   [provider]  docusate sodium  (COLACE) 100 MG capsule Take 1 capsule (100 mg total) by mouth 2 (two) times daily. 04/02/23   Ricky Fines, MD  DULoxetine  (CYMBALTA ) 60 MG capsule Take 60 mg by mouth every evening. 04/11/24   [provider]  finasteride  (PROSCAR ) 5 MG tablet Take 1 tablet (5 mg total) by mouth daily. 09/23/23   McKenzie, Belvie CROME, MD  furosemide  (LASIX ) 40 MG tablet Take 1 tablet (40 mg total) by mouth daily. 05/14/24   Johnson, Clanford L, MD  gabapentin  (NEURONTIN ) 100 MG capsule Take 1 capsule (100 mg total) by mouth at bedtime. 10/05/23   Rogers Hai, MD  levothyroxine  (SYNTHROID ) 25 MCG tablet Take 25 mcg by mouth daily. 10/24/22   [provider]   Melatonin 3 MG TABS Take 3 mg by mouth at bedtime. For sleep    [provider]  pantoprazole  (PROTONIX ) 40 MG tablet Take 1 tablet (40 mg total) by mouth daily before breakfast. 03/03/23   Rourk, Lamar HERO, MD  Polyethyl Glycol-Propyl Glycol (SYSTANE OP) Place 1 drop into both eyes 2 (two) times daily as needed (dry eyes).    [provider]  polyethylene glycol (MIRALAX  / GLYCOLAX ) 17 g packet Take 17 g by mouth daily. Can be spread to every other day in the setting of diarrhea. 04/02/23   Ricky Fines, MD  potassium chloride  (KLOR-CON ) 10 MEQ tablet Take 1 tablet (10 mEq total) by mouth daily. 05/14/24   Johnson, Clanford L, MD  psyllium (HYDROCIL/METAMUCIL) 95 % PACK Take 1 packet by mouth 2 (two) times daily. 04/02/23   Ricky Fines, MD  senna-docusate (SENOKOT-S) 8.6-50 MG tablet Take 2 tablets by mouth at bedtime. 05/14/24   Johnson, Clanford L, MD  silodosin  (RAPAFLO ) 8 MG CAPS capsule Take 1 capsule (8 mg total) by mouth 2 (two) times daily. 09/23/23   McKenzie, Belvie CROME, MD  simvastatin  (ZOCOR ) 20 MG tablet Take 20 mg by mouth every evening.    [provider]  spironolactone  (ALDACTONE ) 25 MG tablet Take 0.5 tablets (12.5 mg total) by mouth daily. 05/15/24   Johnson, Clanford L, MD  traMADol  (ULTRAM ) 50 MG tablet Take 25-50 mg by mouth 2 (two) times daily as needed for moderate pain (pain score 4-6). 05/28/23   [provider]  vitamin B-12 (CYANOCOBALAMIN ) 1000 MCG tablet Take 2,000 mcg by mouth daily.    [provider]    Physical Exam: BP (!) 102/57   Pulse 98   Temp 99.7 F (37.6 C) (Rectal)   Resp (!) 29   Ht 5' 6 (1.676 m)   Wt 72.2 kg   SpO2 94%   BMI 25.69 kg/m   General: 84 y.o. year-old male well developed well nourished in no acute distress.  Alert and oriented x3. Cardiovascular: Tachycardic with no rubs or gallops.  No thyromegaly or JVD noted.  No lower extremity edema. 2/4 pulses in all 4 extremities. Respiratory: On  BiPAP.  Diffuse rales bilaterally.  Poor inspiratory effort. Abdomen: Soft nontender nondistended with normal bowel sounds x4 quadrants. Muskuloskeletal: No cyanosis, clubbing or edema noted bilaterally Neuro: CN II-XII intact, strength, sensation, reflexes Skin: No ulcerative lesions noted or rashes Psychiatry: Judgement and insight appear normal. Mood is appropriate for condition and setting          Labs on Admission:  Basic Metabolic Panel: Recent Labs  Lab 06/27/2024 0426  NA 136  K 3.8  CL 97*  CO2 26  GLUCOSE 239*  BUN 14  CREATININE 1.00  CALCIUM 8.9   Liver Function Tests: No results for input(s): AST, ALT, ALKPHOS, BILITOT, PROT, ALBUMIN in the last 168 hours. No results for input(s): LIPASE, AMYLASE in the last 168 hours. No results for input(s): AMMONIA in the last 168 hours. CBC: Recent Labs  Lab 06-27-2024 0426  WBC 31.9*  HGB 13.5  HCT 43.6  MCV 88.1  PLT 507*   Cardiac Enzymes: No results for input(s): CKTOTAL, CKMB, CKMBINDEX, TROPONINI in the last 168 hours.  BNP (last 3 results) No results for input(s): BNP in the last 8760 hours.  ProBNP (last 3 results) Recent Labs    05/13/24 0456 05/14/24 0253 Jun 27, 2024 0426  PROBNP 5,125.0* 4,483.0* 7,863.0*    CBG: Recent Labs  Lab 06/27/2024 0426  GLUCAP 233*    Radiological Exams on Admission: DG Chest Portable 1 View Result Date: 06-27-2024 EXAM: 1 VIEW(S) XRAY OF THE CHEST 06/27/2024 04:50:16 AM COMPARISON: 05/13/2024 CLINICAL HISTORY: sob FINDINGS: LUNGS AND PLEURA: Interval worsening of bilateral lung aeration with patchy consolidations showing air bronchograms and underlying reticulonodular opacities. Trace bilateral pleural effusions. No pneumothorax. HEART AND MEDIASTINUM: Cardiac paddles on left chest. Atherosclerotic plaque. No acute abnormality of the cardiac and mediastinal silhouettes. BONES AND SOFT TISSUES: No acute osseous abnormality. IMPRESSION: 1. Interval  worsening of bilateral lung aeration with patchy consolidations showing air bronchograms and underlying reticulonodular opacities. 2. Trace bilateral pleural effusions. Electronically signed by: Waddell Calk MD 06/27/2024 05:15 AM EST RP Workstation: [id number]    EKG: I independently viewed the EKG done and my findings are as followed: Atypical LBBB rate 98, QTc 472.  Assessment/Plan Present on Admission:  Acute on chronic hypoxic respiratory failure (HCC)  Principal Problem:   Acute on chronic hypoxic respiratory failure (HCC)  Acute on chronic hypoxic respiratory failure secondary to multifocal pneumonia, pulmonary edema, bilateral pleural effusions, POA On 2 L nasal cannula at baseline, was discharged on 2 L nasal cannula on 05/14/2024 Currently requiring BiPAP to maintain O2 saturation above 92% Continue BiPAP, wean off as tolerated Alternate with heated high flow as tolerated.  Severe sepsis secondary to multifocal pneumonia Lactic acidosis 5.3 Leukocytosis 31K Tachycardic, tachypneic Diffuse infiltrates, multilobular, on chest x-ray suggestive of multifocal pneumonia Start IV cefepime , IV Flagyl , and IV linezolid  Follow peripheral blood cultures x 2 Maintain MAP greater than 65  Lactic acidosis secondary to the above Management as stated above Trend lactic acid  Acute on chronic HFrEF Last 2D echo done on 05/03/2024 revealed LVEF 25 to 30% proBNP greater than 7800, pulmonary edema on chest x-ray IV Lasix  40 mg x 1 in the ER Add IV Lasix  20 mg x 1 Closely monitor volume status Monitor strict I's and O's and daily weight  Elevated troponin, secondary to suggestive ACS and demand ischemia in the setting of severe sepsis, acute on chronic HFrEF Initial troponin 53 Abnormal twelve-lead EKG, EDP discussed with cardiology, Dr. Ladona.  Family declines invasive procedures.  Bilateral pleural effusions, seen on chest x-ray, suspect cardiogenic Received 1 dose of IV Lasix  40  mg in the ER 1 dose IV Lasix  20 mg x 1 added. Continue BiPAP  Abnormal 12-lead EKG Suggestive of acute inferior STEMI Family has declined invasive procedures  Prediabetes with hyperglycemia Last hemoglobin A1c 5.9 in 2012 Follow updated hemoglobin A1c Insulin  sliding scale every 4 hours while NPO  Strict n.p.o. while on BiPAP.  Non-small cell lung cancer Poor prognosis Follows with oncology Dr. Davonna  Goals of care DNR DNI per family Palliative care medicine consulted to assist with establishing goals of care.   Critical care time: 55 minutes.   DVT prophylaxis: Subcu Lovenox  daily.  Code Status: DNR/DNI.  Family Communication: The patient's daughter and wife at bedside.  Disposition Plan: Admitted to stepdown unit.  Consults called: Palliative care medicine.  Admission status: Inpatient status.   Status is: Inpatient The patient requires at least 2 midnights for further evaluation and treatment of present condition.   Terry LOISE Hurst MD [facility] Pager [phone]  If 7PM-7AM, please contact night-coverage [email or web address] Password [email or web address]  06/14/2024, 6:00 AM      [1]  Allergies Allergen Reactions   Norvasc  [Amlodipine  Besylate] Swelling    Ankle swelling   Codeine Nausea And Vomiting   Amoxicillin Rash   "

## 2024-06-26 NOTE — ED Notes (Signed)
 Delo MD at bedside. Notified of Lactic Acid critical of 5.3

## 2024-06-26 NOTE — Death Summary Note (Addendum)
 " DEATH SUMMARY   Patient Details  Name: Calvin Moreno MRN: [id number] DOB: [birth year]  Admission/Discharge Information   Admit Date:  2024-06-20  Date of Death: Date of Death: 06-20-2024  Time of Death: Time of Death: 1000  Length of Stay: 0  Referring Physician: Shona Norleen PEDLAR, MD   Reason(s) for Hospitalization  84 y.o. male with medical history significant for non-small cell lung cancer, prostate cancer status post SBRT, peripheral artery disease, chronic HFrEF, hypertension, hyperlipidemia, prediabetes, BPH, GERD, recently admitted from 12/18 to 05/14/2024 for acute HFrEF and acute hypoxic respiratory failure, he was discharged on 2 L nasal cannula, who presents to the ER due to respiratory distress associated with coughing up brown sputum and hypoxia.  EMS was activated and the patient was placed on nonrebreather and brought her to the ER for further evaluation.   In the ER, severely tachycardic 156, tachypneic 47 and hypoxic.  The patient was switched to BiPAP.  Chest x-ray revealed interval worsening of bilateral lung aeration with patchy consolidation showing air bronchograms and underlying reticulonodular opacities.  Trace bilateral pleural effusions.  proBNP [id number].  High-sensitivity troponin 53.  WBC 31.9.  Platelet count 507.  Lactic acid 5.3.  Twelve-lead EKG concern for possible acute MI with atypical LBBB.   EDP discussed the case with cardiology and family.  The patient's daughter made decision for DNR/DNI, no procedure, including no heart cath.  EDP requested admission for further management.  Admitted by [facility], hospitalist service.  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  Patient's daughter and son spoke with cardiologist Dr. Margaretann who reviewed chart and EKG and felt the EKG suggestion of acute inferior STEMI and given respiratory distress felt he would not be a good candidate approved to proceed with cardiac catheterization at this point and recommended palliative comfort measures.  Interdisciplinary goals of care meeting with Dr. Vicci, bedside RN Reyes Judge and family decided to transition to full comfort care.  Patient was started on IV morphine  infusion.  Patient expired at 10 AM pronounced by RN.  Pertinent Labs and Studies  Significant Diagnostic Studies DG Chest Portable 1 View Result Date: 2024/06/20 EXAM: 1 VIEW(S) XRAY OF THE CHEST Jun 20, 2024 04:50:16 AM COMPARISON: 05/13/2024 CLINICAL HISTORY: sob FINDINGS: LUNGS AND PLEURA: Interval worsening of bilateral lung aeration with patchy consolidations showing air bronchograms and underlying reticulonodular opacities. Trace bilateral pleural effusions. No pneumothorax. HEART AND MEDIASTINUM: Cardiac paddles on left chest. Atherosclerotic plaque. No acute abnormality of the cardiac and mediastinal silhouettes. BONES AND SOFT TISSUES: No acute osseous abnormality. IMPRESSION: 1. Interval worsening of bilateral lung aeration with patchy consolidations showing air bronchograms and underlying reticulonodular opacities. 2. Trace bilateral  pleural  effusions. Electronically signed by: Waddell Calk MD 06/19/24 05:15 AM EST RP Workstation: [id number]   DG Chest Port 1 View Result Date: 05/13/2024 CLINICAL DATA:  Acute heart failure with reduced ejection fraction. EXAM: PORTABLE CHEST 1 VIEW COMPARISON:  05/12/2024.  Chest CTA dated 05/12/2024. FINDINGS: Normal-sized heart. Tortuous and partially calcified thoracic aorta. Bilateral patchy densities with improvement. Small bilateral pleural effusions with probable loculation superiorly on the left. Chronic interstitial prominence. Diffuse osteopenia. Thoracic spine degenerative changes. IMPRESSION: 1. Mildly improved bilateral pneumonia. 2. Small bilateral pleural effusions with probable loculation superiorly on the left. 3. Chronic interstitial lung disease. Electronically Signed   By: Elspeth Bathe M.D.   On: 05/13/2024 15:38   CT Angio Chest Pulmonary Embolism (PE) W or WO Contrast Result Date: 05/12/2024 EXAM: CTA CHEST 05/12/2024 07:21:54 AM TECHNIQUE: CTA of the chest was performed after the administration of intravenous contrast. Multiplanar reformatted images are provided for review. MIP images are provided for review. Automated exposure control, iterative reconstruction, and/or weight based adjustment of the mA/kV was utilized to reduce the radiation dose to as low as reasonably achievable. COMPARISON: CT of the chest dated 04/06/2024. CLINICAL HISTORY: sob FINDINGS: PULMONARY ARTERIES: Pulmonary arteries are adequately opacified for evaluation. No acute pulmonary embolus. The right main pulmonary artery is dilated, measuring approximately 3.0 cm in diameter. MEDIASTINUM: The heart and pericardium demonstrate no acute abnormality. There is calcific coronary artery disease present. There is also calcification of the mitral annulus. The thoracic aorta is normal in caliber, but demonstrates mild-to-moderate calcific atheromatous disease. LYMPH NODES: No mediastinal, hilar or axillary  lymphadenopathy. LUNGS AND PLEURA: There has been significant interval worsening of opacification/consolidation of the left upper lobe, enveloping the ovoid mass noted within the left upper lobe on the previous study. There is also patchy consolidation of the right upper lobe and superior segment of the right lower lobe. There is also patchy opacification/consolidation of the superior segment of the left lower lobe. Since the previous study, the patient has developed moderate bilateral pleural effusions. No pneumothorax. UPPER ABDOMEN: Limited images of the upper abdomen are unremarkable. SOFT TISSUES AND BONES: No acute bone or soft tissue abnormality. IMPRESSION: 1. No evidence of pulmonary embolus. 2. Significant interval worsening of opacification/consolidation of the left upper lobe, enveloping the ovoid mass noted within the left upper lobe on the previous study. 3. Patchy consolidation of the right upper lobe and superior segment of the right lower lobe. 4. Patchy opacification/consolidation of the superior segment of the left lower lobe. 5. Moderate bilateral pleural effusions, new since the previous study. 6. Dilated right main pulmonary artery, measuring approximately 3.0 cm in diameter, which can be seen with pulmonary arterial hypertension. 7. Calcific coronary artery disease and mitral annular calcification. Electronically signed by: Evalene Coho MD 05/12/2024 07:50 AM EST RP Workstation: [id number]   DG Chest 1 View Result Date: 05/12/2024 EXAM: 1 VIEW(S) XRAY OF THE CHEST 05/12/2024 05:09:00 AM COMPARISON: PET CT 04/28/2024 CLINICAL HISTORY: SOB FINDINGS: LUNGS AND PLEURA: Right mid lung perihilar opacity is new from the previous exam. Left upper lobe mass. Moderate diffuse interstitial edema. Small right pleural effusion. No pneumothorax. HEART AND MEDIASTINUM: Aortic calcification. BONES AND SOFT TISSUES: No acute osseous abnormality. IMPRESSION: 1. Moderate diffuse interstitial edema. 2.  Small right pleural effusion. 3. New right mid lung perihilar opacity, compatible with pneumonia . 4. Left upper lobe mass, as described on the comparison PET CT from 04/28/2024 . Electronically signed by: Waddell Calk MD 05/12/2024 06:02 AM EST RP Workstation:  [id number]   ECHOCARDIOGRAM COMPLETE Result Date: 05/03/2024    ECHOCARDIOGRAM REPORT   Patient Name:   DELWIN RACZKOWSKI Date of Exam: 05/03/2024 Medical Rec #:  [id number]        Height:       66.0 in Accession #:    [id number]       Weight:       176.0 lb Date of Birth:  07-07-1940        BSA:          1.894 m Patient Age:    83 years         BP:           106/67 mmHg Patient Gender: M                HR:           97 bpm. Exam Location:  [facility] Procedure: 2D Echo, 3D Echo, Cardiac Doppler, Color Doppler and Strain Analysis            (Both Spectral and Color Flow Doppler were utilized during            procedure). Indications:    Aortic Stenosis  History:        Patient has prior history of Echocardiogram examinations, most                 recent 09/18/2023. Stroke, Arrythmias:LBBB; Risk Factors:Former                 Smoker, Hypertension and Dyslipidemia. Prostate cancer (HCC)                 (From Hx).  Sonographer:    Aida Pizza RCS Referring Phys: [id number] DORN FALCON BRANCH  Sonographer Comments: Global longitudinal strain was attempted. IMPRESSIONS  1. No LV thrombus by Definity . Left ventricular ejection fraction, by estimation, is 25 to 30%. Left ventricular ejection fraction by 3D volume is 37 %. The left ventricle has severely decreased function. The left ventricle demonstrates regional wall motion abnormalities (see scoring diagram/findings for description). There is mild left ventricular hypertrophy. Indeterminate diastolic filling due to E-A fusion. The average left ventricular global longitudinal strain is -3.3 %. The global longitudinal  strain is abnormal.  2. Right ventricular systolic function is normal. The right ventricular size is  normal. Tricuspid regurgitation signal is inadequate for assessing PA pressure.  3. The mitral valve is abnormal. No evidence of mitral valve regurgitation. Severe mitral annular calcification.  4. The aortic valve has an indeterminant number of cusps. There is severe calcifcation of the aortic valve. Aortic valve regurgitation is moderate to severe. Severe aortic valve stenosis, low flow-low gradient. Aortic regurgitation PHT measures 231 msec. Aortic valve area, by VTI measures 0.43 cm. Aortic valve mean gradient measures 28.0 mmHg. Aortic valve Vmax measures 3.48 m/s. DVI is 0.22 and SVI are 15.8, worse compared to prior echo.  5. The inferior vena cava is normal in size with greater than 50% respiratory variability, suggesting right atrial pressure of 3 mmHg. Comparison(s): Changes from prior study are noted. LVEF worsened. Severe AS (DVI and SVI worsened) and moderate to severe AI. FINDINGS  Left Ventricle: No LV thrombus by Definity . Left ventricular ejection fraction, by estimation, is 25 to 30%. Left ventricular ejection fraction by 3D volume is 37 %. The left ventricle has severely decreased function. The left ventricle demonstrates regional wall motion abnormalities. The average left ventricular global longitudinal strain is -3.3 %. Strain was performed and  the global longitudinal strain is abnormal. The left ventricular internal cavity size was normal in size. There is mild left ventricular hypertrophy. Abnormal (paradoxical) septal motion, consistent with left bundle branch block. Indeterminate diastolic filling due to E-A fusion.  LV Wall Scoring: The entire anterior wall, entire septum, and apex are akinetic. The entire lateral wall and entire inferior wall are hypokinetic. Right Ventricle: The right ventricular size is normal. No increase in right ventricular wall thickness. Right ventricular systolic function is normal. Tricuspid regurgitation signal is inadequate for assessing PA pressure. Left  Atrium: Left atrial size was normal in size. Right Atrium: Right atrial size was normal in size. Pericardium: There is no evidence of pericardial effusion. Mitral Valve: The mitral valve is abnormal. Severe mitral annular calcification. No evidence of mitral valve regurgitation. Tricuspid Valve: The tricuspid valve is normal in structure. Tricuspid valve regurgitation is not demonstrated. No evidence of tricuspid stenosis. Aortic Valve: The aortic valve has an indeterminant number of cusps. There is severe calcifcation of the aortic valve. Aortic valve regurgitation is moderate to severe. Aortic regurgitation PHT measures 231 msec. Severe aortic stenosis is present. Aortic  valve mean gradient measures 28.0 mmHg. Aortic valve peak gradient measures 48.4 mmHg. Aortic valve area, by VTI measures 0.43 cm. Pulmonic Valve: The pulmonic valve was normal in structure. Pulmonic valve regurgitation is not visualized. No evidence of pulmonic stenosis. Aorta: The aortic root is normal in size and structure. Venous: The inferior vena cava is normal in size with greater than 50% respiratory variability, suggesting right atrial pressure of 3 mmHg. IAS/Shunts: No atrial level shunt detected by color flow Doppler. Additional Comments: 3D was performed not requiring image post processing on an independent workstation and was abnormal.  LEFT VENTRICLE PLAX 2D LVIDd:         4.50 cm LVIDs:         3.60 cm         2D Longitudinal LV PW:         1.20 cm         Strain LV IVS:        1.30 cm         2D Strain GLS   -3.3 % LVOT diam:     1.60 cm         Avg: LV SV:         30 LV SV Index:   16              3D Volume EF LVOT Area:     2.01 cm        LV 3D EF:    Left                                             ventricul                                             ar LV Volumes (MOD)                            ejection LV vol d, MOD    97.2 ml  fraction A4C:                                        by 3D LV vol s, MOD     59.3 ml                    volume is A4C:                                        37 %. LV SV MOD A4C:   97.2 ml                                 3D Volume EF:                                3D EF:        37 %                                LV EDV:       145 ml                                LV ESV:       91 ml                                LV SV:        54 ml RIGHT VENTRICLE TAPSE (M-mode): 2.5 cm LEFT ATRIUM           Index        RIGHT ATRIUM           Index LA diam:      3.60 cm 1.90 cm/m   RA Area:     13.60 cm LA Vol (A4C): 62.3 ml 32.89 ml/m  RA Volume:   35.00 ml  18.48 ml/m  AORTIC VALVE AV Area (Vmax):    0.45 cm AV Area (Vmean):   0.46 cm AV Area (VTI):     0.43 cm AV Vmax:           348.00 cm/s AV Vmean:          249.000 cm/s AV VTI:            0.698 m AV Peak Grad:      48.4 mmHg AV Mean Grad:      28.0 mmHg LVOT Vmax:         77.50 cm/s LVOT Vmean:        57.300 cm/s LVOT VTI:          0.151 m LVOT/AV VTI ratio: 0.22 AI PHT:            231 msec  AORTA Ao Root diam: 3.50 cm MITRAL VALVE MV Area (PHT): 5.38 cm     SHUNTS MV Decel Time: 141 msec     Systemic VTI:  0.15 m MV E velocity: 190.00 cm/s  Systemic Diam: 1.60 cm Vishnu Priya Mallipeddi Electronically signed by Diannah Late Mallipeddi Signature Date/Time:  05/03/2024/3:58:52 PM    Final     Microbiology Recent Results (from the past 240 hours)  Resp panel by RT-PCR (RSV, Flu A&B, Covid) Anterior Nasal Swab     Status: None   Collection Time: 30-May-2024  4:26 AM   Specimen: Anterior Nasal Swab  Result Value Ref Range Status   SARS Coronavirus 2 by RT PCR NEGATIVE NEGATIVE Final    Comment: (NOTE) SARS-CoV-2 target nucleic acids are NOT DETECTED.  The SARS-CoV-2 RNA is generally detectable in upper respiratory specimens during the acute phase of infection. The lowest concentration of SARS-CoV-2 viral copies this assay can detect is 138 copies/mL. A negative result does not preclude SARS-Cov-2 infection and should not be used as  the sole basis for treatment or other patient management decisions. A negative result may occur with  improper specimen collection/handling, submission of specimen other than nasopharyngeal swab, presence of viral mutation(s) within the areas targeted by this assay, and inadequate number of viral copies(<138 copies/mL). A negative result must be combined with clinical observations, patient history, and epidemiological information. The expected result is Negative.  Fact Sheet for Patients:  bloggercourse.com  Fact Sheet for Healthcare Providers:  seriousbroker.it  This test is no t yet approved or cleared by the [place]  FDA and  has been authorized for detection and/or diagnosis of SARS-CoV-2 by FDA under an Emergency Use Authorization (EUA). This EUA will remain  in effect (meaning this test can be used) for the duration of the COVID-19 declaration under Section 564(b)(1) of the Act, 21 U.S.C.section 360bbb-3(b)(1), unless the authorization is terminated  or revoked sooner.       Influenza A by PCR NEGATIVE NEGATIVE Final   Influenza B by PCR NEGATIVE NEGATIVE Final    Comment: (NOTE) The Xpert Xpress SARS-CoV-2/FLU/RSV plus assay is intended as an aid in the diagnosis of influenza from Nasopharyngeal swab specimens and should not be used as a sole basis for treatment. Nasal washings and aspirates are unacceptable for Xpert Xpress SARS-CoV-2/FLU/RSV testing.  Fact Sheet for Patients: bloggercourse.com  Fact Sheet for Healthcare Providers: seriousbroker.it  This test is not yet approved or cleared by the [place]  FDA and has been authorized for detection and/or diagnosis of SARS-CoV-2 by FDA under an Emergency Use Authorization (EUA). This EUA will remain in effect (meaning this test can be used) for the duration of the COVID-19 declaration under Section 564(b)(1) of  the Act, 21 U.S.C. section 360bbb-3(b)(1), unless the authorization is terminated or revoked.     Resp Syncytial Virus by PCR NEGATIVE NEGATIVE Final    Comment: (NOTE) Fact Sheet for Patients: bloggercourse.com  Fact Sheet for Healthcare Providers: seriousbroker.it  This test is not yet approved or cleared by the [place]  FDA and has been authorized for detection and/or diagnosis of SARS-CoV-2 by FDA under an Emergency Use Authorization (EUA). This EUA will remain in effect (meaning this test can be used) for the duration of the COVID-19 declaration under Section 564(b)(1) of the Act, 21 U.S.C. section 360bbb-3(b)(1), unless the authorization is terminated or revoked.  Performed at [facility], [address], [place], [place] [address]   Blood culture (routine x 2)     Status: None (Preliminary result)   Collection Time: 2024/05/30  5:00 AM   Specimen: BLOOD RIGHT ARM  Result Value Ref Range Status   Specimen Description BLOOD RIGHT ARM  Final   Special Requests   Final    BOTTLES DRAWN AEROBIC AND ANAEROBIC Blood Culture adequate volume Performed  at [facility], [address] [place], [place] [address]    Culture PENDING  Incomplete   Report Status PENDING  Incomplete  Blood culture (routine x 2)     Status: None (Preliminary result)   Collection Time: 06-28-2024  5:11 AM   Specimen: BLOOD RIGHT ARM  Result Value Ref Range Status   Specimen Description BLOOD RIGHT ARM  Final   Special Requests   Final    BOTTLES DRAWN AEROBIC AND ANAEROBIC Blood Culture adequate volume Performed at [facility], [address], [place], [place] [address]    Culture PENDING  Incomplete   Report Status PENDING  Incomplete    Lab Basic Metabolic Panel: Recent Labs  Lab June 28, 2024 0426  NA 136  K 3.8  CL 97*  CO2 26  GLUCOSE 239*  BUN 14  CREATININE 1.00  CALCIUM 8.9   Liver Function Tests: No results for input(s): AST,  ALT, ALKPHOS, BILITOT, PROT, ALBUMIN in the last 168 hours. No results for input(s): LIPASE, AMYLASE in the last 168 hours. No results for input(s): AMMONIA in the last 168 hours. CBC: Recent Labs  Lab 06-28-24 0426  WBC 31.9*  HGB 13.5  HCT 43.6  MCV 88.1  PLT 507*   Cardiac Enzymes: No results for input(s): CKTOTAL, CKMB, CKMBINDEX, TROPONINI in the last 168 hours. Sepsis Labs: Recent Labs  Lab Jun 28, 2024 0426 28-Jun-2024 0511  WBC 31.9*  --   LATICACIDVEN  --  5.3*    Procedures/Operations   Time spent: 28 mins Rhandi Despain Jun 28, 2024, 11:22 AM   "

## 2024-06-26 NOTE — IPAL (Signed)
" °  Interdisciplinary Goals of Care Family Meeting   Date carried out: 06-10-2024  Location of the meeting: Unit  Member's involved: Physician, Bedside Registered Nurse, and Family Member or next of kin  Durable Power of Attorney or acting medical decision maker: daughter and spouse present    Discussion: We discussed goals of care for Calvin Moreno .  We discussed daughters conversation with ED provider Dr. Geroldine and cardiologist Dr. Ladona.  We discussed patient's current condition as a palliative oncology patient with critical aortic stenosis with severe cardiomyopathy and now with new findings on EKG suggesting acute inferior STEMI and respiratory distress.  Cardiology felt he would not be a good candidate proceed with cardiac catheterization at this point in recommended palliative care with comfort measures at to be more appropriate given patient is critically ill frail with poor prognosis.  Patient is now BiPAP dependent and remains in respiratory distress.  We have discussed the options to transition him to full comfort measures for comfort and dignity care in the hospital with a continuous IV morphine  infusion and something for his anxiety and liberalize visitation for the family.  Given his severe illness I do not think he will be able to tolerate a transfer to an inpatient hospice facility and we are anticipating hospital death.  Code status:   Code Status: Limited: Do not attempt resuscitation (DNR) -DNR-LIMITED -Do Not Intubate/DNI    Disposition: In-patient comfort care  Time spent for the meeting: 30 mins   Lewis Keats, MD  June 10, 2024, 8:50 AM   "

## 2024-06-26 NOTE — ED Notes (Signed)
"  Xr at bedside  "

## 2024-06-26 NOTE — Progress Notes (Signed)
 Patient passed comfortably with family at bedside.  Time of Death 1000.  [facility]link notified to call honor bridge and postmortem checklist started.

## 2024-06-26 DEATH — deceased
